# Patient Record
Sex: Female | Born: 1945 | Race: White | Hispanic: No | Marital: Married | Smoking: Never smoker
Health system: Southern US, Community
[De-identification: ages and names within clinical notes are randomized; demographics above are authoritative.]

## PROBLEM LIST (undated history)

## (undated) DIAGNOSIS — M779 Enthesopathy, unspecified: Secondary | ICD-10-CM

## (undated) DIAGNOSIS — K219 Gastro-esophageal reflux disease without esophagitis: Secondary | ICD-10-CM

## (undated) DIAGNOSIS — R011 Cardiac murmur, unspecified: Secondary | ICD-10-CM

## (undated) DIAGNOSIS — E785 Hyperlipidemia, unspecified: Secondary | ICD-10-CM

## (undated) DIAGNOSIS — M1990 Unspecified osteoarthritis, unspecified site: Secondary | ICD-10-CM

## (undated) DIAGNOSIS — I10 Essential (primary) hypertension: Secondary | ICD-10-CM

## (undated) DIAGNOSIS — Z5189 Encounter for other specified aftercare: Secondary | ICD-10-CM

## (undated) DIAGNOSIS — Z9189 Other specified personal risk factors, not elsewhere classified: Secondary | ICD-10-CM

## (undated) DIAGNOSIS — Z91B Personal risk factor of exposure to diethylstilbestrol: Secondary | ICD-10-CM

## (undated) DIAGNOSIS — E6609 Other obesity due to excess calories: Secondary | ICD-10-CM

## (undated) DIAGNOSIS — G709 Myoneural disorder, unspecified: Secondary | ICD-10-CM

## (undated) DIAGNOSIS — R519 Headache, unspecified: Secondary | ICD-10-CM

## (undated) DIAGNOSIS — T7840XA Allergy, unspecified, initial encounter: Secondary | ICD-10-CM

## (undated) DIAGNOSIS — Q221 Congenital pulmonary valve stenosis: Secondary | ICD-10-CM

## (undated) DIAGNOSIS — M8580 Other specified disorders of bone density and structure, unspecified site: Secondary | ICD-10-CM

## (undated) DIAGNOSIS — H269 Unspecified cataract: Secondary | ICD-10-CM

## (undated) HISTORY — DX: Gastro-esophageal reflux disease without esophagitis: K21.9

## (undated) HISTORY — DX: Enthesopathy, unspecified: M77.9

## (undated) HISTORY — PX: KNEE ARTHROSCOPY: SHX127

## (undated) HISTORY — DX: Myoneural disorder, unspecified: G70.9

## (undated) HISTORY — DX: Hyperlipidemia, unspecified: E78.5

## (undated) HISTORY — DX: Encounter for other specified aftercare: Z51.89

## (undated) HISTORY — PX: BREAST SURGERY: SHX581

## (undated) HISTORY — DX: Other obesity due to excess calories: E66.09

## (undated) HISTORY — DX: Personal risk factor of exposure to diethylstilbestrol: Z91.B

## (undated) HISTORY — DX: Allergy, unspecified, initial encounter: T78.40XA

## (undated) HISTORY — PX: FRACTURE SURGERY: SHX138

## (undated) HISTORY — DX: Essential (primary) hypertension: I10

## (undated) HISTORY — DX: Unspecified osteoarthritis, unspecified site: M19.90

## (undated) HISTORY — PX: CARDIAC CATHETERIZATION: SHX172

## (undated) HISTORY — PX: OOPHORECTOMY: SHX86

## (undated) HISTORY — PX: COSMETIC SURGERY: SHX468

## (undated) HISTORY — DX: Cardiac murmur, unspecified: R01.1

## (undated) HISTORY — DX: Other specified personal risk factors, not elsewhere classified: Z91.89

## (undated) HISTORY — DX: Unspecified cataract: H26.9

## (undated) HISTORY — PX: OTHER SURGICAL HISTORY: SHX169

## (undated) HISTORY — PX: APPENDECTOMY: SHX54

## (undated) HISTORY — DX: Congenital pulmonary valve stenosis: Q22.1

## (undated) HISTORY — DX: Other specified disorders of bone density and structure, unspecified site: M85.80

---

## 1981-02-07 HISTORY — PX: ABDOMINAL HYSTERECTOMY: SHX81

## 2006-01-15 ENCOUNTER — Ambulatory Visit: Payer: Self-pay | Admitting: Sports Medicine

## 2006-03-05 ENCOUNTER — Ambulatory Visit: Payer: Self-pay | Admitting: Sports Medicine

## 2006-06-08 ENCOUNTER — Ambulatory Visit: Payer: Self-pay | Admitting: Sports Medicine

## 2006-06-09 ENCOUNTER — Encounter: Admission: RE | Admit: 2006-06-09 | Discharge: 2006-06-09 | Payer: Self-pay | Admitting: Sports Medicine

## 2010-10-20 ENCOUNTER — Ambulatory Visit: Payer: Self-pay | Admitting: Cardiology

## 2010-10-28 ENCOUNTER — Encounter: Admission: RE | Admit: 2010-10-28 | Discharge: 2010-10-28 | Payer: Self-pay | Admitting: Cardiology

## 2010-10-28 ENCOUNTER — Encounter: Payer: Self-pay | Admitting: Cardiology

## 2010-10-28 ENCOUNTER — Ambulatory Visit (HOSPITAL_COMMUNITY): Admission: RE | Admit: 2010-10-28 | Discharge: 2010-10-28 | Payer: Self-pay | Admitting: Cardiology

## 2010-10-28 ENCOUNTER — Ambulatory Visit: Payer: Self-pay | Admitting: Cardiology

## 2010-10-28 ENCOUNTER — Ambulatory Visit: Payer: Self-pay

## 2010-10-28 DIAGNOSIS — Q221 Congenital pulmonary valve stenosis: Secondary | ICD-10-CM

## 2010-10-28 HISTORY — DX: Congenital pulmonary valve stenosis: Q22.1

## 2010-11-27 ENCOUNTER — Ambulatory Visit: Payer: Self-pay | Admitting: Cardiology

## 2011-08-14 ENCOUNTER — Encounter: Payer: Self-pay | Admitting: *Deleted

## 2011-08-17 ENCOUNTER — Ambulatory Visit (INDEPENDENT_AMBULATORY_CARE_PROVIDER_SITE_OTHER): Payer: BC Managed Care – PPO | Admitting: Cardiology

## 2011-08-17 ENCOUNTER — Encounter: Payer: Self-pay | Admitting: Cardiology

## 2011-08-17 VITALS — BP 130/78 | HR 78 | Wt 202.0 lb

## 2011-08-17 DIAGNOSIS — Q221 Congenital pulmonary valve stenosis: Secondary | ICD-10-CM

## 2011-08-17 DIAGNOSIS — N318 Other neuromuscular dysfunction of bladder: Secondary | ICD-10-CM

## 2011-08-17 DIAGNOSIS — I119 Hypertensive heart disease without heart failure: Secondary | ICD-10-CM

## 2011-08-17 DIAGNOSIS — E7800 Pure hypercholesterolemia, unspecified: Secondary | ICD-10-CM

## 2011-08-17 DIAGNOSIS — N3281 Overactive bladder: Secondary | ICD-10-CM

## 2011-08-17 MED ORDER — METOPROLOL TARTRATE 25 MG PO TABS: ORAL_TABLET | ORAL | Status: DC

## 2011-08-17 MED ORDER — TRIAMTERENE-HCTZ 37.5-25 MG PO CAPS: 1.0000 | ORAL_CAPSULE | ORAL | Status: DC

## 2011-08-17 NOTE — Assessment & Plan Note (Signed)
The patient is on Crestor 20 mg daily for her hypercholesterolemia.  She is not experiencing any myalgias or other side effects from the Crestor.

## 2011-08-17 NOTE — Assessment & Plan Note (Signed)
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  She denies chest pain or shortness of breath.  No dizziness or syncope.  No TIA symptoms.

## 2011-08-17 NOTE — Progress Notes (Signed)
Heather Hoover Date of Birth:  01-16-1946 [facility] [address]   [address] [place], [place]  [address] [phone]           Fax   [phone]  HPI: This pleasant 65 year old Caucasian female is seen for a scheduled followup office visit.  We were unable to locate Heather Hoover old office chart today.  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  She has been on triamterene and bystolicWhich have been working well but the bystolic is too expensive for Heather Hoover and she is requesting something less expensive.  She has not been having any chest pain or shortness of breath.  Current Outpatient Prescriptions  Medication Sig Dispense Refill  . aspirin 81 MG tablet Take 81 mg by mouth daily.        . Cholecalciferol (VITAMIN D-3 PO) Take by mouth. Taking 2000 daily       . Estradiol 10 MCG TABS Place vaginally. As directed       . Psyllium (METAMUCIL PO) Take by mouth.        . rosuvastatin (CRESTOR) 20 MG tablet Take 20 mg by mouth daily.        Marland Kitchen tolterodine (DETROL LA) 4 MG 24 hr capsule Take 4 mg by mouth daily.        Marland Kitchen triamterene-hydrochlorothiazide (DYAZIDE) 37.5-25 MG per capsule Take 1 capsule by mouth every morning.  90 capsule  3  . metoprolol tartrate (LOPRESSOR) 25 MG tablet Take one tablet daily  90 tablet  3    No Known Allergies  There is no problem list on file for this patient.   History  Smoking status  . Never Smoker   Smokeless tobacco  . Not on file    History  Alcohol Use: Not on file    No family history on file.  Review of Systems: The patient denies any heat or cold intolerance.  No weight gain or weight loss.  The patient denies headaches or blurry vision.  There is no cough or sputum production.  The patient denies dizziness.  There is no hematuria or hematochezia.  The patient denies any muscle aches or arthritis.  The patient denies any rash.  The patient denies frequent  falling or instability.  There is no history of depression or anxiety.  All other systems were reviewed and are negative.   Physical Exam: Filed Vitals:   08/17/11 1406  BP: 130/78  Pulse: 78   The general appearance reveals a well-developed well-nourished woman in no distress.Pupils equal and reactive.   Extraocular Movements are full.  There is no scleral icterus.  The mouth and pharynx are normal.  The neck is supple.  The carotids reveal no bruits.  The jugular venous pressure is normal.  The thyroid is not enlarged.  There is no lymphadenopathy.  The chest is clear to percussion and auscultation. There are no rales or rhonchi. Expansion of the chest is symmetrical.    Heart reveals a grade 2/6 systolic ejection murmur across the pulmonic valve.  No diastolic murmur.The abdomen is soft and nontender. Bowel sounds are normal. The liver and spleen are not enlarged. There Are no abdominal masses. There are no bruits.  The pedal pulses are good.  There is no phlebitis or edema.  There is no cyanosis or clubbing.  Strength is normal and symmetrical in all extremities.  There is no lateralizing weakness.  There are no  sensory deficits.     Assessment / Plan: Began metoprolol tartrate 25 mg one daily.  Continue generic Maxide.  Recheck in 6 months for a followup office visit and fasting lipid panel hepatic function panel and basal metabolic.  Continue prudent heart healthy diet and weight loss.

## 2012-02-11 ENCOUNTER — Ambulatory Visit (INDEPENDENT_AMBULATORY_CARE_PROVIDER_SITE_OTHER): Payer: BC Managed Care – PPO | Admitting: *Deleted

## 2012-02-11 DIAGNOSIS — I119 Hypertensive heart disease without heart failure: Secondary | ICD-10-CM

## 2012-02-11 DIAGNOSIS — E7800 Pure hypercholesterolemia, unspecified: Secondary | ICD-10-CM

## 2012-02-11 LAB — HEPATIC FUNCTION PANEL
ALT: 24 U/L (ref 0–35)
Albumin: 4.3 g/dL (ref 3.5–5.2)
Alkaline Phosphatase: 64 U/L (ref 39–117)
Bilirubin, Direct: 0 mg/dL (ref 0.0–0.3)
Total Protein: 7.6 g/dL (ref 6.0–8.3)

## 2012-02-11 LAB — BASIC METABOLIC PANEL
BUN: 17 mg/dL (ref 6–23)
Calcium: 9.5 mg/dL (ref 8.4–10.5)
Creatinine, Ser: 0.8 mg/dL (ref 0.4–1.2)
GFR: 75.5 mL/min (ref >60.00)

## 2012-02-11 LAB — LIPID PANEL
Cholesterol: 182 mg/dL (ref 0–200)
Triglycerides: 150 mg/dL — ABNORMAL HIGH (ref 0.0–149.0)

## 2012-02-11 NOTE — Progress Notes (Signed)
Quick Note:  Please make copy of labs for patient visit. ______ 

## 2012-02-12 ENCOUNTER — Encounter: Payer: Self-pay | Admitting: *Deleted

## 2012-02-12 ENCOUNTER — Other Ambulatory Visit: Payer: BC Managed Care – PPO

## 2012-02-12 DIAGNOSIS — E6609 Other obesity due to excess calories: Secondary | ICD-10-CM | POA: Insufficient documentation

## 2012-02-12 DIAGNOSIS — M1990 Unspecified osteoarthritis, unspecified site: Secondary | ICD-10-CM | POA: Insufficient documentation

## 2012-02-12 DIAGNOSIS — E785 Hyperlipidemia, unspecified: Secondary | ICD-10-CM | POA: Insufficient documentation

## 2012-02-12 DIAGNOSIS — Q221 Congenital pulmonary valve stenosis: Secondary | ICD-10-CM | POA: Insufficient documentation

## 2012-02-16 ENCOUNTER — Encounter: Payer: Self-pay | Admitting: Cardiology

## 2012-02-16 ENCOUNTER — Ambulatory Visit (INDEPENDENT_AMBULATORY_CARE_PROVIDER_SITE_OTHER): Payer: BC Managed Care – PPO | Admitting: Cardiology

## 2012-02-16 VITALS — BP 138/80 | HR 78 | Ht 66.0 in | Wt 196.0 lb

## 2012-02-16 DIAGNOSIS — I119 Hypertensive heart disease without heart failure: Secondary | ICD-10-CM

## 2012-02-16 DIAGNOSIS — Q221 Congenital pulmonary valve stenosis: Secondary | ICD-10-CM

## 2012-02-16 DIAGNOSIS — E7800 Pure hypercholesterolemia, unspecified: Secondary | ICD-10-CM

## 2012-02-16 MED ORDER — ROSUVASTATIN CALCIUM 20 MG PO TABS: 20.0000 mg | ORAL_TABLET | Freq: Every day | ORAL | Status: DC

## 2012-02-16 NOTE — Assessment & Plan Note (Signed)
The patient has not been expressing any symptoms from her pulmonic stenosis.  She has not been having any signs or symptoms of right heart failure.

## 2012-02-16 NOTE — Progress Notes (Signed)
Beatris Ship Date of Birth:  [birth year] [facility] [address] [place], [place]  [address] [phone]  Fax   [phone]  HPI: This pleasant 66 year old woman is seen for a scheduled followup office visit.  She has a history of congenital pulmonic stenosis.  She has a history of blood pressure and high cholesterol.  Since last visit she has been feeling well.  Current Outpatient Prescriptions  Medication Sig Dispense Refill  . aspirin 81 MG tablet Take 81 mg by mouth daily.        . Cholecalciferol (VITAMIN D-3 PO) Take by mouth. Taking 2000 daily       . Estradiol 10 MCG TABS Place vaginally. As directed       . metoprolol tartrate (LOPRESSOR) 25 MG tablet Take one tablet daily  90 tablet  3  . Psyllium (METAMUCIL PO) Take by mouth.        . rosuvastatin (CRESTOR) 20 MG tablet Take 1 tablet (20 mg total) by mouth daily.  90 tablet  3  . tolterodine (DETROL LA) 4 MG 24 hr capsule Take 4 mg by mouth daily.        Marland Kitchen triamterene-hydrochlorothiazide (DYAZIDE) 37.5-25 MG per capsule Take 1 capsule by mouth every morning.  90 capsule  3    Allergies  Allergen Reactions  . Advicor     Flushing,itching,syncope    Patient Active Problem List  Diagnoses  . Pulmonic stenosis, congenital  . Hypercholesterolemia  . Benign hypertensive heart disease without heart failure  . 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  . Exogenous obesity  . Dyslipidemia  . Arthritis    History  Smoking status  . Never Smoker   Smokeless tobacco  . Not on file    History  Alcohol Use: Not on file    No family history on file.  Review of Systems: The patient denies any heat or cold intolerance.  No weight gain or weight loss.  The patient denies headaches or blurry vision.  There is no cough or sputum production.  The patient denies dizziness.  There is no hematuria or hematochezia.  The patient denies any muscle aches or arthritis.  The patient denies any  rash.  The patient denies frequent falling or instability.  There is no history of depression or anxiety.  All other systems were reviewed and are negative.   Physical Exam: Filed Vitals:   02/16/12 0939  BP: 138/80  Pulse: 78   the general appearance reveals a well-developed well-nourished woman in no distress.Pupils equal and reactive.   Extraocular Movements are full.  There is no scleral icterus.  The mouth and pharynx are normal.  The neck is supple.  The carotids reveal no bruits.  The jugular venous pressure is normal.  The thyroid is not enlarged.  There is no lymphadenopathy.  The chest is clear to percussion and auscultation. There are no rales or rhonchi. Expansion of the chest is symmetrical.  Heart reveals a soft systolic ejection murmur with a ejection click along the pulmonic area.  No gallop or rub.The abdomen is soft and nontender. Bowel sounds are normal. The liver and spleen are not enlarged. There Are no abdominal masses. There are no bruits.  The pedal pulses are good.  There is no phlebitis or edema.  There is no cyanosis or clubbing. Neurologic is no abnormality.The skin is warm and dry.  There is no rash.     Assessment / Plan: Continue same medication.  Recheck  in 6 months for followup office visit EKG and fasting lipid panel and chemistries.  Her weight is down 6 pounds since last visit I encouraged her to continue to lose weight through careful dieting

## 2012-02-16 NOTE — Patient Instructions (Signed)
Your physician wants you to follow-up in:  6 months. You will receive a reminder letter in the mail two months in advance. If you don't receive a letter, please call our office to schedule the follow-up appointment.   

## 2012-02-16 NOTE — Assessment & Plan Note (Signed)
Patient is on Crestor for her hypercholesterolemia.  She is not having any symptoms or myalgias.  We are refilling her Crestor today.  We reviewed her recent lab work which is satisfactory including normal liver function studies

## 2012-04-14 ENCOUNTER — Ambulatory Visit: Payer: BC Managed Care – PPO | Admitting: Gynecology

## 2012-04-18 ENCOUNTER — Ambulatory Visit (INDEPENDENT_AMBULATORY_CARE_PROVIDER_SITE_OTHER): Payer: BC Managed Care – PPO | Admitting: Gynecology

## 2012-04-18 ENCOUNTER — Encounter: Payer: Self-pay | Admitting: Gynecology

## 2012-04-18 VITALS — BP 158/80 | Ht 65.75 in | Wt 198.0 lb

## 2012-04-18 DIAGNOSIS — Z780 Asymptomatic menopausal state: Secondary | ICD-10-CM

## 2012-04-18 DIAGNOSIS — N318 Other neuromuscular dysfunction of bladder: Secondary | ICD-10-CM

## 2012-04-18 DIAGNOSIS — N952 Postmenopausal atrophic vaginitis: Secondary | ICD-10-CM

## 2012-04-18 DIAGNOSIS — Z01419 Encounter for gynecological examination (general) (routine) without abnormal findings: Secondary | ICD-10-CM

## 2012-04-18 DIAGNOSIS — N3941 Urge incontinence: Secondary | ICD-10-CM

## 2012-04-18 DIAGNOSIS — N3281 Overactive bladder: Secondary | ICD-10-CM

## 2012-04-18 MED ORDER — TOLTERODINE TARTRATE ER 4 MG PO CP24: 4.0000 mg | ORAL_CAPSULE | Freq: Every day | ORAL | Status: DC

## 2012-04-18 MED ORDER — ESTRADIOL 10 MCG VA TABS: 10.0000 ug | ORAL_TABLET | VAGINAL | Status: DC

## 2012-04-18 NOTE — Progress Notes (Signed)
Heather Hoover [birth year] [id number]        66 y.o.  New patient for annual exam.  Former patient of Dr. Kyra Manges. Several issues noted below.  Past medical history,surgical history, medications, allergies, family history and social history were all reviewed and documented in the EPIC chart. ROS:  Was performed and pertinent positives and negatives are included in the history.  Exam: Sherrilyn Rist chaperone present Filed Vitals:   04/18/12 0930  BP: 158/80   General appearance  Normal Skin grossly normal Head/Neck normal with no cervical or supraclavicular adenopathy thyroid normal Lungs  clear Cardiac RR, without 2/6 systolic ejection murmur at left sternal border Abdominal  soft, nontender, without masses, organomegaly or hernia Breasts  examined lying and sitting without masses, retractions, discharge or axillary adenopathy. Pelvic  Ext/BUS/vagina  normal with atrophic genital changes  Adnexa  Without masses or tenderness    Anus and perineum  normal   Rectovaginal  normal sphincter tone without palpated masses or tenderness.    Assessment/Plan:  66 y.o. female for annual exam.    1. Atrophic vaginitis. Patients on Vagifem and has been on this for a number of years. She's doing well with this and wants a refill. I reviewed the issues of absorption and the risks to include stroke heart attack DVT possible breast cancer issue. She does have a history of pulmonic stenosis followed by Dr. [name] and being on this is not an issue per her history. I refilled her Vagifem 10 mcg x1 year. 2. Urge incontinence. Patients on Detrol 4 mg daily. She's done well with this and wants to continue. She has a history consistent with urge symptoms. Will check UA today and refill her Detrol times a year. 3. History endometriosis/endometrioma status post TAH RSO. Has done well since surgery. 4. Pap smear. She had a Pap smear 2012 to include high risk HPV screen which was negative. I reviewed current  screening guidelines to include stopping at age 85 as well as stopping with hysterectomy. She has no history of significant abnormal Pap smears in the past we will plan on stopping Pap smears. 5. Mammography. Had mammogram August 2012. We'll continue with annual mammography. SBE monthly reviewed. 6. Bone health. Had DEXA over 10 years ago. We'll repeat now and she will schedule this. Increase calcium vitamin D reviewed. 7. Colonoscopy. She had her colonoscopy several years ago and is due to repeat at a 10 year interval. 8. Health maintenance. No blood work was done today this is all done through her primary physician's office and Dr. Yevonne Pax office who follows her for her medical issues. Her blood pressure was elevated today which I alerted her to and she states it always is when she sees her physician. Assuming she continues well from a gynecologic standpoint she will see me in a year, sooner as needed.    Dara Lords MD, 10:09 AM 04/18/2012

## 2012-04-18 NOTE — Patient Instructions (Signed)
Followup for bone density as scheduled. 

## 2012-04-19 LAB — URINALYSIS W MICROSCOPIC + REFLEX CULTURE
Bilirubin Urine: NEGATIVE
Casts: NONE SEEN
Crystals: NONE SEEN
Glucose, UA: NEGATIVE mg/dL
Specific Gravity, Urine: 1.02 (ref 1.005–1.030)
Squamous Epithelial / LPF: NONE SEEN
Urobilinogen, UA: 0.2 mg/dL (ref 0.0–1.0)
pH: 5.5 (ref 5.0–8.0)

## 2012-06-02 ENCOUNTER — Ambulatory Visit: Payer: BC Managed Care – PPO

## 2012-06-28 ENCOUNTER — Other Ambulatory Visit: Payer: Self-pay | Admitting: Cardiology

## 2012-06-30 ENCOUNTER — Ambulatory Visit (INDEPENDENT_AMBULATORY_CARE_PROVIDER_SITE_OTHER): Payer: Medicare Other

## 2012-06-30 ENCOUNTER — Encounter: Payer: Self-pay | Admitting: Gynecology

## 2012-06-30 DIAGNOSIS — M899 Disorder of bone, unspecified: Secondary | ICD-10-CM

## 2012-08-17 ENCOUNTER — Other Ambulatory Visit (INDEPENDENT_AMBULATORY_CARE_PROVIDER_SITE_OTHER): Payer: Medicare Other

## 2012-08-17 DIAGNOSIS — I119 Hypertensive heart disease without heart failure: Secondary | ICD-10-CM

## 2012-08-17 DIAGNOSIS — E7800 Pure hypercholesterolemia, unspecified: Secondary | ICD-10-CM

## 2012-08-17 LAB — LIPID PANEL
HDL: 57.2 mg/dL (ref >39.00)
Total CHOL/HDL Ratio: 3

## 2012-08-17 LAB — HEPATIC FUNCTION PANEL
AST: 23 U/L (ref 0–37)
Bilirubin, Direct: 0.1 mg/dL (ref 0.0–0.3)
Total Bilirubin: 0.6 mg/dL (ref 0.3–1.2)

## 2012-08-17 LAB — BASIC METABOLIC PANEL
CO2: 29 mEq/L (ref 19–32)
Calcium: 9.3 mg/dL (ref 8.4–10.5)
Glucose, Bld: 94 mg/dL (ref 70–99)
Potassium: 3.8 mEq/L (ref 3.5–5.1)
Sodium: 138 mEq/L (ref 135–145)

## 2012-08-17 NOTE — Progress Notes (Signed)
Quick Note:  Please make copy of labs for patient visit. ______ 

## 2012-08-19 ENCOUNTER — Encounter: Payer: Self-pay | Admitting: Gynecology

## 2012-08-23 ENCOUNTER — Ambulatory Visit (INDEPENDENT_AMBULATORY_CARE_PROVIDER_SITE_OTHER): Payer: Medicare Other | Admitting: Cardiology

## 2012-08-23 ENCOUNTER — Encounter: Payer: Self-pay | Admitting: Cardiology

## 2012-08-23 VITALS — BP 162/90 | HR 91 | Ht 66.0 in | Wt 195.8 lb

## 2012-08-23 DIAGNOSIS — I119 Hypertensive heart disease without heart failure: Secondary | ICD-10-CM

## 2012-08-23 DIAGNOSIS — E785 Hyperlipidemia, unspecified: Secondary | ICD-10-CM

## 2012-08-23 DIAGNOSIS — Q221 Congenital pulmonary valve stenosis: Secondary | ICD-10-CM

## 2012-08-23 DIAGNOSIS — E7800 Pure hypercholesterolemia, unspecified: Secondary | ICD-10-CM

## 2012-08-23 MED ORDER — METOPROLOL TARTRATE 25 MG PO TABS: 25.0000 mg | ORAL_TABLET | Freq: Every day | ORAL | Status: DC

## 2012-08-23 MED ORDER — ROSUVASTATIN CALCIUM 20 MG PO TABS: 20.0000 mg | ORAL_TABLET | Freq: Every day | ORAL | Status: DC

## 2012-08-23 MED ORDER — TRIAMTERENE-HCTZ 37.5-25 MG PO CAPS: 1.0000 | ORAL_CAPSULE | ORAL | Status: DC

## 2012-08-23 NOTE — Progress Notes (Signed)
Heather Hoover Date of Birth:  1946/06/05 [facility] [address] [place], [place]  [address] [phone]  Fax   [phone]  HPI: This pleasant 66 year old woman is seen for a scheduled followup office visit. She has a history of congenital pulmonic stenosis. She has a history of blood pressure and high cholesterol. Since last visit she has been feeling well.  She has not been having chest pain or shortness of breath.  She has occasional migraine headaches.  She has had some tingling of her left hand and left arm which occurs most nights.  She does not notice it during the day.  It may be related to her mattress.  She recently took a trip to [place] and slept on a hotel mattress and did not have any problems with her neck or her left arm and hand.   Current Outpatient Prescriptions  Medication Sig Dispense Refill  . aspirin 81 MG tablet Take 81 mg by mouth daily.        . Cholecalciferol (VITAMIN D-3 PO) Take by mouth. Taking 2000 daily       . Estradiol (VAGIFEM) 10 MCG TABS Place 1 tablet (10 mcg total) vaginally 2 (two) times a week.  8 tablet  11  . metoprolol tartrate (LOPRESSOR) 25 MG tablet Take 1 tablet (25 mg total) by mouth daily.  90 tablet  3  . polycarbophil (FIBERCON) 625 MG tablet Take 625 mg by mouth daily.      . rosuvastatin (CRESTOR) 20 MG tablet Take 1 tablet (20 mg total) by mouth daily.  90 tablet  3  . tolterodine (DETROL LA) 4 MG 24 hr capsule Take 1 capsule (4 mg total) by mouth daily.  30 capsule  11  . triamterene-hydrochlorothiazide (DYAZIDE) 37.5-25 MG per capsule Take 1 each (1 capsule total) by mouth every morning.  90 capsule  3  . DISCONTD: metoprolol tartrate (LOPRESSOR) 25 MG tablet TAKE 1 TABLET DAILY  90 tablet  2  . DISCONTD: rosuvastatin (CRESTOR) 20 MG tablet Take 1 tablet (20 mg total) by mouth daily.  90 tablet  3  . DISCONTD: triamterene-hydrochlorothiazide (DYAZIDE) 37.5-25 MG per capsule Take 1 capsule by mouth  every morning.  90 capsule  3    Allergies  Allergen Reactions  . Niacin-Lovastatin Er     Flushing,itching,syncope    Patient Active Problem List  Diagnosis  . Pulmonic stenosis, congenital  . Hypercholesterolemia  . Benign hypertensive heart disease without heart failure  . 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  . Exogenous obesity  . Dyslipidemia  . Arthritis    History  Smoking status  . Never Smoker   Smokeless tobacco  . Never Used    History  Alcohol Use  . Yes    rarely    Family History  Problem Relation Age of Onset  . Hypertension Mother   . Breast cancer Mother 51    Breast   . Cancer Mother     uterine thye think  . Heart disease Mother   . Cancer Father 41    prostate metastized to bone  . Hypertension Sister   . Breast cancer Sister   . Cancer Sister 53    uterine    Review of Systems: The patient denies any heat or cold intolerance.  No weight gain or weight loss.  The patient denies headaches or blurry vision.  There is no cough or sputum production.  The patient denies dizziness.  There is  no hematuria or hematochezia.  The patient denies any muscle aches or arthritis.  The patient denies any rash.  The patient denies frequent falling or instability.  There is no history of depression or anxiety.  All other systems were reviewed and are negative.   Physical Exam: Filed Vitals:   08/23/12 1542  BP: 162/90  Pulse: 91   the general appearance reveals a well-developed well-nourished woman in no distress.The head and neck exam reveals pupils equal and reactive.  Extraocular movements are full.  There is no scleral icterus.  The mouth and pharynx are normal.  The neck is supple.  The carotids reveal no bruits.  The jugular venous pressure is normal.  The  thyroid is not enlarged.  There is no lymphadenopathy.  The chest is clear to percussion and auscultation.  There are no rales or rhonchi.  Expansion of the chest is symmetrical.  The  precordium is quiet.  The first heart sound is normal.  The second heart sound is physiologically split.  There is a systolic ejection click followed by a grade 2/6 systolic ejection murmur at the left sternal edge.  There is no abnormal lift or heave.  The abdomen is soft and nontender.  The bowel sounds are normal.  The liver and spleen are not enlarged.  There are no abdominal masses.  There are no abdominal bruits.  Extremities reveal good pedal pulses.  There is no phlebitis or edema.  There is no cyanosis or clubbing.  Strength is normal and symmetrical in all extremities.  There is no lateralizing weakness.  There are no sensory deficits.  The skin is warm and dry.  There is no rash.  EKG today shows normal sinus rhythm and is within normal limits.  No evidence of right heart strain   Assessment / Plan: Continue same medication.  Continue low-cholesterol diet.  Recheck in 6 months for followup office visit fasting lipid panel hepatic function panel and basal metabolic panel.

## 2012-08-23 NOTE — Assessment & Plan Note (Signed)
The patient is not having any symptoms from her pulmonic stenosis.  No symptoms of right heart failure.

## 2012-08-23 NOTE — Assessment & Plan Note (Signed)
Her blood pressure here in the office today is elevated at 140/88 on repeat.  She brought in a list of her blood pressures from home which are excellent.  She will continue same blood pressure medication

## 2012-08-23 NOTE — Assessment & Plan Note (Signed)
The patient has a history of hypercholesterolemia.  Her husband was recently diagnosed with high cholesterol and did not want to go on medication and so he has cut out all cheese from his diet and also from his wife's diet.  As a result her weight is down and her lipids are improved

## 2012-08-23 NOTE — Patient Instructions (Signed)
Your physician recommends that you continue on your current medications as directed. Please refer to the Current Medication list given to you today.  Your physician wants you to follow-up in: 6 month. You will receive a reminder letter in the mail two months in advance. If you don't receive a letter, please call our office to schedule the follow-up appointment.  

## 2012-09-22 ENCOUNTER — Encounter: Payer: Self-pay | Admitting: Gynecology

## 2013-01-03 ENCOUNTER — Other Ambulatory Visit: Payer: Self-pay | Admitting: Family Medicine

## 2013-01-03 DIAGNOSIS — M5412 Radiculopathy, cervical region: Secondary | ICD-10-CM

## 2013-01-07 ENCOUNTER — Ambulatory Visit
Admission: RE | Admit: 2013-01-07 | Discharge: 2013-01-07 | Disposition: A | Discharge status: Home / Self Care | Payer: Medicare PPO | Source: Ambulatory Visit | Attending: Family Medicine | Admitting: Family Medicine

## 2013-01-07 DIAGNOSIS — M5412 Radiculopathy, cervical region: Secondary | ICD-10-CM

## 2013-02-21 ENCOUNTER — Other Ambulatory Visit: Payer: Medicare PPO

## 2013-02-22 ENCOUNTER — Other Ambulatory Visit: Payer: Medicare PPO

## 2013-02-24 ENCOUNTER — Ambulatory Visit: Payer: Medicare PPO | Admitting: Cardiology

## 2013-02-27 ENCOUNTER — Encounter: Payer: Self-pay | Admitting: Gynecology

## 2013-03-06 ENCOUNTER — Ambulatory Visit: Payer: Medicare PPO | Admitting: Cardiology

## 2013-03-15 ENCOUNTER — Other Ambulatory Visit (INDEPENDENT_AMBULATORY_CARE_PROVIDER_SITE_OTHER): Payer: Medicare PPO

## 2013-03-15 DIAGNOSIS — I119 Hypertensive heart disease without heart failure: Secondary | ICD-10-CM

## 2013-03-15 DIAGNOSIS — E7800 Pure hypercholesterolemia, unspecified: Secondary | ICD-10-CM

## 2013-03-15 LAB — HEPATIC FUNCTION PANEL
AST: 27 U/L (ref 0–37)
Albumin: 4.2 g/dL (ref 3.5–5.2)
Alkaline Phosphatase: 58 U/L (ref 39–117)
Bilirubin, Direct: 0 mg/dL (ref 0.0–0.3)
Total Protein: 7.4 g/dL (ref 6.0–8.3)

## 2013-03-15 LAB — BASIC METABOLIC PANEL
BUN: 14 mg/dL (ref 6–23)
CO2: 31 mEq/L (ref 19–32)
Calcium: 9.5 mg/dL (ref 8.4–10.5)
Creatinine, Ser: 0.7 mg/dL (ref 0.4–1.2)

## 2013-03-15 LAB — LIPID PANEL
Cholesterol: 167 mg/dL (ref 0–200)
Triglycerides: 141 mg/dL (ref 0.0–149.0)

## 2013-03-16 NOTE — Progress Notes (Signed)
Quick Note:  Please make copy of labs for patient visit. ______ 

## 2013-03-17 ENCOUNTER — Ambulatory Visit (INDEPENDENT_AMBULATORY_CARE_PROVIDER_SITE_OTHER): Payer: Medicare PPO | Admitting: Cardiology

## 2013-03-17 ENCOUNTER — Encounter: Payer: Self-pay | Admitting: Cardiology

## 2013-03-17 VITALS — BP 148/76 | HR 77 | Ht 66.0 in | Wt 196.4 lb

## 2013-03-17 DIAGNOSIS — I119 Hypertensive heart disease without heart failure: Secondary | ICD-10-CM

## 2013-03-17 DIAGNOSIS — Q221 Congenital pulmonary valve stenosis: Secondary | ICD-10-CM

## 2013-03-17 DIAGNOSIS — E7800 Pure hypercholesterolemia, unspecified: Secondary | ICD-10-CM

## 2013-03-17 DIAGNOSIS — E876 Hypokalemia: Secondary | ICD-10-CM

## 2013-03-17 MED ORDER — METOPROLOL TARTRATE 25 MG PO TABS: 25.0000 mg | ORAL_TABLET | Freq: Every day | ORAL | Status: DC

## 2013-03-17 MED ORDER — HYDROCHLOROTHIAZIDE 25 MG PO TABS: 25.0000 mg | ORAL_TABLET | Freq: Every day | ORAL | Status: DC

## 2013-03-17 MED ORDER — POTASSIUM CHLORIDE ER 10 MEQ PO TBCR: 10.0000 meq | EXTENDED_RELEASE_TABLET | Freq: Every day | ORAL | Status: DC

## 2013-03-17 MED ORDER — ROSUVASTATIN CALCIUM 20 MG PO TABS: 20.0000 mg | ORAL_TABLET | Freq: Every day | ORAL | Status: DC

## 2013-03-17 NOTE — Patient Instructions (Addendum)
START POTASSIUM 10 MEQ DAILY, RX SENT TO [facility]  RETURN IN 2 WEEKS FOR LABS   Your physician wants you to follow-up in: 6 months with fasting labs (lp/bmet/hfp) AND EKG  You will receive a reminder letter in the mail two months in advance. If you don't receive a letter, please call our office to schedule the follow-up appointment.

## 2013-03-17 NOTE — Assessment & Plan Note (Signed)
Blood pressure has been satisfactory but recent lab work shows that she is hypokalemic again.  She presently is on hydrochlorothiazide for blood pressure.  We will add KCL 10 mEq one daily to her regimen.

## 2013-03-17 NOTE — Progress Notes (Signed)
Heather Hoover Date of Birth:  05-01-1946 [facility] [address] [place], [place]  [address] [phone]         Fax   [phone]  History of Present Illness: This pleasant 67 year old woman is seen for a scheduled followup office visit. She has a history of congenital pulmonic stenosis. She has a history of high blood pressure and high cholesterol. Since last visit she has been feeling well. She has not been having chest pain or shortness of breath. She has occasional migraine headaches. She has had some tingling of her left hand and left arm which occurs most nights. She does not notice it during the day. It may be related to her mattress.  She eventually had an MRI which showed bone spurs on her spine.  She had a trial of Celebrex which did not help much.  The discomfort tends to wax and wane on its own and right now she is not having much problem with it.   Current Outpatient Prescriptions  Medication Sig Dispense Refill  . aspirin 81 MG tablet Take 81 mg by mouth daily.        . Cholecalciferol (VITAMIN D-3 PO) Take by mouth. Taking 2000 daily       . Estradiol (VAGIFEM) 10 MCG TABS Place 1 tablet (10 mcg total) vaginally 2 (two) times a week.  8 tablet  11  . hydrochlorothiazide (HYDRODIURIL) 25 MG tablet Take 1 tablet (25 mg total) by mouth daily.  90 tablet  3  . metoprolol tartrate (LOPRESSOR) 25 MG tablet Take 1 tablet (25 mg total) by mouth daily.  90 tablet  3  . polycarbophil (FIBERCON) 625 MG tablet Take 625 mg by mouth daily.      . rosuvastatin (CRESTOR) 20 MG tablet Take 1 tablet (20 mg total) by mouth daily.  90 tablet  3  . tolterodine (DETROL LA) 4 MG 24 hr capsule Take 1 capsule (4 mg total) by mouth daily.  30 capsule  11  . potassium chloride (K-DUR) 10 MEQ tablet Take 1 tablet (10 mEq total) by mouth daily.  90 tablet  3   No current facility-administered medications for this visit.    Allergies  Allergen Reactions  .  Niacin-Lovastatin Er     Flushing,itching,syncope    Patient Active Problem List  Diagnosis  . Pulmonic stenosis, congenital  . Hypercholesterolemia  . Benign hypertensive heart disease without heart failure  . 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  . Exogenous obesity  . Dyslipidemia  . Arthritis    History  Smoking status  . Never Smoker   Smokeless tobacco  . Never Used    History  Alcohol Use  . Yes    Comment: rarely    Family History  Problem Relation Age of Onset  . Hypertension Mother   . Breast cancer Mother 75    Breast   . Cancer Mother     uterine thye think  . Heart disease Mother   . Cancer Father 28    prostate metastized to bone  . Hypertension Sister   . Breast cancer Sister   . Cancer Sister 34    uterine    Review of Systems: Constitutional: no fever chills diaphoresis or fatigue or change in weight.  Head and neck: no hearing loss, no epistaxis, no photophobia or visual disturbance. Respiratory: No cough, shortness of breath or wheezing. Cardiovascular: No chest pain peripheral edema, palpitations. Gastrointestinal: No abdominal distention, no abdominal pain, no  change in bowel habits hematochezia or melena. Genitourinary: No dysuria, no frequency, no urgency, no nocturia. Musculoskeletal:No arthralgias, no back pain, no gait disturbance or myalgias. Neurological: No dizziness, no headaches, no numbness, no seizures, no syncope, no weakness, no tremors. Hematologic: No lymphadenopathy, no easy bruising. Psychiatric: No confusion, no hallucinations, no sleep disturbance.    Physical Exam: Filed Vitals:   03/17/13 1438  BP: 148/76  Pulse: 77   the general appearance reveals a well-developed well-nourished woman in no distress.The head and neck exam reveals pupils equal and reactive.  Extraocular movements are full.  There is no scleral icterus.  The mouth and pharynx are normal.  The neck is supple.  The carotids reveal no  bruits.  The jugular venous pressure is normal.  The  thyroid is not enlarged.  There is no lymphadenopathy.  The chest is clear to percussion and auscultation.  There are no rales or rhonchi.  Expansion of the chest is symmetrical.  The precordium is quiet.  The first heart sound is normal.  The second heart sound is physiologically split.  There is no  gallop rub or click.  There is a grade 2/6 systolic ejection murmur across the pulmonic valve  There is no abnormal lift or heave.  The abdomen is soft and nontender.  The bowel sounds are normal.  The liver and spleen are not enlarged.  There are no abdominal masses.  There are no abdominal bruits.  Extremities reveal good pedal pulses.  There is no phlebitis or edema.  There is no cyanosis or clubbing.  Strength is normal and symmetrical in all extremities.  There is no lateralizing weakness.  There are no sensory deficits.  The skin is warm and dry.  There is no rash.     Assessment / Plan: Her blood pressure is slightly high he was in the office today but at home her blood pressures have been normal and she brought in a short of her home blood pressures showing blood pressures in the range of 130/75. Continue same medication.  At potassium chloride 10 mEq daily.  Recheck in 2 weeks for a followup BMET and recheck in 6 months for office visit EKG and fasting lab work.

## 2013-03-17 NOTE — Assessment & Plan Note (Signed)
The patient is not having any symptoms referable to her mild pulmonic stenosis.  She exercises regularly at the [facility] where she walks on treadmill and does the exercise machines.

## 2013-03-17 NOTE — Assessment & Plan Note (Signed)
Patient has a history of hypercholesterolemia.  Recent lab work is satisfactory.  She is on Crestor which read refill today.  She is not having any side effects from the Crestor.

## 2013-03-31 ENCOUNTER — Other Ambulatory Visit (INDEPENDENT_AMBULATORY_CARE_PROVIDER_SITE_OTHER): Payer: Medicare PPO

## 2013-03-31 DIAGNOSIS — E7800 Pure hypercholesterolemia, unspecified: Secondary | ICD-10-CM

## 2013-03-31 DIAGNOSIS — E876 Hypokalemia: Secondary | ICD-10-CM

## 2013-03-31 LAB — BASIC METABOLIC PANEL
CO2: 29 mEq/L (ref 19–32)
Calcium: 9.1 mg/dL (ref 8.4–10.5)
Creatinine, Ser: 0.6 mg/dL (ref 0.4–1.2)
GFR: 102.43 mL/min (ref >60.00)
Sodium: 134 mEq/L — ABNORMAL LOW (ref 135–145)

## 2013-03-31 NOTE — Progress Notes (Signed)
Quick Note:  Please report to patient. The recent labs are stable. Continue same medication and careful diet. Potassium better. CSD ______

## 2013-04-03 ENCOUNTER — Telehealth: Payer: Self-pay | Admitting: *Deleted

## 2013-04-03 NOTE — Telephone Encounter (Signed)
Advised patient of lab results  

## 2013-04-03 NOTE — Telephone Encounter (Signed)
Message copied by Burnell Blanks on Mon Apr 03, 2013  8:50 AM ------      Message from: Cassell Clement      Created: Fri Mar 31, 2013  1:46 PM       Please report to patient.  The recent labs are stable. Continue same medication and careful diet. Potassium better. CSD ------

## 2013-04-10 ENCOUNTER — Other Ambulatory Visit: Payer: Self-pay

## 2013-04-10 MED ORDER — ESTRADIOL 10 MCG VA TABS: 10.0000 ug | ORAL_TABLET | VAGINAL | Status: DC

## 2013-04-27 ENCOUNTER — Encounter: Payer: Self-pay | Admitting: Gynecology

## 2013-04-27 ENCOUNTER — Ambulatory Visit (INDEPENDENT_AMBULATORY_CARE_PROVIDER_SITE_OTHER): Payer: Medicare PPO | Admitting: Gynecology

## 2013-04-27 VITALS — BP 134/80 | Ht 65.5 in | Wt 190.0 lb

## 2013-04-27 DIAGNOSIS — N3281 Overactive bladder: Secondary | ICD-10-CM

## 2013-04-27 DIAGNOSIS — N318 Other neuromuscular dysfunction of bladder: Secondary | ICD-10-CM

## 2013-04-27 DIAGNOSIS — M8580 Other specified disorders of bone density and structure, unspecified site: Secondary | ICD-10-CM

## 2013-04-27 DIAGNOSIS — M949 Disorder of cartilage, unspecified: Secondary | ICD-10-CM

## 2013-04-27 DIAGNOSIS — N952 Postmenopausal atrophic vaginitis: Secondary | ICD-10-CM

## 2013-04-27 MED ORDER — TOLTERODINE TARTRATE ER 4 MG PO CP24: 4.0000 mg | ORAL_CAPSULE | Freq: Every day | ORAL | Status: DC

## 2013-04-27 MED ORDER — ESTRADIOL 10 MCG VA TABS: 10.0000 ug | ORAL_TABLET | VAGINAL | Status: DC

## 2013-04-27 NOTE — Progress Notes (Signed)
Heather Hoover [birth year] [id number]        67 y.o.  G2P2002 for followup exam.  Several issues noted below.  Past medical history,surgical history, medications, allergies, family history and social history were all reviewed and documented in the EPIC chart. ROS:  Was performed and pertinent positives and negatives are included in the history.  Exam: Kim assistant Filed Vitals:   04/27/13 1418  BP: 134/80  Height: 5' 5.5" (1.664 m)  Weight: 190 lb (86.183 kg)   General appearance  Normal Skin grossly normal Head/Neck normal with no cervical or supraclavicular adenopathy thyroid normal Lungs  clear Cardiac RR, without RMG Abdominal  soft, nontender, without masses, organomegaly or hernia Breasts  examined lying and sitting without masses, retractions, discharge or axillary adenopathy. Pelvic  Ext/BUS/vagina  normal with atrophic changes   Adnexa  Without masses or tenderness    Anus and perineum  normal   Rectovaginal  normal sphincter tone without palpated masses or tenderness.    Assessment/Plan:  67 y.o. [id number] female for annual exam.   1. Atrophic vaginitis. Using Vagifem. Doing well with this and I refilled her time to year. We've previously discussed issues and risks and she is comfortable continuing. 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 I refilled her times a year.  3. Osteopenia.DEXA 06/2012 T score -1.2 FRAX 7.9%/0.6%. Increase calcium vitamin D reviewed. Plan repeat DEXA in 1-2 years for trending.  4. Mammography 07/2012. Had followup 6 month stability checked March 2014. Due to repeat in August and knows to a followup for this. SBE monthly reviewed. 5. Pap smear 2012. No Pap smear done today. No history of significant abnormal Pap smears. She is status post hysterectomy. Age 26. Options to stop screening altogether repeat next year 3 year interval reviewed. We'll readdress next year. 6. Colonoscopy 2010. Repeat at their recommended interval. 7. Health  maintenance. No blood work done as it is all done through her primary physician's office. Followup one year, sooner as needed.    Dara Lords MD, 2:55 PM 04/27/2013

## 2013-04-27 NOTE — Patient Instructions (Signed)
Follow up in one year for annual exam 

## 2013-04-28 LAB — URINALYSIS W MICROSCOPIC + REFLEX CULTURE
Bacteria, UA: NONE SEEN
Bilirubin Urine: NEGATIVE
Ketones, ur: NEGATIVE mg/dL
Nitrite: NEGATIVE
Protein, ur: NEGATIVE mg/dL
Specific Gravity, Urine: 1.012 (ref 1.005–1.030)
Squamous Epithelial / LPF: NONE SEEN
Urobilinogen, UA: 0.2 mg/dL (ref 0.0–1.0)

## 2013-05-03 ENCOUNTER — Encounter: Payer: Self-pay | Admitting: Cardiology

## 2013-09-06 ENCOUNTER — Other Ambulatory Visit (INDEPENDENT_AMBULATORY_CARE_PROVIDER_SITE_OTHER): Payer: Medicare PPO

## 2013-09-06 DIAGNOSIS — E7800 Pure hypercholesterolemia, unspecified: Secondary | ICD-10-CM

## 2013-09-06 DIAGNOSIS — E876 Hypokalemia: Secondary | ICD-10-CM

## 2013-09-06 LAB — BASIC METABOLIC PANEL
BUN: 17 mg/dL (ref 6–23)
Calcium: 9.4 mg/dL (ref 8.4–10.5)
GFR: 98.61 mL/min (ref >60.00)
Glucose, Bld: 93 mg/dL (ref 70–99)

## 2013-09-06 LAB — HEPATIC FUNCTION PANEL
ALT: 24 U/L (ref 0–35)
AST: 28 U/L (ref 0–37)
Alkaline Phosphatase: 56 U/L (ref 39–117)
Bilirubin, Direct: 0.1 mg/dL (ref 0.0–0.3)
Total Bilirubin: 0.5 mg/dL (ref 0.3–1.2)

## 2013-09-06 NOTE — Progress Notes (Signed)
Quick Note:  Please make copy of labs for patient visit. ______ 

## 2013-09-13 ENCOUNTER — Encounter: Payer: Self-pay | Admitting: Cardiology

## 2013-09-13 ENCOUNTER — Ambulatory Visit (INDEPENDENT_AMBULATORY_CARE_PROVIDER_SITE_OTHER): Payer: Medicare PPO | Admitting: Cardiology

## 2013-09-13 VITALS — BP 153/81 | HR 72 | Ht 66.0 in | Wt 197.0 lb

## 2013-09-13 DIAGNOSIS — E7800 Pure hypercholesterolemia, unspecified: Secondary | ICD-10-CM

## 2013-09-13 DIAGNOSIS — I119 Hypertensive heart disease without heart failure: Secondary | ICD-10-CM

## 2013-09-13 DIAGNOSIS — Q221 Congenital pulmonary valve stenosis: Secondary | ICD-10-CM

## 2013-09-13 NOTE — Assessment & Plan Note (Signed)
The patient has a history of hypercholesterolemia and dyslipidemia.  Her husband is on a vegan diet and so Heather Hoover has been on a similar diet and has been eating a lot of carbohydrates and starches.  This is causing her weight and her triglycerides to increase.  She will cut back further on carbs and observe response

## 2013-09-13 NOTE — Assessment & Plan Note (Signed)
The patient has not had any chest pain or shortness of breath.  She goes to the [facility] 3 days a week and works out.  Her energy level and stamina are normal

## 2013-09-13 NOTE — Progress Notes (Signed)
Heather Hoover Date of Birth:  01-Feb-1946 [facility] [address] [place], [place]  [address] [phone]         Fax   [phone]  History of Present Illness: This pleasant 67 year old woman is seen for a scheduled followup office visit. She has a history of congenital pulmonic stenosis. She has a history of high blood pressure and high cholesterol. Since last visit she has been feeling well. She has not been having chest pain or shortness of breath. She has occasional migraine headaches. She has had some tingling of her left hand and left arm which occurs most nights. She does not notice it during the day. It may be related to her mattress.  She eventually had an MRI which showed bone spurs on her spine.  She had a trial of Celebrex which did not help much.  The discomfort tends to wax and wane on its own and right now she is not having much problem with it. She is disappointed that she has not been able to lose any weight.   Current Outpatient Prescriptions  Medication Sig Dispense Refill  . aspirin 81 MG tablet Take 81 mg by mouth daily.        . Cholecalciferol (VITAMIN D-3 PO) Take by mouth. Taking 2000 daily       . Estradiol (VAGIFEM) 10 MCG TABS Place 1 tablet (10 mcg total) vaginally 2 (two) times a week.  24 tablet  4  . hydrochlorothiazide (HYDRODIURIL) 25 MG tablet Take 1 tablet (25 mg total) by mouth daily.  90 tablet  3  . metoprolol tartrate (LOPRESSOR) 25 MG tablet Take 1 tablet (25 mg total) by mouth daily.  90 tablet  3  . polycarbophil (FIBERCON) 625 MG tablet Take 625 mg by mouth daily.      . potassium chloride (K-DUR) 10 MEQ tablet Take 1 tablet (10 mEq total) by mouth daily.  90 tablet  3  . rosuvastatin (CRESTOR) 20 MG tablet Take 1 tablet (20 mg total) by mouth daily.  90 tablet  3  . tolterodine (DETROL LA) 4 MG 24 hr capsule Take 1 capsule (4 mg total) by mouth daily.  90 capsule  4  . [DISCONTINUED] tolterodine (DETROL LA) 4 MG 24 hr  capsule Take 1 capsule (4 mg total) by mouth daily.  30 capsule  11   No current facility-administered medications for this visit.    Allergies  Allergen Reactions  . Niacin-Lovastatin Er     Flushing,itching,syncope    Patient Active Problem List   Diagnosis Date Noted  . 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 Exogenous obesity   . Dyslipidemia   . Arthritis   . 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 Hypercholesterolemia 08/17/2011  . Benign hypertensive heart disease without heart failure 08/17/2011    History  Smoking status  . Never Smoker   Smokeless tobacco  . Never Used    History  Alcohol Use  . Yes    Comment: rarely    Family History  Problem Relation Age of Onset  . Hypertension Mother   . Breast cancer Mother 66    Breast   . Cancer Mother     uterine thye think  . Heart disease Mother   . Cancer Father 35    prostate metastized to bone  . Hypertension Sister   . Breast cancer Sister 53  . Cancer Sister 46    uterine    Review of Systems:  Constitutional: no fever chills diaphoresis or fatigue or change in weight.  Head and neck: no hearing loss, no epistaxis, no photophobia or visual disturbance. Respiratory: No cough, shortness of breath or wheezing. Cardiovascular: No chest pain peripheral edema, palpitations. Gastrointestinal: No abdominal distention, no abdominal pain, no change in bowel habits hematochezia or melena. Genitourinary: No dysuria, no frequency, no urgency, no nocturia. Musculoskeletal:No arthralgias, no back pain, no gait disturbance or myalgias. Neurological: No dizziness, no headaches, no numbness, no seizures, no syncope, no weakness, no tremors. Hematologic: No lymphadenopathy, no easy bruising. Psychiatric: No confusion, no hallucinations, no sleep disturbance.    Physical Exam: Filed Vitals:   09/13/13 1002  BP: 153/81  Pulse: 72   the general appearance reveals a well-developed well-nourished  woman in no distress.The head and neck exam reveals pupils equal and reactive.  Extraocular movements are full.  There is no scleral icterus.  The mouth and pharynx are normal.  The neck is supple.  The carotids reveal no bruits.  The jugular venous pressure is normal.  The  thyroid is not enlarged.  There is no lymphadenopathy.  The chest is clear to percussion and auscultation.  There are no rales or rhonchi.  Expansion of the chest is symmetrical.  The precordium is quiet.  The first heart sound is normal.  The second heart sound is physiologically split.  There is no  gallop rub or click.  There is a grade 2/6 systolic ejection murmur across the pulmonic valve  There is no abnormal lift or heave.  The abdomen is soft and nontender.  The bowel sounds are normal.  The liver and spleen are not enlarged.  There are no abdominal masses.  There are no abdominal bruits.  Extremities reveal good pedal pulses.  There is no phlebitis or edema.  There is no cyanosis or clubbing.  Strength is normal and symmetrical in all extremities.  There is no lateralizing weakness.  There are no sensory deficits.  The skin is warm and dry.  There is no rash.   EKG today shows normal sinus rhythm and is within normal limits.  No evidence of right atrial enlargement or strain.  Assessment / Plan:  Continue same medication.  Recheck in 6 months for office visit lipid panel hepatic function panel and basal metabolic panel.  Continue regular exercise and decrease carbs in diet to lose weight.

## 2013-09-13 NOTE — Assessment & Plan Note (Signed)
Her blood pressure is remaining stable on current therapy.  No headaches dizziness or syncope.  No symptoms of CHF.

## 2013-09-13 NOTE — Patient Instructions (Addendum)
Your physician recommends that you continue on your current medications as directed. Please refer to the Current Medication list given to you today.  Your physician wants you to follow-up in: 6 months with fasting labs (lp/bmet/hfp)  You will receive a reminder letter in the mail two months in advance. If you don't receive a letter, please call our office to schedule the follow-up appointment.   DECREASE YOUR CARBOHYDRATE INTAKE

## 2013-09-20 ENCOUNTER — Encounter: Payer: Self-pay | Admitting: Family Medicine

## 2013-10-26 ENCOUNTER — Other Ambulatory Visit: Payer: Self-pay

## 2013-10-31 ENCOUNTER — Other Ambulatory Visit: Payer: Self-pay | Admitting: Cardiology

## 2014-01-29 ENCOUNTER — Other Ambulatory Visit: Payer: Self-pay | Admitting: Cardiology

## 2014-02-21 ENCOUNTER — Other Ambulatory Visit: Payer: Self-pay | Admitting: Cardiology

## 2014-03-06 ENCOUNTER — Other Ambulatory Visit: Payer: Self-pay | Admitting: Cardiology

## 2014-04-12 ENCOUNTER — Other Ambulatory Visit (INDEPENDENT_AMBULATORY_CARE_PROVIDER_SITE_OTHER): Payer: Medicare PPO

## 2014-04-12 DIAGNOSIS — I119 Hypertensive heart disease without heart failure: Secondary | ICD-10-CM

## 2014-04-12 DIAGNOSIS — E7800 Pure hypercholesterolemia, unspecified: Secondary | ICD-10-CM

## 2014-04-12 LAB — BASIC METABOLIC PANEL
BUN: 13 mg/dL (ref 6–23)
CO2: 29 mEq/L (ref 19–32)
Calcium: 9.5 mg/dL (ref 8.4–10.5)
Chloride: 99 mEq/L (ref 96–112)
Creatinine, Ser: 0.6 mg/dL (ref 0.4–1.2)
GFR: 98.43 mL/min (ref >60.00)
Glucose, Bld: 89 mg/dL (ref 70–99)
POTASSIUM: 3.5 meq/L (ref 3.5–5.1)
SODIUM: 137 meq/L (ref 135–145)

## 2014-04-12 LAB — HEPATIC FUNCTION PANEL
ALBUMIN: 4.1 g/dL (ref 3.5–5.2)
ALT: 20 U/L (ref 0–35)
AST: 21 U/L (ref 0–37)
Alkaline Phosphatase: 55 U/L (ref 39–117)
Bilirubin, Direct: 0 mg/dL (ref 0.0–0.3)
Total Bilirubin: 0.5 mg/dL (ref 0.3–1.2)
Total Protein: 7.2 g/dL (ref 6.0–8.3)

## 2014-04-12 LAB — LIPID PANEL
CHOLESTEROL: 166 mg/dL (ref 0–200)
HDL: 50.5 mg/dL (ref >39.00)
LDL Cholesterol: 83 mg/dL (ref 0–99)
TRIGLYCERIDES: 163 mg/dL — AB (ref 0.0–149.0)
Total CHOL/HDL Ratio: 3
VLDL: 32.6 mg/dL (ref 0.0–40.0)

## 2014-04-12 NOTE — Progress Notes (Signed)
Quick Note:  Please make copy of labs for patient visit. ______ 

## 2014-04-16 ENCOUNTER — Encounter: Payer: Self-pay | Admitting: Cardiology

## 2014-04-16 ENCOUNTER — Ambulatory Visit (INDEPENDENT_AMBULATORY_CARE_PROVIDER_SITE_OTHER): Payer: Medicare PPO | Admitting: Cardiology

## 2014-04-16 VITALS — BP 126/84 | HR 79 | Ht 66.0 in | Wt 196.0 lb

## 2014-04-16 DIAGNOSIS — E785 Hyperlipidemia, unspecified: Secondary | ICD-10-CM

## 2014-04-16 DIAGNOSIS — Q221 Congenital pulmonary valve stenosis: Secondary | ICD-10-CM

## 2014-04-16 DIAGNOSIS — E7800 Pure hypercholesterolemia, unspecified: Secondary | ICD-10-CM

## 2014-04-16 DIAGNOSIS — E876 Hypokalemia: Secondary | ICD-10-CM

## 2014-04-16 DIAGNOSIS — I119 Hypertensive heart disease without heart failure: Secondary | ICD-10-CM

## 2014-04-16 NOTE — Progress Notes (Signed)
Heather ShipPriscilla Hoover Date of Birth:  [birth year]  [address] [place], [place]  [id number] [phone]         Fax   [phone]  History of Present Illness: This pleasant 68 year old woman is seen for a scheduled followup office visit. She has a history of congenital pulmonic stenosis. She has a history of high blood pressure and high cholesterol. Since last visit she has been feeling well. She has not been having chest pain or shortness of breath. She has occasional migraine headaches. She has had some tingling of her left hand and left arm which occurs most nights. She does not notice it during the day. It may be related to her mattress.  She eventually had an MRI which showed bone spurs on her spine.  She had a trial of Celebrex which did not help much.  The discomfort tends to wax and wane on its own and right now she is not having much problem with it. Since last visit the patient has had some intermittent chest pain secondary to dysphagia.  Her symptoms are relieved by Mylanta. She is disappointed that she has not been able to lose any weight.   Current Outpatient Prescriptions  Medication Sig Dispense Refill  . aspirin 81 MG tablet Take 81 mg by mouth daily.        . Cholecalciferol (VITAMIN D-3 PO) Take by mouth. Taking 2000 daily       . Estradiol (VAGIFEM) 10 MCG TABS Place 1 tablet (10 mcg total) vaginally 2 (two) times a week.  24 tablet  4  . hydrochlorothiazide (HYDRODIURIL) 25 MG tablet TAKE 1 TABLET BY MOUTH DAILY  90 tablet  0  . KLOR-CON 10 10 MEQ tablet TAKE 1 TABLET BY MOUTH DAILY  90 tablet  0  . metoprolol tartrate (LOPRESSOR) 25 MG tablet TAKE 1 TABLET BY MOUTH EVERY DAY  90 tablet  0  . polycarbophil (FIBERCON) 625 MG tablet Take 625 mg by mouth daily.      . rosuvastatin (CRESTOR) 20 MG tablet Take 1 tablet (20 mg total) by mouth daily.  90 tablet  3  . tolterodine (DETROL LA) 4 MG 24 hr capsule Take 1 capsule (4 mg total) by mouth daily.  90 capsule   4  . [DISCONTINUED] tolterodine (DETROL LA) 4 MG 24 hr capsule Take 1 capsule (4 mg total) by mouth daily.  30 capsule  11   No current facility-administered medications for this visit.    Allergies  Allergen Reactions  . Niacin-Lovastatin Er     Flushing,itching,syncope    Patient Active Problem List   Diagnosis Date Noted  . 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 Exogenous obesity   . Dyslipidemia   . Arthritis   . 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 Hypercholesterolemia 08/17/2011  . Benign hypertensive heart disease without heart failure 08/17/2011    History  Smoking status  . Never Smoker   Smokeless tobacco  . Never Used    History  Alcohol Use  . Yes    Comment: rarely    Family History  Problem Relation Age of Onset  . Hypertension Mother   . Breast cancer Mother 6080    Breast   . Cancer Mother     uterine thye think  . Heart disease Mother   . Cancer Father 6978    prostate metastized to bone  . Hypertension Sister   . Breast cancer Sister 2465  . Cancer  Sister 6155    uterine    Review of Systems: Constitutional: no fever chills diaphoresis or fatigue or change in weight.  Head and neck: no hearing loss, no epistaxis, no photophobia or visual disturbance. Respiratory: No cough, shortness of breath or wheezing. Cardiovascular: No chest pain peripheral edema, palpitations. Gastrointestinal: No abdominal distention, no abdominal pain, no change in bowel habits hematochezia or melena. Genitourinary: No dysuria, no frequency, no urgency, no nocturia. Musculoskeletal:No arthralgias, no back pain, no gait disturbance or myalgias. Neurological: No dizziness, no headaches, no numbness, no seizures, no syncope, no weakness, no tremors. Hematologic: No lymphadenopathy, no easy bruising. Psychiatric: No confusion, no hallucinations, no sleep disturbance.    Physical Exam: Filed Vitals:   04/16/14 1415  BP: 126/84  Pulse: 79   the  general appearance reveals a well-developed well-nourished woman in no distress.The head and neck exam reveals pupils equal and reactive.  Extraocular movements are full.  There is no scleral icterus.  The mouth and pharynx are normal.  The neck is supple.  The carotids reveal no bruits.  The jugular venous pressure is normal.  The  thyroid is not enlarged.  There is no lymphadenopathy.  The chest is clear to percussion and auscultation.  There are no rales or rhonchi.  Expansion of the chest is symmetrical.  The precordium is quiet.  The first heart sound is normal.  The second heart sound is physiologically split.  There is no  gallop rub or click.  There is a grade 2/6 systolic ejection murmur across the pulmonic valve  There is no abnormal lift or heave.  The abdomen is soft and nontender.  The bowel sounds are normal.  The liver and spleen are not enlarged.  There are no abdominal masses.  There are no abdominal bruits.  Extremities reveal good pedal pulses.  There is no phlebitis or edema.  There is no cyanosis or clubbing.  Strength is normal and symmetrical in all extremities.  There is no lateralizing weakness.  There are no sensory deficits.  The skin is warm and dry.  There is no rash.   EKG today shows normal sinus rhythm and is within normal limits.  No evidence of right atrial enlargement or strain.  Assessment / Plan:  Continue same medication.  Recheck in 6 months for office visit lipid panel hepatic function panel and basal metabolic panel.  Continue regular exercise and decrease carbs in diet to lose weight.

## 2014-04-16 NOTE — Patient Instructions (Signed)
Your physician recommends that you continue on your current medications as directed. Please refer to the Current Medication list given to you today.  Your physician wants you to follow-up in: 6 months with fasting labs (lp/bmet/hfp)  You will receive a reminder letter in the mail two months in advance. If you don't receive a letter, please call our office to schedule the follow-up appointment.  

## 2014-04-16 NOTE — Assessment & Plan Note (Signed)
The patient has a history of dyslipidemia.  He is on Crestor 20 mg daily.  She's not having any myalgias.  We reviewed her lab work which is satisfactory.

## 2014-04-16 NOTE — Assessment & Plan Note (Signed)
Blood pressure has been stable.  No headaches dizziness or syncope.  The [facility] to [facility].  Her weight is down 1 pound.

## 2014-04-16 NOTE — Assessment & Plan Note (Signed)
The patient is not having any signs or symptoms from her mild pulmonic stenosis.

## 2014-04-26 ENCOUNTER — Other Ambulatory Visit: Payer: Self-pay | Admitting: Cardiology

## 2014-04-26 MED ORDER — METOPROLOL TARTRATE 25 MG PO TABS: ORAL_TABLET | ORAL | Status: DC

## 2014-05-03 ENCOUNTER — Encounter: Payer: Self-pay | Admitting: Cardiology

## 2014-05-03 ENCOUNTER — Other Ambulatory Visit: Payer: Self-pay | Admitting: Gynecology

## 2014-05-24 ENCOUNTER — Other Ambulatory Visit: Payer: Self-pay | Admitting: Cardiology

## 2014-06-03 ENCOUNTER — Other Ambulatory Visit: Payer: Self-pay | Admitting: Cardiology

## 2014-06-12 ENCOUNTER — Encounter: Payer: Self-pay | Admitting: Gynecology

## 2014-06-12 ENCOUNTER — Ambulatory Visit (INDEPENDENT_AMBULATORY_CARE_PROVIDER_SITE_OTHER): Payer: Medicare PPO | Admitting: Gynecology

## 2014-06-12 VITALS — BP 124/80 | Ht 66.0 in | Wt 202.0 lb

## 2014-06-12 DIAGNOSIS — M899 Disorder of bone, unspecified: Secondary | ICD-10-CM

## 2014-06-12 DIAGNOSIS — N816 Rectocele: Secondary | ICD-10-CM

## 2014-06-12 DIAGNOSIS — M8580 Other specified disorders of bone density and structure, unspecified site: Secondary | ICD-10-CM

## 2014-06-12 DIAGNOSIS — N318 Other neuromuscular dysfunction of bladder: Secondary | ICD-10-CM

## 2014-06-12 DIAGNOSIS — N3281 Overactive bladder: Secondary | ICD-10-CM

## 2014-06-12 DIAGNOSIS — M949 Disorder of cartilage, unspecified: Secondary | ICD-10-CM

## 2014-06-12 DIAGNOSIS — N8111 Cystocele, midline: Secondary | ICD-10-CM

## 2014-06-12 DIAGNOSIS — N952 Postmenopausal atrophic vaginitis: Secondary | ICD-10-CM

## 2014-06-12 MED ORDER — TOLTERODINE TARTRATE ER 4 MG PO CP24: ORAL_CAPSULE | ORAL | Status: DC

## 2014-06-12 NOTE — Patient Instructions (Signed)
Try stopping the Vagifem when you run out. If you develop any symptoms and want to restart the Vagifem call the office. Continue on the Detrol bladder medication as we discussed. Followup for bone density as scheduled. Followup in one year for annual exam.

## 2014-06-12 NOTE — Progress Notes (Signed)
Heather Hoover 03-03-1946 [id number]        68 y.o.  G2P2002 for followup exam. Several issues that are below  Past medical history,surgical history, problem list, medications, allergies, family history and social history were all reviewed and documented as reviewed in the EPIC chart.  ROS:  12 system ROS performed with pertinent positives and negatives included in the history, assessment and plan.  Included Systems: General, HEENT, Neck, Cardiovascular, Pulmonary, Gastrointestinal, Genitourinary, Musculoskeletal, Dermatologic, Endocrine, Hematological, Neurologic, Psychiatric Additional significant findings :  none   Exam: Kim assistant Filed Vitals:   06/12/14 1407  BP: 124/80  Height: 5\' 6"  (1.676 m)  Weight: 202 lb (91.627 kg)   General appearance:  Normal affect, orientation and appearance. Skin: Grossly normal HEENT: Without gross lesions.  No cervical or supraclavicular adenopathy. Thyroid normal.  Lungs:  Clear without wheezing, rales or rhonchi Cardiac: RR, with 2/6 systolic ejection murmur left sternal border Abdominal:  Soft, nontender, without masses, guarding, rebound, organomegaly or hernia Breasts:  Examined lying and sitting without masses, retractions, discharge or axillary adenopathy. Pelvic:  Ext/BUS/vagina with first degree cystocele and first degree rectocele. Cuff well supported.generalized atrophic changes.  Adnexa  Without masses or tenderness    Anus and perineum  Normal   Rectovaginal  Normal sphincter tone without palpated masses or tenderness.    Assessment/Plan:  68 y.o. [id number] female for followup exam.   1. Postmenopausal/atrophic genital changes. Is using Vagifem 10 mcg twice weekly. Has been on this for years and can't remember exactly why she started although she remembers Dr. Lelon PerlaMacPhail saying that she looked dry. Options to stop now monitor symptoms reviewed. Patient is interested in trying this. She'll go ahead and stop the Vagifem when she runs  out of her at-home medication. As long as she does well without significant vaginal dryness or dyspareunia then will monitor. If she does develop symptoms and wants to restart that she'll call and will prescribe this for her. We have talked about the issues and risks to include absorption and systemic estrogen effect such as increased risk of blood clots stroke MI and possible breast cancer issues. 2. Detrusor instability. Continues on Detrol XL doing well. Wants refill which was provided x1 year. Check urinalysis. 3. Mild cystocele/rectocele. Never really noted before.patient without symptoms such as bulging, pressure or stool trapping. We'll plan expectant management. Report any symptoms otherwise annual followup. 4. Osteopenia. DEXA 06/2012 T score -1.2. FRAX 7.9%/0.6%. Increase calcium vitamin D reviewed. Repeat DEXA now at two-year interval and patient will schedule. 5. Pap smear 2012. No Pap smear done today. No history of abnormal Pap smears previously. Status post hysterectomy for benign indications.  Over the age of 65Review current screening guidelines options to stop screening reviewed.patient's comfortable with this. 6. Mammography 08/2013. Continue with annual mammography. SBE monthly reviewed. 7. Colonoscopy 2010. Patient reports recommended repeat interval 10 years. 8. Health maintenance. No routine blood work done as this is done through her primary physician's office. Follow up one year, sooner as needed.   Note: This document was prepared with digital dictation and possible smart phrase technology. Any transcriptional errors that result from this process are unintentional.   Dara LordsFONTAINE,TIMOTHY P MD, 2:38 PM 06/12/2014

## 2014-06-13 LAB — URINALYSIS W MICROSCOPIC + REFLEX CULTURE
Bacteria, UA: NONE SEEN
Bilirubin Urine: NEGATIVE
CASTS: NONE SEEN
Crystals: NONE SEEN
GLUCOSE, UA: NEGATIVE mg/dL
Hgb urine dipstick: NEGATIVE
Ketones, ur: NEGATIVE mg/dL
LEUKOCYTES UA: NEGATIVE
Nitrite: NEGATIVE
PH: 6 (ref 5.0–8.0)
Protein, ur: NEGATIVE mg/dL
SQUAMOUS EPITHELIAL / LPF: NONE SEEN
Specific Gravity, Urine: 1.006 (ref 1.005–1.030)
Urobilinogen, UA: 0.2 mg/dL (ref 0.0–1.0)

## 2014-06-20 DIAGNOSIS — M8580 Other specified disorders of bone density and structure, unspecified site: Secondary | ICD-10-CM

## 2014-06-20 HISTORY — DX: Other specified disorders of bone density and structure, unspecified site: M85.80

## 2014-06-28 ENCOUNTER — Encounter: Payer: Self-pay | Admitting: Gynecology

## 2014-06-28 ENCOUNTER — Ambulatory Visit (INDEPENDENT_AMBULATORY_CARE_PROVIDER_SITE_OTHER): Payer: Medicare PPO

## 2014-06-28 DIAGNOSIS — M949 Disorder of cartilage, unspecified: Secondary | ICD-10-CM

## 2014-06-28 DIAGNOSIS — M899 Disorder of bone, unspecified: Secondary | ICD-10-CM

## 2014-06-28 DIAGNOSIS — M8580 Other specified disorders of bone density and structure, unspecified site: Secondary | ICD-10-CM

## 2014-07-12 ENCOUNTER — Ambulatory Visit (INDEPENDENT_AMBULATORY_CARE_PROVIDER_SITE_OTHER): Payer: Medicare PPO | Admitting: Emergency Medicine

## 2014-07-12 VITALS — BP 140/80 | HR 80 | Temp 98.5°F | Resp 16 | Ht 65.5 in | Wt 199.2 lb

## 2014-07-12 DIAGNOSIS — J018 Other acute sinusitis: Secondary | ICD-10-CM

## 2014-07-12 MED ORDER — AMOXICILLIN-POT CLAVULANATE 875-125 MG PO TABS: 1.0000 | ORAL_TABLET | Freq: Two times a day (BID) | ORAL | Status: DC

## 2014-07-12 NOTE — Progress Notes (Signed)
[facility] [address] [place] [address] [phone]  Date:  07/12/2014   Name:  Heather Hoover   DOB:  1946-03-11   MRN:  [id number]  PCP:  Leo Grosser, MD    Chief Complaint: Sore Throat, Headache, Cough and Sinusitis   History of Present Illness:  Heather Hoover is a 68 y.o. very pleasant female patient who presents with the following:  Ill for a week with sore throat.has affected her and her husband sinc July 4th visit from children.  Had chills last night. Has pressure in sinuses.  Post nasal drainage.  Fatigued.  Has a non productive cough.  No wheezing or shortness of breath. No nausea or vomiting.  No stool change or rash.  No improvement with over the counter medications or other home remedies. Denies other complaint or health concern today.   Patient Active Problem List   Diagnosis Date Noted  . 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 Exogenous obesity   . Dyslipidemia   . Arthritis   . 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 Hypercholesterolemia 08/17/2011  . Benign hypertensive heart disease without heart failure 08/17/2011    Past Medical History  Diagnosis Date  . 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 Hypertension   . Exogenous obesity   . Dyslipidemia   . Heart murmur   . Arthritis   . Osteopenia 06/2014    T score -1.1 FRAX 8.1%/0.7%  . Bone spur     Past Surgical History  Procedure Laterality Date  . Cardiac catheterization      when in 3rd grade and age 63  . Knee arthroscopy      right  . Childbirth      x 2 vaginal deliveries  . Appendectomy    . Forehead plastic surgery      MO's surgery  . Oophorectomy      RSO  . Abdominal hysterectomy  1982    RSO  . Breast surgery    . Fracture surgery    . Cosmetic surgery      History  Substance Use Topics  . Smoking status: Never Smoker   . Smokeless tobacco: Never Used  . Alcohol Use: Yes     Comment:  rarely    Family History  Problem Relation Age of Onset  . Hypertension Mother   . Breast cancer Mother 60    Breast   . Cancer Mother     uterine thye think  . Heart disease Mother   . Cancer Father 31    prostate metastized to bone  . Hypertension Sister   . Breast cancer Sister 4  . Cancer Sister 75    uterine  . Diabetes Maternal Grandfather   . Diabetes Paternal Grandmother     Allergies  Allergen Reactions  . Niacin-Lovastatin Er     Flushing,itching,syncope    Medication list has been reviewed and updated.  Current Outpatient Prescriptions on File Prior to Visit  Medication Sig Dispense Refill  . aspirin 81 MG tablet Take 81 mg by mouth daily.        . Cholecalciferol (VITAMIN D-3 PO) Take by mouth. Taking 2000 daily       . CRESTOR 20 MG tablet TAKE 1 TABLET BY MOUTH DAILY  90 tablet  0  . Estradiol (VAGIFEM) 10 MCG TABS Place 1 tablet (10 mcg total) vaginally 2 (two) times a week.  24 tablet  4  . hydrochlorothiazide (HYDRODIURIL) 25 MG tablet TAKE 1 TABLET BY MOUTH EVERY DAY.  90 tablet  0  . metoprolol tartrate (LOPRESSOR) 25 MG tablet TAKE 1 TABLET BY MOUTH EVERY DAY  90 tablet  3  . polycarbophil (FIBERCON) 625 MG tablet Take 625 mg by mouth daily.      . potassium chloride (K-DUR) 10 MEQ tablet TAKE 1 TABLET BY MOUTH EVERY DAY.  90 tablet  0  . tolterodine (DETROL LA) 4 MG 24 hr capsule TAKE 1 CAPSULE BY MOUTH DAILY  30 capsule  11  . [DISCONTINUED] tolterodine (DETROL LA) 4 MG 24 hr capsule Take 1 capsule (4 mg total) by mouth daily.  30 capsule  11   No current facility-administered medications on file prior to visit.    Review of Systems:  As per HPI, otherwise negative.    Physical Examination: Filed Vitals:   07/12/14 1220  BP: 140/80  Pulse: 80  Temp: 98.5 F (36.9 C)  Resp: 16   Filed Vitals:   07/12/14 1220  Height: 5' 5.5" (1.664 m)  Weight: 199 lb 3.2 oz (90.357 kg)   Body mass index is 32.63 kg/(m^2). Ideal Body Weight: Weight  in (lb) to have BMI = 25: 152.2  GEN: WDWN, NAD, Non-toxic, A & O x 3 HEENT: Atraumatic, Normocephalic. Neck supple. No masses, No LAD. Ears and Nose: No external deformity. CV: RRR, No M/G/R. No JVD. No thrill. No extra heart sounds. PULM: CTA B, no wheezes, crackles, rhonchi. No retractions. No resp. distress. No accessory muscle use. ABD: S, NT, ND, +BS. No rebound. No HSM. EXTR: No c/c/e NEURO Normal gait.  PSYCH: Normally interactive. Conversant. Not depressed or anxious appearing.  Calm demeanor.      Assessment and Plan: Sinusitis Pharyngitis augmentin mucinex   Signed,  Phillips OdorJeffery Anderson, MD

## 2014-07-12 NOTE — Patient Instructions (Signed)

## 2014-07-25 ENCOUNTER — Encounter: Payer: Self-pay | Admitting: Family Medicine

## 2014-07-30 ENCOUNTER — Encounter: Payer: Self-pay | Admitting: Gynecology

## 2014-07-30 ENCOUNTER — Ambulatory Visit (INDEPENDENT_AMBULATORY_CARE_PROVIDER_SITE_OTHER): Payer: Medicare PPO | Admitting: Gynecology

## 2014-07-30 VITALS — BP 136/84 | Temp 100.0°F

## 2014-07-30 DIAGNOSIS — R350 Frequency of micturition: Secondary | ICD-10-CM

## 2014-07-30 DIAGNOSIS — N3001 Acute cystitis with hematuria: Secondary | ICD-10-CM

## 2014-07-30 DIAGNOSIS — N3000 Acute cystitis without hematuria: Secondary | ICD-10-CM

## 2014-07-30 DIAGNOSIS — IMO0001 Reserved for inherently not codable concepts without codable children: Secondary | ICD-10-CM

## 2014-07-30 LAB — URINALYSIS W MICROSCOPIC + REFLEX CULTURE
Bilirubin Urine: NEGATIVE
CRYSTALS: NONE SEEN
Casts: NONE SEEN
GLUCOSE, UA: 100 mg/dL — AB
Ketones, ur: NEGATIVE mg/dL
NITRITE: POSITIVE — AB
Protein, ur: 30 mg/dL — AB
Specific Gravity, Urine: <1.005 — ABNORMAL LOW (ref 1.005–1.030)
UROBILINOGEN UA: 2 mg/dL — AB (ref 0.0–1.0)
pH: 5.5 (ref 5.0–8.0)

## 2014-07-30 MED ORDER — NITROFURANTOIN MONOHYD MACRO 100 MG PO CAPS: 100.0000 mg | ORAL_CAPSULE | Freq: Two times a day (BID) | ORAL | Status: DC

## 2014-07-30 NOTE — Patient Instructions (Signed)
Nitrofurantoin tablets or capsules What is this medicine? NITROFURANTOIN (nye troe fyoor AN toyn) is an antibiotic. It is used to treat urinary tract infections. This medicine may be used for other purposes; ask your health care provider or pharmacist if you have questions. COMMON BRAND NAME(S): Macrobid, Macrodantin, Urotoin What should I tell my health care provider before I take this medicine? They need to know if you have any of these conditions: -anemia -diabetes -glucose-6-phosphate dehydrogenase deficiency -kidney disease -liver disease -lung disease -other chronic illness -an unusual or allergic reaction to nitrofurantoin, other antibiotics, other medicines, foods, dyes or preservatives -pregnant or trying to get pregnant -breast-feeding How should I use this medicine? Take this medicine by mouth with a glass of water. Follow the directions on the prescription label. Take this medicine with food or milk. Take your doses at regular intervals. Do not take your medicine more often than directed. Do not stop taking except on your doctor's advice. Talk to your pediatrician regarding the use of this medicine in children. While this drug may be prescribed for selected conditions, precautions do apply. Overdosage: If you think you have taken too much of this medicine contact a poison control center or emergency room at once. NOTE: This medicine is only for you. Do not share this medicine with others. What if I miss a dose? If you miss a dose, take it as soon as you can. If it is almost time for your next dose, take only that dose. Do not take double or extra doses. What may interact with this medicine? -antacids containing magnesium trisilicate -probenecid -quinolone antibiotics like ciprofloxacin, lomefloxacin, norfloxacin and ofloxacin -sulfinpyrazone This list may not describe all possible interactions. Give your health care provider a list of all the medicines, herbs,  non-prescription drugs, or dietary supplements you use. Also tell them if you smoke, drink alcohol, or use illegal drugs. Some items may interact with your medicine. What should I watch for while using this medicine? Tell your doctor or health care professional if your symptoms do not improve or if you get new symptoms. Drink several glasses of water a day. If you are taking this medicine for a long time, visit your doctor for regular checks on your progress. If you are diabetic, you may get a false positive result for sugar in your urine with certain brands of urine tests. Check with your doctor. What side effects may I notice from receiving this medicine? Side effects that you should report to your doctor or health care professional as soon as possible: -allergic reactions like skin rash or hives, swelling of the face, lips, or tongue -chest pain -cough -difficulty breathing -dizziness, drowsiness -fever or infection -joint aches or pains -pale or blue-tinted skin -redness, blistering, peeling or loosening of the skin, including inside the mouth -tingling, burning, pain, or numbness in hands or feet -unusual bleeding or bruising -unusually weak or tired -yellowing of eyes or skin Side effects that usually do not require medical attention (report to your doctor or health care professional if they continue or are bothersome): -dark urine -diarrhea -headache -loss of appetite -nausea or vomiting -temporary hair loss This list may not describe all possible side effects. Call your doctor for medical advice about side effects. You may report side effects to FDA at 1-800-FDA-1088. Where should I keep my medicine? Keep out of the reach of children. Store at room temperature between 15 and 30 degrees C (59 and 86 degrees F). Protect from light. Throw away any unused   medicine after the expiration date. NOTE: This sheet is a summary. It may not cover all possible information. If you have  questions about this medicine, talk to your doctor, pharmacist, or health care provider.  2015, Elsevier/Gold Standard. (2008-06-27 15:56:47) Urinary Tract Infection Urinary tract infections (UTIs) can develop anywhere along your urinary tract. Your urinary tract is your body's drainage system for removing wastes and extra water. Your urinary tract includes two kidneys, two ureters, a bladder, and a urethra. Your kidneys are a pair of bean-shaped organs. Each kidney is about the size of your fist. They are located below your ribs, one on each side of your spine. CAUSES Infections are caused by microbes, which are microscopic organisms, including fungi, viruses, and bacteria. These organisms are so small that they can only be seen through a microscope. Bacteria are the microbes that most commonly cause UTIs. SYMPTOMS  Symptoms of UTIs may vary by age and gender of the patient and by the location of the infection. Symptoms in young women typically include a frequent and intense urge to urinate and a painful, burning feeling in the bladder or urethra during urination. Older women and men are more likely to be tired, shaky, and weak and have muscle aches and abdominal pain. A fever may mean the infection is in your kidneys. Other symptoms of a kidney infection include pain in your back or sides below the ribs, nausea, and vomiting. DIAGNOSIS To diagnose a UTI, your caregiver will ask you about your symptoms. Your caregiver also will ask to provide a urine sample. The urine sample will be tested for bacteria and white blood cells. White blood cells are made by your body to help fight infection. TREATMENT  Typically, UTIs can be treated with medication. Because most UTIs are caused by a bacterial infection, they usually can be treated with the use of antibiotics. The choice of antibiotic and length of treatment depend on your symptoms and the type of bacteria causing your infection. HOME CARE  INSTRUCTIONS  If you were prescribed antibiotics, take them exactly as your caregiver instructs you. Finish the medication even if you feel better after you have only taken some of the medication.  Drink enough water and fluids to keep your urine clear or pale yellow.  Avoid caffeine, tea, and carbonated beverages. They tend to irritate your bladder.  Empty your bladder often. Avoid holding urine for long periods of time.  Empty your bladder before and after sexual intercourse.  After a bowel movement, women should cleanse from front to back. Use each tissue only once. SEEK MEDICAL CARE IF:   You have back pain.  You develop a fever.  Your symptoms do not begin to resolve within 3 days. SEEK IMMEDIATE MEDICAL CARE IF:   You have severe back pain or lower abdominal pain.  You develop chills.  You have nausea or vomiting.  You have continued burning or discomfort with urination. MAKE SURE YOU:   Understand these instructions.  Will watch your condition.  Will get help right away if you are not doing well or get worse. Document Released: 09/16/2005 Document Revised: 06/07/2012 Document Reviewed: 01/15/2012 ExitCare Patient Information 2015 [employer]. This information is not intended to replace advice given to you by your health care provider. Make sure you discuss any questions you have with your health care provider.  

## 2014-07-30 NOTE — Addendum Note (Signed)
Addended by: Berna SpareASTILLO, Judine Arciniega A on: 07/30/2014 12:51 PM   Modules accepted: Orders

## 2014-07-30 NOTE — Progress Notes (Signed)
   68 year old patient presented to the office today complaining of the past few days having urinary frequency but no true dysuria some mild back discomfort. Patient denied any vaginal discharge or any fever, nausea, or vomiting. She got over-the-counter Azo which has helped her bladder spasm.  Exam: Back: No CVA tenderness Abdomen: Soft nontender no rebound guarding Pelvic exam: Not done  Urinalysis White blood cell: 21-50 Rbc: 7-10 Bacteria: Many  Assessment/plan: Clinical evidence of urinary tract infection. Patient will be prescribed Macrobid 1 by mouth twice a day for 7 days. She may continue the over-the-counter anti-spasmodic agents Azo. She was encouraged to increase her fluid intake. If she notices no improvement within the next 48-72 hours or spell which is back pain, fever, chills, nausea or vomiting she is to report to the office immediately or after hours the emergency room.

## 2014-08-01 LAB — URINE CULTURE: Colony Count: >=100000

## 2014-08-06 ENCOUNTER — Encounter: Payer: Self-pay | Admitting: Family Medicine

## 2014-08-06 NOTE — Telephone Encounter (Signed)
[phone]  Pt is coming in for an apt her on 24th and she is needing a order for the mammogram that she has scheduled in Sept at [facility] on [address] [facility] [address]

## 2014-08-08 ENCOUNTER — Ambulatory Visit (INDEPENDENT_AMBULATORY_CARE_PROVIDER_SITE_OTHER): Payer: Medicare PPO | Admitting: Gynecology

## 2014-08-08 ENCOUNTER — Encounter: Payer: Self-pay | Admitting: Gynecology

## 2014-08-08 VITALS — BP 110/70 | Temp 98.3°F

## 2014-08-08 DIAGNOSIS — R300 Dysuria: Secondary | ICD-10-CM

## 2014-08-08 DIAGNOSIS — A499 Bacterial infection, unspecified: Secondary | ICD-10-CM

## 2014-08-08 DIAGNOSIS — N898 Other specified noninflammatory disorders of vagina: Secondary | ICD-10-CM

## 2014-08-08 DIAGNOSIS — L293 Anogenital pruritus, unspecified: Secondary | ICD-10-CM

## 2014-08-08 DIAGNOSIS — N760 Acute vaginitis: Secondary | ICD-10-CM

## 2014-08-08 DIAGNOSIS — B9689 Other specified bacterial agents as the cause of diseases classified elsewhere: Secondary | ICD-10-CM

## 2014-08-08 MED ORDER — CLINDAMYCIN PHOSPHATE 2 % VA CREA: 1.0000 | TOPICAL_CREAM | Freq: Every day | VAGINAL | Status: DC

## 2014-08-08 NOTE — Patient Instructions (Addendum)
Clindamycin vaginal cream What is this medicine? CLINDAMYCIN (KLIN da MYE sin) is a lincosamide antibiotic. It is used to treat vaginal infections caused by certain bacteria. This medicine may be used for other purposes; ask your health care provider or pharmacist if you have questions. COMMON BRAND NAME(S): Cleocin, ClindaMax, Clindesse What should I tell my health care provider before I take this medicine? They need to know if you have any of these conditions: -diarrhea -inflammatory bowel disease -kidney or liver disease -stomach problems like colitis -an unusual or allergic reaction to clindamycin, lincomycin, other medicines, foods, dyes or preservatives -pregnant or trying to get pregnant -breast-feeding How should I use this medicine? This medicine is only for use in the vagina. Place in the vagina using the special applicator supplied with the cream. Wash hands before and after use. Fill the applicator with cream. Lie on your back, part and bend your knees. Insert the applicator into the vagina and push the plunger to expel the cream into the vagina. Wash the applicator with warm soapy water and rinse well. Keep this medicine out of the eyes. If you do get any in your eyes rinse out with plenty of cool tap water. Take your medicine at regular intervals. Do not take your medicine more often than directed. Use this medicine for the full course prescribed by your doctor or health care professional, even if you think your condition is better. Do not stop using except on your the advice of your doctor or health care professional. Talk to your pediatrician regarding the use of this medicine in children. Special care may be needed. Overdosage: If you think you have taken too much of this medicine contact a poison control center or emergency room at once. NOTE: This medicine is only for you. Do not share this medicine with others. What if I miss a dose? If you miss a dose, use it as soon as you  can. If it is almost time for your next dose, use only that dose. Do not use double or extra doses. What may interact with this medicine? Interactions are not expected. Do not use any other vaginal products without telling your doctor or health care professional. This list may not describe all possible interactions. Give your health care provider a list of all the medicines, herbs, non-prescription drugs, or dietary supplements you use. Also tell them if you smoke, drink alcohol, or use illegal drugs. Some items may interact with your medicine. What should I watch for while using this medicine? Tell your doctor or health care professional if your symptoms do not start to get better in a few days. Do not use tampons or douches while using this medicine. Do not have sex until you have finished your treatment. Having sex can make the treatment less effective. After you finish treatment, do not use latex condoms for three days. There may still be some medicine in the vagina. This can damage the latex and make the condom less effective at preventing pregnancy. Your clothing may get soiled. To help prevent reinfection, wear freshly washed cotton, not synthetic, underwear. What side effects may I notice from receiving this medicine? Side effects that you should report to your doctor or health care professional as soon as possible: -allergic reactions like skin rash, itching or hives, swelling of the face, lips, or tongue -diarrhea that is watery or severe -fever or chills, sore throat -increased thirst -itching of the vaginal or genital area -pain during sexual intercourse -stomach pain or   cramps -thick white vaginal discharge -unusual bleeding or bruising Side effects that usually do not require medical attention (report to your doctor or health care professional if they continue or are bothersome): -nausea, vomiting This list may not describe all possible side effects. Call your doctor for medical  advice about side effects. You may report side effects to FDA at 1-800-FDA-1088. Where should I keep my medicine? Keep out of the reach of children. Store at room temperature between 20 and 25 degrees C (68 and 77 degrees F). Do not freeze. Throw away any unused medicine after the expiration date. NOTE: This sheet is a summary. It may not cover all possible information. If you have questions about this medicine, talk to your doctor, pharmacist, or health care provider.  2015, Elsevier/Gold Standard. (2013-07-13 16:18:30)  

## 2014-08-08 NOTE — Progress Notes (Signed)
   68 year old patient who presented to the office complaining of some or pruritus the floor of the abdomen discomfort was some increased fatigue and decreased energy. She was recently treated for urinary tract infection August 10 of this year the microorganisms described was Escherichia coli. The sensitivity panel demonstrated that the microorganisms were sensitive to the Macrobid. Her urinalysis today was negative.   Exam: Bartholin urethra Skene was within normal limits Vagina cuff intact Bimanual exam: No palpable mass or tenderness Rectovaginal exam not done  Wet prep and many bacteria few WBC  Assessment/plan: Clinical features suggestive of early bacterial vaginosis. Patient be placed on Cleocin vaginal cream to apply each bedtime for 7-to 10 days. Will run her urine for a culture. Patient scheduled to see her PCP in the next few days we'll discuss the other symptoms with them.

## 2014-08-09 LAB — URINALYSIS W MICROSCOPIC + REFLEX CULTURE
Bilirubin Urine: NEGATIVE
CRYSTALS: NONE SEEN
Casts: NONE SEEN
Glucose, UA: NEGATIVE mg/dL
Hgb urine dipstick: NEGATIVE
Ketones, ur: NEGATIVE mg/dL
LEUKOCYTES UA: NEGATIVE
NITRITE: NEGATIVE
PROTEIN: NEGATIVE mg/dL
SPECIFIC GRAVITY, URINE: 1.009 (ref 1.005–1.030)
UROBILINOGEN UA: 0.2 mg/dL (ref 0.0–1.0)
pH: 5.5 (ref 5.0–8.0)

## 2014-08-10 ENCOUNTER — Other Ambulatory Visit: Payer: Self-pay | Admitting: Gynecology

## 2014-08-10 LAB — URINE CULTURE
Colony Count: NO GROWTH
ORGANISM ID, BACTERIA: NO GROWTH

## 2014-08-10 MED ORDER — CIPROFLOXACIN HCL 250 MG PO TABS: 250.0000 mg | ORAL_TABLET | Freq: Two times a day (BID) | ORAL | Status: DC

## 2014-08-11 LAB — URINE CULTURE

## 2014-08-13 ENCOUNTER — Encounter: Payer: Self-pay | Admitting: Family Medicine

## 2014-08-13 ENCOUNTER — Ambulatory Visit (INDEPENDENT_AMBULATORY_CARE_PROVIDER_SITE_OTHER): Payer: Medicare PPO | Admitting: Family Medicine

## 2014-08-13 VITALS — BP 118/80 | HR 86 | Temp 98.6°F | Resp 14 | Ht 66.0 in | Wt 201.0 lb

## 2014-08-13 DIAGNOSIS — R5381 Other malaise: Secondary | ICD-10-CM

## 2014-08-13 DIAGNOSIS — Z0000 Encounter for general adult medical examination without abnormal findings: Secondary | ICD-10-CM

## 2014-08-13 DIAGNOSIS — R5383 Other fatigue: Principal | ICD-10-CM

## 2014-08-13 LAB — WET PREP FOR TRICH, YEAST, CLUE
Clue Cells Wet Prep HPF POC: NONE SEEN
TRICH WET PREP: NONE SEEN
Yeast Wet Prep HPF POC: NONE SEEN

## 2014-08-13 NOTE — Telephone Encounter (Signed)
This encounter was created in error - please disregard.

## 2014-08-14 ENCOUNTER — Encounter: Payer: Self-pay | Admitting: Family Medicine

## 2014-08-14 LAB — CBC WITH DIFFERENTIAL/PLATELET
BASOS PCT: 1 % (ref 0–1)
Basophils Absolute: 0.1 10*3/uL (ref 0.0–0.1)
EOS ABS: 0.1 10*3/uL (ref 0.0–0.7)
EOS PCT: 1 % (ref 0–5)
HCT: 35.1 % — ABNORMAL LOW (ref 36.0–46.0)
Hemoglobin: 12.6 g/dL (ref 12.0–15.0)
Lymphocytes Relative: 48 % — ABNORMAL HIGH (ref 12–46)
Lymphs Abs: 2.9 10*3/uL (ref 0.7–4.0)
MCH: 31.1 pg (ref 26.0–34.0)
MCHC: 35.9 g/dL (ref 30.0–36.0)
MCV: 86.7 fL (ref 78.0–100.0)
Monocytes Absolute: 0.5 10*3/uL (ref 0.1–1.0)
Monocytes Relative: 8 % (ref 3–12)
Neutro Abs: 2.6 10*3/uL (ref 1.7–7.7)
Neutrophils Relative %: 42 % — ABNORMAL LOW (ref 43–77)
PLATELETS: 188 10*3/uL (ref 150–400)
RBC: 4.05 MIL/uL (ref 3.87–5.11)
RDW: 13.9 % (ref 11.5–15.5)
WBC: 6.1 10*3/uL (ref 4.0–10.5)

## 2014-08-14 LAB — TSH: TSH: 2.512 u[IU]/mL (ref 0.350–4.500)

## 2014-08-14 NOTE — Progress Notes (Signed)
Subjective:    Patient ID: Heather Hoover, female    DOB: 07-26-1946, 69 y.o.   MRN: [id number]  HPI Patient is here today to reestablish care and for complete physical exam. She has no medical concerns.  Past medical history is reviewed. Patient does complain of some mild fatigue but is otherwise doing well. Her last colonoscopy was performed in 2010 by Dr. Loreta Ave.  She is not due again until 2020. Her Pap smears performed her gynecologist and was performed earlier this year in May. Her mammogram is up-to-date. Her bone density was performed this year and revealed osteopenia. She is due for a Pneumovax 23. Zostavax and Prevnar 13 up-to-date. Past Medical History  Diagnosis Date  . 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 Hypertension   . Exogenous obesity   . Dyslipidemia   . Heart murmur   . Arthritis   . Osteopenia 06/2014    T score -1.1 FRAX 8.1%/0.7%  . Bone spur    Past Surgical History  Procedure Laterality Date  . Cardiac catheterization      when in 3rd grade and age 32  . Knee arthroscopy      right  . Childbirth      x 2 vaginal deliveries  . Appendectomy    . Forehead plastic surgery      MOH's surgery  . Oophorectomy      RSO  . Breast surgery    . Fracture surgery    . Cosmetic surgery    . Abdominal hysterectomy  02/07/1981    RSO   Current Outpatient Prescriptions on File Prior to Visit  Medication Sig Dispense Refill  . aspirin 81 MG tablet Take 81 mg by mouth daily.        . Cholecalciferol (VITAMIN D-3 PO) Take by mouth. Taking 2000 daily       . clindamycin (CLEOCIN) 2 % vaginal cream Place 1 Applicatorful vaginally at bedtime.  40 g  0  . CRESTOR 20 MG tablet TAKE 1 TABLET BY MOUTH DAILY  90 tablet  0  . hydrochlorothiazide (HYDRODIURIL) 25 MG tablet TAKE 1 TABLET BY MOUTH EVERY DAY.  90 tablet  0  . metoprolol tartrate (LOPRESSOR) 25 MG tablet TAKE 1 TABLET BY MOUTH EVERY DAY  90 tablet  3  . polycarbophil (FIBERCON)  625 MG tablet Take 625 mg by mouth daily.      . potassium chloride (K-DUR) 10 MEQ tablet TAKE 1 TABLET BY MOUTH EVERY DAY.  90 tablet  0  . Estradiol (VAGIFEM) 10 MCG TABS Place 1 tablet (10 mcg total) vaginally 2 (two) times a week.  24 tablet  4  . tolterodine (DETROL LA) 4 MG 24 hr capsule TAKE 1 CAPSULE BY MOUTH DAILY  30 capsule  11  . [DISCONTINUED] tolterodine (DETROL LA) 4 MG 24 hr capsule Take 1 capsule (4 mg total) by mouth daily.  30 capsule  11   No current facility-administered medications on file prior to visit.   Allergies  Allergen Reactions  . Augmentin [Amoxicillin-Pot Clavulanate]   . Niacin-Lovastatin Er     Flushing,itching,syncope   History   Social History  . Marital Status: Married    Spouse Name: N/A    Number of Children: N/A  . Years of Education: N/A   Occupational History  . Not on file.   Social History Main Topics  . Smoking status: Never Smoker   . Smokeless tobacco: Never Used  .  Alcohol Use: 0.6 oz/week    1 Cans of beer per week     Comment: rarely  . Drug Use: No  . Sexual Activity: Yes    Birth Control/ Protection: Post-menopausal   Other Topics Concern  . Not on file   Social History Narrative  . No narrative on file   Family History  Problem Relation Age of Onset  . Hypertension Mother   . Breast cancer Mother 62    Breast   . Cancer Mother     uterine thye think  . Heart disease Mother   . Cancer Father 90    prostate metastized to bone  . Hypertension Sister   . Breast cancer Sister 5  . Cancer Sister 26    uterine  . Diabetes Maternal Grandfather   . Diabetes Paternal Grandmother       Review of Systems  All other systems reviewed and are negative.      Objective:   Physical Exam  Vitals reviewed. Constitutional: She is oriented to person, place, and time. She appears well-developed and well-nourished. No distress.  HENT:  Head: Normocephalic and atraumatic.  Right Ear: External ear normal.  Left Ear:  External ear normal.  Nose: Nose normal.  Mouth/Throat: Oropharynx is clear and moist. No oropharyngeal exudate.  Eyes: Conjunctivae and EOM are normal. Pupils are equal, round, and reactive to light. Right eye exhibits no discharge. Left eye exhibits no discharge. No scleral icterus.  Neck: Normal range of motion. Neck supple. No JVD present. No tracheal deviation present. No thyromegaly present.  Cardiovascular: Normal rate, regular rhythm, normal heart sounds and intact distal pulses.  Exam reveals no gallop and no friction rub.   No murmur heard. Pulmonary/Chest: Effort normal and breath sounds normal. No stridor. No respiratory distress. She has no wheezes. She has no rales. She exhibits no tenderness.  Abdominal: Soft. Bowel sounds are normal. She exhibits no distension and no mass. There is no tenderness. There is no rebound and no guarding.  Musculoskeletal: Normal range of motion. She exhibits no edema and no tenderness.  Lymphadenopathy:    She has no cervical adenopathy.  Neurological: She is alert and oriented to person, place, and time. She has normal reflexes. She displays normal reflexes. No cranial nerve deficit. She exhibits normal muscle tone. Coordination normal.  Skin: Skin is warm. No rash noted. She is not diaphoretic. No erythema. No pallor.  Psychiatric: She has a normal mood and affect. Her behavior is normal. Judgment and thought content normal.          Assessment & Plan:  Other malaise and fatigue - Plan: CBC with Differential, TSH  Routine general medical examination at a health care facility  physical exam is completely normal. I didn't recommend increasing aerobic exercise and weight loss.  I reviewed most recent lab work obtained earlier by Dr. [name].  Cholesterol is excellent. I will add a CBC as well as a TSH today due to her fatigue. Immunizations are up to date. Patient to be due for her Pneumovax 23 at her next physical. Cancer screening is up to  date. Regular anticipatory guidance is provided.  Recheck in 1 year.

## 2014-08-22 ENCOUNTER — Other Ambulatory Visit: Payer: Self-pay | Admitting: Cardiology

## 2014-08-29 ENCOUNTER — Other Ambulatory Visit: Payer: Self-pay | Admitting: Cardiology

## 2014-09-10 ENCOUNTER — Encounter: Payer: Self-pay | Admitting: Gynecology

## 2014-10-09 ENCOUNTER — Other Ambulatory Visit (INDEPENDENT_AMBULATORY_CARE_PROVIDER_SITE_OTHER): Payer: Medicare PPO | Admitting: *Deleted

## 2014-10-09 DIAGNOSIS — E876 Hypokalemia: Secondary | ICD-10-CM

## 2014-10-09 DIAGNOSIS — I119 Hypertensive heart disease without heart failure: Secondary | ICD-10-CM

## 2014-10-09 DIAGNOSIS — E7800 Pure hypercholesterolemia, unspecified: Secondary | ICD-10-CM

## 2014-10-09 LAB — BASIC METABOLIC PANEL
BUN: 11 mg/dL (ref 6–23)
CO2: 27 meq/L (ref 19–32)
CREATININE: 0.6 mg/dL (ref 0.4–1.2)
Calcium: 9.5 mg/dL (ref 8.4–10.5)
Chloride: 102 mEq/L (ref 96–112)
GFR: 100.09 mL/min (ref >60.00)
Glucose, Bld: 98 mg/dL (ref 70–99)
Potassium: 3.8 mEq/L (ref 3.5–5.1)
Sodium: 139 mEq/L (ref 135–145)

## 2014-10-09 LAB — LIPID PANEL
Cholesterol: 161 mg/dL (ref 0–200)
HDL: 48.3 mg/dL (ref >39.00)
LDL Cholesterol: 78 mg/dL (ref 0–99)
NONHDL: 112.7
Total CHOL/HDL Ratio: 3
Triglycerides: 172 mg/dL — ABNORMAL HIGH (ref 0.0–149.0)
VLDL: 34.4 mg/dL (ref 0.0–40.0)

## 2014-10-09 LAB — HEPATIC FUNCTION PANEL
ALK PHOS: 52 U/L (ref 39–117)
ALT: 16 U/L (ref 0–35)
AST: 25 U/L (ref 0–37)
Albumin: 3.6 g/dL (ref 3.5–5.2)
Bilirubin, Direct: 0.1 mg/dL (ref 0.0–0.3)
Total Bilirubin: 0.4 mg/dL (ref 0.2–1.2)
Total Protein: 7.5 g/dL (ref 6.0–8.3)

## 2014-10-09 NOTE — Progress Notes (Signed)
Quick Note:  Please make copy of labs for patient visit. ______ 

## 2014-10-16 ENCOUNTER — Ambulatory Visit (INDEPENDENT_AMBULATORY_CARE_PROVIDER_SITE_OTHER): Payer: Medicare PPO | Admitting: Cardiology

## 2014-10-16 ENCOUNTER — Encounter: Payer: Self-pay | Admitting: Cardiology

## 2014-10-16 VITALS — BP 120/82 | HR 85 | Ht 66.0 in | Wt 198.0 lb

## 2014-10-16 DIAGNOSIS — E7800 Pure hypercholesterolemia, unspecified: Secondary | ICD-10-CM

## 2014-10-16 DIAGNOSIS — E876 Hypokalemia: Secondary | ICD-10-CM

## 2014-10-16 DIAGNOSIS — Q221 Congenital pulmonary valve stenosis: Secondary | ICD-10-CM

## 2014-10-16 DIAGNOSIS — E785 Hyperlipidemia, unspecified: Secondary | ICD-10-CM

## 2014-10-16 DIAGNOSIS — I119 Hypertensive heart disease without heart failure: Secondary | ICD-10-CM

## 2014-10-16 DIAGNOSIS — I370 Nonrheumatic pulmonary valve stenosis: Secondary | ICD-10-CM

## 2014-10-16 NOTE — Assessment & Plan Note (Signed)
We reviewed her lab work.  Triglycerides and blood sugar are higher.  She will work harder on low carbohydrate diet and avoid white things.

## 2014-10-16 NOTE — Assessment & Plan Note (Signed)
Her blood pressure has been remaining stable on current therapy. 

## 2014-10-16 NOTE — Assessment & Plan Note (Signed)
She has not had any symptoms from her pulmonic stenosis.  No chest pain or shortness of breath.  No evidence of right heart failure.

## 2014-10-16 NOTE — Patient Instructions (Signed)
Your physician recommends that you continue on your current medications as directed. Please refer to the Current Medication list given to you today.  Your physician wants you to follow-up in: 6 months with fasting labs (lp/bmet/hfp)  You will receive a reminder letter in the mail two months in advance. If you don't receive a letter, please call our office to schedule the follow-up appointment.  

## 2014-10-16 NOTE — Progress Notes (Signed)
Heather Hoover Date of Birth:  1946/05/18 [facility] [address] [place], [place]  [id number] [phone]        Fax   [phone]   History of Present Illness: This pleasant 68 year old woman is seen for a scheduled followup office visit. She has a history of congenital pulmonic stenosis.  She had a cardiac catheterization when she was in the third grade.  [facility].  He had a second cardiac catheter[place] at age 68 [place].  Both catheterizations showed that the pulmonic stenosis was mild. She has a history of high blood pressure and high cholesterol. Since last visit she has been feeling well. She has not been having chest pain or shortness of breath.  She had a difficult summer.  She was sick most of the summer.  She started out with a sinus infection and then had side effects from Augmentin which she received and which caused her to have diarrhea.  She then had 2 additional rounds of antibiotics for a urinary tract infection.  As a result she did not get much exercise.  She has gained 2 more pounds.  Current Outpatient Prescriptions  Medication Sig Dispense Refill  . aspirin 81 MG tablet Take 81 mg by mouth daily.        . Cholecalciferol (VITAMIN D-3 PO) Take by mouth. Taking 2000 daily       . CRESTOR 20 MG tablet TAKE 1 TABLET BY MOUTH EVERY DAY  90 tablet  0  . FLUZONE HIGH-DOSE 0.5 ML SUSY       . hydrochlorothiazide (HYDRODIURIL) 25 MG tablet TAKE 1 TABLET BY MOUTH EVERY DAY  90 tablet  0  . metoprolol tartrate (LOPRESSOR) 25 MG tablet TAKE 1 TABLET BY MOUTH EVERY DAY  90 tablet  3  . polycarbophil (FIBERCON) 625 MG tablet Take 625 mg by mouth daily.      . potassium chloride (K-DUR) 10 MEQ tablet TAKE 1 TABLET BY MOUTH EVERY DAY  90 tablet  0  . Probiotic Product (PROBIOTIC DAILY PO) Take by mouth daily.      . [DISCONTINUED] tolterodine (DETROL LA) 4 MG 24 hr capsule Take 1 capsule (4 mg total) by mouth  daily.  30 capsule  11   No current facility-administered medications for this visit.    Allergies  Allergen Reactions  . Augmentin [Amoxicillin-Pot Clavulanate]   . Niacin-Lovastatin Er     Flushing,itching,syncope    Patient Active Problem List   Diagnosis Date Noted  . 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 Exogenous obesity   . Dyslipidemia   . Arthritis   . 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 Hypercholesterolemia 08/17/2011  . Benign hypertensive heart disease without heart failure 08/17/2011    History  Smoking status  . Never Smoker   Smokeless tobacco  . Never Used    History  Alcohol Use  . 0.6 oz/week  . 1 Cans of beer per week    Comment: rarely    Family History  Problem Relation Age of Onset  . Hypertension Mother   . Breast cancer Mother [id number]    Breast   . Cancer Mother     uterine thye think  . Heart disease Mother   . Cancer Father [id number]    prostate metastized to bone  . Hypertension Sister   . Breast cancer Sister [id number]  . Cancer Sister 5155    uterine  .  Diabetes Maternal Grandfather   . Diabetes Paternal Grandmother     Review of Systems: Constitutional: no fever chills diaphoresis or fatigue or change in weight.  Head and neck: no hearing loss, no epistaxis, no photophobia or visual disturbance. Respiratory: No cough, shortness of breath or wheezing. Cardiovascular: No chest pain peripheral edema, palpitations. Gastrointestinal: No abdominal distention, no abdominal pain, no change in bowel habits hematochezia or melena. Genitourinary: No dysuria, no frequency, no urgency, no nocturia. Musculoskeletal:No arthralgias, no back pain, no gait disturbance or myalgias. Neurological: No dizziness, no headaches, no numbness, no seizures, no syncope, no weakness, no tremors. Hematologic: No lymphadenopathy, no easy bruising. Psychiatric: No confusion, no hallucinations, no sleep disturbance.    Physical Exam: Filed Vitals:    10/16/14 1558  BP: 120/82  Pulse: 85  The patient appears to be in no distress.  Head and neck exam reveals that the pupils are equal and reactive.  The extraocular movements are full.  There is no scleral icterus.  Mouth and pharynx are benign.  No lymphadenopathy.  No carotid bruits.  The jugular venous pressure is normal.  Thyroid is not enlarged or tender.  Chest is clear to percussion and auscultation.  No rales or rhonchi.  Expansion of the chest is symmetrical.  Heart reveals no abnormal lift or heave.  First and second heart sounds are normal.  There is grade 2/6 systolic ejection murmur at the pulmonic area.  The abdomen is soft and nontender.  Bowel sounds are normoactive.  There is no hepatosplenomegaly or mass.  There are no abdominal bruits.  Extremities reveal no phlebitis or edema.  Pedal pulses are good.  There is no cyanosis or clubbing.  Neurologic exam is normal strength and no lateralizing weakness.  No sensory deficits.  Integument reveals no rash    Assessment / Plan: 1.  Congenital pulmonic stenosis, mild 2. benign hypertensive heart disease without heart failure 3. Dyslipidemia 4. Obesity  Disposition: Continue on same medication.  Recheck in 6 months for office visit EKG lipid panel hepatic function panel and basal metabolic panel. Encouraged her to get back into her routine of working [facility] [employer]

## 2014-10-22 ENCOUNTER — Encounter: Payer: Self-pay | Admitting: Cardiology

## 2014-10-31 ENCOUNTER — Ambulatory Visit (INDEPENDENT_AMBULATORY_CARE_PROVIDER_SITE_OTHER): Payer: Medicare PPO | Admitting: Gynecology

## 2014-10-31 ENCOUNTER — Encounter: Payer: Self-pay | Admitting: Gynecology

## 2014-10-31 DIAGNOSIS — N898 Other specified noninflammatory disorders of vagina: Secondary | ICD-10-CM

## 2014-10-31 DIAGNOSIS — N952 Postmenopausal atrophic vaginitis: Secondary | ICD-10-CM

## 2014-10-31 DIAGNOSIS — R300 Dysuria: Secondary | ICD-10-CM

## 2014-10-31 DIAGNOSIS — L298 Other pruritus: Secondary | ICD-10-CM

## 2014-10-31 LAB — WET PREP FOR TRICH, YEAST, CLUE
Clue Cells Wet Prep HPF POC: NONE SEEN
Trich, Wet Prep: NONE SEEN
Yeast Wet Prep HPF POC: NONE SEEN

## 2014-10-31 LAB — URINALYSIS W MICROSCOPIC + REFLEX CULTURE
BILIRUBIN URINE: NEGATIVE
Glucose, UA: NEGATIVE mg/dL
Hgb urine dipstick: NEGATIVE
Ketones, ur: NEGATIVE mg/dL
Leukocytes, UA: NEGATIVE
Nitrite: NEGATIVE
Protein, ur: NEGATIVE mg/dL
SPECIFIC GRAVITY, URINE: 1.01 (ref 1.005–1.030)
UROBILINOGEN UA: 0.2 mg/dL (ref 0.0–1.0)
pH: 7.5 (ref 5.0–8.0)

## 2014-10-31 MED ORDER — ESTRADIOL 10 MCG VA TABS: 1.0000 | ORAL_TABLET | VAGINAL | Status: DC

## 2014-10-31 NOTE — Patient Instructions (Signed)
Start back on the Vagifem twice weekly. Call me if your symptoms persist

## 2014-10-31 NOTE — Progress Notes (Signed)
Heather Hoover 06-29-1946 [id number]        68 y.o.  [id number] Presents complaining of several weeks of vaginal itching and some dysuria. Described as an stream stinging. No urgency/incontinence. No frequency, fever chills or low back pain. Had been on Vagifem previously but discontinued this past June. No significant discharge.  Past medical history,surgical history, problem list, medications, allergies, family history and social history were all reviewed and documented in the EPIC chart.  Directed ROS with pertinent positives and negatives documented in the history of present illness/assessment and plan.  Exam: Kim assistant General appearance:  Normal Abdomen soft nontender without masses guarding rebound organomegaly Pelvic external BUS vagina with atrophic changes. First-degree cystocele and rectocele noted. Vaginal cuff well supported. No significant discharge. Bimanual without masses or tenderness  Assessment/Plan:  68 y.o. [id number] with above symptoms with urinalysis and wet prep negative. I suspect her symptoms may be secondary to atrophic changes. Options reviewed to include reinitiation of Vagifem. She did well before using this. I again reviewed the risks of absorption and systemic side effects such as thrombosis with stroke heart attack DVT.  Patient understands and accepts and will go ahead and initiate twice weekly Vagifem and see how she does. She'll follow up if her symptoms persist.     Dara LordsFONTAINE,Kasean Denherder P MD, 11:26 AM 10/31/2014

## 2014-11-18 ENCOUNTER — Other Ambulatory Visit: Payer: Self-pay | Admitting: Cardiology

## 2014-11-24 ENCOUNTER — Other Ambulatory Visit: Payer: Self-pay | Admitting: Cardiology

## 2014-11-24 NOTE — Telephone Encounter (Signed)
Rx was sent to pharmacy electronically. 

## 2015-01-15 ENCOUNTER — Ambulatory Visit (INDEPENDENT_AMBULATORY_CARE_PROVIDER_SITE_OTHER): Payer: Medicare PPO | Admitting: Family Medicine

## 2015-01-15 ENCOUNTER — Encounter: Payer: Self-pay | Admitting: Family Medicine

## 2015-01-15 VITALS — BP 130/78 | HR 82 | Temp 98.7°F | Resp 16 | Ht 66.0 in | Wt 202.0 lb

## 2015-01-15 DIAGNOSIS — H9202 Otalgia, left ear: Secondary | ICD-10-CM

## 2015-01-15 MED ORDER — MELOXICAM 15 MG PO TABS: 15.0000 mg | ORAL_TABLET | Freq: Every day | ORAL | Status: DC

## 2015-01-15 MED ORDER — FLUTICASONE PROPIONATE 50 MCG/ACT NA SUSP: 2.0000 | Freq: Every day | NASAL | Status: DC

## 2015-01-15 MED ORDER — DIAZEPAM 10 MG PO TABS: 10.0000 mg | ORAL_TABLET | Freq: Two times a day (BID) | ORAL | Status: DC | PRN

## 2015-01-15 NOTE — Progress Notes (Signed)
Subjective:    Patient ID: Heather Hoover, female    DOB: 09/27/1946, 69 y.o.   MRN: [id number]  HPI  Patient has had intermittent left ear pain for over a month. It comes and goes. It is an aching sore pressure deep within the left ear. She denies any decreased hearing or tinnitus or vertigo. She denies any headache or vision changes. She denies any trismus. She does occasionally have pain in her ear with chewing. She denies any fevers or chills or sinus pain. She does have some rhinorrhea.  She has been under a lot of stress recently as her daughter is pregnant and has a placenta previa. Past Medical History  Diagnosis Date  . 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 Hypertension   . Exogenous obesity   . Dyslipidemia   . Heart murmur   . Arthritis   . Osteopenia 06/2014    T score -1.1 FRAX 8.1%/0.7%  . Bone spur    Past Surgical History  Procedure Laterality Date  . Cardiac catheterization      when in 3rd grade and age 69  . Knee arthroscopy      right  . Childbirth      x 2 vaginal deliveries  . Appendectomy    . Forehead plastic surgery      MOH's surgery  . Oophorectomy      RSO  . Breast surgery    . Fracture surgery    . Cosmetic surgery    . Abdominal hysterectomy  02/07/1981    RSO   Current Outpatient Prescriptions on File Prior to Visit  Medication Sig Dispense Refill  . aspirin 81 MG tablet Take 81 mg by mouth daily.      . Cholecalciferol (VITAMIN D-3 PO) Take by mouth. Taking 2000 daily     . CRESTOR 20 MG tablet TAKE 1 TABLET BY MOUTH EVERY DAY 90 tablet 1  . Estradiol (VAGIFEM) 10 MCG TABS vaginal tablet Place 1 tablet (10 mcg total) vaginally 2 (two) times a week. 8 tablet 6  . hydrochlorothiazide (HYDRODIURIL) 25 MG tablet TAKE 1 TABLET BY MOUTH EVERY DAY 90 tablet 1  . metoprolol tartrate (LOPRESSOR) 25 MG tablet TAKE 1 TABLET BY MOUTH EVERY DAY 90 tablet 3  . polycarbophil (FIBERCON) 625 MG tablet Take 625 mg by  mouth daily.    . potassium chloride (K-DUR) 10 MEQ tablet TAKE 1 TABLET BY MOUTH EVERY DAY 90 tablet 1  . Probiotic Product (PROBIOTIC DAILY PO) Take by mouth daily.    . [DISCONTINUED] tolterodine (DETROL LA) 4 MG 24 hr capsule Take 1 capsule (4 mg total) by mouth daily. 30 capsule 11   No current facility-administered medications on file prior to visit.   Allergies  Allergen Reactions  . Augmentin [Amoxicillin-Pot Clavulanate] Diarrhea  . Niacin-Lovastatin Er     Flushing,itching,syncope   History   Social History  . Marital Status: Married    Spouse Name: N/A    Number of Children: N/A  . Years of Education: N/A   Occupational History  . Not on file.   Social History Main Topics  . Smoking status: Never Smoker   . Smokeless tobacco: Never Used  . Alcohol Use: 0.6 oz/week    1 Cans of beer per week     Comment: rarely  . Drug Use: No  . Sexual Activity: Yes    Birth Control/ Protection: Post-menopausal   Other Topics Concern  .  Not on file   Social History Narrative     Review of Systems  All other systems reviewed and are negative.      Objective:   Physical Exam  Constitutional: She appears well-developed and well-nourished. No distress.  HENT:  Head: Normocephalic and atraumatic.  Right Ear: Hearing, tympanic membrane, external ear and ear canal normal.  Left Ear: Hearing, tympanic membrane, external ear and ear canal normal.  Nose: Mucosal edema and rhinorrhea present.  Mouth/Throat: Oropharynx is clear and moist. No oropharyngeal exudate.  Eyes: Conjunctivae are normal.  Neck: Neck supple.  Cardiovascular: Normal rate, regular rhythm and normal heart sounds.   Pulmonary/Chest: Effort normal and breath sounds normal. No respiratory distress. She has no wheezes. She has no rales.  Lymphadenopathy:    She has no cervical adenopathy.  Skin: She is not diaphoretic.  Vitals reviewed.         Assessment & Plan:  Otalgia of left ear - Plan:  meloxicam (MOBIC) 15 MG tablet, diazepam (VALIUM) 10 MG tablet, fluticasone (FLONASE) 50 MCG/ACT nasal spray  There is no evidence of an ear infection. Differential diagnosis includes eustachian tube dysfunction versus TMJ. There are no symptoms to suggest intracranial space-occupying lesion. 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 Also recommended the patient wear a mouthguard at night. Recheck in one week if no better or sooner if worse. I did give the patient some Flonase today for her nasal congestion.

## 2015-01-17 ENCOUNTER — Encounter: Payer: Self-pay | Admitting: Family Medicine

## 2015-01-24 ENCOUNTER — Telehealth: Payer: Self-pay | Admitting: *Deleted

## 2015-01-24 NOTE — Telephone Encounter (Signed)
Left message for pt to call.

## 2015-01-24 NOTE — Telephone Encounter (Signed)
It is a long-term commitment. Whenever she stops the treatment the symptoms probably will recur. Only 2 other choices besides the vaginal cream/Vagifem are Osphena which is a pill she takes every day which is expensive and is new so long-term safety data is not available and over-the-counter vaginal moisturizer products such as Replens that she uses twice weekly.

## 2015-01-24 NOTE — Telephone Encounter (Signed)
Vagifem 10 mcg has increase, pt okay paying for medication now but long term this will be a problem. 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 Please advise

## 2015-01-24 NOTE — Telephone Encounter (Signed)
Pt will continue with Vagifem will have 90 day supply it will be cheaper.

## 2015-04-04 ENCOUNTER — Other Ambulatory Visit (INDEPENDENT_AMBULATORY_CARE_PROVIDER_SITE_OTHER): Payer: Medicare PPO | Admitting: *Deleted

## 2015-04-04 DIAGNOSIS — E7800 Pure hypercholesterolemia, unspecified: Secondary | ICD-10-CM

## 2015-04-04 DIAGNOSIS — I119 Hypertensive heart disease without heart failure: Secondary | ICD-10-CM | POA: Diagnosis not present

## 2015-04-04 LAB — BASIC METABOLIC PANEL
BUN: 13 mg/dL (ref 6–23)
CALCIUM: 9.4 mg/dL (ref 8.4–10.5)
CO2: 33 meq/L — AB (ref 19–32)
Chloride: 100 mEq/L (ref 96–112)
Creatinine, Ser: 0.73 mg/dL (ref 0.40–1.20)
GFR: 84.31 mL/min (ref >60.00)
Glucose, Bld: 96 mg/dL (ref 70–99)
Potassium: 3.4 mEq/L — ABNORMAL LOW (ref 3.5–5.1)
Sodium: 137 mEq/L (ref 135–145)

## 2015-04-04 LAB — HEPATIC FUNCTION PANEL
ALBUMIN: 4 g/dL (ref 3.5–5.2)
ALT: 16 U/L (ref 0–35)
AST: 22 U/L (ref 0–37)
Alkaline Phosphatase: 51 U/L (ref 39–117)
Bilirubin, Direct: 0.1 mg/dL (ref 0.0–0.3)
TOTAL PROTEIN: 6.9 g/dL (ref 6.0–8.3)
Total Bilirubin: 0.5 mg/dL (ref 0.2–1.2)

## 2015-04-04 LAB — LIPID PANEL
CHOL/HDL RATIO: 3
Cholesterol: 146 mg/dL (ref 0–200)
HDL: 56.4 mg/dL (ref >39.00)
LDL CALC: 70 mg/dL (ref 0–99)
NonHDL: 89.6
Triglycerides: 100 mg/dL (ref 0.0–149.0)
VLDL: 20 mg/dL (ref 0.0–40.0)

## 2015-04-04 NOTE — Progress Notes (Signed)
Quick Note:  Please make copy of labs for patient visit. ______ 

## 2015-04-11 ENCOUNTER — Encounter: Payer: Self-pay | Admitting: Cardiology

## 2015-04-11 ENCOUNTER — Ambulatory Visit (INDEPENDENT_AMBULATORY_CARE_PROVIDER_SITE_OTHER): Payer: Medicare PPO | Admitting: Cardiology

## 2015-04-11 VITALS — BP 152/80 | HR 75 | Ht 66.0 in | Wt 200.8 lb

## 2015-04-11 DIAGNOSIS — E7800 Pure hypercholesterolemia, unspecified: Secondary | ICD-10-CM

## 2015-04-11 DIAGNOSIS — Q221 Congenital pulmonary valve stenosis: Secondary | ICD-10-CM

## 2015-04-11 DIAGNOSIS — I119 Hypertensive heart disease without heart failure: Secondary | ICD-10-CM

## 2015-04-11 DIAGNOSIS — E876 Hypokalemia: Secondary | ICD-10-CM

## 2015-04-11 NOTE — Patient Instructions (Signed)
Medication Instructions:  Your physician recommends that you continue on your current medications as directed. Please refer to the Current Medication list given to you today.  Labwork: none  Testing/Procedures: none  Follow-Up: Your physician wants you to follow-up in: 6 months with fasting labs (lp/bmet/hfp) and ekg  You will receive a reminder letter in the mail two months in advance. If you don't receive a letter, please call our office to schedule the follow-up appointment.   

## 2015-04-11 NOTE — Progress Notes (Signed)
Cardiology Office Note   Date:  04/11/2015   ID:  CAROLIN Hoover, Heather Hoover [birth year], MRN [id number]  PCP:  Leo Grosser, MD  Cardiologist:   Cassell Clement, MD   No chief complaint on file.     History of Present Illness: Heather Hoover is a 69 y.o. female who presents for a six-month follow-up office visit  This pleasant 69 year old woman is seen for a scheduled followup office visit. She has a history of congenital pulmonic stenosis. She had a cardiac catheterization when she was in the third grade. It was done at the [facility] clinic. He had a second cardiac catheterization at age 46 done in [place]. Both catheterizations showed that the pulmonic stenosis was mild. She has a history of high blood pressure and high cholesterol. Since last visit she has been feeling well. She has not been having chest pain or shortness of breath.  She has been under a lot of recent stress.  Her sister was having any procedure today.  Her daughter had placenta previa and finally delivered a premature infant who spent a month in the NICU for coming home. The patient has not been able to get to the [facility] on a regular basis because of other commitments to her family. She has not been having any chest pain or shortness of breath. She has had a recent problem with TMJ and has had a trial of Valium and a trial of meloxicam.  Past Medical History  Diagnosis Date  . 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 Hypertension   . Exogenous obesity   . Dyslipidemia   . Heart murmur   . Arthritis   . Osteopenia 06/2014    T score -1.1 FRAX 8.1%/0.7%  . Bone spur     Past Surgical History  Procedure Laterality Date  . Cardiac catheterization      when in 3rd grade and age 22  . Knee arthroscopy      right  . Childbirth      x 2 vaginal deliveries  . Appendectomy    . Forehead plastic surgery      MOH's surgery  . Oophorectomy      RSO  .  Breast surgery    . Fracture surgery    . Cosmetic surgery    . Abdominal hysterectomy  02/07/1981    RSO     Current Outpatient Prescriptions  Medication Sig Dispense Refill  . aspirin 81 MG tablet Take 81 mg by mouth daily.      . Cholecalciferol (VITAMIN D-3 PO) Take 2,000 Units by mouth daily. Taking 2000 daily    . CRESTOR 20 MG tablet TAKE 1 TABLET BY MOUTH EVERY DAY 90 tablet 1  . diazepam (VALIUM) 10 MG tablet Take 1 tablet (10 mg total) by mouth every 12 (twelve) hours as needed for anxiety. 30 tablet 1  . Estradiol (VAGIFEM) 10 MCG TABS vaginal tablet Place 1 tablet (10 mcg total) vaginally 2 (two) times a week. 8 tablet 6  . fluticasone (FLONASE) 50 MCG/ACT nasal spray Place 2 sprays into both nostrils daily. 16 g 6  . hydrochlorothiazide (HYDRODIURIL) 25 MG tablet TAKE 1 TABLET BY MOUTH EVERY DAY 90 tablet 1  . meloxicam (MOBIC) 15 MG tablet Take 1 tablet (15 mg total) by mouth daily. 30 tablet 0  . metoprolol tartrate (LOPRESSOR) 25 MG tablet TAKE 1 TABLET BY MOUTH EVERY DAY 90 tablet 3  . potassium chloride (  K-DUR) 10 MEQ tablet TAKE 1 TABLET BY MOUTH EVERY DAY 90 tablet 1  . Probiotic Product (PROBIOTIC DAILY PO) Take by mouth daily.     No current facility-administered medications for this visit.    Allergies:   Augmentin and Niacin-lovastatin er    Social History:  The patient  reports that she has never smoked. She has never used smokeless tobacco. She reports that she drinks about 0.6 oz of alcohol per week. She reports that she does not use illicit drugs.   Family History:  The patient's family history includes Breast cancer (age of onset: 65) in her sister; Breast cancer (age of onset: 880) in her mother; Cancer in her mother; Cancer (age of onset: 5455) in her sister; Cancer (age o22f onset: 6878) in her father; Diabetes in her maternal grandfather and paternal grandmother; Heart disease in her mother; Hypertension in her mother and sister.    ROS:  Please see the  history of present illness.   Otherwise, review of systems are positive for none.   All other systems are reviewed and negative.    PHYSICAL EXAM: VS:  BP 152/80 mmHg  Pulse 75  Ht 5\' 6"  (1.676 m)  Wt 200 lb 12.8 oz (91.082 kg)  BMI 32.43 kg/m2 , BMI Body mass index is 32.43 kg/(m^2). GEN: Well nourished, well developed, in no acute distress HEENT: normal Neck: no JVD, carotid bruits, or masses Cardiac: RRR; there is a grade 2/6 systolic ejection murmur across the pulmonic area.  No rubs, or gallops,no edema  Respiratory:  clear to auscultation bilaterally, normal work of breathing GI: soft, nontender, nondistended, + BS MS: no deformity or atrophy Skin: warm and dry, no rash Neuro:  Strength and sensation are intact Psych: euthymic mood, full affect   EKG:  EKG is not ordered today.    Recent Labs: 08/13/2014: Hemoglobin 12.6; Platelets 188; TSH 2.512 04/04/2015: ALT 16; BUN 13; Creatinine 0.73; Potassium 3.4*; Sodium 137    Lipid Panel    Component Value Date/Time   CHOL 146 04/04/2015 0857   TRIG 100.0 04/04/2015 0857   HDL 56.40 04/04/2015 0857   CHOLHDL 3 04/04/2015 0857   VLDL 20.0 04/04/2015 0857   LDLCALC 70 04/04/2015 0857      Wt Readings from Last 3 Encounters:  04/11/15 200 lb 12.8 oz (91.082 kg)  01/15/15 202 lb (91.627 kg)  10/16/14 198 lb (89.812 kg)        ASSESSMENT AND PLAN:  1. Congenital pulmonic stenosis, mild 2. benign hypertensive heart disease without heart failure.  Typically blood pressure at home is normal when she checks it. 3. Dyslipidemia 4. Obesity  Disposition: Continue on same medication. Recheck in 6 months for office visit EKG lipid panel hepatic function panel and basal metabolic panel. Encouraged her to get back into her routine of [facility] [employer]   Current medicines are reviewed at length with the patient today.  The patient does not have concerns regarding medicines.  The following changes have been made:  no  change  Labs/ tests ordered today include:  No orders of the defined types were placed in this encounter.     Disposition: Continue current medication.  Recheck in 6 months for office visit EKG lipid panel hepatic function panel and basal metabolic panel.  Signed, Cassell Clementhomas Jariel Drost, MD  04/11/2015 1:22 PM    [facility] [address], [place], [place]  [address] Phone: [phone]; Fax: [phone]

## 2015-04-15 ENCOUNTER — Other Ambulatory Visit: Payer: Self-pay | Admitting: Family Medicine

## 2015-04-15 NOTE — Telephone Encounter (Signed)
Medication refilled per protocol. 

## 2015-04-26 DIAGNOSIS — L821 Other seborrheic keratosis: Secondary | ICD-10-CM | POA: Diagnosis not present

## 2015-04-26 DIAGNOSIS — Z85828 Personal history of other malignant neoplasm of skin: Secondary | ICD-10-CM | POA: Diagnosis not present

## 2015-04-26 DIAGNOSIS — L570 Actinic keratosis: Secondary | ICD-10-CM | POA: Diagnosis not present

## 2015-04-26 DIAGNOSIS — L738 Other specified follicular disorders: Secondary | ICD-10-CM | POA: Diagnosis not present

## 2015-04-26 DIAGNOSIS — L578 Other skin changes due to chronic exposure to nonionizing radiation: Secondary | ICD-10-CM | POA: Diagnosis not present

## 2015-04-26 DIAGNOSIS — D1801 Hemangioma of skin and subcutaneous tissue: Secondary | ICD-10-CM | POA: Diagnosis not present

## 2015-04-29 ENCOUNTER — Telehealth: Payer: Self-pay | Admitting: *Deleted

## 2015-04-29 ENCOUNTER — Other Ambulatory Visit: Payer: Self-pay | Admitting: Cardiology

## 2015-04-29 MED ORDER — ESTRADIOL 10 MCG VA TABS: 1.0000 | ORAL_TABLET | VAGINAL | Status: DC

## 2015-04-29 NOTE — Telephone Encounter (Signed)
Pt called requesting a 90 day supply on vagifem 10 mcg it would be cheaper. Rx sent per pt requesting, annual scheduled on 06/14/15

## 2015-05-14 ENCOUNTER — Other Ambulatory Visit: Payer: Self-pay | Admitting: Cardiology

## 2015-05-20 ENCOUNTER — Other Ambulatory Visit: Payer: Self-pay | Admitting: Cardiology

## 2015-06-14 ENCOUNTER — Encounter: Payer: Medicare PPO | Admitting: Gynecology

## 2015-07-09 ENCOUNTER — Encounter: Payer: Self-pay | Admitting: Family Medicine

## 2015-07-09 ENCOUNTER — Ambulatory Visit (INDEPENDENT_AMBULATORY_CARE_PROVIDER_SITE_OTHER): Payer: Medicare PPO | Admitting: Family Medicine

## 2015-07-09 VITALS — BP 132/78 | HR 76 | Temp 98.3°F | Resp 18 | Ht 66.0 in | Wt 202.0 lb

## 2015-07-09 DIAGNOSIS — M5030 Other cervical disc degeneration, unspecified cervical region: Secondary | ICD-10-CM

## 2015-07-09 DIAGNOSIS — R1032 Left lower quadrant pain: Secondary | ICD-10-CM

## 2015-07-09 DIAGNOSIS — M47812 Spondylosis without myelopathy or radiculopathy, cervical region: Secondary | ICD-10-CM | POA: Diagnosis not present

## 2015-07-09 MED ORDER — DICLOFENAC SODIUM 75 MG PO TBEC: 75.0000 mg | DELAYED_RELEASE_TABLET | Freq: Two times a day (BID) | ORAL | Status: DC

## 2015-07-09 NOTE — Progress Notes (Signed)
Subjective:    Patient ID: Heather Hoover, female    DOB: July 29, 1946, 69 y.o.   MRN: [id number]  HPI A she has 2 main problems. First she's been having intermittent left lower quadrant abdominal pain for the last year. The pain is located just above the inguinal canal. There is no exacerbating or alleviating factors. It comes and goes. It does not seem to be associated with bowel movements. There is no melanoma or hematochezia. Colonoscopy was performed in 2010 and was normal per the patient report. She denies any weight loss fevers or chills. She denies any nausea or vomiting. 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  Please see a copy of the MRI dictated below: Findings: The visualized intracranial contents are normal. Cervical spinal cord is normal with no mass lesion, myelopathy, or spinal cord compression. Normal paraspinal soft tissues.  C2-3: 1.5 mm spondylolisthesis. No bulging of the disc. No facet arthritis.  C3-4: Minimal endplate osteophytes to the right and left of midline with no neural impingement.  C4-5: Osteophytes extending into the left lateral recess which could affect the C5 nerve. No disc protrusion. Moderate left facet arthritis.  C5-6: Small broad-based disc bulge with accompanying osteophytes slightly narrowing both lateral recesses. Moderate left facet arthritis.  C6-7: Small broad-based osteophytes with no neural impingement. Mild left facet arthritis.  Patient has tried meloxicam on a daily basis however she continues to have the pain in her neck. She is also noticing decreased range of motion in her neck particularly lateral rotation and extension. Numbness or tingling in her hands.   Past Medical History  Diagnosis Date  . 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 Hypertension   . Exogenous obesity   . Dyslipidemia   . Heart murmur   . Arthritis     . Osteopenia 06/2014    T score -1.1 FRAX 8.1%/0.7%  . Bone spur    Past Surgical History  Procedure Laterality Date  . Cardiac catheterization      when in 3rd grade and age 68  . Knee arthroscopy      right  . Childbirth      x 2 vaginal deliveries  . Appendectomy    . Forehead plastic surgery      MOH's surgery  . Oophorectomy      RSO  . Breast surgery    . Fracture surgery    . Cosmetic surgery    . Abdominal hysterectomy  02/07/1981    RSO   Current Outpatient Prescriptions on File Prior to Visit  Medication Sig Dispense Refill  . aspirin 81 MG tablet Take 81 mg by mouth daily.      . Cholecalciferol (VITAMIN D-3 PO) Take 2,000 Units by mouth daily. Taking 2000 daily    . Estradiol (VAGIFEM) 10 MCG TABS vaginal tablet Place 1 tablet (10 mcg total) vaginally 2 (two) times a week. 24 tablet 0  . fluticasone (FLONASE) 50 MCG/ACT nasal spray Place 2 sprays into both nostrils daily. 16 g 6  . hydrochlorothiazide (HYDRODIURIL) 25 MG tablet TAKE 1 TABLET BY MOUTH EVERY DAY 90 tablet 1  . meloxicam (MOBIC) 15 MG tablet TAKE 1 TABLET BY MOUTH EVERY DAY (Patient taking differently: TAKE 1 TABLET BY MOUTH EVERY DAY PRN) 30 tablet 3  . metoprolol tartrate (LOPRESSOR) 25 MG tablet TAKE 1 TABLET BY MOUTH EVERY DAY 90 tablet 1  . potassium chloride (  K-DUR) 10 MEQ tablet TAKE 1 TABLET BY MOUTH EVERY DAY 90 tablet 0  . Probiotic Product (PROBIOTIC DAILY PO) Take by mouth daily.    . rosuvastatin (CRESTOR) 20 MG tablet TAKE 1 TABLET BY MOUTH EVERY DAY 90 tablet 1   No current facility-administered medications on file prior to visit.   Allergies  Allergen Reactions  . Augmentin [Amoxicillin-Pot Clavulanate] Diarrhea  . Niacin-Lovastatin Er     Flushing,itching,syncope   History   Social History  . Marital Status: Married    Spouse Name: N/A  . Number of Children: N/A  . Years of Education: N/A   Occupational History  . Not on file.   Social History Main Topics  . Smoking  status: Never Smoker   . Smokeless tobacco: Never Used  . Alcohol Use: 0.6 oz/week    1 Cans of beer per week     Comment: rarely  . Drug Use: No  . Sexual Activity: Yes    Birth Control/ Protection: Post-menopausal   Other Topics Concern  . Not on file   Social History Narrative    C7-T1 and T1-2 and T2-3: The discs are normal. Moderate left facet arthritis at C7-T1.   Review of Systems  All other systems reviewed and are negative.      Objective:   Physical Exam  Cardiovascular: Normal rate and regular rhythm.   Pulmonary/Chest: Effort normal and breath sounds normal.  Abdominal: Soft. Bowel sounds are normal. She exhibits no distension and no mass. There is no tenderness. There is no rebound and no guarding.  Musculoskeletal:       Cervical back: She exhibits decreased range of motion, tenderness and pain. She exhibits no bony tenderness and no spasm.  Vitals reviewed.         Assessment & Plan:  DDD (degenerative disc disease), cervical - Plan: diclofenac (VOLTAREN) 75 MG EC tablet, Ambulatory referral to Orthopedic Surgery  LLQ abdominal pain  Cervical spondylosis without myelopathy   I believe the left lower quadrant pain is likely diverticular pain from diverticulosis. I recommended continuing the fiber supplement she is taking and adding Colace on a daily basis. Recheck in one month and if persistent proceed with a CT scan of the abdomen and pelvis. Recommended discontinuing meloxicam and replacing it with Voltaren 75 mg by mouth twice a day. I'll also refer the patient to [facility] to consider possible facet injections for her spondylosis in the cervical spine

## 2015-07-19 ENCOUNTER — Other Ambulatory Visit: Payer: Self-pay | Admitting: Gynecology

## 2015-08-05 ENCOUNTER — Other Ambulatory Visit: Payer: Self-pay | Admitting: Family Medicine

## 2015-08-05 ENCOUNTER — Encounter: Payer: Medicare PPO | Admitting: Gynecology

## 2015-08-05 ENCOUNTER — Encounter: Payer: Self-pay | Admitting: Family Medicine

## 2015-08-05 ENCOUNTER — Ambulatory Visit (INDEPENDENT_AMBULATORY_CARE_PROVIDER_SITE_OTHER): Payer: Medicare PPO | Admitting: Family Medicine

## 2015-08-05 VITALS — BP 136/80 | HR 78 | Temp 98.6°F | Resp 18 | Ht 66.0 in | Wt 194.0 lb

## 2015-08-05 DIAGNOSIS — M5431 Sciatica, right side: Secondary | ICD-10-CM | POA: Diagnosis not present

## 2015-08-05 MED ORDER — PREDNISONE 20 MG PO TABS: ORAL_TABLET | ORAL | Status: DC

## 2015-08-05 MED ORDER — OXYCODONE-ACETAMINOPHEN 5-325 MG PO TABS: 1.0000 | ORAL_TABLET | Freq: Four times a day (QID) | ORAL | Status: DC | PRN

## 2015-08-05 NOTE — Progress Notes (Signed)
Subjective:    Patient ID: Heather Hoover, female    DOB: 22-Mar-1946, 69 y.o.   MRN: [id number]  HPI  Patient presents with conflicting symptoms. She has severe pain located over the right greater trochanteric bursa area however the pain radiates down her right thigh below her right knee. She also complains of numbness and burning searing heat on the anterior portion of her thigh radiating into her groin. She awoke with the symptoms 2 days ago.  She denies any sudden injury. She does report that she was walking extensively on beach sand last week and going up and down steps carrying lots of weight. Past Medical History  Diagnosis Date  . 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 Hypertension   . Exogenous obesity   . Dyslipidemia   . Heart murmur   . Arthritis   . Osteopenia 06/2014    T score -1.1 FRAX 8.1%/0.7%  . Bone spur    Past Surgical History  Procedure Laterality Date  . Cardiac catheterization      when in 3rd grade and age 32  . Knee arthroscopy      right  . Childbirth      x 2 vaginal deliveries  . Appendectomy    . Forehead plastic surgery      MOH's surgery  . Oophorectomy      RSO  . Breast surgery    . Fracture surgery    . Cosmetic surgery    . Abdominal hysterectomy  02/07/1981    RSO   Current Outpatient Prescriptions on File Prior to Visit  Medication Sig Dispense Refill  . aspirin 81 MG tablet Take 81 mg by mouth daily.      . Cholecalciferol (VITAMIN D-3 PO) Take 2,000 Units by mouth daily. Taking 2000 daily    . diclofenac (VOLTAREN) 75 MG EC tablet TAKE 1 TABLET(75 MG) BY MOUTH TWICE DAILY 60 tablet 5  . fluticasone (FLONASE) 50 MCG/ACT nasal spray Place 2 sprays into both nostrils daily. 16 g 6  . hydrochlorothiazide (HYDRODIURIL) 25 MG tablet TAKE 1 TABLET BY MOUTH EVERY DAY 90 tablet 1  . metoprolol tartrate (LOPRESSOR) 25 MG tablet TAKE 1 TABLET BY MOUTH EVERY DAY 90 tablet 1  . potassium chloride (K-DUR) 10  MEQ tablet TAKE 1 TABLET BY MOUTH EVERY DAY 90 tablet 0  . Probiotic Product (PROBIOTIC DAILY PO) Take by mouth daily.    . rosuvastatin (CRESTOR) 20 MG tablet TAKE 1 TABLET BY MOUTH EVERY DAY 90 tablet 1  . VAGIFEM 10 MCG TABS vaginal tablet INSERT ONE TABLET IN THE VAGINA VAGINALLY TWICE WEEKLY 24 tablet 1   No current facility-administered medications on file prior to visit.   Allergies  Allergen Reactions  . Augmentin [Amoxicillin-Pot Clavulanate] Diarrhea  . Niacin-Lovastatin Er     Flushing,itching,syncope   Social History   Social History  . Marital Status: Married    Spouse Name: N/A  . Number of Children: N/A  . Years of Education: N/A   Occupational History  . Not on file.   Social History Main Topics  . Smoking status: Never Smoker   . Smokeless tobacco: Never Used  . Alcohol Use: 0.6 oz/week    1 Cans of beer per week     Comment: rarely  . Drug Use: No  . Sexual Activity: Yes    Birth Control/ Protection: Post-menopausal   Other Topics Concern  . Not on file  Social History Narrative     Review of Systems  All other systems reviewed and are negative.      Objective:   Physical Exam  Musculoskeletal: Normal range of motion. She exhibits tenderness. She exhibits no edema.       Right hip: She exhibits tenderness and bony tenderness. She exhibits normal range of motion and normal strength.  Neurological: She has normal reflexes. She displays normal reflexes. No cranial nerve deficit. She exhibits normal muscle tone. Coordination normal.          Assessment & Plan:  Sciatica associated with disorder of lumbosacral spine, right - Plan: predniSONE (DELTASONE) 20 MG tablet, oxyCODONE-acetaminophen (ROXICET) 5-325 MG per tablet  Honestly I'm not sure. Symptoms seem out of proportion to what one would expect from bursitis of the greater trochanter. Symptoms sound like possibly sciatica from possibly the L1-2, L2-3 levels.  I will treat the patient with  a prednisone taper pack and pain medication. If symptoms persist consider an MRI of the lumbar spine to evaluate for disc herniation impinging the nerve on the right-hand side. If symptoms continue to localize over the greater trochanteric bursa, I would proceed with a cortisone injection into the right hip after obtaining an x-ray recheck in one week or sooner if worse.

## 2015-08-06 ENCOUNTER — Encounter: Payer: Self-pay | Admitting: Family Medicine

## 2015-08-12 ENCOUNTER — Telehealth: Payer: Self-pay | Admitting: Family Medicine

## 2015-08-12 DIAGNOSIS — M5431 Sciatica, right side: Secondary | ICD-10-CM

## 2015-08-12 NOTE — Telephone Encounter (Signed)
Get MRI of lumbar spine

## 2015-08-12 NOTE — Telephone Encounter (Addendum)
Patient calling to say she is still having severe back pain would like to know what she should do [phone]

## 2015-08-12 NOTE — Telephone Encounter (Signed)
Call placed to patient and patient made aware.   Patient requested order for Open MRI. Orders placed.

## 2015-08-28 ENCOUNTER — Ambulatory Visit (INDEPENDENT_AMBULATORY_CARE_PROVIDER_SITE_OTHER): Payer: Medicare PPO | Admitting: Physician Assistant

## 2015-08-28 ENCOUNTER — Encounter: Payer: Self-pay | Admitting: Physician Assistant

## 2015-08-28 VITALS — BP 110/84 | HR 80 | Temp 98.5°F | Resp 16 | Wt 201.0 lb

## 2015-08-28 DIAGNOSIS — J988 Other specified respiratory disorders: Secondary | ICD-10-CM | POA: Diagnosis not present

## 2015-08-28 DIAGNOSIS — B9689 Other specified bacterial agents as the cause of diseases classified elsewhere: Principal | ICD-10-CM

## 2015-08-28 MED ORDER — HYDROCOD POLST-CPM POLST ER 10-8 MG/5ML PO SUER: 5.0000 mL | Freq: Two times a day (BID) | ORAL | Status: DC | PRN

## 2015-08-28 MED ORDER — AZITHROMYCIN 250 MG PO TABS: ORAL_TABLET | ORAL | Status: DC

## 2015-08-28 NOTE — Progress Notes (Signed)
Patient ID: Heather Hoover MRN: [id number], DOB: [birth year], 69 y.o. Date of Encounter: 08/28/2015, 12:20 PM    Chief Complaint:  Chief Complaint  Patient presents with  . Cough - clear mucous     HPI: 69 y.o. year old white female presents with above.  Says that she was around her grandchild at the end of August. Grandchild had cough. Now patient has had cough for the past 6 days. In addition to the cough she feels really tired. Says that she has had very little congestion in her head and nose and gets very little drainage from the nose. Says that in addition she is concerned because she is scheduled for an MRI of her spine Friday and right now says that she would not be able to lay still for the MRI because of cough.     Home Meds:   Outpatient Prescriptions Prior to Visit  Medication Sig Dispense Refill  . aspirin 81 MG tablet Take 81 mg by mouth daily.      . Cholecalciferol (VITAMIN D-3 PO) Take 2,000 Units by mouth daily. Taking 2000 daily    . diclofenac (VOLTAREN) 75 MG EC tablet TAKE 1 TABLET(75 MG) BY MOUTH TWICE DAILY 60 tablet 5  . fluticasone (FLONASE) 50 MCG/ACT nasal spray Place 2 sprays into both nostrils daily. 16 g 6  . hydrochlorothiazide (HYDRODIURIL) 25 MG tablet TAKE 1 TABLET BY MOUTH EVERY DAY 90 tablet 1  . metoprolol tartrate (LOPRESSOR) 25 MG tablet TAKE 1 TABLET BY MOUTH EVERY DAY 90 tablet 1  . oxyCODONE-acetaminophen (ROXICET) 5-325 MG per tablet Take 1-2 tablets by mouth every 6 (six) hours as needed for severe pain. 30 tablet 0  . potassium chloride (K-DUR) 10 MEQ tablet TAKE 1 TABLET BY MOUTH EVERY DAY 90 tablet 0  . Probiotic Product (PROBIOTIC DAILY PO) Take by mouth daily.    . rosuvastatin (CRESTOR) 20 MG tablet TAKE 1 TABLET BY MOUTH EVERY DAY 90 tablet 1  . VAGIFEM 10 MCG TABS vaginal tablet INSERT ONE TABLET IN THE VAGINA VAGINALLY TWICE WEEKLY 24 tablet 1  . predniSONE (DELTASONE) 20 MG tablet 3 tabs poqday 1-2, 2 tabs poqday 3-4, 1 tab  poqday 5-6 12 tablet 0   No facility-administered medications prior to visit.    Allergies:  Allergies  Allergen Reactions  . Augmentin [Amoxicillin-Pot Clavulanate] Diarrhea  . Niacin-Lovastatin Er     Flushing,itching,syncope      Review of Systems: See HPI for pertinent ROS. All other ROS negative.    Physical Exam: Blood pressure 110/84, pulse 80, temperature 98.5 F (36.9 C), temperature source Oral, resp. rate 16, weight 201 lb (91.173 kg)., Body mass index is 32.46 kg/(m^2). General:  WNWD WF. Appears in no acute distress. HEENT: Normocephalic, atraumatic, eyes without discharge, sclera non-icteric, nares are without discharge. Bilateral auditory canals clear, TM's are without perforation, pearly grey and translucent with reflective cone of light bilaterally. Oral cavity moist, posterior pharynx without exudate, erythema, peritonsillar abscess. No tenderness with percussion of frontal or maxillary sinuses bilaterally.  Neck: Supple. No thyromegaly. No lymphadenopathy. Lungs: Clear bilaterally to auscultation without wheezes, rales, or rhonchi. Breathing is unlabored. Heart: Regular rhythm. No murmurs, rubs, or gallops. Msk:  Strength and tone normal for age. Extremities/Skin: Warm and dry. Neuro: Alert and oriented X 3. Moves all extremities spontaneously. Gait is normal. CNII-XII grossly in tact. Psych:  Responds to questions appropriately with a normal affect.     ASSESSMENT AND PLAN:  69 y.o.  year old female with  1. Bacterial respiratory infection She is to take anti-biotic and tussionex as directed. Level of the symptoms worsen or do not resolve within 1 week after completion of anti-biotic. - azithromycin (ZITHROMAX) 250 MG tablet; Day 1: Take 2 daily.  Days 2-5: Take 1 daily.  Dispense: 6 tablet; Refill: 0 - chlorpheniramine-HYDROcodone (TUSSIONEX PENNKINETIC ER) 10-8 MG/5ML SUER; Take 5 mLs by mouth every 12 (twelve) hours as needed for cough.  Dispense: 140 mL;  Refill: 0   Signed, [address] [place], [place], [facility] 08/28/2015 12:20 PM

## 2015-08-30 ENCOUNTER — Ambulatory Visit
Admission: RE | Admit: 2015-08-30 | Discharge: 2015-08-30 | Disposition: A | Discharge status: Home / Self Care | Payer: Medicare PPO | Source: Ambulatory Visit | Attending: Family Medicine | Admitting: Family Medicine

## 2015-08-30 DIAGNOSIS — M5431 Sciatica, right side: Secondary | ICD-10-CM

## 2015-08-30 DIAGNOSIS — M5126 Other intervertebral disc displacement, lumbar region: Secondary | ICD-10-CM | POA: Diagnosis not present

## 2015-09-03 ENCOUNTER — Other Ambulatory Visit: Payer: Self-pay | Admitting: Cardiology

## 2015-09-03 ENCOUNTER — Encounter: Payer: Self-pay | Admitting: Cardiology

## 2015-09-04 ENCOUNTER — Other Ambulatory Visit: Payer: Self-pay | Admitting: Family Medicine

## 2015-09-04 ENCOUNTER — Encounter: Payer: Self-pay | Admitting: Family Medicine

## 2015-09-04 ENCOUNTER — Other Ambulatory Visit: Payer: Self-pay | Admitting: *Deleted

## 2015-09-04 DIAGNOSIS — M5136 Other intervertebral disc degeneration, lumbar region: Secondary | ICD-10-CM

## 2015-09-04 DIAGNOSIS — M5416 Radiculopathy, lumbar region: Secondary | ICD-10-CM

## 2015-09-04 DIAGNOSIS — M5126 Other intervertebral disc displacement, lumbar region: Secondary | ICD-10-CM

## 2015-09-04 MED ORDER — POTASSIUM CHLORIDE ER 10 MEQ PO TBCR: 10.0000 meq | EXTENDED_RELEASE_TABLET | Freq: Every day | ORAL | Status: DC

## 2015-09-06 ENCOUNTER — Other Ambulatory Visit: Payer: Self-pay

## 2015-09-16 DIAGNOSIS — Z803 Family history of malignant neoplasm of breast: Secondary | ICD-10-CM | POA: Diagnosis not present

## 2015-09-16 DIAGNOSIS — Z1231 Encounter for screening mammogram for malignant neoplasm of breast: Secondary | ICD-10-CM | POA: Diagnosis not present

## 2015-09-16 LAB — HM MAMMOGRAPHY: HM Mammogram: NORMAL

## 2015-09-17 ENCOUNTER — Encounter: Payer: Self-pay | Admitting: Family Medicine

## 2015-09-17 ENCOUNTER — Encounter: Payer: Self-pay | Admitting: Gynecology

## 2015-10-15 DIAGNOSIS — M5136 Other intervertebral disc degeneration, lumbar region: Secondary | ICD-10-CM | POA: Diagnosis not present

## 2015-10-17 ENCOUNTER — Other Ambulatory Visit (INDEPENDENT_AMBULATORY_CARE_PROVIDER_SITE_OTHER): Payer: Medicare PPO | Admitting: *Deleted

## 2015-10-17 DIAGNOSIS — I119 Hypertensive heart disease without heart failure: Secondary | ICD-10-CM

## 2015-10-17 DIAGNOSIS — E7800 Pure hypercholesterolemia, unspecified: Secondary | ICD-10-CM | POA: Diagnosis not present

## 2015-10-17 LAB — HEPATIC FUNCTION PANEL
ALK PHOS: 57 U/L (ref 33–130)
ALT: 33 U/L — AB (ref 6–29)
AST: 24 U/L (ref 10–35)
Albumin: 4.5 g/dL (ref 3.6–5.1)
BILIRUBIN DIRECT: 0.1 mg/dL (ref <=0.2)
BILIRUBIN INDIRECT: 0.4 mg/dL (ref 0.2–1.2)
Total Bilirubin: 0.5 mg/dL (ref 0.2–1.2)
Total Protein: 7.4 g/dL (ref 6.1–8.1)

## 2015-10-17 LAB — BASIC METABOLIC PANEL
BUN: 22 mg/dL (ref 7–25)
CALCIUM: 9.4 mg/dL (ref 8.6–10.4)
CO2: 28 mmol/L (ref 20–31)
Chloride: 100 mmol/L (ref 98–110)
Creat: 0.76 mg/dL (ref 0.50–0.99)
Glucose, Bld: 93 mg/dL (ref 65–99)
POTASSIUM: 3.6 mmol/L (ref 3.5–5.3)
SODIUM: 139 mmol/L (ref 135–146)

## 2015-10-17 LAB — LIPID PANEL
CHOLESTEROL: 176 mg/dL (ref 125–200)
HDL: 71 mg/dL (ref >=46)
LDL Cholesterol: 85 mg/dL (ref <130)
Total CHOL/HDL Ratio: 2.5 Ratio (ref <=5.0)
Triglycerides: 101 mg/dL (ref <150)
VLDL: 20 mg/dL (ref <30)

## 2015-10-17 NOTE — Progress Notes (Signed)
Quick Note:  Please make copy of labs for patient visit. ______ 

## 2015-10-17 NOTE — Addendum Note (Signed)
Addended by: Kahmari Koller K on: 10/17/2015 09:14 AM   Modules accepted: Orders  

## 2015-10-17 NOTE — Addendum Note (Signed)
Addended by: Tonita PhoenixBOWDEN, Ishmel Acevedo K on: 10/17/2015 09:14 AM   Modules accepted: Orders

## 2015-10-18 ENCOUNTER — Other Ambulatory Visit: Payer: Medicare PPO

## 2015-10-23 ENCOUNTER — Ambulatory Visit (INDEPENDENT_AMBULATORY_CARE_PROVIDER_SITE_OTHER): Payer: Medicare PPO | Admitting: Cardiology

## 2015-10-23 ENCOUNTER — Encounter: Payer: Self-pay | Admitting: Cardiology

## 2015-10-23 VITALS — BP 138/80 | HR 62 | Ht 66.0 in | Wt 200.4 lb

## 2015-10-23 DIAGNOSIS — I119 Hypertensive heart disease without heart failure: Secondary | ICD-10-CM | POA: Diagnosis not present

## 2015-10-23 DIAGNOSIS — E7800 Pure hypercholesterolemia, unspecified: Secondary | ICD-10-CM | POA: Diagnosis not present

## 2015-10-23 DIAGNOSIS — E876 Hypokalemia: Secondary | ICD-10-CM

## 2015-10-23 DIAGNOSIS — Q221 Congenital pulmonary valve stenosis: Secondary | ICD-10-CM | POA: Diagnosis not present

## 2015-10-23 NOTE — Patient Instructions (Signed)
Medication Instructions:  Your physician recommends that you continue on your current medications as directed. Please refer to the Current Medication list given to you today.  Labwork: none  Testing/Procedures: none  Follow-Up: Your physician recommends that you schedule a follow-up appointment in: 6 months with fasting labs (lp/bmet/hfp) with Lori G NP or Scott W PA   If you need a refill on your cardiac medications before your next appointment, please call your pharmacy.  

## 2015-10-23 NOTE — Progress Notes (Signed)
Cardiology Office Note   Date:  10/23/2015   ID:  Heather Hoover, Park MeoDOB 03-Nov-1946, MRN [id number]  PCP:  Leo GrosserPICKARD,WARREN TOM, MD  Cardiologist: Cassell Clementhomas Cortlan Dolin MD  Chief Complaint  Patient presents with  . Hypertension      History of Present Illness: Heather Passeyriscilla T Duskin is a 69 y.o. female who presents for a six-month follow-up visit  This pleasant 69 year old woman is seen for a scheduled followup office visit. She has a history of congenital pulmonic stenosis. She had a cardiac catheterization when she was in the third grade. [facility] was [facility]. He had a second cardiac catheterization at age 69 done in [place]. Both catheterizations showed that the pulmonic stenosis was mild. She has a history of high blood pressure and high cholesterol. Since last visit she has been feeling well. She has not been having chest pain or shortness of breath. She has been very busy helping to take care of her sister and also helping to take care of her daughter and granddaughter who lives in [place].  The patient has not been able to get to [facility] on a regular basis because of other commitments to her family. She has not been having any chest pain or shortness of breath. Recent episode of bronchitis which responded to Tussionex She has had sciatica and had an MRI showing a bulging disc and recently saw Dr. Rosalia Hammersay most who performed epidural steroid injection with improvement. Her son-in-law age 68 developed Gulliane- Teola BradleyBarr following a flu shot and is being treated with plasmapheresis.  Past Medical History  Diagnosis Date  . 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 Hypertension   . Exogenous obesity   . Dyslipidemia   . Heart murmur   . Arthritis   . Osteopenia 06/2014    T score -1.1 FRAX 8.1%/0.7%  . Bone spur     Past Surgical History  Procedure Laterality Date  . Cardiac catheterization      when in 3rd grade and age 69    . Knee arthroscopy      right  . Childbirth      x 2 vaginal deliveries  . Appendectomy    . Forehead plastic surgery      MOH's surgery  . Oophorectomy      RSO  . Breast surgery    . Fracture surgery    . Cosmetic surgery    . Abdominal hysterectomy  02/07/1981    RSO     Current Outpatient Prescriptions  Medication Sig Dispense Refill  . aspirin 81 MG tablet Take 81 mg by mouth daily.      . Cholecalciferol (VITAMIN D-3 PO) Take 2,000 Units by mouth daily. Taking 2000 daily    . diclofenac (VOLTAREN) 75 MG EC tablet TAKE 1 TABLET(75 MG) BY MOUTH TWICE DAILY 60 tablet 5  . fluticasone (FLONASE) 50 MCG/ACT nasal spray Place 2 sprays into both nostrils daily. 16 g 6  . hydrochlorothiazide (HYDRODIURIL) 25 MG tablet TAKE 1 TABLET BY MOUTH EVERY DAY 90 tablet 1  . metoprolol tartrate (LOPRESSOR) 25 MG tablet TAKE 1 TABLET BY MOUTH EVERY DAY 90 tablet 1  . potassium chloride (K-DUR) 10 MEQ tablet Take 1 tablet (10 mEq total) by mouth daily. 90 tablet 3  . Probiotic Product (PROBIOTIC DAILY PO) Take 1 tablet by mouth daily.     . rosuvastatin (CRESTOR) 20 MG tablet TAKE 1 TABLET BY MOUTH EVERY DAY  90 tablet 1  . VAGIFEM 10 MCG TABS vaginal tablet INSERT ONE TABLET IN THE VAGINA VAGINALLY TWICE WEEKLY 24 tablet 1   No current facility-administered medications for this visit.    Allergies:   Augmentin and Niacin-lovastatin er    Social History:  The patient  reports that she has never smoked. She has never used smokeless tobacco. She reports that she drinks about 0.6 oz of alcohol per week. She reports that she does not use illicit drugs.   Family History:  The patient's family history includes Breast cancer (age of onset: 93) in her sister; Breast cancer (age of onset: 46) in her mother; Cancer in her mother; Cancer (age of onset: 65) in her sister; Cancer (age of onset: 27) in her father; Diabetes in her maternal grandfather and paternal grandmother; Heart disease in her mother;  Hypertension in her mother and sister.    ROS:  Please see the history of present illness.   Otherwise, review of systems are positive for none.   All other systems are reviewed and negative.    PHYSICAL EXAM: VS:  BP 138/80 mmHg  Pulse 62  Ht  (1.676 m)  Wt 200 lb 6.4 oz (90.901 kg)  BMI 32.36 kg/m2 , BMI Body mass index is 32.36 kg/(m^2). GEN: Well nourished, well developed, in no acute distress HEENT: normal Neck: no JVD, carotid bruits, or masses Cardiac: RRR; there is a grade 2/6 systolic murmur across the pulmonic outflow tract no, rubs, or gallops,no edema  Respiratory:  clear to auscultation bilaterally, normal work of breathing GI: soft, nontender, nondistended, + BS MS: no deformity or atrophy Skin: warm and dry, no rash Neuro:  Strength and sensation are intact Psych: euthymic mood, full affect   EKG:  EKG is ordered today. The ekg ordered today demonstrates sinus rhythm.  Within normal limits.   Recent Labs: 10/17/2015: ALT 33*; BUN 22; Creat 0.76; Potassium 3.6; Sodium 139    Lipid Panel    Component Value Date/Time   CHOL 176 10/17/2015 0914   TRIG 101 10/17/2015 0914   HDL 71 10/17/2015 0914   CHOLHDL 2.5 10/17/2015 0914   VLDL 20 10/17/2015 0914   LDLCALC 85 10/17/2015 0914      Wt Readings from Last 3 Encounters:  10/23/15 200 lb 6.4 oz (90.901 kg)  08/28/15 201 lb (91.173 kg)  08/05/15 194 lb (87.998 kg)         ASSESSMENT AND PLAN:  1. Congenital pulmonic stenosis, mild 2. benign hypertensive heart disease without heart failure. Typically blood pressure at home is normal when she checks it. 3. Dyslipidemia 4. Obesity  Disposition: Continue on same medication. Recheck in 6 months for office visit  lipid panel hepatic function panel and basal metabolic panel. Encouraged her to get back into her routine of working out at [employer]. needs to lose weight.   Current medicines are reviewed at length with the patient today.  The patient  does not have concerns regarding medicines.  The following changes have been made:  no change  Labs/ tests ordered today include:   Orders Placed This Encounter  Procedures  . EKG 12-Lead      Signed, Cassell Clement MD 10/23/2015 5:13 PM    [facility] [address] [place], [place], [place]  [address] Phone: [phone]; Fax: [phone]

## 2015-10-26 ENCOUNTER — Other Ambulatory Visit: Payer: Self-pay | Admitting: Cardiology

## 2015-11-17 ENCOUNTER — Other Ambulatory Visit: Payer: Self-pay | Admitting: Cardiology

## 2015-11-18 ENCOUNTER — Encounter: Payer: Self-pay | Admitting: Gynecology

## 2015-11-18 ENCOUNTER — Other Ambulatory Visit (HOSPITAL_COMMUNITY)
Admission: RE | Admit: 2015-11-18 | Discharge: 2015-11-18 | Disposition: A | Discharge status: Home / Self Care | Payer: Medicare PPO | Source: Ambulatory Visit | Attending: Gynecology | Admitting: Gynecology

## 2015-11-18 ENCOUNTER — Ambulatory Visit (INDEPENDENT_AMBULATORY_CARE_PROVIDER_SITE_OTHER): Payer: Medicare PPO | Admitting: Gynecology

## 2015-11-18 VITALS — BP 118/78 | Ht 65.75 in | Wt 200.0 lb

## 2015-11-18 DIAGNOSIS — N952 Postmenopausal atrophic vaginitis: Secondary | ICD-10-CM

## 2015-11-18 DIAGNOSIS — N9489 Other specified conditions associated with female genital organs and menstrual cycle: Secondary | ICD-10-CM

## 2015-11-18 DIAGNOSIS — N816 Rectocele: Secondary | ICD-10-CM

## 2015-11-18 DIAGNOSIS — N8111 Cystocele, midline: Secondary | ICD-10-CM

## 2015-11-18 DIAGNOSIS — M8580 Other specified disorders of bone density and structure, unspecified site: Secondary | ICD-10-CM

## 2015-11-18 DIAGNOSIS — Z124 Encounter for screening for malignant neoplasm of cervix: Secondary | ICD-10-CM | POA: Diagnosis not present

## 2015-11-18 DIAGNOSIS — R8279 Other abnormal findings on microbiological examination of urine: Secondary | ICD-10-CM | POA: Diagnosis not present

## 2015-11-18 DIAGNOSIS — N898 Other specified noninflammatory disorders of vagina: Secondary | ICD-10-CM

## 2015-11-18 DIAGNOSIS — Z01419 Encounter for gynecological examination (general) (routine) without abnormal findings: Secondary | ICD-10-CM | POA: Diagnosis not present

## 2015-11-18 LAB — WET PREP FOR TRICH, YEAST, CLUE
Clue Cells Wet Prep HPF POC: NONE SEEN
TRICH WET PREP: NONE SEEN
WBC WET PREP: NONE SEEN
YEAST WET PREP: NONE SEEN

## 2015-11-18 MED ORDER — METRONIDAZOLE 500 MG PO TABS: 500.0000 mg | ORAL_TABLET | Freq: Two times a day (BID) | ORAL | Status: DC

## 2015-11-18 MED ORDER — ESTRADIOL 10 MCG VA TABS: ORAL_TABLET | VAGINAL | Status: DC

## 2015-11-18 NOTE — Patient Instructions (Signed)

## 2015-11-18 NOTE — Progress Notes (Signed)
Heather Hoover Apr 20, 1946 [id number]        69 y.o.  G2P2002  No LMP recorded. Patient has had a hysterectomy. for breast and pelvic exam. Several issues noted below.  Past medical history,surgical history, problem list, medications, allergies, family history and social history were all reviewed and documented as reviewed in the EPIC chart.  ROS:  Performed with pertinent positives and negatives included in the history, assessment and plan.   Additional significant findings :  none   Exam: Heather fundraiserBlanca Hoover Filed Vitals:   11/18/15 0922  BP: 118/78  Height: 5' 5.75" (1.67 m)  Weight: 200 lb (90.719 kg)   General appearance:  Normal affect, orientation and appearance. Skin: Grossly normal HEENT: Without gross lesions.  No cervical or supraclavicular adenopathy. Thyroid normal.  Lungs:  Clear without wheezing, rales or rhonchi Cardiac: RR, without RMG Abdominal:  Soft, nontender, without masses, guarding, rebound, organomegaly or hernia Breasts:  Examined lying and sitting without masses, retractions, discharge or axillary adenopathy. Pelvic:  Ext/BUS/vagina with atrophic changes. Scant discharge.  Mild cystocele/rectocele, cuff well supported  Adnexa  Without masses or tenderness    Anus and perineum  Normal   Rectovaginal  Normal sphincter tone without palpated masses or tenderness.    Assessment/Plan:  69 y.o. 702P2002 female for breast and pelvic exam..   1. Postmenopausal/atrophic genital changes. Currently using Vagifem 10 g twice weekly. Has done this for years with good relief of her vaginal dryness.  Status post TAH RSO [id number]. Wants to continue on the Vagifem. We've discussed the issues of absorption with systemic side effects such as stroke heart attack DVT. Patient accepts and refill 1 year provided. 2. Mild cystocele/rectocele. Asymptomatic. Will continue to follow with annual exams. 3. Complaints of vaginal discharge with odor. Exam is negative. Wet prep is negative.  Will cover for low-grade bacterial vaginosis with Flagyl 500 mg twice a day 7 days. Alcohol avoidance reviewed. Follow up if persists or worsens. 4. Osteopenia. DEXA 2015 T score -1.1. Stable from prior DEXA. Plan repeat over the next several years. Increased calcium vitamin D reviewed. 5. Pap smear 2012. Pap smear done today. Options to stop screening as she is over the age of 69 and status post hysterectomy for benign indications. We will readdress on an annual basis. 6. Mammography 08/2015. Continue with annual mammography when due. SBE monthly reviewed. 7. Colonoscopy 2010. Repeat at their recommended interval of 10 years. 8. Health maintenance. No routine blood work done as patient does this through her primary physician's office. Follow up in one year, sooner as needed.   Heather Hoover,Heather Maddix P MD, 10:05 AM 11/18/2015

## 2015-11-19 LAB — URINALYSIS W MICROSCOPIC + REFLEX CULTURE
Bilirubin Urine: NEGATIVE
CASTS: NONE SEEN [LPF]
Crystals: NONE SEEN [HPF]
Glucose, UA: NEGATIVE
Hgb urine dipstick: NEGATIVE
Ketones, ur: NEGATIVE
LEUKOCYTES UA: NEGATIVE
NITRITE: NEGATIVE
PH: 5.5 (ref 5.0–8.0)
Protein, ur: NEGATIVE
RBC / HPF: NONE SEEN RBC/HPF (ref <=2)
SPECIFIC GRAVITY, URINE: 1.019 (ref 1.001–1.035)
YEAST: NONE SEEN [HPF]

## 2015-11-20 LAB — URINE CULTURE
COLONY COUNT: NO GROWTH
ORGANISM ID, BACTERIA: NO GROWTH

## 2015-11-20 LAB — CYTOLOGY - PAP

## 2015-11-29 ENCOUNTER — Other Ambulatory Visit: Payer: Self-pay | Admitting: Cardiology

## 2015-12-03 ENCOUNTER — Telehealth: Payer: Self-pay | Admitting: Cardiology

## 2015-12-03 NOTE — Telephone Encounter (Signed)
New message     *STAT* If patient is at the pharmacy, call can be transferred to refill team.   1. Which medications need to be refilled? (please list name of each medication and dose if known) k+ 10 meq  2. Which pharmacy/location (including street and city if local pharmacy) is medication to be sent to? [facility]  [phone]   3. Do they need a 30 day or 90 day supply?  90 days supply

## 2015-12-04 ENCOUNTER — Other Ambulatory Visit: Payer: Self-pay | Admitting: *Deleted

## 2015-12-04 MED ORDER — POTASSIUM CHLORIDE ER 10 MEQ PO TBCR: 10.0000 meq | EXTENDED_RELEASE_TABLET | Freq: Every day | ORAL | Status: DC

## 2015-12-04 NOTE — Telephone Encounter (Signed)
New message       *STAT* If patient is at the pharmacy, call can be transferred to refill team.   1. Which medications need to be refilled? (please list name of each medication and dose if known) potassium chloride 2. Which pharmacy/location (including street and city if local pharmacy) is medication to be sent to? [facility] 3. Do they need a 30 day or 90 day supply? 90 day

## 2016-02-26 ENCOUNTER — Other Ambulatory Visit: Payer: Self-pay | Admitting: Family Medicine

## 2016-02-26 NOTE — Telephone Encounter (Signed)
Medication refilled per protocol. 

## 2016-03-12 ENCOUNTER — Ambulatory Visit (INDEPENDENT_AMBULATORY_CARE_PROVIDER_SITE_OTHER): Payer: Medicare Other | Admitting: Family Medicine

## 2016-03-12 ENCOUNTER — Encounter: Payer: Self-pay | Admitting: Family Medicine

## 2016-03-12 VITALS — BP 142/100 | HR 72 | Temp 98.3°F | Resp 18 | Ht 66.0 in | Wt 203.0 lb

## 2016-03-12 DIAGNOSIS — Z0000 Encounter for general adult medical examination without abnormal findings: Secondary | ICD-10-CM

## 2016-03-12 DIAGNOSIS — R5383 Other fatigue: Secondary | ICD-10-CM

## 2016-03-12 LAB — CBC WITH DIFFERENTIAL/PLATELET
Basophils Absolute: 0 10*3/uL (ref 0.0–0.1)
Basophils Relative: 0 % (ref 0–1)
EOS ABS: 0.1 10*3/uL (ref 0.0–0.7)
Eosinophils Relative: 2 % (ref 0–5)
HCT: 41.4 % (ref 36.0–46.0)
HEMOGLOBIN: 14 g/dL (ref 12.0–15.0)
LYMPHS ABS: 1.5 10*3/uL (ref 0.7–4.0)
Lymphocytes Relative: 28 % (ref 12–46)
MCH: 30.6 pg (ref 26.0–34.0)
MCHC: 33.8 g/dL (ref 30.0–36.0)
MCV: 90.4 fL (ref 78.0–100.0)
MONOS PCT: 9 % (ref 3–12)
MPV: 9.6 fL (ref 8.6–12.4)
Monocytes Absolute: 0.5 10*3/uL (ref 0.1–1.0)
NEUTROS ABS: 3.2 10*3/uL (ref 1.7–7.7)
NEUTROS PCT: 61 % (ref 43–77)
PLATELETS: 218 10*3/uL (ref 150–400)
RBC: 4.58 MIL/uL (ref 3.87–5.11)
RDW: 13.3 % (ref 11.5–15.5)
WBC: 5.2 10*3/uL (ref 4.0–10.5)

## 2016-03-12 LAB — TSH: TSH: 2.51 mIU/L

## 2016-03-12 NOTE — Progress Notes (Signed)
Subjective:    Patient ID: Heather Hoover, female    DOB: [birth year], 70 y.o.   MRN: [id number]  HPI Patient is here today for complete physical exam. She is using diclofenac twice a day for the degenerative disc disease.  This helps her pain significantly. However we have discussed at length the gastrointestinal risk and she would like to do something the mitigate her risk somewhat. She is currently not taking any kind of proton pump inhibitor or heartburn medication. Last mammogram was in September 2016 and was normal. Colonoscopy was in 2010 and is not due again until 2020. Patient's Pap smears performed at her gynecologist and is up-to-date. Her bone density is also performed at her gynecologist. It was last checked 2 years ago and she was found to have low bone mass. She is due to recheck that again this year. The patient had a CMP and a fasting lipid panel in October that was excellent. She is due for a CBC. She also reports fatigue like to check a TSH. Past Medical History  Diagnosis Date  . 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 Hypertension   . Exogenous obesity   . Dyslipidemia   . Heart murmur   . Arthritis   . Osteopenia 06/2014    T score -1.1 FRAX 8.1%/0.7%  . Bone spur    Past Surgical History  Procedure Laterality Date  . Cardiac catheterization      when in 3rd grade and age 52  . Knee arthroscopy      right  . Childbirth      x 2 vaginal deliveries  . Appendectomy    . Forehead plastic surgery      MOH's surgery  . Oophorectomy      RSO  . Breast surgery    . Fracture surgery    . Cosmetic surgery    . Abdominal hysterectomy  02/07/1981    RSO   Current Outpatient Prescriptions on File Prior to Visit  Medication Sig Dispense Refill  . aspirin 81 MG tablet Take 81 mg by mouth daily.      . Cholecalciferol (VITAMIN D-3 PO) Take 2,000 Units by mouth daily. Taking 2000 daily    . diclofenac (VOLTAREN) 75 MG EC tablet TAKE 1  TABLET(75 MG) BY MOUTH TWICE DAILY 60 tablet 0  . Estradiol (VAGIFEM) 10 MCG TABS vaginal tablet INSERT ONE TABLET IN THE VAGINA VAGINALLY TWICE WEEKLY 24 tablet 4  . fluticasone (FLONASE) 50 MCG/ACT nasal spray Place 2 sprays into both nostrils daily. 16 g 6  . hydrochlorothiazide (HYDRODIURIL) 25 MG tablet TAKE 1 TABLET BY MOUTH EVERY DAY 90 tablet 3  . metoprolol tartrate (LOPRESSOR) 25 MG tablet TAKE 1 TABLET BY MOUTH EVERY DAY 90 tablet 3  . metroNIDAZOLE (FLAGYL) 500 MG tablet Take 1 tablet (500 mg total) by mouth 2 (two) times daily. 14 tablet 0  . potassium chloride (K-DUR) 10 MEQ tablet Take 1 tablet (10 mEq total) by mouth daily. 90 tablet 3  . Probiotic Product (PROBIOTIC DAILY PO) Take 1 tablet by mouth daily.     . rosuvastatin (CRESTOR) 20 MG tablet TAKE 1 TABLET BY MOUTH EVERY DAY 90 tablet 3   No current facility-administered medications on file prior to visit.   Allergies  Allergen Reactions  . Augmentin [Amoxicillin-Pot Clavulanate] Diarrhea  . Niacin-Lovastatin Er     Flushing,itching,syncope   Social History   Social History  . Marital Status: Married  Spouse Name: N/A  . Number of Children: N/A  . Years of Education: N/A   Occupational History  . Not on file.   Social History Main Topics  . Smoking status: Never Smoker   . Smokeless tobacco: Never Used  . Alcohol Use: 0.6 oz/week    1 Cans of beer per week     Comment: rarely  . Drug Use: No  . Sexual Activity: Yes    Birth Control/ Protection: Post-menopausal   Other Topics Concern  . Not on file   Social History Narrative   Family History  Problem Relation Age of Onset  . Hypertension Mother   . Breast cancer Mother 1580    Breast   . Cancer Mother     uterine thye think  . Heart disease Mother   . Cancer Father [id number]    prostate metastized to bone  . Hypertension Sister   . Breast cancer Sister 5965  . Cancer Sister 4455    uterine  . Diabetes Maternal Grandfather   . Diabetes Paternal  Grandmother       Review of Systems  All other systems reviewed and are negative.      Objective:   Physical Exam  Constitutional: She is oriented to person, place, and time. She appears well-developed and well-nourished. No distress.  HENT:  Head: Normocephalic and atraumatic.  Right Ear: External ear normal.  Left Ear: External ear normal.  Nose: Nose normal.  Mouth/Throat: Oropharynx is clear and moist. No oropharyngeal exudate.  Eyes: Conjunctivae and EOM are normal. Pupils are equal, round, and reactive to light. Right eye exhibits no discharge. Left eye exhibits no discharge. No scleral icterus.  Neck: Normal range of motion. Neck supple. No JVD present. No tracheal deviation present. No thyromegaly present.  Cardiovascular: Normal rate, regular rhythm, normal heart sounds and intact distal pulses.  Exam reveals no gallop and no friction rub.   No murmur heard. Pulmonary/Chest: Effort normal and breath sounds normal. No stridor. No respiratory distress. She has no wheezes. She has no rales. She exhibits no tenderness.  Abdominal: Soft. Bowel sounds are normal. She exhibits no distension and no mass. There is no tenderness. There is no rebound and no guarding.  Musculoskeletal: Normal range of motion. She exhibits no edema or tenderness.  Lymphadenopathy:    She has no cervical adenopathy.  Neurological: She is alert and oriented to person, place, and time. She has normal reflexes. She displays normal reflexes. No cranial nerve deficit. She exhibits normal muscle tone. Coordination normal.  Skin: Skin is warm. No rash noted. She is not diaphoretic. No erythema. No pallor.  Psychiatric: She has a normal mood and affect. Her behavior is normal. Judgment and thought content normal.  Vitals reviewed.         Assessment & Plan:  Other fatigue - Plan: CBC with Differential/Platelet, TSH  Routine general medical examination at a health care facility   Patient's physical exam  today is normal except for her elevated blood pressure. Given her fatigue I will check a CBC as well as a TSH. If lab work is normal, I will recommend switching hydrochlorothiazide to Hyzaar 50/25 one by mouth daily. I also recommended that she start taking Zantac if she's not be taking diclofenac on a daily basis. Patient is due for Pneumovax 23 but she defers that at the present time. Otherwise her cancer screening and immunizations are up-to-date. Regular anticipatory guidance is provided.

## 2016-03-21 ENCOUNTER — Other Ambulatory Visit: Payer: Self-pay | Admitting: Family Medicine

## 2016-04-21 ENCOUNTER — Other Ambulatory Visit: Payer: Self-pay

## 2016-04-21 ENCOUNTER — Ambulatory Visit (INDEPENDENT_AMBULATORY_CARE_PROVIDER_SITE_OTHER): Payer: Medicare Other | Admitting: Nurse Practitioner

## 2016-04-21 ENCOUNTER — Other Ambulatory Visit: Payer: Medicare Other

## 2016-04-21 ENCOUNTER — Encounter: Payer: Self-pay | Admitting: Nurse Practitioner

## 2016-04-21 VITALS — BP 130/72 | HR 80 | Ht 66.0 in | Wt 202.8 lb

## 2016-04-21 DIAGNOSIS — I119 Hypertensive heart disease without heart failure: Secondary | ICD-10-CM

## 2016-04-21 DIAGNOSIS — E7800 Pure hypercholesterolemia, unspecified: Secondary | ICD-10-CM

## 2016-04-21 DIAGNOSIS — Q221 Congenital pulmonary valve stenosis: Secondary | ICD-10-CM

## 2016-04-21 LAB — BASIC METABOLIC PANEL
BUN: 13 mg/dL (ref 7–25)
CO2: 28 mmol/L (ref 20–31)
Calcium: 9.2 mg/dL (ref 8.6–10.4)
Chloride: 101 mmol/L (ref 98–110)
Creat: 0.74 mg/dL (ref 0.50–0.99)
Glucose, Bld: 101 mg/dL — ABNORMAL HIGH (ref 65–99)
Potassium: 4 mmol/L (ref 3.5–5.3)
Sodium: 138 mmol/L (ref 135–146)

## 2016-04-21 LAB — LIPID PANEL
Cholesterol: 167 mg/dL (ref 125–200)
HDL: 62 mg/dL (ref >=46)
LDL Cholesterol: 77 mg/dL (ref <130)
Total CHOL/HDL Ratio: 2.7 Ratio (ref <=5.0)
Triglycerides: 138 mg/dL (ref <150)
VLDL: 28 mg/dL (ref <30)

## 2016-04-21 LAB — HEPATIC FUNCTION PANEL
ALT: 31 U/L — ABNORMAL HIGH (ref 6–29)
AST: 29 U/L (ref 10–35)
Albumin: 4 g/dL (ref 3.6–5.1)
Alkaline Phosphatase: 55 U/L (ref 33–130)
Bilirubin, Direct: 0.1 mg/dL (ref <=0.2)
Indirect Bilirubin: 0.4 mg/dL (ref 0.2–1.2)
Total Bilirubin: 0.5 mg/dL (ref 0.2–1.2)
Total Protein: 6.6 g/dL (ref 6.1–8.1)

## 2016-04-21 MED ORDER — POTASSIUM CHLORIDE ER 10 MEQ PO TBCR: 10.0000 meq | EXTENDED_RELEASE_TABLET | Freq: Every day | ORAL | Status: DC

## 2016-04-21 MED ORDER — ROSUVASTATIN CALCIUM 20 MG PO TABS: 20.0000 mg | ORAL_TABLET | Freq: Every day | ORAL | Status: DC

## 2016-04-21 MED ORDER — HYDROCHLOROTHIAZIDE 25 MG PO TABS: 25.0000 mg | ORAL_TABLET | Freq: Every day | ORAL | Status: DC

## 2016-04-21 MED ORDER — METOPROLOL TARTRATE 25 MG PO TABS: 25.0000 mg | ORAL_TABLET | Freq: Every day | ORAL | Status: DC

## 2016-04-21 NOTE — Progress Notes (Signed)
CARDIOLOGY OFFICE NOTE  Date:  04/21/2016    Heather Hoover Date of Birth: 1946-04-18 Medical Record #[id number]  PCP:  Heather Grosser, MD  Cardiologist:  Former patient of Dr. Yevonne Pax  - to establish with Dr. Duke Salvia  Chief Complaint  Patient presents with  . Cardiac Valve Problem    Follow up visit - former patient of Dr. Yevonne Pax    History of Present Illness: Heather Hoover is a 70 y.o. female who presents today for a follow up visit. Former patient of Dr. Yevonne Pax.   She has a history of congenital pulmonic stenosis. She had a cardiac catheter[facility] when [facility] was in the third [facility]. She had a second cardiac catheterization at age 3 done in [place]. Both catheterizations showed that the pulmonic stenosis was mild. Last echo dates back to 2011. Other issues include HTN, HLD and obesity.   Last seen 6 months ago and was felt to be doing ok. Encouraged to work on her weight.   Comes back today. Here alone. Her sister died this week - she was also a patient of Dr. Yevonne Pax - had CHF. She feels fatigued and "heavy". Gets short of breath with exertion. Feels like this is worse.  Weight is up a few pounds. Has not gotten back to her exercise habit yet. Not dizzy or lightheaded. Tolerating her medicines. Occasional swelling - typically goes down overnight. No chest pain.   Past Medical History  Diagnosis Date  . 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 Hypertension   . Exogenous obesity   . Dyslipidemia   . Heart murmur   . Arthritis   . Osteopenia 06/2014    T score -1.1 FRAX 8.1%/0.7%  . Bone spur     Past Surgical History  Procedure Laterality Date  . Cardiac catheterization      when in 3rd grade and age 58  . Knee arthroscopy      right  . Childbirth      x 2 vaginal deliveries  . Appendectomy    . Forehead plastic surgery      MOH's surgery  . Oophorectomy      RSO   . Breast surgery    . Fracture surgery    . Cosmetic surgery    . Abdominal hysterectomy  02/07/1981    RSO     Medications: Current Outpatient Prescriptions  Medication Sig Dispense Refill  . aspirin 81 MG tablet Take 81 mg by mouth daily.      . calcium carbonate (TUMS - DOSED IN MG ELEMENTAL CALCIUM) 500 MG chewable tablet Chew 1 tablet by mouth daily.    . Cholecalciferol (VITAMIN D-3 PO) Take 2,000 Units by mouth daily. Taking 2000 daily    . diclofenac (VOLTAREN) 75 MG EC tablet TAKE 1 TABLET(75 MG) BY MOUTH TWICE DAILY 60 tablet 11  . Estradiol (VAGIFEM) 10 MCG TABS vaginal tablet INSERT ONE TABLET IN THE VAGINA VAGINALLY TWICE WEEKLY 24 tablet 4  . fluticasone (FLONASE) 50 MCG/ACT nasal spray USE 2 SPRAYS IN BOTH NOSTRILS EVERY DAY 16 g 11  . hydrochlorothiazide (HYDRODIURIL) 25 MG tablet Take 1 tablet (25 mg total) by mouth daily. 90 tablet 3  . metoprolol tartrate (LOPRESSOR) 25 MG tablet Take 1 tablet (25 mg total) by mouth daily. 90 tablet 3  . potassium chloride (K-DUR) 10 MEQ tablet Take 1 tablet (10 mEq total) by mouth daily. 90 tablet 3  .  Probiotic Product (PROBIOTIC DAILY PO) Take 1 tablet by mouth daily.     . rosuvastatin (CRESTOR) 20 MG tablet Take 1 tablet (20 mg total) by mouth daily. 90 tablet 3   No current facility-administered medications for this visit.    Allergies: Allergies  Allergen Reactions  . Augmentin [Amoxicillin-Pot Clavulanate] Diarrhea  . Niacin-Lovastatin Er     Flushing,itching,syncope    Social History: The patient  reports that she has never smoked. She has never used smokeless tobacco. She reports that she drinks about 0.6 oz of alcohol per week. She reports that she does not use illicit drugs.   Family History: The patient's family history includes Breast cancer (age of onset: 57) in her sister; Breast cancer (age of onset: 53) in her mother; Cancer in her mother; Cancer (age of onset: 75) in her sister; Cancer (age of onset: 40)  in her father; Diabetes in her maternal grandfather and paternal grandmother; Heart disease in her mother; Hypertension in her mother and sister.   Review of Systems: Please see the history of present illness.   Otherwise, the review of systems is positive for none.   All other systems are reviewed and negative.   Physical Exam: VS:  BP 130/72 mmHg  Pulse 80  Ht  (1.676 m)  Wt 202 lb 12.8 oz (91.989 kg)  BMI 32.75 kg/m2 .  BMI Body mass index is 32.75 kg/(m^2).  Wt Readings from Last 3 Encounters:  04/21/16 202 lb 12.8 oz (91.989 kg)  03/12/16 203 lb (92.08 kg)  11/18/15 200 lb (90.719 kg)    General: Pleasant. Obese female who is alert and in no acute distress.  HEENT: Normal. Neck: Supple, no JVD, carotid bruits, or masses noted.  Cardiac: Regular rate and rhythm. Grade 2/6 murmur noted. No edema.  Respiratory:  Lungs are clear to auscultation bilaterally with normal work of breathing.  GI: Soft and nontender.  MS: No deformity or atrophy. Gait and ROM intact. Skin: Warm and dry. Color is normal.  Neuro:  Strength and sensation are intact and no gross focal deficits noted.  Psych: Alert, appropriate and with normal affect.   LABORATORY DATA:  EKG:  EKG is not ordered today.  Lab Results  Component Value Date   WBC 5.2 03/12/2016   HGB 14.0 03/12/2016   HCT 41.4 03/12/2016   PLT 218 03/12/2016   GLUCOSE 93 10/17/2015   CHOL 176 10/17/2015   TRIG 101 10/17/2015   HDL 71 10/17/2015   LDLCALC 85 10/17/2015   ALT 33* 10/17/2015   AST 24 10/17/2015   NA 139 10/17/2015   K 3.6 10/17/2015   CL 100 10/17/2015   CREATININE 0.76 10/17/2015   BUN 22 10/17/2015   CO2 28 10/17/2015   TSH 2.51 03/12/2016    BNP (last 3 results) No results for input(s): BNP in the last 8760 hours.  ProBNP (last 3 results) No results for input(s): PROBNP in the last 8760 hours.   Other Studies Reviewed Today:  Echo Study Conclusions from 2011  - Left ventricle: The estimated  ejection fraction was 60%. Wall  motion was normal; there were no regional wall motion  abnormalities. Left ventricular diastolic function parameters were  normal. - Aortic valve: Trivial regurgitation.  Assessment/Plan: 1. Congenital pulmonic stenosis - needs echo updated - now with more DOE/fatigue. Tentatively see back in 6 months unless echo warrants otherwise.   2.  Benign hypertensive heart disease without heart failure. BP ok on her current regimen.  Meds refilled today.   3. Dyslipidemia - lab today  4. Obesity - I suspect she is deconditioned. Would like for her to get back to her exercise routine.   Current medicines are reviewed with the patient today.  The patient does not have concerns regarding medicines other than what has been noted above.  The following changes have been made:  See above.  Labs/ tests ordered today include:    Orders Placed This Encounter  Procedures  . Basic metabolic panel  . Hepatic function panel  . Lipid panel     Disposition:   FU with Dr. Duke Salviaandolph in 6 months.   Patient is agreeable to this plan and will call if any problems develop in the interim.   Signed: Rosalio MacadamiaLori C. Nochum Fenter, RN, ANP-C 04/21/2016 9:01 AM  [name] [address] [place], [place]  [address] Phone: [phone] Fax: [phone]

## 2016-04-21 NOTE — Patient Instructions (Addendum)
We will be checking the following labs today - BMET, HPF, Lipids   Medication Instructions:    Continue with your current medicines.   I sent in your refilled today    Testing/Procedures To Be Arranged:  Echocardiogram  Follow-Up:   See Dr. Duke Salviaandolph in 6 months.     Other Special Inst[facility]    If you need a refill on your cardiac medications before your next appointment, please call your pharmacy.   Call the [facility] office at [phone] if you have any questions, problems or concerns.

## 2016-05-13 ENCOUNTER — Telehealth: Payer: Self-pay | Admitting: Family Medicine

## 2016-05-13 NOTE — Telephone Encounter (Signed)
Spoke to pt and she states that she is having severe pain in her back. She just had a ESI on 5/5/ with Dr. Ethelene Halamos. She also states that she has not seen an ortho there other then them giving her the injections. She has never had a consultation with any of the MD's there. She just goes in and gets the shot and leaves. She is wondering if she needs to come see you or does she need to see the orhto for consultation as the injections are no longer working???

## 2016-05-13 NOTE — Telephone Encounter (Signed)
Patient calling to discuss ongoing issues with sciatica  [phone]

## 2016-05-14 NOTE — Telephone Encounter (Signed)
Pt aware and will call to set up appt

## 2016-05-14 NOTE — Telephone Encounter (Signed)
I recommend consultation wit Dr. Shon BatonBrooks there.

## 2016-05-19 ENCOUNTER — Other Ambulatory Visit: Payer: Self-pay

## 2016-05-19 ENCOUNTER — Ambulatory Visit (HOSPITAL_COMMUNITY): Payer: Medicare Other | Attending: Cardiovascular Disease

## 2016-05-19 DIAGNOSIS — I119 Hypertensive heart disease without heart failure: Secondary | ICD-10-CM | POA: Insufficient documentation

## 2016-05-19 DIAGNOSIS — I351 Nonrheumatic aortic (valve) insufficiency: Secondary | ICD-10-CM | POA: Diagnosis not present

## 2016-05-19 DIAGNOSIS — I340 Nonrheumatic mitral (valve) insufficiency: Secondary | ICD-10-CM | POA: Insufficient documentation

## 2016-05-19 DIAGNOSIS — E785 Hyperlipidemia, unspecified: Secondary | ICD-10-CM | POA: Diagnosis not present

## 2016-05-19 DIAGNOSIS — Z8249 Family history of ischemic heart disease and other diseases of the circulatory system: Secondary | ICD-10-CM | POA: Insufficient documentation

## 2016-05-19 DIAGNOSIS — I370 Nonrheumatic pulmonary valve stenosis: Secondary | ICD-10-CM | POA: Diagnosis not present

## 2016-05-19 DIAGNOSIS — Q221 Congenital pulmonary valve stenosis: Secondary | ICD-10-CM | POA: Insufficient documentation

## 2016-05-23 ENCOUNTER — Encounter: Payer: Self-pay | Admitting: Nurse Practitioner

## 2016-09-30 LAB — HM MAMMOGRAPHY

## 2016-10-05 ENCOUNTER — Encounter: Payer: Self-pay | Admitting: Gynecology

## 2016-10-07 ENCOUNTER — Encounter: Payer: Self-pay | Admitting: Family Medicine

## 2016-10-26 ENCOUNTER — Ambulatory Visit (INDEPENDENT_AMBULATORY_CARE_PROVIDER_SITE_OTHER): Payer: Medicare Other | Admitting: Cardiovascular Disease

## 2016-10-26 ENCOUNTER — Encounter: Payer: Self-pay | Admitting: Cardiovascular Disease

## 2016-10-26 VITALS — BP 173/84 | HR 77 | Ht 66.0 in | Wt 208.4 lb

## 2016-10-26 DIAGNOSIS — I370 Nonrheumatic pulmonary valve stenosis: Secondary | ICD-10-CM

## 2016-10-26 DIAGNOSIS — R0602 Shortness of breath: Secondary | ICD-10-CM | POA: Diagnosis not present

## 2016-10-26 DIAGNOSIS — I119 Hypertensive heart disease without heart failure: Secondary | ICD-10-CM | POA: Diagnosis not present

## 2016-10-26 DIAGNOSIS — R5383 Other fatigue: Secondary | ICD-10-CM

## 2016-10-26 MED ORDER — CARVEDILOL 12.5 MG PO TABS: 12.5000 mg | ORAL_TABLET | Freq: Two times a day (BID) | ORAL | 5 refills | Status: DC

## 2016-10-26 NOTE — Progress Notes (Signed)
Cardiology Office Note   Date:  10/26/2016   ID:  Heather Hoover, DOB Mar 20, 1946, MRN [id number]  PCP:  Leo Grosser, MD  Cardiologist:   Chilton Si, MD   No chief complaint on file.    History of Present Illness: Heather Hoover is a 70 y.o. female with a history of hypertension, hyperlipidemia, and congenital pulmonic stenosis who presents for follow up.  Heather Hoover was previously a patient of Dr. [name].  She had heart catheterizations at ages 69 and 82.  Each time she reportedly had mild pulmonic stenosis.  She saw Heather Hoover 66/2017 and reported chest heaviness after the death of her sister.  She also reported increased shortness of breath.  She had an echo 05/19/16 revealed LVEF 55-60% with mild mitral regurgitation, mild aortic regurgitation and mild pulmonary stenosis. The peak gradient is 20 mmHg.  Since her last appointment Heather Hoover has been doing well. Her only complaint is exertional dyspnea. This is been stable and not changing lately. She suspects that most of her symptoms are due to physical and activity and weight gain. She struggles a lot with hip and back pain. This limits her ability to exercise. She continues to go to the [facility] 3 times per week but notes that she does not exercise very vigorously.  She does sometimes have mild lower extremity edema that improves with elevation. She denies orthopnea or PND.   Past Medical History:  Diagnosis Date  . Arthritis   . Bone spur   . Dyslipidemia   . Exogenous obesity   . Heart murmur   . Hypertension   . 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 Osteopenia 06/2014   T score -1.1 FRAX 8.1%/0.7%    Past Surgical History:  Procedure Laterality Date  . ABDOMINAL HYSTERECTOMY  02/07/1981   RSO  . APPENDECTOMY    . BREAST SURGERY    . CARDIAC CATHETERIZATION     when in 3rd grade and age 62  . childbirth     x 2 vaginal deliveries  . COSMETIC SURGERY    .  forehead plastic surgery     MOH's surgery  . FRACTURE SURGERY    . KNEE ARTHROSCOPY     right  . OOPHORECTOMY     RSO     Current Outpatient Prescriptions  Medication Sig Dispense Refill  . Cholecalciferol (VITAMIN D-3 PO) Take 2,000 Units by mouth daily. Taking 2000 daily    . ciclopirox (LOPROX) 0.77 % cream Apply topically daily.    . diclofenac (VOLTAREN) 75 MG EC tablet TAKE 1 TABLET(75 MG) BY MOUTH TWICE DAILY 60 tablet 11  . Estradiol (VAGIFEM) 10 MCG TABS vaginal tablet INSERT ONE TABLET IN THE VAGINA VAGINALLY TWICE WEEKLY 24 tablet 4  . fluticasone (FLONASE) 50 MCG/ACT nasal spray USE 2 SPRAYS IN BOTH NOSTRILS EVERY DAY 16 g 11  . hydrochlorothiazide (HYDRODIURIL) 25 MG tablet Take 1 tablet (25 mg total) by mouth daily. 90 tablet 3  . potassium chloride (K-DUR) 10 MEQ tablet Take 1 tablet (10 mEq total) by mouth daily. 90 tablet 3  . ranitidine (ZANTAC) 75 MG tablet Take 75 mg by mouth daily.    . rosuvastatin (CRESTOR) 20 MG tablet Take 1 tablet (20 mg total) by mouth daily. 90 tablet 3  . carvedilol (COREG) 12.5 MG tablet Take 1 tablet (12.5 mg total) by mouth 2 (two) times daily. 60 tablet 5   No current facility-administered medications  for this visit.     Allergies:   Augmentin [amoxicillin-pot clavulanate] and Niacin-lovastatin er    Social History:  The patient  reports that she has never smoked. She has never used smokeless tobacco. She reports that she drinks about 0.6 oz of alcohol per week . She reports that she does not use drugs.   Family History:  The patient's family history includes Arthritis in her father; Breast cancer (age of onset: 3565) in her sister; Breast cancer (age of onset: 3780) in her mother; Cancer in her mother; Cancer (age of onset: 9655) in her sister; Cancer (age of onset: 2478) in her father; Diabetes in her maternal grandfather and paternal grandmother; Heart failure in her sister; Hypertension in her father, mother, and sister; Muscular dystrophy  in her brother.    ROS:  Please see the history of present illness.   Otherwise, review of systems are positive for none.   All other systems are reviewed and negative.    PHYSICAL EXAM: VS:  BP (!) 173/84   Pulse 77   Ht 5\' 6"  (1.676 m)   Wt 94.5 kg (208 lb 6.4 oz)   BMI 33.64 kg/m  , BMI Body mass index is 33.64 kg/m. GENERAL:  Well appearing HEENT:  Pupils equal round and reactive, fundi not visualized, oral mucosa unremarkable NECK:  No jugular venous distention, waveform within normal limits, carotid upstroke brisk and symmetric, no bruits, no thyromegaly LYMPHATICS:  No cervical adenopathy LUNGS:  Clear to auscultation bilaterally HEART:  RRR.  PMI not displaced or sustained S1.  Split S2 within normal limits, no S3, no S4, no clicks, no rubs, II/VI early-peaking systolic murmur at the LUSB.  ABD:  Flat, positive bowel sounds normal in frequency in pitch, no bruits, no rebound, no guarding, no midline pulsatile mass, no hepatomegaly, no splenomegaly EXT:  2 plus pulses throughout, no edema, no cyanosis no clubbing SKIN:  No rashes no nodules NEURO:  Cranial nerves II through XII grossly intact, motor grossly intact throughout PSYCH:  Cognitively intact, oriented to person place and time    EKG:  EKG is ordered today. The ekg ordered today demonstrates sinus rhythm rate 77 bpm.   Recent Labs: 03/12/2016: Hemoglobin 14.0; Platelets 218; TSH 2.51 04/21/2016: ALT 31; BUN 13; Creat 0.74; Potassium 4.0; Sodium 138    Lipid Panel    Component Value Date/Time   CHOL 167 04/21/2016 0912   TRIG 138 04/21/2016 0912   HDL 62 04/21/2016 0912   CHOLHDL 2.7 04/21/2016 0912   VLDL 28 04/21/2016 0912   LDLCALC 77 04/21/2016 0912      Wt Readings from Last 3 Encounters:  10/26/16 94.5 kg (208 lb 6.4 oz)  04/21/16 92 kg (202 lb 12.8 oz)  03/12/16 92.1 kg (203 lb)      ASSESSMENT AND PLAN:  # Hypertension:  Blood pressure is poorly-controlled. We will stop metoprolol and  start carvedilol 12.5 mg twice daily. Continue hydrochlorothiazide.   # Shortness of breath: # Fatigue: # Obesity: Heather Hoover' symptoms seem to be mostly attributable to this clinic today and weight gain. Her echo did not show any heart failure or worsening of her pulmonic stenosis. We will check a basic metabolic panel and a CBC. She has no chest pain or pressure and the symptoms have been unchanged for a long period of time. Therefore, it is unlikely that this represents ischemia. I recommended that she increase her exercise regimen to at least 30-40 minutes most days of the  week. I also recommended that she work on losing some weight. She has had success with weight watcher's and past and would like to try this again.  # Mild pulmonic stenosis:  Stable on echo 05/19/16 revealed a peak gradient of 20 mmHg.    Current medicines are reviewed at length with the patient today.  The patient does not have concerns regarding medicines.  The following changes have been made:  Switch metoprolol to carvedilol  Labs/ tests ordered today include:   Orders Placed This Encounter  Procedures  . CBC with Differential/Platelet  . Basic metabolic panel  . EKG 12-Lead   Time spent: 45 minutes-Greater than 50% of this time was spent in counseling, explanation of diagnosis, planning of further management, and coordination of care.   Disposition:   FU with Heather Deignan C. Duke Salviaandolph, MD, [facility] in 4 months.    This note was written with the assistance of speech recognition software.  Please excuse any transcriptional errors.  Signed, Mayleen Borrero C. Duke Salviaandolph, MD, [facility]  10/26/2016 5:50 PM    [facility]

## 2016-10-26 NOTE — Patient Instructions (Addendum)
Medication Instructions:  STOP METOPROLOL  START CARVEDILOL 12.5 MG TWICE A DAY   Labwork: BMET/CBC AT [facility] ON THE FIRST FLOOR   Testing/Procedures: none  Follow-Up: Your physician recommends that you schedule a follow-up appointment in: 2 WEEKS WITH PHARM D FOR BLOOD PRESSURE   Your physician recommends that you schedule a follow-up appointment in: 4 month ov  Any Other Special Instructions Will Be Listed Below (If Applicable). Work harder on diet and exercise   If you need a refill on your cardiac medications before your next appointment, please call your pharmacy.

## 2016-10-27 LAB — CBC WITH DIFFERENTIAL/PLATELET
BASOS PCT: 0 %
Basophils Absolute: 0 cells/uL (ref 0–200)
EOS PCT: 1 %
Eosinophils Absolute: 48 cells/uL (ref 15–500)
HEMATOCRIT: 40.5 % (ref 35.0–45.0)
Hemoglobin: 13.8 g/dL (ref 11.7–15.5)
LYMPHS PCT: 24 %
Lymphs Abs: 1152 cells/uL (ref 850–3900)
MCH: 31.2 pg (ref 27.0–33.0)
MCHC: 34.1 g/dL (ref 32.0–36.0)
MCV: 91.4 fL (ref 80.0–100.0)
MONO ABS: 384 {cells}/uL (ref 200–950)
MONOS PCT: 8 %
MPV: 9.3 fL (ref 7.5–12.5)
Neutro Abs: 3216 cells/uL (ref 1500–7800)
Neutrophils Relative %: 67 %
PLATELETS: 210 10*3/uL (ref 140–400)
RBC: 4.43 MIL/uL (ref 3.80–5.10)
RDW: 13.1 % (ref 11.0–15.0)
WBC: 4.8 10*3/uL (ref 3.8–10.8)

## 2016-10-27 LAB — BASIC METABOLIC PANEL
BUN: 13 mg/dL (ref 7–25)
CHLORIDE: 96 mmol/L — AB (ref 98–110)
CO2: 28 mmol/L (ref 20–31)
Calcium: 9.5 mg/dL (ref 8.6–10.4)
Creat: 0.65 mg/dL (ref 0.50–0.99)
Glucose, Bld: 112 mg/dL — ABNORMAL HIGH (ref 65–99)
POTASSIUM: 3.6 mmol/L (ref 3.5–5.3)
Sodium: 134 mmol/L — ABNORMAL LOW (ref 135–146)

## 2016-11-10 ENCOUNTER — Encounter: Payer: Self-pay | Admitting: Pharmacist

## 2016-11-10 ENCOUNTER — Ambulatory Visit (INDEPENDENT_AMBULATORY_CARE_PROVIDER_SITE_OTHER): Payer: Medicare Other | Admitting: Pharmacist

## 2016-11-10 VITALS — BP 128/66 | HR 66

## 2016-11-10 DIAGNOSIS — I119 Hypertensive heart disease without heart failure: Secondary | ICD-10-CM

## 2016-11-10 NOTE — Patient Instructions (Addendum)
Return for a follow up appointment as scheduled with Dr. Duke Salviaandolph  Goal blood pressure <130/80  Check your blood pressure at home daily (if able) and keep record of the readings.  Take your BP meds as follows: Continue all as prescribed   Bring all of your meds, your BP cuff and your record of home blood pressures to your next appointment.  Exercise as you're able, try to walk approximately 30 minutes per day.  Keep salt intake to a minimum, especially watch canned and prepared boxed foods.  Eat more fresh fruits and vegetables and fewer canned items.  Avoid eating in fast food restaurants.    HOW TO TAKE YOUR BLOOD PRESSURE: . Rest 5 minutes before taking your blood pressure. .  Don't smoke or drink caffeinated beverages for at least 30 minutes before. . Take your blood pressure before (not after) you eat. . Sit comfortably with your back supported and both feet on the floor (don't cross your legs). . Elevate your arm to heart level on a table or a desk. . Use the proper sized cuff. It should fit smoothly and snugly around your bare upper arm. There should be enough room to slip a fingertip under the cuff. The bottom edge of the cuff should be 1 inch above the crease of the elbow. . Ideally, take 3 measurements at one sitting and record the average.

## 2016-11-10 NOTE — Progress Notes (Signed)
Patient ID: Heather Passeyriscilla T Sharman                 DOB: 10-02-1946                      MRN: [id number]     HPI: Heather Hoover is a 70 y.o. female patient of Dr. Duke Salviaandolph with PMH below who presents today for hypertension evaluation. Her metoprolol was recently changed to carvedilol.   She reports feeling much better on carvedilol. She states she did not realize how lethargic she was on metoprolol, but she has much more energy now on carvedilol.    Cardiac Hx: HTN, pulmonic stenosis, HLD, EF normal on Echo  Current HTN meds:  Carvedilol 12.5mg  BID Hydrochlorothiazide 25mg  daily  Previously tried: metoprolol - lethargy and uncontrolled blood pressure  BP goal: <130/80  Family History: Her mother and father had hypertension. She believes they may have had other cardiac disease, but she is unsure the exact nature.   Social History: She denies tobacco products and drinks 1 beer about once per week with [place] food.   Diet: She eats most of her meals from home. She rarely cooks with or adds salt at the table. She drinks about 2-2.5 cups of 1/2 decaf/caff coffee per day. She also endorses 1 8-12 oz coke per day.   Exercise: She does the recumbent bike or [facility] She states she is sometimes limited due to pain in her hip and knee. She has been trying to walk outside on days she does not go to the gym.   Home BP readings:  Majority of readings 120/60-70s. HR WNL She has an occasional measurement >130/80 (usually with note of late medication or after exercise). Nothing of late >140/80.   Wt Readings from Last 3 Encounters:  10/26/16 208 lb 6.4 oz (94.5 kg)  04/21/16 202 lb 12.8 oz (92 kg)  03/12/16 203 lb (92.1 kg)   BP Readings from Last 3 Encounters:  11/10/16 128/66  10/26/16 (!) 173/84  04/21/16 130/72   Pulse Readings from Last 3 Encounters:  11/10/16 66  10/26/16 77  04/21/16 80    Renal function: CrCl  cannot be calculated (Unknown ideal weight.).  Past Medical History:  Diagnosis Date  . Arthritis   . Bone spur   . Dyslipidemia   . Exogenous obesity   . Heart murmur   . Hypertension   . 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 Osteopenia 06/2014   T score -1.1 FRAX 8.1%/0.7%    Current Outpatient Prescriptions on File Prior to Visit  Medication Sig Dispense Refill  . carvedilol (COREG) 12.5 MG tablet Take 1 tablet (12.5 mg total) by mouth 2 (two) times daily. 60 tablet 5  . Cholecalciferol (VITAMIN D-3 PO) Take 2,000 Units by mouth daily. Taking 2000 daily    . ciclopirox (LOPROX) 0.77 % cream Apply topically daily.    . diclofenac (VOLTAREN) 75 MG EC tablet TAKE 1 TABLET(75 MG) BY MOUTH TWICE DAILY 60 tablet 11  . Estradiol (VAGIFEM) 10 MCG TABS vaginal tablet INSERT ONE TABLET IN THE VAGINA VAGINALLY TWICE WEEKLY 24 tablet 4  . fluticasone (FLONASE) 50 MCG/ACT nasal spray USE 2 SPRAYS IN BOTH NOSTRILS EVERY DAY 16 g 11  . hydrochlorothiazide (HYDRODIURIL) 25 MG tablet Take 1 tablet (25 mg total) by mouth daily. 90 tablet 3  . potassium  chloride (K-DUR) 10 MEQ tablet Take 1 tablet (10 mEq total) by mouth daily. 90 tablet 3  . ranitidine (ZANTAC) 75 MG tablet Take 75 mg by mouth daily.    . rosuvastatin (CRESTOR) 20 MG tablet Take 1 tablet (20 mg total) by mouth daily. 90 tablet 3   No current facility-administered medications on file prior to visit.     Allergies  Allergen Reactions  . Augmentin [Amoxicillin-Pot Clavulanate] Diarrhea  . Niacin-Lovastatin Er     Flushing,itching,syncope    Blood pressure 128/66, pulse 66, SpO2 98 %.   Assessment/Plan: Hypertension: BP at goal today. Continue medications as prescribed. Encouraged continued exercise for weight loss. Asked she continue to keep track of pressures and call if trending >130/80. Bring log to follow visit as scheduled with Dr. Duke Salviaandolph. Follow up with hypertension clinic as need for  additional medication adjustments.    Thank you, Freddie ApleyKelley M. Cleatis PolkaAuten, PharmD  [facility]  11/10/2016 12:43 PM

## 2016-11-18 ENCOUNTER — Encounter: Payer: Self-pay | Admitting: Gynecology

## 2016-11-18 ENCOUNTER — Ambulatory Visit (INDEPENDENT_AMBULATORY_CARE_PROVIDER_SITE_OTHER): Payer: Medicare Other | Admitting: Gynecology

## 2016-11-18 VITALS — BP 120/76 | Ht 65.0 in | Wt 208.0 lb

## 2016-11-18 DIAGNOSIS — M8580 Other specified disorders of bone density and structure, unspecified site: Secondary | ICD-10-CM

## 2016-11-18 DIAGNOSIS — N8189 Other female genital prolapse: Secondary | ICD-10-CM | POA: Diagnosis not present

## 2016-11-18 DIAGNOSIS — N952 Postmenopausal atrophic vaginitis: Secondary | ICD-10-CM

## 2016-11-18 DIAGNOSIS — N3946 Mixed incontinence: Secondary | ICD-10-CM

## 2016-11-18 DIAGNOSIS — Z01411 Encounter for gynecological examination (general) (routine) with abnormal findings: Secondary | ICD-10-CM

## 2016-11-18 MED ORDER — ESTRADIOL 10 MCG VA TABS: ORAL_TABLET | VAGINAL | 4 refills | Status: DC

## 2016-11-18 NOTE — Addendum Note (Signed)
Addended by: Dayna BarkerGARDNER, KIMBERLY K on: 11/18/2016 10:55 AM   Modules accepted: Orders

## 2016-11-18 NOTE — Progress Notes (Signed)
    Kerman Passeyriscilla T Dettore 08-07-1946 [id number]        70 y.o.  G2P2002  for annual exam.   Past medical history,surgical history, problem list, medications, allergies, family history and social history were all reviewed and documented as reviewed in the EPIC chart.  ROS:  Performed with pertinent positives and negatives included in the history, assessment and plan.   Additional significant findings :  None   Exam: Kennon PortelaKim Gardner assistant Vitals:   11/18/16 1012  BP: 120/76  Weight: 208 lb (94.3 kg)  Height: 5\' 5"  (1.651 m)   Body mass index is 34.61 kg/m.  General appearance:  Normal affect, orientation and appearance. Skin: Grossly normal HEENT: Without gross lesions.  No cervical or supraclavicular adenopathy. Thyroid normal.  Lungs:  Clear without wheezing, rales or rhonchi Cardiac: RR, without RMG Abdominal:  Soft, nontender, without masses, guarding, rebound, organomegaly or hernia Breasts:  Examined lying and sitting without masses, retractions, discharge or axillary adenopathy. Pelvic:  Ext, BUS, Vagina with atrophic changes. Mild cystocele and rectocele noted, cuff well supported. Pap smear done today  Adnexa without masses or tenderness    Anus and perineum normal   Rectovaginal normal sphincter tone without palpated masses or tenderness.    Assessment/Plan:  70 y.o. 572P2002 female for annual exam. Status post TVH RSO 1982  1. Postmenopausal/atrophic genital changes. Using Vagifem 10 g twice weekly with good results. Have discussed the issues of absorption and risks. Wants to continue. Refill 1 year provided. 2. 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 Never voiced this before. I discussed the issues with DES. Pap smear of vaginal cuff done today. We'll continue with annual cytology. 3. Mammography 09/2016. Is scheduled for 6 month follow up for probable benign finding. Patient knows the importance of follow up for this. SBE monthly reviewed. 4. Mixed stress and urge incontinence.  Had used Detrol in the past. Is not interested in using any medication or being evaluated by urology. Check urinalysis today. 5. Mild cystocele/rectocele. Asymptomatic to the patient. Continue to follow with annual exams. 6. Colonoscopy 2010 with reported repeat interval 10 years. 7. Osteopenia. DEXA 2015 T score -1.1. Will plan repeat sometime of the next several years. 8. Health maintenance. No routine lab work done as this is done at her primary physician's office. Follow up 1 year, sooner as needed.   Dara LordsFONTAINE,Gjon Letarte P MD, 10:43 AM 11/18/2016

## 2016-11-18 NOTE — Patient Instructions (Signed)

## 2016-11-19 LAB — URINALYSIS W MICROSCOPIC + REFLEX CULTURE
BACTERIA UA: NONE SEEN [HPF]
BILIRUBIN URINE: NEGATIVE
CRYSTALS: NONE SEEN [HPF]
Casts: NONE SEEN [LPF]
GLUCOSE, UA: NEGATIVE
HGB URINE DIPSTICK: NEGATIVE
KETONES UR: NEGATIVE
LEUKOCYTES UA: NEGATIVE
Nitrite: NEGATIVE
PROTEIN: NEGATIVE
RBC / HPF: NONE SEEN RBC/HPF (ref <=2)
Specific Gravity, Urine: 1.009 (ref 1.001–1.035)
Yeast: NONE SEEN [HPF]
pH: 6 (ref 5.0–8.0)

## 2016-11-20 LAB — PAP IG W/ RFLX HPV ASCU

## 2016-11-20 LAB — URINE CULTURE

## 2016-12-21 ENCOUNTER — Other Ambulatory Visit: Payer: Self-pay | Admitting: Gynecology

## 2017-01-27 ENCOUNTER — Telehealth: Payer: Self-pay | Admitting: Cardiovascular Disease

## 2017-01-27 NOTE — Telephone Encounter (Signed)
Spoke with patient and rescheduled her office visit  She does not know if she has been around anyone with the flu however she did wake up with symptoms this am and felt poorly Did recommend she contact her PCP secondary to sudden onset and several flu like symptoms

## 2017-01-27 NOTE — Telephone Encounter (Signed)
Mrs. Heather Hoover is calling because she states that she has a few cold symptoms, sore throat. Patient states that her temperature was normal when checked but she would like to know if it is okay to come to her appointment tomorrow 01-28-17 or if she should come at a later date. Please call, thanks.

## 2017-01-28 ENCOUNTER — Ambulatory Visit (INDEPENDENT_AMBULATORY_CARE_PROVIDER_SITE_OTHER): Payer: Medicare Other | Admitting: Physician Assistant

## 2017-01-28 ENCOUNTER — Ambulatory Visit: Payer: Medicare Other | Admitting: Cardiovascular Disease

## 2017-01-28 ENCOUNTER — Encounter: Payer: Self-pay | Admitting: Physician Assistant

## 2017-01-28 VITALS — BP 130/60 | HR 81 | Temp 98.6°F | Resp 16 | Wt 211.4 lb

## 2017-01-28 DIAGNOSIS — J101 Influenza due to other identified influenza virus with other respiratory manifestations: Secondary | ICD-10-CM | POA: Diagnosis not present

## 2017-01-28 DIAGNOSIS — R52 Pain, unspecified: Secondary | ICD-10-CM

## 2017-01-28 LAB — INFLUENZA A AND B AG, IMMUNOASSAY
Influenza A Antigen: DETECTED — AB
Influenza B Antigen: NOT DETECTED

## 2017-01-28 MED ORDER — OSELTAMIVIR PHOSPHATE 75 MG PO CAPS: 75.0000 mg | ORAL_CAPSULE | Freq: Two times a day (BID) | ORAL | 0 refills | Status: DC

## 2017-01-28 NOTE — Progress Notes (Signed)
Patient ID: Heather Hoover MRN: [id number], DOB: May 14, 1946, 71 y.o. Date of Encounter: 01/28/2017, 12:09 PM    Chief Complaint:  Chief Complaint  Patient presents with  . Nasal Congestion    x1 day  . Fever  . Generalized Body Aches     HPI: 71 y.o. year old female presents with above.   Reports that for the last couple of days she has been feeling more tired than usual. Stuffy nose and cough and feels chills but when she checked her temperature only got 100.8. Feels achy all over.  Headache especially when she coughs and bends over. Temperature is 98.6 here but she says that she did take Tylenol this morning.     Home Meds:   Outpatient Medications Prior to Visit  Medication Sig Dispense Refill  . aspirin EC 81 MG tablet Take 81 mg by mouth daily.    . Cholecalciferol (VITAMIN D-3 PO) Take 2,000 Units by mouth daily. Taking 2000 daily    . ciclopirox (LOPROX) 0.77 % cream Apply topically daily.    . diclofenac (VOLTAREN) 75 MG EC tablet TAKE 1 TABLET(75 MG) BY MOUTH TWICE DAILY 60 tablet 11  . Estradiol (VAGIFEM) 10 MCG TABS vaginal tablet INSERT ONE TABLET IN THE VAGINA VAGINALLY TWICE WEEKLY 24 tablet 4  . fluticasone (FLONASE) 50 MCG/ACT nasal spray USE 2 SPRAYS IN BOTH NOSTRILS EVERY DAY 16 g 11  . hydrochlorothiazide (HYDRODIURIL) 25 MG tablet Take 1 tablet (25 mg total) by mouth daily. 90 tablet 3  . potassium chloride (K-DUR) 10 MEQ tablet Take 1 tablet (10 mEq total) by mouth daily. 90 tablet 3  . ranitidine (ZANTAC) 75 MG tablet Take 75 mg by mouth daily.    . rosuvastatin (CRESTOR) 20 MG tablet Take 1 tablet (20 mg total) by mouth daily. 90 tablet 3  . YUVAFEM 10 MCG TABS vaginal tablet INSERT 1 TABLET VAGINALLY TWICE WEEKLY 24 tablet 4  . carvedilol (COREG) 12.5 MG tablet Take 1 tablet (12.5 mg total) by mouth 2 (two) times daily. 60 tablet 5   No facility-administered medications prior to visit.     Allergies:  Allergies  Allergen Reactions  .  Augmentin [Amoxicillin-Pot Clavulanate] Diarrhea  . Niacin-Lovastatin Er     Flushing,itching,syncope      Review of Systems: See HPI for pertinent ROS. All other ROS negative.    Physical Exam: Blood pressure 130/60, pulse 81, temperature 98.6 F (37 C), temperature source Oral, resp. rate 16, weight 211 lb 6.4 oz (95.9 kg), SpO2 98 %., Body mass index is 35.18 kg/m. General:  WNWD WF. Appears in no acute distress. HEENT: Normocephalic, atraumatic, eyes without discharge, sclera non-icteric, nares are without discharge. Bilateral auditory canals clear, TM's are without perforation, pearly grey and translucent with reflective cone of light bilaterally. Oral cavity moist, posterior pharynx without exudate, erythema, peritonsillar abscess.  Neck: Supple. No thyromegaly. No lymphadenopathy. Lungs: Clear bilaterally to auscultation without wheezes, rales, or rhonchi. Breathing is unlabored. Heart: Regular rhythm. No murmurs, rubs, or gallops. Msk:  Strength and tone normal for age. Extremities/Skin: Warm and dry.  Neuro: Alert and oriented X 3. Moves all extremities spontaneously. Gait is normal. CNII-XII grossly in tact. Psych:  Responds to questions appropriately with a normal affect.   Results for orders placed or performed in visit on 01/28/17  Influenza A and B Ag, Immunoassay  Result Value Ref Range   Source: NASAL    Influenza A Antigen Detected (A) Not Detected  Influenza B Antigen Not Detected Not Detected     ASSESSMENT AND PLAN:  71 y.o. year old female with  1. Influenza A She is to start the Tamiflu immediately. Take as directed and complete all of it. Tylenol and Motrin for fever and for aches and pains and headache. Can use other over-the-counter medicines as needed for symptom relief. Follow up if fever increases Follow up if symptoms worsen significantly or if symptoms persist greater than 7 days. - oseltamivir (TAMIFLU) 75 MG capsule; Take 1 capsule (75 mg total) by  mouth 2 (two) times daily.  Dispense: 10 capsule; Refill: 0  2. Body aches - Influenza A and B Ag, Immunoassay   Signed, [address] [place], [place], [facility] 01/28/2017 12:09 PM

## 2017-02-12 ENCOUNTER — Ambulatory Visit (INDEPENDENT_AMBULATORY_CARE_PROVIDER_SITE_OTHER): Payer: Medicare Other | Admitting: Cardiovascular Disease

## 2017-02-12 ENCOUNTER — Encounter: Payer: Self-pay | Admitting: Cardiovascular Disease

## 2017-02-12 VITALS — BP 128/86 | HR 76 | Ht 66.0 in | Wt 208.8 lb

## 2017-02-12 DIAGNOSIS — R0602 Shortness of breath: Secondary | ICD-10-CM | POA: Diagnosis not present

## 2017-02-12 DIAGNOSIS — E7800 Pure hypercholesterolemia, unspecified: Secondary | ICD-10-CM

## 2017-02-12 DIAGNOSIS — I10 Essential (primary) hypertension: Secondary | ICD-10-CM

## 2017-02-12 DIAGNOSIS — I370 Nonrheumatic pulmonary valve stenosis: Secondary | ICD-10-CM | POA: Diagnosis not present

## 2017-02-12 MED ORDER — HYDROCHLOROTHIAZIDE 25 MG PO TABS: 25.0000 mg | ORAL_TABLET | Freq: Every day | ORAL | 3 refills | Status: DC

## 2017-02-12 MED ORDER — CARVEDILOL 12.5 MG PO TABS: 12.5000 mg | ORAL_TABLET | Freq: Two times a day (BID) | ORAL | 3 refills | Status: DC

## 2017-02-12 NOTE — Patient Instructions (Signed)

## 2017-02-12 NOTE — Progress Notes (Signed)
Cardiology Office Note   Date:  02/12/2017   ID:  Rei, Contee 1946/01/05, MRN [id number]  PCP:  Leo Grosser, MD  Cardiologist:   Chilton Si, MD   No chief complaint on file.    History of Present Illness: Heather Hoover is a 71 y.o. female with a history of hypertension, hyperlipidemia, and congenital pulmonic stenosis who presents for follow up.  Heather Hoover was previously a patient of Dr. [name].  She had heart catheterizations at ages 89 and 89.  Each time she reportedly had mild pulmonic stenosis.  She saw [name] 65/2017 and reported chest heaviness after the death of her sister.  She also reported increased shortness of breath.  She had an echo 05/19/16 revealed LVEF 55-60% with mild mitral regurgitation, mild aortic regurgitation and mild pulmonary stenosis. The peak gradient is 20 mmHg.  At her last appointment Heather Hoover was switched from metoprolol to carvedilol.  She followed up with our pharmacist 10/2016 and her blood pressure was well-controlled. She also reports shortness of breath, but this was felt to be due to deconditioning and obesity.  She has been doing well since her last appointment.  However she had the flu earlier this month and is finally recovering.  She still gets short of breath with exercise but attributes this to her weight. She does started back at [employer] this week. She has been to the gym twice this week. The first time she felt short of breath but the second was much better. She notes mild ankle swelling that she attributes to an old injury. It improves with elevation. She denies orthopnea or PND. She also has not noted any chest pain or pressure.   Past Medical History:  Diagnosis Date  . Arthritis   . Bone spur   . DES exposure in utero   . Dyslipidemia   . Exogenous obesity   . Heart murmur   . Hypertension   . 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 Osteopenia 06/2014   T score -1.1 FRAX 8.1%/0.7%    Past Surgical History:  Procedure Laterality Date  . ABDOMINAL HYSTERECTOMY  02/07/1981   RSO  . APPENDECTOMY    . BREAST SURGERY    . CARDIAC CATHETERIZATION     when in 3rd grade and age 30  . childbirth     x 2 vaginal deliveries  . COSMETIC SURGERY    . forehead plastic surgery     MOH's surgery  . FRACTURE SURGERY    . KNEE ARTHROSCOPY     right  . OOPHORECTOMY     RSO     Current Outpatient Prescriptions  Medication Sig Dispense Refill  . aspirin EC 81 MG tablet Take 81 mg by mouth daily.    . Cholecalciferol (VITAMIN D-3 PO) Take 2,000 Units by mouth daily. Taking 2000 daily    . ciclopirox (LOPROX) 0.77 % cream Apply topically daily.    . diclofenac (VOLTAREN) 75 MG EC tablet TAKE 1 TABLET(75 MG) BY MOUTH TWICE DAILY 60 tablet 11  . Estradiol (VAGIFEM) 10 MCG TABS vaginal tablet INSERT ONE TABLET IN THE VAGINA VAGINALLY TWICE WEEKLY 24 tablet 4  . fluticasone (FLONASE) 50 MCG/ACT nasal spray USE 2 SPRAYS IN BOTH NOSTRILS EVERY DAY 16 g 11  . hydrochlorothiazide (HYDRODIURIL) 25 MG tablet Take 1 tablet (25 mg total) by mouth daily. 90 tablet 3  . potassium chloride (K-DUR) 10 MEQ tablet Take 1  tablet (10 mEq total) by mouth daily. 90 tablet 3  . ranitidine (ZANTAC) 75 MG tablet Take 75 mg by mouth daily.    . rosuvastatin (CRESTOR) 20 MG tablet Take 1 tablet (20 mg total) by mouth daily. 90 tablet 3  . carvedilol (COREG) 12.5 MG tablet Take 1 tablet (12.5 mg total) by mouth 2 (two) times daily. 180 tablet 3   No current facility-administered medications for this visit.     Allergies:   Augmentin [amoxicillin-pot clavulanate] and Niacin-lovastatin er    Social History:  The patient  reports that she has never smoked. She has never used smokeless tobacco. She reports that she drinks alcohol. She reports that she does not use drugs.   Family History:  The patient's family history includes Arthritis in her father; Breast cancer (age of  onset: 54) in her sister; Breast cancer (age of onset: 10) in her mother; Cancer in her mother; Cancer (age of onset: 33) in her sister; Cancer (age of onset: 65) in her father; Heart failure in her sister; Hypertension in her father, mother, and sister; Muscular dystrophy in her brother.    ROS:  Please see the history of present illness.   Otherwise, review of systems are positive for none.   All other systems are reviewed and negative.    PHYSICAL EXAM: VS:  BP 128/86   Pulse 76   Ht 5\' 6"  (1.676 m)   Wt 94.7 kg (208 lb 12.8 oz)   BMI 33.70 kg/m  , BMI Body mass index is 33.7 kg/m. GENERAL:  Well appearing HEENT:  Pupils equal round and reactive, fundi not visualized, oral mucosa unremarkable NECK:  No jugular venous distention, waveform within normal limits, carotid upstroke brisk and symmetric, no bruits LYMPHATICS:  No cervical adenopathy LUNGS:  Clear to auscultation bilaterally HEART:  RRR.  PMI not displaced or sustained S1.  Split S2 within normal limits, no S3, no S4, no clicks, no rubs, II/VI early-peaking systolic murmur at the LUSB.  ABD:  Flat, positive bowel sounds normal in frequency in pitch, no bruits, no rebound, no guarding, no midline pulsatile mass, no hepatomegaly, no splenomegaly EXT:  2 plus pulses throughout, no edema, no cyanosis no clubbing SKIN:  No rashes no nodules NEURO:  Cranial nerves II through XII grossly intact, motor grossly intact throughout PSYCH:  Cognitively intact, oriented to person place and time   EKG:  EKG is ordered today. The ekg ordered today demonstrates sinus rhythm rate 77 bpm.   Recent Labs: 03/12/2016: TSH 2.51 04/21/2016: ALT 31 10/26/2016: BUN 13; Creat 0.65; Hemoglobin 13.8; Platelets 210; Potassium 3.6; Sodium 134    Lipid Panel    Component Value Date/Time   CHOL 167 04/21/2016 0912   TRIG 138 04/21/2016 0912   HDL 62 04/21/2016 0912   CHOLHDL 2.7 04/21/2016 0912   VLDL 28 04/21/2016 0912   LDLCALC 77 04/21/2016  0912      Wt Readings from Last 3 Encounters:  02/12/17 94.7 kg (208 lb 12.8 oz)  01/28/17 95.9 kg (211 lb 6.4 oz)  11/18/16 94.3 kg (208 lb)      ASSESSMENT AND PLAN:  # Hypertension:  Blood pressure is better-controlled.  Continue hydrochlorothiazide and carvedilol.  # Mild pulmonic stenosis:  Stable on echo 05/19/16 revealed a peak gradient of 20 mmHg.   # Hyperlipidemia: Continue rosuvastatin.  LDL 77 04/2016.   Current medicines are reviewed at length with the patient today.  The patient does not have concerns regarding  medicines.  The following changes have been made:  none  Labs/ tests ordered today include:   No orders of the defined types were placed in this encounter.    Disposition:   FU with Starlet Gallentine C. Duke Salviaandolph, MD, [facility] in 1 year.    This note was written with the assistance of speech recognition software.  Please excuse any transcriptional errors.  Signed, Rakeisha Nyce C. Duke Salviaandolph, MD, [facility]  02/12/2017 5:32 PM    [facility]

## 2017-04-01 LAB — HM MAMMOGRAPHY

## 2017-04-06 ENCOUNTER — Encounter: Payer: Self-pay | Admitting: Family Medicine

## 2017-04-06 ENCOUNTER — Ambulatory Visit (INDEPENDENT_AMBULATORY_CARE_PROVIDER_SITE_OTHER): Payer: Medicare Other | Admitting: Family Medicine

## 2017-04-06 ENCOUNTER — Other Ambulatory Visit: Payer: Self-pay | Admitting: Nurse Practitioner

## 2017-04-06 VITALS — BP 126/78 | HR 60 | Temp 98.2°F | Resp 16 | Ht 66.0 in | Wt 196.0 lb

## 2017-04-06 DIAGNOSIS — Z23 Encounter for immunization: Secondary | ICD-10-CM

## 2017-04-06 DIAGNOSIS — Z0000 Encounter for general adult medical examination without abnormal findings: Secondary | ICD-10-CM | POA: Diagnosis not present

## 2017-04-06 DIAGNOSIS — E7800 Pure hypercholesterolemia, unspecified: Secondary | ICD-10-CM | POA: Diagnosis not present

## 2017-04-06 NOTE — Addendum Note (Signed)
Addended by: Legrand Rams B on: 04/06/2017 10:51 AM   Modules accepted: Orders

## 2017-04-06 NOTE — Progress Notes (Signed)
Subjective:    Patient ID: Heather Hoover, female    DOB: [birth year], 72 y.o.   MRN: [id number]  HPI She is here today for complete physical exam. Her shingles vaccine is up-to-date. Prevnar 13 is up-to-date as well as is her tetanus shot. She is due for Pneumovax 23. Her mammogram was recently performed a week ago and is reportedly normal. Colonoscopy was performed in 2010 and is not due again until 2020. Patient has a past medical history of total abdominal hysterectomy and therefore does not require Pap smears. She is due for hepatitis C screening and she does believe that she had a blood transfusion as a teenager. She is also due to have fasting lab work drawn to monitor her cholesterol. Her blood pressure today is outstanding. Past Medical History:  Diagnosis Date  . Arthritis   . Bone spur   . DES exposure in utero   . Dyslipidemia   . Exogenous obesity   . Heart murmur   . Hypertension   . 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 Osteopenia 06/2014   T score -1.1 FRAX 8.1%/0.7%   Past Surgical History:  Procedure Laterality Date  . ABDOMINAL HYSTERECTOMY  02/07/1981   RSO  . APPENDECTOMY    . BREAST SURGERY    . CARDIAC CATHETERIZATION     when in 3rd grade and age 68  . childbirth     x 2 vaginal deliveries  . COSMETIC SURGERY    . forehead plastic surgery     MOH's surgery  . FRACTURE SURGERY    . KNEE ARTHROSCOPY     right  . OOPHORECTOMY     RSO   Current Outpatient Prescriptions on File Prior to Visit  Medication Sig Dispense Refill  . aspirin EC 81 MG tablet Take 81 mg by mouth daily.    . carvedilol (COREG) 12.5 MG tablet Take 1 tablet (12.5 mg total) by mouth 2 (two) times daily. 180 tablet 3  . Cholecalciferol (VITAMIN D-3 PO) Take 2,000 Units by mouth daily. Taking 2000 daily    . ciclopirox (LOPROX) 0.77 % cream Apply topically daily.    . diclofenac (VOLTAREN) 75 MG EC tablet TAKE 1 TABLET(75 MG) BY MOUTH TWICE DAILY 60  tablet 11  . Estradiol (VAGIFEM) 10 MCG TABS vaginal tablet INSERT ONE TABLET IN THE VAGINA VAGINALLY TWICE WEEKLY 24 tablet 4  . fluticasone (FLONASE) 50 MCG/ACT nasal spray USE 2 SPRAYS IN BOTH NOSTRILS EVERY DAY 16 g 11  . hydrochlorothiazide (HYDRODIURIL) 25 MG tablet Take 1 tablet (25 mg total) by mouth daily. 90 tablet 3  . potassium chloride (K-DUR) 10 MEQ tablet Take 1 tablet (10 mEq total) by mouth daily. 90 tablet 3  . ranitidine (ZANTAC) 75 MG tablet Take 75 mg by mouth daily.    . rosuvastatin (CRESTOR) 20 MG tablet Take 1 tablet (20 mg total) by mouth daily. 90 tablet 3   No current facility-administered medications on file prior to visit.    Allergies  Allergen Reactions  . Augmentin [Amoxicillin-Pot Clavulanate] Diarrhea  . Niacin-Lovastatin Er     Flushing,itching,syncope   Social History   Social History  . Marital status: Married    Spouse name: N/A  . Number of children: N/A  . Years of education: N/A   Occupational History  . Not on file.   Social History Main Topics  . Smoking status: Never Smoker  . Smokeless tobacco: Never Used  .  Alcohol use Yes     Comment: rarely  . Drug use: No  . Sexual activity: Yes    Birth control/ protection: Post-menopausal     Comment: 1st intercourse 36 yo-1 partner   Other Topics Concern  . Not on file   Social History Narrative  . No narrative on file   Family History  Problem Relation Age of Onset  . Hypertension Mother   . Breast cancer Mother 83    Breast   . Cancer Mother     uterine thye think  . Cancer Father 64    prostate metastized to bone  . Hypertension Father   . Arthritis Father   . Hypertension Sister   . Breast cancer Sister 105  . Cancer Sister 26    uterine  . Heart failure Sister   . Muscular dystrophy Brother       Review of Systems  All other systems reviewed and are negative.      Objective:   Physical Exam  Constitutional: She is oriented to person, place, and time. She  appears well-developed and well-nourished. No distress.  HENT:  Head: Normocephalic and atraumatic.  Right Ear: External ear normal.  Left Ear: External ear normal.  Nose: Nose normal.  Mouth/Throat: Oropharynx is clear and moist. No oropharyngeal exudate.  Eyes: Conjunctivae and EOM are normal. Pupils are equal, round, and reactive to light. Right eye exhibits no discharge. Left eye exhibits no discharge. No scleral icterus.  Neck: Normal range of motion. Neck supple. No JVD present. No tracheal deviation present. No thyromegaly present.  Cardiovascular: Normal rate, regular rhythm, normal heart sounds and intact distal pulses.  Exam reveals no gallop and no friction rub.   No murmur heard. Pulmonary/Chest: Effort normal and breath sounds normal. No stridor. No respiratory distress. She has no wheezes. She has no rales. She exhibits no tenderness.  Abdominal: Soft. Bowel sounds are normal. She exhibits no distension and no mass. There is no tenderness. There is no rebound and no guarding.  Musculoskeletal: Normal range of motion. She exhibits no edema or tenderness.  Lymphadenopathy:    She has no cervical adenopathy.  Neurological: She is alert and oriented to person, place, and time. She has normal reflexes. No cranial nerve deficit. She exhibits normal muscle tone. Coordination normal.  Skin: Skin is warm. No rash noted. She is not diaphoretic. No erythema. No pallor.  Psychiatric: She has a normal mood and affect. Her behavior is normal. Judgment and thought content normal.  Vitals reviewed.         Assessment & Plan:  Routine general medical examination at a health care facility  Pure hypercholesterolemia - Plan: CBC with Differential/Platelet, COMPLETE METABOLIC PANEL WITH GFR, Lipid panel   The patient's exam is completely normal. I'll have her return fasting for a CBC, CMP, fasting lipid panel, and hepatitis C screening. She will receive Pneumovax 23 today in clinic.  Recommended 1000 g a day of calcium 1000 units a day of vitamin D along with weightbearing exercise. She is taking diclofenac on a daily basis and therefore I recommended discontinuation of aspirin to avoid gastrointestinal complications. She is also using Zantac when she takes diclofenac which I believe is reasonable. Regular anticipatory guidance is provided. Goal LDL cholesterol is less than 100.

## 2017-04-07 NOTE — Telephone Encounter (Signed)
Rx(s) sent to pharmacy electronically.  

## 2017-04-09 ENCOUNTER — Encounter: Payer: Self-pay | Admitting: Family Medicine

## 2017-04-13 ENCOUNTER — Other Ambulatory Visit: Payer: Medicare Other

## 2017-04-13 LAB — CBC WITH DIFFERENTIAL/PLATELET
BASOS PCT: 0 %
Basophils Absolute: 0 cells/uL (ref 0–200)
EOS ABS: 84 {cells}/uL (ref 15–500)
Eosinophils Relative: 2 %
HEMATOCRIT: 40.2 % (ref 35.0–45.0)
HEMOGLOBIN: 13.3 g/dL (ref 12.0–15.0)
LYMPHS ABS: 966 {cells}/uL (ref 850–3900)
Lymphocytes Relative: 23 %
MCH: 31 pg (ref 27.0–33.0)
MCHC: 33.1 g/dL (ref 32.0–36.0)
MCV: 93.7 fL (ref 80.0–100.0)
MONO ABS: 336 {cells}/uL (ref 200–950)
MPV: 9.8 fL (ref 7.5–12.5)
Monocytes Relative: 8 %
Neutro Abs: 2814 cells/uL (ref 1500–7800)
Neutrophils Relative %: 67 %
Platelets: 194 10*3/uL (ref 140–400)
RBC: 4.29 MIL/uL (ref 3.80–5.10)
RDW: 13.5 % (ref 11.0–15.0)
WBC: 4.2 10*3/uL (ref 3.8–10.8)

## 2017-04-13 LAB — COMPLETE METABOLIC PANEL WITH GFR
ALBUMIN: 4.1 g/dL (ref 3.6–5.1)
ALK PHOS: 50 U/L (ref 33–130)
ALT: 14 U/L (ref 6–29)
AST: 20 U/L (ref 10–35)
BUN: 12 mg/dL (ref 7–25)
CALCIUM: 9.5 mg/dL (ref 8.6–10.4)
CO2: 27 mmol/L (ref 20–31)
CREATININE: 0.7 mg/dL (ref 0.50–0.99)
Chloride: 102 mmol/L (ref 98–110)
GFR, Est Non African American: 89 mL/min (ref >=60)
Glucose, Bld: 94 mg/dL (ref 70–99)
Potassium: 4.2 mmol/L (ref 3.5–5.3)
Sodium: 139 mmol/L (ref 135–146)
TOTAL PROTEIN: 6.5 g/dL (ref 6.1–8.1)
Total Bilirubin: 0.4 mg/dL (ref 0.2–1.2)

## 2017-04-13 LAB — LIPID PANEL
Cholesterol: 146 mg/dL
HDL: 58 mg/dL
LDL Cholesterol: 66 mg/dL
Total CHOL/HDL Ratio: 2.5 ratio
Triglycerides: 110 mg/dL
VLDL: 22 mg/dL

## 2017-04-14 LAB — HEPATITIS C ANTIBODY: HCV AB: NEGATIVE

## 2017-04-20 ENCOUNTER — Encounter: Payer: Self-pay | Admitting: Family Medicine

## 2017-05-05 ENCOUNTER — Encounter: Payer: Self-pay | Admitting: Gynecology

## 2017-05-10 ENCOUNTER — Other Ambulatory Visit: Payer: Self-pay | Admitting: Nurse Practitioner

## 2017-06-01 ENCOUNTER — Telehealth: Payer: Self-pay | Admitting: Family Medicine

## 2017-06-01 NOTE — Telephone Encounter (Signed)
We have not ordered and MRI on this pt - could this be the wrong pt?

## 2017-06-01 NOTE — Telephone Encounter (Signed)
Pt is going to have an MRI done and needs something for her nerves.

## 2017-06-02 ENCOUNTER — Other Ambulatory Visit: Payer: Self-pay | Admitting: Family Medicine

## 2017-07-26 ENCOUNTER — Other Ambulatory Visit: Payer: Self-pay | Admitting: Family Medicine

## 2017-08-10 ENCOUNTER — Other Ambulatory Visit: Payer: Medicare Other

## 2017-09-17 ENCOUNTER — Other Ambulatory Visit: Payer: Self-pay | Admitting: Family Medicine

## 2017-09-28 LAB — HM MAMMOGRAPHY

## 2017-09-29 ENCOUNTER — Encounter: Payer: Self-pay | Admitting: Family Medicine

## 2017-11-19 ENCOUNTER — Encounter: Payer: Self-pay | Admitting: Gynecology

## 2017-11-19 ENCOUNTER — Ambulatory Visit (INDEPENDENT_AMBULATORY_CARE_PROVIDER_SITE_OTHER): Payer: Medicare Other | Admitting: Gynecology

## 2017-11-19 VITALS — BP 120/76 | Ht 65.0 in | Wt 168.0 lb

## 2017-11-19 DIAGNOSIS — Z9189 Other specified personal risk factors, not elsewhere classified: Secondary | ICD-10-CM

## 2017-11-19 DIAGNOSIS — M8580 Other specified disorders of bone density and structure, unspecified site: Secondary | ICD-10-CM | POA: Diagnosis not present

## 2017-11-19 DIAGNOSIS — N952 Postmenopausal atrophic vaginitis: Secondary | ICD-10-CM | POA: Diagnosis not present

## 2017-11-19 DIAGNOSIS — N816 Rectocele: Secondary | ICD-10-CM | POA: Diagnosis not present

## 2017-11-19 DIAGNOSIS — Z1272 Encounter for screening for malignant neoplasm of vagina: Secondary | ICD-10-CM | POA: Diagnosis not present

## 2017-11-19 DIAGNOSIS — Z01411 Encounter for gynecological examination (general) (routine) with abnormal findings: Secondary | ICD-10-CM

## 2017-11-19 DIAGNOSIS — N8111 Cystocele, midline: Secondary | ICD-10-CM

## 2017-11-19 NOTE — Addendum Note (Signed)
Addended by: Dayna BarkerGARDNER, KIMBERLY K on: 11/19/2017 11:21 AM   Modules accepted: Orders

## 2017-11-19 NOTE — Progress Notes (Signed)
    Heather Hoover 1945/12/25 [id number]        71 y.o.  [id number] for annual gynecologic exam.  Doing well.  Lost 40 pounds over the past year using weight watchers.  Past medical history,surgical history, problem list, medications, allergies, family history and social history were all reviewed and documented as reviewed in the EPIC chart.  ROS:  Performed with pertinent positives and negatives included in the history, assessment and plan.   Additional significant findings : None   Exam: Kennon PortelaKim Gardner assistant Vitals:   11/19/17 1024  BP: 120/76  Weight: 168 lb (76.2 kg)  Height: 5\' 5"  (1.651 m)   Body mass index is 27.96 kg/m.  General appearance:  Normal affect, orientation and appearance. Skin: Grossly normal HEENT: Without gross lesions.  No cervical or supraclavicular adenopathy. Thyroid normal.  Lungs:  Clear without wheezing, rales or rhonchi Cardiac: RR, without RMG Abdominal:  Soft, nontender, without masses, guarding, rebound, organomegaly or hernia Breasts:  Examined lying and sitting without masses, retractions, discharge or axillary adenopathy. Pelvic:  Ext, BUS, Vagina: With atrophic changes.  Mild cystocele and rectocele.  Cuff well supported.  Adnexa: Without masses or tenderness    Anus and perineum: Normal   Rectovaginal: Normal sphincter tone without palpated masses or tenderness.    Assessment/Plan:  71 y.o. [name] female for annual gynecologic exam status post TVH RSO 1982.   1. Postmenopausal/atrophic genital changes.  Vagifem 10 mcg twice weekly with good results.  Wants to continue.  We have reviewed the absorption risks and she is comfortable with this.  Refill times 1 year provided. 2. 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  Pap smear of vaginal cuff done today.  Continue with annual cytology. 3. Mammography 09/2017.  Continue with annual mammography when due.  SBE monthly reviewed. 4. Cystocele/rectocele.  Mild and asymptomatic to the patient.  Continue with  annual exams to monitor.  Report any symptoms. 5. Colonoscopy 2010.  Repeat at their recommended interval. 6. History of urinary incontinence.  Struggling with this for years and being seen by urology. 7. Osteopenia.  DEXA 2015 T score -1.1.  Options to repeat now versus waiting discussed.  Patient would prefer to wait until next year. 8. Health maintenance.  Congratulations on weight loss given.  No routine lab work done as patient does this elsewhere.  Follow-up 1 year, sooner as needed.   [name] Gurnoor Ursua MD, 11:03 AM 11/19/2017

## 2017-11-19 NOTE — Patient Instructions (Signed)
Follow-up for annual exam in 1 year, sooner if any issues 

## 2017-11-22 LAB — PAP IG W/ RFLX HPV ASCU

## 2017-12-23 ENCOUNTER — Telehealth: Payer: Self-pay | Admitting: Cardiovascular Disease

## 2017-12-23 DIAGNOSIS — E7800 Pure hypercholesterolemia, unspecified: Secondary | ICD-10-CM

## 2017-12-23 DIAGNOSIS — I119 Hypertensive heart disease without heart failure: Secondary | ICD-10-CM

## 2017-12-23 NOTE — Telephone Encounter (Signed)
Discussed with Dr Duke Salviaandolph and ok to get LP/CMET  Left message to call back

## 2017-12-23 NOTE — Telephone Encounter (Signed)
Patient calling, states that she would like to have some blood work completed before her next appt with Dr. Duke Salviaandolph on 02-18-18. Patient mentions that she lost over 40 lbs and would like to know her cholesterol levels, etc.

## 2017-12-23 NOTE — Telephone Encounter (Signed)
Pt of Dr. Duke Salviaandolph  Returned patient call.  As stated in initial msg, pt recounts that she lost 40 lbs since her last visit - I congratulated her on her weight loss achievement. She wanted to see if cholesterol levels could be checked prior to her upcoming March OV, as well as any other labwork Dr. Duke Salviaandolph would recommend. Informed her I would seek review on previsit lab recommendations, and return her call. Pt voiced understanding and thanks.

## 2017-12-24 NOTE — Telephone Encounter (Signed)
Advised patient Lab orders placed in lab folder

## 2018-01-28 ENCOUNTER — Other Ambulatory Visit: Payer: Self-pay | Admitting: Nurse Practitioner

## 2018-01-28 ENCOUNTER — Other Ambulatory Visit: Payer: Self-pay | Admitting: Cardiovascular Disease

## 2018-01-28 NOTE — Telephone Encounter (Signed)
Please review for refill, thanks ! 

## 2018-02-02 ENCOUNTER — Encounter: Payer: Self-pay | Admitting: Family Medicine

## 2018-02-02 ENCOUNTER — Ambulatory Visit: Payer: Medicare Other | Admitting: Family Medicine

## 2018-02-02 ENCOUNTER — Other Ambulatory Visit: Payer: Self-pay

## 2018-02-02 VITALS — BP 128/66 | HR 66 | Temp 98.7°F | Resp 16 | Ht 65.0 in | Wt 160.0 lb

## 2018-02-02 DIAGNOSIS — J069 Acute upper respiratory infection, unspecified: Secondary | ICD-10-CM

## 2018-02-02 DIAGNOSIS — J0100 Acute maxillary sinusitis, unspecified: Secondary | ICD-10-CM

## 2018-02-02 MED ORDER — AZITHROMYCIN 250 MG PO TABS: ORAL_TABLET | ORAL | 0 refills | Status: DC

## 2018-02-02 MED ORDER — HYDROCOD POLST-CPM POLST ER 10-8 MG/5ML PO SUER: 5.0000 mL | Freq: Two times a day (BID) | ORAL | 0 refills | Status: DC | PRN

## 2018-02-02 NOTE — Progress Notes (Signed)
   Subjective:    Patient ID: Heather PasseyPriscilla T Hoover, female    DOB: Apr 22, 1946, 72 y.o.   MRN: [id number]  Patient presents for Illness (x1 week- productive cough, chest congestion, post-tussive vomiting)   Cough with congestion,sinus pressure, drainage  + post nasal drip, most of cough is dry and causes post tussive emesis for the past week. Positive sick contacts with family. No fever  OTC DM medications, benadryl   No tobacco, no asthma/lung issues       Review Of Systems:  GEN- denies fatigue, fever, weight loss,weakness, recent illness HEENT- denies eye drainage, change in vision, nasal discharge, CVS- denies chest pain, palpitations RESP- denies SOB, cough, wheeze ABD- denies N/V, change in stools, abd pain GU- denies dysuria, hematuria, dribbling, incontinence MSK- denies joint pain, muscle aches, injury Neuro- denies headache, dizziness, syncope, seizure activity       Objective:    BP 128/66   Pulse 66   Temp 98.7 F (37.1 C) (Oral)   Resp 16   Ht 5\' 5"  (1.651 m)   Wt 160 lb (72.6 kg)   SpO2 100%   BMI 26.63 kg/m  GEN- NAD, alert and oriented x3 HEENT- PERRL, EOMI, non injected sclera, pink conjunctiva, MMM, oropharynx - phlegm post oropharynx, mild erythema, nares turbinates enlarted, + rhinorrhea, Maxillary sinus tenderness  Neck- Supple, no thyromegaly CVS- RRR, no murmur RESP-CTAB EXT- No edema Pulses- Radial, 2+        Assessment & Plan:      Problem List Items Addressed This Visit    None    Visit Diagnoses    Acute maxillary sinusitis, recurrence not specified    -  Primary   URi now with sinusitis signs, treat wtih zpak, floanse, nasal saline, tussionex given   Relevant Medications   chlorpheniramine-HYDROcodone (TUSSIONEX PENNKINETIC ER) 10-8 MG/5ML SUER   azithromycin (ZITHROMAX) 250 MG tablet   Acute URI       Relevant Medications   azithromycin (ZITHROMAX) 250 MG tablet      Note: This dictation was prepared with Dragon dictation  along with smaller phrase technology. Any transcriptional errors that result from this process are unintentional.

## 2018-02-02 NOTE — Patient Instructions (Signed)
Take antibiotics Tussionex  F/U as needed

## 2018-02-11 LAB — COMPREHENSIVE METABOLIC PANEL
A/G RATIO: 2 (ref 1.2–2.2)
ALT: 16 IU/L (ref 0–32)
AST: 20 IU/L (ref 0–40)
Albumin: 4.4 g/dL (ref 3.5–4.8)
Alkaline Phosphatase: 61 IU/L (ref 39–117)
BILIRUBIN TOTAL: 0.4 mg/dL (ref 0.0–1.2)
BUN/Creatinine Ratio: 23 (ref 12–28)
BUN: 15 mg/dL (ref 8–27)
CALCIUM: 9.3 mg/dL (ref 8.7–10.3)
CHLORIDE: 99 mmol/L (ref 96–106)
CO2: 27 mmol/L (ref 20–29)
Creatinine, Ser: 0.64 mg/dL (ref 0.57–1.00)
GFR, EST AFRICAN AMERICAN: 105 mL/min/{1.73_m2} (ref >59)
GFR, EST NON AFRICAN AMERICAN: 91 mL/min/{1.73_m2} (ref >59)
GLOBULIN, TOTAL: 2.2 g/dL (ref 1.5–4.5)
Glucose: 94 mg/dL (ref 65–99)
POTASSIUM: 4.4 mmol/L (ref 3.5–5.2)
SODIUM: 138 mmol/L (ref 134–144)
TOTAL PROTEIN: 6.6 g/dL (ref 6.0–8.5)

## 2018-02-11 LAB — LIPID PANEL
CHOL/HDL RATIO: 2.3 ratio (ref 0.0–4.4)
CHOLESTEROL TOTAL: 145 mg/dL (ref 100–199)
HDL: 64 mg/dL (ref >39)
LDL CALC: 66 mg/dL (ref 0–99)
TRIGLYCERIDES: 76 mg/dL (ref 0–149)
VLDL Cholesterol Cal: 15 mg/dL (ref 5–40)

## 2018-02-18 ENCOUNTER — Encounter: Payer: Self-pay | Admitting: Cardiovascular Disease

## 2018-02-18 ENCOUNTER — Ambulatory Visit: Payer: Medicare Other | Admitting: Cardiovascular Disease

## 2018-02-18 VITALS — BP 102/70 | HR 57 | Ht 66.0 in | Wt 160.0 lb

## 2018-02-18 DIAGNOSIS — R0602 Shortness of breath: Secondary | ICD-10-CM | POA: Diagnosis not present

## 2018-02-18 DIAGNOSIS — I10 Essential (primary) hypertension: Secondary | ICD-10-CM | POA: Diagnosis not present

## 2018-02-18 DIAGNOSIS — I370 Nonrheumatic pulmonary valve stenosis: Secondary | ICD-10-CM

## 2018-02-18 DIAGNOSIS — E7800 Pure hypercholesterolemia, unspecified: Secondary | ICD-10-CM | POA: Diagnosis not present

## 2018-02-18 MED ORDER — POTASSIUM CHLORIDE ER 10 MEQ PO TBCR: EXTENDED_RELEASE_TABLET | ORAL | 3 refills | Status: DC

## 2018-02-18 NOTE — Patient Instructions (Signed)
Medication Instructions:  DECREASE YOUR HYDROCHLOROTHIAZIDE TO 25 MG 1/2 TABLET DAILY   Labwork: NONE  Testing/Procedures: NONE  Follow-Up: Your physician wants you to follow-up in: 1 YEAR OV You will receive a reminder letter in the mail two months in advance. If you don't receive a letter, please call our office to schedule the follow-up appointment.  Any Other Special Instructions Will Be Listed Below (If Applicable).  CONTINUE TO MONITOR YOUR BLOOD PRESSURE AT HOME AND IF IT REMAINS BELOW 130/80 CALL THE OFFICE FOR NEW PRESCRIPTION OF THE LOWER DOSE OF HYDROCHLOROTHIAZIDE. IF IT DOES NOT REMAIN BELOW 130/80 INCREASE BACK TO FULL TABLET   If you need a refill on your cardiac medications before your next appointment, please call your pharmacy.

## 2018-02-18 NOTE — Progress Notes (Signed)
Cardiology Office Note   Date:  02/18/2018   ID:  Heather, Hoover Jul 11, 1946, MRN [id number]  PCP:  Donita Brooks, MD  Cardiologist:   Chilton Si, MD   Chief Complaint  Patient presents with  . Follow-up    wants to discuss blood pressure meds-weight loss     History of Present Illness: Heather Hoover is a 72 y.o. female with a history of hypertension, hyperlipidemia, and congenital pulmonic stenosis who presents for follow up.  Heather Hoover was previously a patient of Dr. [name].  She had heart catheterizations at ages 74 and 89.  Each time she reportedly had mild pulmonic stenosis.  She saw [name] 65/2017 and reported chest heaviness after the death of her sister.  She also reported increased shortness of breath.  She had an echo 05/19/16 revealed LVEF 55-60% with mild mitral regurgitation, mild aortic regurgitation and mild pulmonary stenosis. The peak gradient is 20 mmHg.    Since her last appointment [name] has lost 50 pounds.  She is participating in [employer].  She exercises intermittently but has been very active.  She had her husband moved to a new home that has 3 levels.  She notes that she is no longer short of breath when going up and down the stairs.  She has not experienced any chest pain or pressure.  She denies lower extremity edema, orthopnea, or PND.  Overall she is feeling very well and her only complaints today are orthopedic.  She brings a log of her blood pressure showing that they have been ranging from the 100s to low 120s.  She denies any lightheadedness or dizziness.  Past Medical History:  Diagnosis Date  . Arthritis   . Bone spur   . DES exposure in utero   . Dyslipidemia   . Exogenous obesity   . Heart murmur   . Hypertension   . 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 Osteopenia 06/2014   T score -1.1 FRAX 8.1%/0.7%    Past Surgical History:  Procedure Laterality Date  .  ABDOMINAL HYSTERECTOMY  02/07/1981   RSO  . APPENDECTOMY    . BREAST SURGERY    . CARDIAC CATHETERIZATION     when in 3rd grade and age 4  . childbirth     x 2 vaginal deliveries  . COSMETIC SURGERY    . forehead plastic surgery     MOH's surgery  . FRACTURE SURGERY    . KNEE ARTHROSCOPY     right  . OOPHORECTOMY     RSO     Current Outpatient Medications  Medication Sig Dispense Refill  . Cholecalciferol (VITAMIN D-3 PO) Take 2,000 Units by mouth daily. Taking 2000 daily    . ciclopirox (LOPROX) 0.77 % cream Apply topically daily.    . diclofenac (VOLTAREN) 75 MG EC tablet Take 75 mg by mouth 2 (two) times daily as needed.    . Estradiol (VAGIFEM) 10 MCG TABS vaginal tablet INSERT ONE TABLET IN THE VAGINA VAGINALLY TWICE WEEKLY 24 tablet 4  . Fexofenadine HCl (ALLEGRA PO) Take by mouth daily as needed.    . fluticasone (FLONASE) 50 MCG/ACT nasal spray USE 2 SPRAYS IN BOTH NOSTRILS EVERY DAY 16 g 11  . hydrochlorothiazide (HYDRODIURIL) 25 MG tablet Take 25 mg by mouth as directed. 1/2 TABLET BY MOUTH DAILY    . potassium chloride (K-DUR) 10 MEQ tablet TAKE 1 TABLET(10 MEQ) BY MOUTH  DAILY 90 tablet 3  . ranitidine (ZANTAC) 75 MG tablet Take 75 mg by mouth daily.    . rosuvastatin (CRESTOR) 20 MG tablet TAKE 1 TABLET(20 MG) BY MOUTH DAILY 90 tablet 3  . carvedilol (COREG) 12.5 MG tablet Take 1 tablet (12.5 mg total) by mouth 2 (two) times daily. 180 tablet 3   No current facility-administered medications for this visit.     Allergies:   Augmentin [amoxicillin-pot clavulanate] and Niacin-lovastatin er    Social History:  The patient  reports that  has never smoked. she has never used smokeless tobacco. She reports that she drinks alcohol. She reports that she does not use drugs.   Family History:  The patient's family history includes Arthritis in her father; Breast cancer (age of onset: 2565) in her sister; Breast cancer (age of onset: 3380) in her mother; Cancer in her mother;  Cancer (age of onset: 7455) in her sister; Cancer (age of onset: 7478) in her father; Heart failure in her sister; Hypertension in her father, mother, and sister; Muscular dystrophy in her brother.    ROS:  Please see the history of present illness.   Otherwise, review of systems are positive for none.   All other systems are reviewed and negative.    PHYSICAL EXAM: VS:  BP 102/70   Pulse (!) 57   Ht 5\' 6"  (1.676 m)   Wt 160 lb (72.6 kg)   BMI 25.82 kg/m  , BMI Body mass index is 25.82 kg/m. GENERAL:  Well appearing HEENT: Pupils equal round and reactive, fundi not visualized, oral mucosa unremarkable NECK:  No jugular venous distention, waveform within normal limits, carotid upstroke brisk and symmetric, no bruits LUNGS:  Clear to auscultation bilaterally HEART:  RRR.  PMI not displaced or sustained,S1 and split S2 within normal limits, no S3, no S4, no clicks, no rubs, II/VI systolic murmur at the LUSB ABD:  Flat, positive bowel sounds normal in frequency in pitch, no bruits, no rebound, no guarding, no midline pulsatile mass, no hepatomegaly, no splenomegaly EXT:  2 plus pulses throughout, no edema, no cyanosis no clubbing SKIN:  No rashes no nodules NEURO:  Cranial nerves II through XII grossly intact, motor grossly intact throughout PSYCH:  Cognitively intact, oriented to person place and time   EKG:  EKG is ordered today. The ekg ordered 10/26/16 demonstrates sinus rhythm rate 77 bpm. 02/18/18: Sinus bradycardia.  Rate 57 bpm.   Recent Labs: 04/13/2017: Hemoglobin 13.3; Platelets 194 02/11/2018: ALT 16; BUN 15; Creatinine, Ser 0.64; Potassium 4.4; Sodium 138    Lipid Panel    Component Value Date/Time   CHOL 145 02/11/2018 0853   TRIG 76 02/11/2018 0853   HDL 64 02/11/2018 0853   CHOLHDL 2.3 02/11/2018 0853   CHOLHDL 2.5 04/13/2017 0820   VLDL 22 04/13/2017 0820   LDLCALC 66 02/11/2018 0853      Wt Readings from Last 3 Encounters:  02/18/18 160 lb (72.6 kg)  02/02/18  160 lb (72.6 kg)  11/19/17 168 lb (76.2 kg)      ASSESSMENT AND PLAN:  # Hypertension:  Blood pressure is well-controlled and has been low at times.  This may be 2/2 weight loss.  She will reduce Hydrocort thiazide to 12.5 mg daily.  If her blood pressure remains less than 130/80 we will continue this dose.  If it is averaging above that she will increase it back to 25 mg.  Continue carvedilol.  # Mild pulmonic stenosis:  Stable on  echo 05/19/16 revealed a peak gradient of 20 mmHg. She is euvolemic.  # Hyperlipidemia: Continue rosuvastatin.  LDL 66 on 02/11/18.   Current medicines are reviewed at length with the patient today.  The patient does not have concerns regarding medicines.  The following changes have been made:  Reduce HCTZ  Labs/ tests ordered today include:   Orders Placed This Encounter  Procedures  . EKG 12-Lead     Disposition:   FU with Saveah Bahar C. Duke Salvia, MD, [facility] in 1 year.    This note was written with the assistance of speech recognition software.  Please excuse any transcriptional errors.  Signed, Kinlie Janice C. Duke Salvia, MD, [facility]  02/18/2018 11:21 AM    [facility]

## 2018-03-14 ENCOUNTER — Other Ambulatory Visit: Payer: Self-pay | Admitting: Cardiovascular Disease

## 2018-03-15 ENCOUNTER — Other Ambulatory Visit: Payer: Self-pay | Admitting: Gynecology

## 2018-03-15 NOTE — Telephone Encounter (Signed)
Refill Request.  

## 2018-03-15 NOTE — Telephone Encounter (Signed)
Rx has been sent to the pharmacy electronically. ° °

## 2018-04-25 ENCOUNTER — Other Ambulatory Visit: Payer: Self-pay | Admitting: *Deleted

## 2018-04-25 MED ORDER — ROSUVASTATIN CALCIUM 20 MG PO TABS: 20.0000 mg | ORAL_TABLET | Freq: Every day | ORAL | 3 refills | Status: DC

## 2018-04-25 NOTE — Telephone Encounter (Signed)
REFILL 

## 2018-06-04 ENCOUNTER — Other Ambulatory Visit: Payer: Self-pay | Admitting: Family Medicine

## 2018-06-28 ENCOUNTER — Encounter: Payer: Self-pay | Admitting: Family Medicine

## 2018-06-28 ENCOUNTER — Ambulatory Visit (INDEPENDENT_AMBULATORY_CARE_PROVIDER_SITE_OTHER): Payer: Medicare Other | Admitting: Family Medicine

## 2018-06-28 VITALS — BP 118/70 | HR 58 | Temp 98.1°F | Resp 16 | Ht 65.0 in | Wt 151.0 lb

## 2018-06-28 DIAGNOSIS — E785 Hyperlipidemia, unspecified: Secondary | ICD-10-CM

## 2018-06-28 DIAGNOSIS — E7800 Pure hypercholesterolemia, unspecified: Secondary | ICD-10-CM

## 2018-06-28 DIAGNOSIS — I119 Hypertensive heart disease without heart failure: Secondary | ICD-10-CM | POA: Diagnosis not present

## 2018-06-28 DIAGNOSIS — Q221 Congenital pulmonary valve stenosis: Secondary | ICD-10-CM

## 2018-06-28 DIAGNOSIS — Z0000 Encounter for general adult medical examination without abnormal findings: Secondary | ICD-10-CM | POA: Diagnosis not present

## 2018-06-28 LAB — CBC WITH DIFFERENTIAL/PLATELET
BASOS PCT: 0.6 %
Basophils Absolute: 28 cells/uL (ref 0–200)
EOS PCT: 1.7 %
Eosinophils Absolute: 80 cells/uL (ref 15–500)
HCT: 38.3 % (ref 35.0–45.0)
Hemoglobin: 12.9 g/dL (ref 11.7–15.5)
Lymphs Abs: 855 cells/uL (ref 850–3900)
MCH: 31.9 pg (ref 27.0–33.0)
MCHC: 33.7 g/dL (ref 32.0–36.0)
MCV: 94.6 fL (ref 80.0–100.0)
MONOS PCT: 7.8 %
MPV: 10.3 fL (ref 7.5–12.5)
NEUTROS PCT: 71.7 %
Neutro Abs: 3370 cells/uL (ref 1500–7800)
PLATELETS: 178 10*3/uL (ref 140–400)
RBC: 4.05 10*6/uL (ref 3.80–5.10)
RDW: 11.4 % (ref 11.0–15.0)
TOTAL LYMPHOCYTE: 18.2 %
WBC mixed population: 367 cells/uL (ref 200–950)
WBC: 4.7 10*3/uL (ref 3.8–10.8)

## 2018-06-28 NOTE — Progress Notes (Signed)
Subjective:    Patient ID: Heather Hoover, female    DOB: 04-Jul-1946, 72 y.o.   MRN: [id number]  HPI Patient is here today for complete physical exam.  She is due for colonoscopy next year.  Patient has a history of a hysterectomy and therefore does not require Pap smears although she still sees gynecology.  Her mammogram is due in October.  She is also due for a bone density in October as she has a history of osteopenia and her last bone density was in 2015.  She is not taking calcium but she is taking vitamin D.  Her most recent lab work was checked in February and was normal.  She is due for a CBC.  Otherwise she is doing well with no concerns.  Immunizations are up-to-date except for the new shingles vaccine, Shingrix Immunization History  Administered Date(s) Administered  . Influenza, High Dose Seasonal PF 09/17/2017  . Influenza-Unspecified 09/30/2011, 10/22/2015, 09/17/2017  . Pneumococcal Conjugate-13 05/21/2014  . Pneumococcal Polysaccharide-23 04/06/2017  . Td 06/06/2012  . Tdap 06/06/2012  . Zoster 10/09/2013    Past Medical History:  Diagnosis Date  . Arthritis   . Bone spur   . DES exposure in utero   . Dyslipidemia   . Exogenous obesity   . Heart murmur   . Hypertension   . 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 Osteopenia 06/2014   T score -1.1 FRAX 8.1%/0.7%   Past Surgical History:  Procedure Laterality Date  . ABDOMINAL HYSTERECTOMY  02/07/1981   RSO  . APPENDECTOMY    . BREAST SURGERY    . CARDIAC CATHETERIZATION     when in 3rd grade and age 89  . childbirth     x 2 vaginal deliveries  . COSMETIC SURGERY    . forehead plastic surgery     MOH's surgery  . FRACTURE SURGERY    . KNEE ARTHROSCOPY     right  . OOPHORECTOMY     RSO   Current Outpatient Medications on File Prior to Visit  Medication Sig Dispense Refill  . carvedilol (COREG) 12.5 MG tablet TAKE 1 TABLET(12.5 MG) BY MOUTH TWICE DAILY 180 tablet 3  .  Cholecalciferol (VITAMIN D-3 PO) Take 2,000 Units by mouth daily. Taking 2000 daily    . ciclopirox (LOPROX) 0.77 % cream Apply topically daily.    . diclofenac (VOLTAREN) 75 MG EC tablet TAKE 1 TABLET(75 MG) BY MOUTH TWICE DAILY 60 tablet 0  . Fexofenadine HCl (ALLEGRA PO) Take by mouth daily as needed.    . fluticasone (FLONASE) 50 MCG/ACT nasal spray USE 2 SPRAYS IN BOTH NOSTRILS EVERY DAY 16 g 11  . hydrochlorothiazide (HYDRODIURIL) 25 MG tablet Take 25 mg by mouth as directed. 1/2 TABLET BY MOUTH DAILY    . potassium chloride (K-DUR) 10 MEQ tablet TAKE 1 TABLET(10 MEQ) BY MOUTH DAILY 90 tablet 3  . ranitidine (ZANTAC) 75 MG tablet Take 75 mg by mouth daily.    . rosuvastatin (CRESTOR) 20 MG tablet Take 1 tablet (20 mg total) by mouth daily. 90 tablet 3  . YUVAFEM 10 MCG TABS vaginal tablet INSERT 1 TABLET VAGINALLY TWICE WEEKLY 24 tablet 1   No current facility-administered medications on file prior to visit.    Allergies  Allergen Reactions  . Augmentin [Amoxicillin-Pot Clavulanate] Diarrhea  . Niacin-Lovastatin Er     Flushing,itching,syncope   Social History   Socioeconomic History  . Marital status: Married  Spouse name: Not on file  . Number of children: Not on file  . Years of education: Not on file  . Highest education level: Not on file  Occupational History  . Not on file  Social Needs  . Financial resource strain: Not on file  . Food insecurity:    Worry: Not on file    Inability: Not on file  . Transportation needs:    Medical: Not on file    Non-medical: Not on file  Tobacco Use  . Smoking status: Never Smoker  . Smokeless tobacco: Never Used  Substance and Sexual Activity  . Alcohol use: Yes    Comment: rarely  . Drug use: No  . Sexual activity: Not Currently    Birth control/protection: Post-menopausal    Comment: 1st intercourse 61 yo-1 partner  Lifestyle  . Physical activity:    Days per week: Not on file    Minutes per session: Not on file  .  Stress: Not on file  Relationships  . Social connections:    Talks on phone: Not on file    Gets together: Not on file    Attends religious service: Not on file    Active member of club or organization: Not on file    Attends meetings of clubs or organizations: Not on file    Relationship status: Not on file  . Intimate partner violence:    Fear of current or ex partner: Not on file    Emotionally abused: Not on file    Physically abused: Not on file    Forced sexual activity: Not on file  Other Topics Concern  . Not on file  Social History Narrative  . Not on file   Family History  Problem Relation Age of Onset  . Hypertension Mother   . Breast cancer Mother 34       Breast   . Cancer Mother        uterine thye think  . Cancer Father 75       prostate metastized to bone  . Hypertension Father   . Arthritis Father   . Hypertension Sister   . Breast cancer Sister 26  . Cancer Sister 64       uterine  . Heart failure Sister   . Muscular dystrophy Brother       Review of Systems  All other systems reviewed and are negative.      Objective:   Physical Exam  Constitutional: She is oriented to person, place, and time. She appears well-developed and well-nourished. No distress.  HENT:  Head: Normocephalic and atraumatic.  Right Ear: External ear normal.  Left Ear: External ear normal.  Nose: Nose normal.  Mouth/Throat: Oropharynx is clear and moist. No oropharyngeal exudate.  Eyes: Pupils are equal, round, and reactive to light. Conjunctivae and EOM are normal. Right eye exhibits no discharge. Left eye exhibits no discharge. No scleral icterus.  Neck: Normal range of motion. Neck supple. No JVD present. No tracheal deviation present. No thyromegaly present.  Cardiovascular: Normal rate, regular rhythm and intact distal pulses. Exam reveals no gallop and no friction rub.  Murmur heard. Pulmonary/Chest: Effort normal and breath sounds normal. No stridor. No  respiratory distress. She has no wheezes. She has no rales. She exhibits no tenderness.  Abdominal: Soft. Bowel sounds are normal. She exhibits no distension and no mass. There is no tenderness. There is no rebound and no guarding.  Musculoskeletal: Normal range of motion. She exhibits no  edema or tenderness.  Lymphadenopathy:    She has no cervical adenopathy.  Neurological: She is alert and oriented to person, place, and time. She has normal reflexes. No cranial nerve deficit. She exhibits normal muscle tone. Coordination normal.  Skin: Skin is warm. No rash noted. She is not diaphoretic. No erythema. No pallor.  Psychiatric: She has a normal mood and affect. Her behavior is normal. Judgment and thought content normal.  Vitals reviewed.         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  She is asymptomatic and denies any shortness of breath or dyspnea on exertion.  There is no evidence for right-sided heart failure.  Cholesterol was checked in February and was outstanding as well as her liver function test and renal function.  Blood sugar was normal.  I will check a CBC.  Blood pressure is well controlled today.  Immunizations are up-to-date.  I did recommend Shingrix.  I recommended a mammogram this fall as well as a bone density test.  She receives these through her gynecologist.

## 2018-06-30 ENCOUNTER — Encounter (INDEPENDENT_AMBULATORY_CARE_PROVIDER_SITE_OTHER): Payer: Self-pay

## 2018-08-01 ENCOUNTER — Other Ambulatory Visit: Payer: Self-pay | Admitting: Family Medicine

## 2018-09-10 ENCOUNTER — Other Ambulatory Visit: Payer: Self-pay | Admitting: Gynecology

## 2018-09-12 NOTE — Telephone Encounter (Signed)
Annual scheduled on 11/28/18

## 2018-09-27 MED ORDER — HYDROCHLOROTHIAZIDE 12.5 MG PO TABS: 12.5000 mg | ORAL_TABLET | Freq: Every day | ORAL | 3 refills | Status: DC

## 2018-09-29 LAB — HM MAMMOGRAPHY

## 2018-10-03 ENCOUNTER — Encounter: Payer: Self-pay | Admitting: *Deleted

## 2018-11-28 ENCOUNTER — Ambulatory Visit: Payer: Medicare Other | Admitting: Gynecology

## 2018-11-28 ENCOUNTER — Encounter: Payer: Self-pay | Admitting: Gynecology

## 2018-11-28 VITALS — BP 118/78 | Ht 65.0 in | Wt 153.0 lb

## 2018-11-28 DIAGNOSIS — Z01419 Encounter for gynecological examination (general) (routine) without abnormal findings: Secondary | ICD-10-CM | POA: Diagnosis not present

## 2018-11-28 DIAGNOSIS — N952 Postmenopausal atrophic vaginitis: Secondary | ICD-10-CM | POA: Diagnosis not present

## 2018-11-28 DIAGNOSIS — Z9189 Other specified personal risk factors, not elsewhere classified: Secondary | ICD-10-CM

## 2018-11-28 DIAGNOSIS — M8580 Other specified disorders of bone density and structure, unspecified site: Secondary | ICD-10-CM | POA: Diagnosis not present

## 2018-11-28 MED ORDER — ESTRADIOL 10 MCG VA TABS: ORAL_TABLET | VAGINAL | 4 refills | Status: DC

## 2018-11-28 NOTE — Patient Instructions (Signed)
Follow-up for the bone density as scheduled. 

## 2018-11-28 NOTE — Progress Notes (Signed)
    Heather Hoover [birth year] [id number]        72 y.o.  [id number] for annual gynecologic exam.  Without gynecologic complaints  Past medical history,surgical history, problem list, medications, allergies, family history and social history were all reviewed and documented as reviewed in the EPIC chart.  ROS:  Performed with pertinent positives and negatives included in the history, assessment and plan.   Additional significant findings : None   Exam: Bari MantisKim Alexis assistant Vitals:   11/28/18 1452  BP: 118/78  Weight: 153 lb (69.4 kg)  Height: 5\' 5"  (1.651 m)   Body mass index is 25.46 kg/m.  General appearance:  Normal affect, orientation and appearance. Skin: Grossly normal HEENT: Without gross lesions.  No cervical or supraclavicular adenopathy. Thyroid normal.  Lungs:  Clear without wheezing, rales or rhonchi Cardiac: RR, without RMG Abdominal:  Soft, nontender, without masses, guarding, rebound, organomegaly or hernia Breasts:  Examined lying and sitting without masses, retractions, discharge or axillary adenopathy. Pelvic:  Ext, BUS, Vagina: With atrophic changes.  Mild cystocele and rectocele.  Cuff well supported.  Adnexa: Without masses or tenderness    Anus and perineum: Normal   Rectovaginal: Normal sphincter tone without palpated masses or tenderness.    Assessment/Plan:  [name] female for annual gynecologic exam.   1. Postmenopausal/atrophic genital changes.  Status post TVH RSO 1982.  Uses Vagifem twice weekly with good results.  We have reviewed the issues of absorption and risks.  Patient is comfortable continuing.  Refill x1 year. 2. 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  Pap smear of vaginal cuff done today. 3. Mammography 09/2018.  Continue with annual mammography next year.  Breast exam normal today. 4. Mild cystocele and rectocele.  Cuff well supported.  Stable on serial exams asymptomatic to the patient. 5. Colonoscopy coming due and I reminded her  to schedule this. 6. Osteopenia.  DEXA 2015 T score -1.1.  Reports that her primary physician would like to repeat the bone density and will schedule here as this is where her prior DEXA was done. 7. Health maintenance.  No routine lab work done as patient does this elsewhere.  Follow-up 1 year, sooner as needed.   [name] [name] MD, 3:13 PM 11/28/2018

## 2018-11-28 NOTE — Addendum Note (Signed)
Addended by: Tito DineBONHAM, KIM A on: 11/28/2018 03:27 PM   Modules accepted: Orders

## 2018-11-29 LAB — PAP IG W/ RFLX HPV ASCU

## 2018-12-26 ENCOUNTER — Ambulatory Visit (INDEPENDENT_AMBULATORY_CARE_PROVIDER_SITE_OTHER): Payer: Medicare Other

## 2018-12-26 ENCOUNTER — Other Ambulatory Visit: Payer: Self-pay | Admitting: Gynecology

## 2018-12-26 DIAGNOSIS — M8589 Other specified disorders of bone density and structure, multiple sites: Secondary | ICD-10-CM | POA: Diagnosis not present

## 2018-12-26 DIAGNOSIS — Z780 Asymptomatic menopausal state: Secondary | ICD-10-CM

## 2018-12-26 DIAGNOSIS — M8580 Other specified disorders of bone density and structure, unspecified site: Secondary | ICD-10-CM

## 2018-12-28 ENCOUNTER — Encounter: Payer: Self-pay | Admitting: Gynecology

## 2019-02-11 ENCOUNTER — Emergency Department (HOSPITAL_COMMUNITY)
Admission: EM | Admit: 2019-02-11 | Discharge: 2019-02-11 | Disposition: A | Discharge status: Home / Self Care | Payer: Medicare Other | Attending: Emergency Medicine | Admitting: Emergency Medicine

## 2019-02-11 ENCOUNTER — Emergency Department (HOSPITAL_COMMUNITY): Payer: Medicare Other

## 2019-02-11 ENCOUNTER — Other Ambulatory Visit: Payer: Self-pay

## 2019-02-11 DIAGNOSIS — Y9301 Activity, walking, marching and hiking: Secondary | ICD-10-CM | POA: Insufficient documentation

## 2019-02-11 DIAGNOSIS — S0181XA Laceration without foreign body of other part of head, initial encounter: Secondary | ICD-10-CM | POA: Diagnosis not present

## 2019-02-11 DIAGNOSIS — Z79899 Other long term (current) drug therapy: Secondary | ICD-10-CM | POA: Diagnosis not present

## 2019-02-11 DIAGNOSIS — Y92009 Unspecified place in unspecified non-institutional (private) residence as the place of occurrence of the external cause: Secondary | ICD-10-CM | POA: Insufficient documentation

## 2019-02-11 DIAGNOSIS — Y999 Unspecified external cause status: Secondary | ICD-10-CM | POA: Insufficient documentation

## 2019-02-11 DIAGNOSIS — W0110XA Fall on same level from slipping, tripping and stumbling with subsequent striking against unspecified object, initial encounter: Secondary | ICD-10-CM | POA: Diagnosis not present

## 2019-02-11 DIAGNOSIS — I10 Essential (primary) hypertension: Secondary | ICD-10-CM | POA: Diagnosis not present

## 2019-02-11 DIAGNOSIS — S0993XA Unspecified injury of face, initial encounter: Secondary | ICD-10-CM | POA: Diagnosis present

## 2019-02-11 DIAGNOSIS — Z23 Encounter for immunization: Secondary | ICD-10-CM | POA: Insufficient documentation

## 2019-02-11 MED ORDER — IBUPROFEN 200 MG PO TABS: 600.0000 mg | ORAL_TABLET | Freq: Once | ORAL | Status: DC

## 2019-02-11 MED ORDER — LIDOCAINE-EPINEPHRINE (PF) 2 %-1:200000 IJ SOLN
20.0000 mL | Freq: Once | INTRAMUSCULAR | Status: AC
  Administered 2019-02-11: 20 mL
  Filled 2019-02-11: qty 20

## 2019-02-11 MED ORDER — TETANUS-DIPHTH-ACELL PERTUSSIS 5-2.5-18.5 LF-MCG/0.5 IM SUSP
0.5000 mL | Freq: Once | INTRAMUSCULAR | Status: AC
  Administered 2019-02-11: 0.5 mL via INTRAMUSCULAR
  Filled 2019-02-11: qty 0.5

## 2019-02-11 MED ORDER — ACETAMINOPHEN 325 MG PO TABS
650.0000 mg | ORAL_TABLET | Freq: Once | ORAL | Status: AC
  Administered 2019-02-11: 650 mg via ORAL
  Filled 2019-02-11: qty 2

## 2019-02-11 NOTE — Discharge Instructions (Addendum)
Suture removal in 6 days  

## 2019-02-11 NOTE — ED Notes (Signed)
Patient transported to CT 

## 2019-02-11 NOTE — ED Notes (Signed)
Patient returned from CT

## 2019-02-11 NOTE — ED Provider Notes (Signed)
[facility]-EMERGENCY DEPT Provider Note   CSN: [id number] Arrival date & time: 02/11/19  0720    History   Chief Complaint Chief Complaint  Patient presents with  . Fall    HPI Heather Hoover is a 73 y.o. female.     HPI Patient is a 73 year old female who fell and slipped last night while walking in the bathroom.  She fell forward.  She struck her chin.  She has a laceration overlying her chin.  She also complains of left jaw pain and mild neck pain.  She reports malocclusion without significant trismus.  Pain is mild to moderate in severity.  Tetanus is not up-to-date.   Past Medical History:  Diagnosis Date  . Arthritis   . Bone spur   . DES exposure in utero   . Dyslipidemia   . Exogenous obesity   . Heart murmur   . Hypertension   . 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 Osteopenia 12/2018   T score -1.6 FRAX 10% / 1.5%    Patient Active Problem List   Diagnosis Date Noted  . 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 Exogenous obesity   . Dyslipidemia   . Arthritis   . 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 Hypercholesterolemia 08/17/2011  . Benign hypertensive heart disease without heart failure 08/17/2011    Past Surgical History:  Procedure Laterality Date  . ABDOMINAL HYSTERECTOMY  02/07/1981   RSO  . APPENDECTOMY    . BREAST SURGERY    . CARDIAC CATHETERIZATION     when in 3rd grade and age 82  . childbirth     x 2 vaginal deliveries  . COSMETIC SURGERY    . forehead plastic surgery     MOH's surgery  . FRACTURE SURGERY    . KNEE ARTHROSCOPY     right  . OOPHORECTOMY     RSO     OB History    Gravida  2   Para  2   Term  2   Preterm      AB  0   Living  2     SAB      TAB      Ectopic  0   Multiple      Live Births               Home Medications    Prior to Admission medications   Medication Sig Start Date End Date Taking? Authorizing  Provider  carvedilol (COREG) 12.5 MG tablet TAKE 1 TABLET(12.5 MG) BY MOUTH TWICE DAILY 03/15/18   Chilton Si, MD  Cholecalciferol (VITAMIN D-3 PO) Take 2,000 Units by mouth daily. Taking 2000 daily    [provider]  ciclopirox (LOPROX) 0.77 % cream Apply topically daily.    [provider]  diclofenac (VOLTAREN) 75 MG EC tablet TAKE 1 TABLET(75 MG) BY MOUTH TWICE DAILY 08/01/18   Donita Brooks, MD  Estradiol 10 MCG TABS vaginal tablet INSERT 1 TABLET VAGINALLY TWICE WEEKLY 11/28/18   Fontaine, Nadyne Coombes, MD  Fexofenadine HCl (ALLEGRA PO) Take by mouth daily as needed.    [provider]  fluticasone (FLONASE) 50 MCG/ACT nasal spray USE 2 SPRAYS IN BOTH NOSTRILS EVERY DAY 03/23/16   Donita Brooks, MD  hydrochlorothiazide (HYDRODIURIL) 12.5 MG tablet Take 1 tablet (12.5 mg total) by mouth daily. 09/27/18   Chilton Si, MD  potassium chloride (K-DUR) 10 MEQ tablet  TAKE 1 TABLET(10 MEQ) BY MOUTH DAILY 02/18/18   Chilton Si, MD  ranitidine (ZANTAC) 75 MG tablet Take 75 mg by mouth daily.    [provider]  rosuvastatin (CRESTOR) 20 MG tablet Take 1 tablet (20 mg total) by mouth daily. 04/25/18   Chilton Si, MD    Family History Family History  Problem Relation Age of Onset  . Hypertension Mother   . Breast cancer Mother 56       Breast   . Cancer Mother        uterine thye think  . Cancer Father 49       prostate metastized to bone  . Hypertension Father   . Arthritis Father   . Hypertension Sister   . Breast cancer Sister 64  . Cancer Sister 15       uterine  . Heart failure Sister   . Muscular dystrophy Brother     Social History Social History   Tobacco Use  . Smoking status: Never Smoker  . Smokeless tobacco: Never Used  Substance Use Topics  . Alcohol use: Yes    Comment: rarely  . Drug use: No     Allergies   Augmentin [amoxicillin-pot clavulanate] and Niacin-lovastatin er   Review of Systems Review  of Systems  All other systems reviewed and are negative.    Physical Exam Updated Vital Signs BP (!) 141/79 (BP Location: Left Arm)   Pulse 69   Temp 98.9 F (37.2 C) (Oral)   Resp 17   Ht  (1.676 m)   Wt 67.1 kg   SpO2 100%   BMI 23.89 kg/m   Physical Exam Vitals signs and nursing note reviewed.  Constitutional:      Appearance: She is well-developed.  HENT:     Head: Normocephalic.     Comments: Laceration overlying her chin.  No obvious dental injury.  Mild reported malocclusion.  No trismus.  No open mandible fracture visible intraorally.  Mild tenderness to the angle of the left mandible without deformity or significant swelling.  No tenderness over the TMJs bilaterally. Neck:     Musculoskeletal: Neck supple. Muscular tenderness present.  Cardiovascular:     Rate and Rhythm: Normal rate.  Pulmonary:     Effort: Pulmonary effort is normal.  Abdominal:     General: There is no distension.     Tenderness: There is no abdominal tenderness.  Musculoskeletal: Normal range of motion.  Neurological:     Mental Status: She is alert and oriented to person, place, and time.      ED Treatments / Results  Labs (all labs ordered are listed, but only abnormal results are displayed) Labs Reviewed - No data to display  EKG None  Radiology Ct Maxillofacial Wo Contrast  Result Date: 02/11/2019 CLINICAL DATA:  Left mandible pain after fall last night. EXAM: CT MAXILLOFACIAL WITHOUT CONTRAST TECHNIQUE: Multidetector CT imaging of the maxillofacial structures was performed. Multiplanar CT image reconstructions were also generated. COMPARISON:  None. FINDINGS: Osseous: No fracture or mandibular dislocation. No destructive process. Orbits: Negative. No traumatic or inflammatory finding. Sinuses: Clear. Soft tissues: Small chin laceration. Limited intracranial: No significant or unexpected finding. IMPRESSION: 1. Small chin laceration.  No acute maxillofacial fracture.  Electronically Signed   By: Obie Dredge M.D.   On: 02/11/2019 09:07    Procedures .Marland KitchenLaceration Repair Performed by: Azalia Bilis, MD Authorized by: Azalia Bilis, MD   Consent:    Consent obtained:  Verbal  Consent given by:  Patient Anesthesia (see MAR for exact dosages):    Anesthesia method:  Local infiltration   Local anesthetic:  Lidocaine 2% WITH epi Laceration details:    Location:  Face   Face location:  Chin   Length (cm):  3.6 Repair type:    Repair type:  Intermediate Pre-procedure details:    Preparation:  Patient was prepped and draped in usual sterile fashion and imaging obtained to evaluate for foreign bodies Exploration:    Wound extent: no nerve damage noted     Contaminated: no   Treatment:    Area cleansed with:  Betadine   Amount of cleaning:  Standard   Irrigation method:  Syringe   Visualized foreign bodies/material removed: no   Subcutaneous repair:    Suture size:  4-0   Suture material:  Vicryl   Suture technique:  Simple interrupted   Number of sutures:  3 Skin repair:    Repair method:  Sutures   Suture size:  5-0   Suture material:  Prolene   Number of sutures:  7 Post-procedure details:    Dressing:  Antibiotic ointment   Patient tolerance of procedure:  Tolerated well, no immediate complications   (including critical care time)  Medications Ordered in ED Medications  Tdap (BOOSTRIX) injection 0.5 mL (0.5 mLs Intramuscular Given 02/11/19 0754)  acetaminophen (TYLENOL) tablet 650 mg (650 mg Oral Given 02/11/19 0753)  lidocaine-EPINEPHrine (XYLOCAINE W/EPI) 2 %-1:200000 (PF) injection 20 mL (20 mLs Infiltration Given 02/11/19 0754)     Initial Impression / Assessment and Plan / ED Course  I have reviewed the triage vital signs and the nursing notes.  Pertinent labs & imaging results that were available during my care of the patient were reviewed by me and considered in my medical decision making (see chart for details).         The patient is overall well-appearing.  Bleeding controlled.  Infection warnings given.  Laceration repaired.  Imaging negative.  Suture removal in 7 days.   Final Clinical Impressions(s) / ED Diagnoses   Final diagnoses:  None    ED Discharge Orders    None       Azalia Bilis, MD 02/12/19 [phone]

## 2019-02-11 NOTE — ED Triage Notes (Signed)
Patient reports fall walking out of her bathroom last night. Landed on knees, then hit chin on floor. Has open wound to chin, reports pain to left side of face.

## 2019-02-17 ENCOUNTER — Encounter: Payer: Self-pay | Admitting: Family Medicine

## 2019-02-17 ENCOUNTER — Ambulatory Visit (INDEPENDENT_AMBULATORY_CARE_PROVIDER_SITE_OTHER): Payer: Medicare Other | Admitting: Family Medicine

## 2019-02-17 VITALS — BP 128/74 | HR 58 | Temp 97.8°F | Resp 16 | Ht 65.0 in | Wt 152.0 lb

## 2019-02-17 DIAGNOSIS — Z4802 Encounter for removal of sutures: Secondary | ICD-10-CM | POA: Diagnosis not present

## 2019-02-17 NOTE — Progress Notes (Signed)
Subjective:    Patient ID: Heather Hoover, female    DOB: [birth year], 73 y.o.   MRN: [id number]  HPI Patient fell on February 22 and was seen in the emergency room.  She sustained a laceration to her chin that was closed with a running suture with 9 passes here today for suture removal.  No evidence of cellulitis.  There is some slight bruising on her chin.  She also has some mild pain in her left TMJ area with chewing however it is 80% better simply using ibuprofen.  She denies any headache or dizziness or signs of postconcussive syndrome Past Medical History:  Diagnosis Date  . Arthritis   . Bone spur   . DES exposure in utero   . Dyslipidemia   . Exogenous obesity   . Heart murmur   . Hypertension   . 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 Osteopenia 12/2018   T score -1.6 FRAX 10% / 1.5%   Past Surgical History:  Procedure Laterality Date  . ABDOMINAL HYSTERECTOMY  02/07/1981   RSO  . APPENDECTOMY    . BREAST SURGERY    . CARDIAC CATHETERIZATION     when in 3rd grade and age 39  . childbirth     x 2 vaginal deliveries  . COSMETIC SURGERY    . forehead plastic surgery     MOH's surgery  . FRACTURE SURGERY    . KNEE ARTHROSCOPY     right  . OOPHORECTOMY     RSO   Current Outpatient Medications on File Prior to Visit  Medication Sig Dispense Refill  . acetaminophen (TYLENOL) 325 MG tablet Take 650 mg by mouth every 6 (six) hours as needed for mild pain or headache.    . carvedilol (COREG) 12.5 MG tablet TAKE 1 TABLET(12.5 MG) BY MOUTH TWICE DAILY 180 tablet 3  . Cholecalciferol (VITAMIN D-3 PO) Take 2,000 Units by mouth daily. Taking 2000 daily    . ciclopirox (LOPROX) 0.77 % cream Apply 1 application topically daily as needed (athletes foot).     Marland Kitchen diclofenac (VOLTAREN) 75 MG EC tablet TAKE 1 TABLET(75 MG) BY MOUTH TWICE DAILY 60 tablet 5  . Estradiol 10 MCG TABS vaginal tablet INSERT 1 TABLET VAGINALLY TWICE WEEKLY 24 tablet 4  .  Fexofenadine HCl (ALLEGRA PO) Take by mouth daily as needed.    . fluticasone (FLONASE) 50 MCG/ACT nasal spray USE 2 SPRAYS IN BOTH NOSTRILS EVERY DAY 16 g 11  . hydrochlorothiazide (HYDRODIURIL) 12.5 MG tablet Take 1 tablet (12.5 mg total) by mouth daily. 90 tablet 3  . polyvinyl alcohol (LIQUIFILM TEARS) 1.4 % ophthalmic solution Place 1 drop into both eyes as needed for dry eyes.    . potassium chloride (K-DUR) 10 MEQ tablet TAKE 1 TABLET(10 MEQ) BY MOUTH DAILY 90 tablet 3  . ranitidine (ZANTAC) 75 MG tablet Take 75 mg by mouth daily.    . rosuvastatin (CRESTOR) 20 MG tablet Take 1 tablet (20 mg total) by mouth daily. 90 tablet 3  . TURMERIC PO Take 1 tablet by mouth daily.     No current facility-administered medications on file prior to visit.    Allergies  Allergen Reactions  . Augmentin [Amoxicillin-Pot Clavulanate] Diarrhea    Did it involve swelling of the face/tongue/throat, SOB, or low BP? yes Did it involve sudden or severe rash/hives, skin peeling, or any reaction on the inside of your mouth or nose? no Did you need  to seek medical attention at a hospital or doctor's office? no When did it last happen?2000 If all above answers are "NO", may proceed with cephalosporin use.   . Niacin-Lovastatin Er     Flushing,itching,syncope   Social History   Socioeconomic History  . Marital status: Married    Spouse name: Not on file  . Number of children: Not on file  . Years of education: Not on file  . Highest education level: Not on file  Occupational History  . Not on file  Social Needs  . Financial resource strain: Not on file  . Food insecurity:    Worry: Not on file    Inability: Not on file  . Transportation needs:    Medical: Not on file    Non-medical: Not on file  Tobacco Use  . Smoking status: Never Smoker  . Smokeless tobacco: Never Used  Substance and Sexual Activity  . Alcohol use: Yes    Comment: rarely  . Drug use: No  . Sexual activity: Not  Currently    Birth control/protection: Post-menopausal    Comment: 1st intercourse 78 yo-1 partner  Lifestyle  . Physical activity:    Days per week: Not on file    Minutes per session: Not on file  . Stress: Not on file  Relationships  . Social connections:    Talks on phone: Not on file    Gets together: Not on file    Attends religious service: Not on file    Active member of club or organization: Not on file    Attends meetings of clubs or organizations: Not on file    Relationship status: Not on file  . Intimate partner violence:    Fear of current or ex partner: Not on file    Emotionally abused: Not on file    Physically abused: Not on file    Forced sexual activity: Not on file  Other Topics Concern  . Not on file  Social History Narrative  . Not on file      Review of Systems  All other systems reviewed and are negative.      Objective:   Physical Exam Vitals signs reviewed.  Constitutional:      General: She is not in acute distress.    Appearance: Normal appearance. She is not ill-appearing.  HENT:     Head: Normocephalic. Laceration present.   Cardiovascular:     Rate and Rhythm: Normal rate and regular rhythm.     Heart sounds: No murmur.  Pulmonary:     Effort: Pulmonary effort is normal. No respiratory distress.     Breath sounds: Normal breath sounds. No wheezing, rhonchi or rales.  Neurological:     Mental Status: She is alert.           Assessment & Plan:  Suture removal  Sutures removed without difficulty.  Wound reinforced with Steri-Strips.  Wound care was discussed.  No evidence of secondary cellulitis.

## 2019-02-20 ENCOUNTER — Ambulatory Visit: Payer: Medicare Other | Admitting: Cardiovascular Disease

## 2019-02-20 ENCOUNTER — Encounter: Payer: Self-pay | Admitting: Cardiovascular Disease

## 2019-02-20 VITALS — BP 128/62 | HR 59 | Ht 65.0 in | Wt 150.4 lb

## 2019-02-20 DIAGNOSIS — E7800 Pure hypercholesterolemia, unspecified: Secondary | ICD-10-CM

## 2019-02-20 DIAGNOSIS — I370 Nonrheumatic pulmonary valve stenosis: Secondary | ICD-10-CM | POA: Diagnosis not present

## 2019-02-20 DIAGNOSIS — I10 Essential (primary) hypertension: Secondary | ICD-10-CM

## 2019-02-20 MED ORDER — CARVEDILOL 12.5 MG PO TABS: ORAL_TABLET | ORAL | 3 refills | Status: DC

## 2019-02-20 MED ORDER — ROSUVASTATIN CALCIUM 20 MG PO TABS: 20.0000 mg | ORAL_TABLET | Freq: Every day | ORAL | 3 refills | Status: DC

## 2019-02-20 MED ORDER — HYDROCHLOROTHIAZIDE 12.5 MG PO TABS: 12.5000 mg | ORAL_TABLET | Freq: Every day | ORAL | 3 refills | Status: DC

## 2019-02-20 MED ORDER — POTASSIUM CHLORIDE ER 10 MEQ PO TBCR: EXTENDED_RELEASE_TABLET | ORAL | 3 refills | Status: DC

## 2019-02-20 NOTE — Progress Notes (Signed)
Cardiology Office Note   Date:  02/20/2019   ID:  Heather, Hoover [birth year], MRN [id number]  PCP:  Donita Brooks, MD  Cardiologist:   Chilton Si, MD   No chief complaint on file.    History of Present Illness: Heather Hoover is a 73 y.o. female with a history of hypertension, hyperlipidemia, and congenital pulmonic stenosis who presents for follow up.  [name] was previously a patient of Dr. [name].  She had heart catheterizations at ages 23 and 44.  Each time she reportedly had mild pulmonic stenosis.  She saw Heather Hoover 66/2017 and reported chest heaviness after the death of her sister.  She also reported increased shortness of breath.  She had an echo 05/19/16 revealed LVEF 55-60% with mild mitral regurgitation, mild aortic regurgitation and mild pulmonary stenosis. The peak gradient is 20 mmHg.    At her last appointment HCTZ was reduced to 12.5mg  due to low BP.  This was in the setting of losing 50lb.  She has been able to lose 10 more pounds by continuing to follow with weight watchers.  She has been feeling generally well.  She occasionally has waves of fatigue.  She notes that this is especially prominent when she is overheating.  She has no chest pain or shortness of breath.  She was exercising regularly before recent fall.  She was walking for 1 mile daily and had no exertional symptoms.  Some days she felt tired during her walk but this always seemed to improve after eating.  02/11/2019 she got up to go to the bathroom in the middle the night and slipped on the floor.  It was a mechanical fall and there was no preceding symptoms.  She suffered a chin laceration that required sutures.  She has otherwise been well and is without complaint.   Past Medical History:  Diagnosis Date  . Arthritis   . Bone spur   . DES exposure in utero   . Dyslipidemia   . Exogenous obesity   . Heart murmur   . Hypertension   . Mild pulmonic stenosis by prior  echocardiogram 10/28/2010   echo  . Osteopenia 12/2018   T score -1.6 FRAX 10% / 1.5%    Past Surgical History:  Procedure Laterality Date  . ABDOMINAL HYSTERECTOMY  02/07/1981   RSO  . APPENDECTOMY    . BREAST SURGERY    . CARDIAC CATHETERIZATION     when in 3rd grade and age 82  . childbirth     x 2 vaginal deliveries  . COSMETIC SURGERY    . forehead plastic surgery     MOH's surgery  . FRACTURE SURGERY    . KNEE ARTHROSCOPY     right  . OOPHORECTOMY     RSO     Current Outpatient Medications  Medication Sig Dispense Refill  . acetaminophen (TYLENOL) 325 MG tablet Take 650 mg by mouth every 6 (six) hours as needed for mild pain or headache.    . carvedilol (COREG) 12.5 MG tablet TAKE 1 TABLET(12.5 MG) BY MOUTH TWICE DAILY 180 tablet 3  . Cholecalciferol (VITAMIN D-3 PO) Take 2,000 Units by mouth daily. Taking 2000 daily    . ciclopirox (LOPROX) 0.77 % cream Apply 1 application topically daily as needed (athletes foot).     Marland Kitchen diclofenac (VOLTAREN) 75 MG EC tablet TAKE 1 TABLET(75 MG) BY MOUTH TWICE DAILY 60 tablet 5  . Estradiol 10 MCG TABS vaginal tablet  INSERT 1 TABLET VAGINALLY TWICE WEEKLY 24 tablet 4  . Fexofenadine HCl (ALLEGRA PO) Take by mouth daily as needed.    . fluticasone (FLONASE) 50 MCG/ACT nasal spray USE 2 SPRAYS IN BOTH NOSTRILS EVERY DAY 16 g 11  . hydrochlorothiazide (HYDRODIURIL) 12.5 MG tablet Take 1 tablet (12.5 mg total) by mouth daily. 90 tablet 3  . polyvinyl alcohol (LIQUIFILM TEARS) 1.4 % ophthalmic solution Place 1 drop into both eyes as needed for dry eyes.    . potassium chloride (K-DUR) 10 MEQ tablet TAKE 1 TABLET(10 MEQ) BY MOUTH DAILY 90 tablet 3  . rosuvastatin (CRESTOR) 20 MG tablet Take 1 tablet (20 mg total) by mouth daily. 90 tablet 3  . TURMERIC PO Take 1 tablet by mouth daily.     No current facility-administered medications for this visit.     Allergies:   Augmentin [amoxicillin-pot clavulanate] and Niacin-lovastatin er     Social History:  The patient  reports that she has never smoked. She has never used smokeless tobacco. She reports current alcohol use. She reports that she does not use drugs.   Family History:  The patient's family history includes Arthritis in her father; Breast cancer (age of onset: 18) in her sister; Breast cancer (age of onset: 37) in her mother; Cancer in her mother; Cancer (age of onset: 46) in her sister; Cancer (age of onset: 74) in her father; Heart failure in her sister; Hypertension in her father, mother, and sister; Muscular dystrophy in her brother.    ROS:  Please see the history of present illness.   Otherwise, review of systems are positive for none.   All other systems are reviewed and negative.    PHYSICAL EXAM: VS:  BP 128/62   Pulse (!) 59   Ht 5\' 5"  (1.651 m)   Wt 150 lb 6.4 oz (68.2 kg)   BMI 25.03 kg/m  , BMI Body mass index is 25.03 kg/m. GENERAL:  Well appearing HEENT: Pupils equal round and reactive, fundi not visualized, oral mucosa unremarkable NECK:  No jugular venous distention, waveform within normal limits, carotid upstroke brisk and symmetric, no bruits, no thyromegaly LYMPHATICS:  No cervical adenopathy LUNGS:  Clear to auscultation bilaterally HEART:  RRR.  PMI not displaced or sustained,S1 and S2 within normal limits, no S3, no S4, no clicks, no rubs, II/VI systolic murmur at the RUSB ABD:  Flat, positive bowel sounds normal in frequency in pitch, no bruits, no rebound, no guarding, no midline pulsatile mass, no hepatomegaly, no splenomegaly EXT:  2 plus pulses throughout, no edema, no cyanosis no clubbing SKIN:  No rashes no nodules NEURO:  Cranial nerves II through XII grossly intact, motor grossly intact throughout PSYCH:  Cognitively intact, oriented to person place and time    EKG:  EKG is ordered today. The ekg ordered 10/26/16 demonstrates sinus rhythm rate 77 bpm. 02/18/18: Sinus bradycardia.  Rate 57 bpm. 02/20/19: Sinus bradycardia.   Rate 59 bpm.    Recent Labs: 06/28/2018: Hemoglobin 12.9; Platelets 178    Lipid Panel    Component Value Date/Time   CHOL 145 02/11/2018 0853   TRIG 76 02/11/2018 0853   HDL 64 02/11/2018 0853   CHOLHDL 2.3 02/11/2018 0853   CHOLHDL 2.5 04/13/2017 0820   VLDL 22 04/13/2017 0820   LDLCALC 66 02/11/2018 0853      Wt Readings from Last 3 Encounters:  02/20/19 150 lb 6.4 oz (68.2 kg)  02/17/19 152 lb (68.9 kg)  02/11/19 148 lb (  67.1 kg)      ASSESSMENT AND PLAN:  # Hypertension:  Blood pressure is well-controlled.  Continue HCTZ and carvedilol.  # Mild pulmonic stenosis:  Stable on echo 05/19/16 revealed a peak gradient of 20 mmHg. We will repeat her echo given that it has been 3 years.  # Hyperlipidemia: Continue rosuvastatin.  LDL 66 on 02/11/18.  Check lipids/CMP.    Current medicines are reviewed at length with the patient today.  The patient does not have concerns regarding medicines.  The following changes have been made:  1 year  Labs/ tests ordered today include:   Orders Placed This Encounter  Procedures  . Lipid panel  . Comprehensive metabolic panel  . EKG 12-Lead  . ECHOCARDIOGRAM COMPLETE     Disposition:   FU with Raksha Wolfgang C. Duke Salvia, MD, [facility] in 1 year.      Signed, Vashaun Osmon C. Duke Salvia, MD, [facility]  02/20/2019 12:47 PM    [facility]

## 2019-02-20 NOTE — Patient Instructions (Signed)
Medication Instructions:  Your physician recommends that you continue on your current medications as directed. Please refer to the Current Medication list given to you today.  If you need a refill on your cardiac medications before your next appointment, please call your pharmacy.   Lab work: FASTING LP/CMET SOON   If you have labs (blood work) drawn today and your tests are completely normal, you will receive your results only by: Marland Kitchen MyChart Message (if you have MyChart) OR . A paper copy in the mail If you have any lab test that is abnormal or we need to change your treatment, we will call you to review the results.  Testing/Procedures: Your physician has requested that you have an echocardiogram. Echocardiography is a painless test that uses sound waves to create images of your heart. It provides your doctor with information about the size and shape of your heart and how well your heart's chambers and valves are working. This procedure takes approximately one hour. There are no restrictions for this procedure. [facility] AT [address]  Follow-Up: At [facility], you and your health needs are our priority.  As part of our continuing mission to provide you with exceptional heart care, we have created designated Provider Care Teams.  These Care Teams include your primary Cardiologist (physician) and Advanced Practice Providers (APPs -  Physician Assistants and Nurse Practitioners) who all work together to provide you with the care you need, when you need it. You will need a follow up appointment in 12 months.  Please call our office 2 months in advance to schedule this appointment.  You may see Chilton Si, MD or one of the following Advanced Practice Providers on your designated Care Team:   Corine Shelter, PA-C Judy Pimple, [place] . Marjie Skiff, PA-C  Any Other Special Instructions Will Be Listed Below (If Applicable).   Echocardiogram An echocardiogram is a  procedure that uses painless sound waves (ultrasound) to produce an image of the heart. Images from an echocardiogram can provide important information about:  Signs of coronary artery disease (CAD).  Aneurysm detection. An aneurysm is a weak or damaged part of an artery wall that bulges out from the normal force of blood pumping through the body.  Heart size and shape. Changes in the size or shape of the heart can be associated with certain conditions, including heart failure, aneurysm, and CAD.  Heart muscle function.  Heart valve function.  Signs of a past heart attack.  Fluid buildup around the heart.  Thickening of the heart muscle.  A tumor or infectious growth around the heart valves. Tell a health care provider about:  Any allergies you have.  All medicines you are taking, including vitamins, herbs, eye drops, creams, and over-the-counter medicines.  Any blood disorders you have.  Any surgeries you have had.  Any medical conditions you have.  Whether you are pregnant or may be pregnant. What are the risks? Generally, this is a safe procedure. However, problems may occur, including:  Allergic reaction to dye (contrast) that may be used during the procedure. What happens before the procedure? No specific preparation is needed. You may eat and drink normally. What happens during the procedure?   An IV tube may be inserted into one of your veins.  You may receive contrast through this tube. A contrast is an injection that improves the quality of the pictures from your heart.  A gel will be applied to your chest.  A  wand-like tool (transducer) will be moved over your chest. The gel will help to transmit the sound waves from the transducer.  The sound waves will harmlessly bounce off of your heart to allow the heart images to be captured in real-time motion. The images will be recorded on a computer. The procedure may vary among health care providers and  hospitals. What happens after the procedure?  You may return to your normal, everyday life, including diet, activities, and medicines, unless your health care provider tells you not to do that. Summary  An echocardiogram is a procedure that uses painless sound waves (ultrasound) to produce an image of the heart.  Images from an echocardiogram can provide important information about the size and shape of your heart, heart muscle function, heart valve function, and fluid buildup around your heart.  You do not need to do anything to prepare before this procedure. You may eat and drink normally.  After the echocardiogram is completed, you may return to your normal, everyday life, unless your health care provider tells you not to do that. This information is not intended to replace advice given to you by your health care provider. Make sure you discuss any questions you have with your health care provider. Document Released: 12/04/2000 Document Revised: 01/09/2017 Document Reviewed: 01/09/2017 Elsevier Interactive Patient Education  2019 [employer].

## 2019-02-22 LAB — COMPREHENSIVE METABOLIC PANEL
ALT: 11 IU/L (ref 0–32)
AST: 18 IU/L (ref 0–40)
Albumin/Globulin Ratio: 2.4 — ABNORMAL HIGH (ref 1.2–2.2)
Albumin: 4.5 g/dL (ref 3.7–4.7)
Alkaline Phosphatase: 58 IU/L (ref 39–117)
BUN/Creatinine Ratio: 23 (ref 12–28)
BUN: 15 mg/dL (ref 8–27)
Bilirubin Total: 0.3 mg/dL (ref 0.0–1.2)
CO2: 25 mmol/L (ref 20–29)
Calcium: 9.4 mg/dL (ref 8.7–10.3)
Chloride: 97 mmol/L (ref 96–106)
Creatinine, Ser: 0.65 mg/dL (ref 0.57–1.00)
GFR calc Af Amer: 103 mL/min/{1.73_m2} (ref >59)
GFR calc non Af Amer: 90 mL/min/{1.73_m2} (ref >59)
Globulin, Total: 1.9 g/dL (ref 1.5–4.5)
Glucose: 88 mg/dL (ref 65–99)
Potassium: 4 mmol/L (ref 3.5–5.2)
Sodium: 137 mmol/L (ref 134–144)
Total Protein: 6.4 g/dL (ref 6.0–8.5)

## 2019-02-22 LAB — LIPID PANEL
Chol/HDL Ratio: 2.1 ratio (ref 0.0–4.4)
Cholesterol, Total: 142 mg/dL (ref 100–199)
HDL: 67 mg/dL (ref >39)
LDL Calculated: 61 mg/dL (ref 0–99)
Triglycerides: 72 mg/dL (ref 0–149)
VLDL Cholesterol Cal: 14 mg/dL (ref 5–40)

## 2019-03-01 ENCOUNTER — Ambulatory Visit (HOSPITAL_COMMUNITY): Payer: Medicare Other | Attending: Cardiovascular Disease

## 2019-03-01 DIAGNOSIS — I370 Nonrheumatic pulmonary valve stenosis: Secondary | ICD-10-CM | POA: Diagnosis not present

## 2019-05-09 ENCOUNTER — Other Ambulatory Visit: Payer: Self-pay

## 2019-05-09 MED ORDER — ROSUVASTATIN CALCIUM 20 MG PO TABS: 20.0000 mg | ORAL_TABLET | Freq: Every day | ORAL | 3 refills | Status: DC

## 2019-08-02 ENCOUNTER — Other Ambulatory Visit: Payer: Self-pay | Admitting: Family Medicine

## 2019-09-27 ENCOUNTER — Encounter: Payer: Self-pay | Admitting: Gynecology

## 2019-11-09 LAB — HM MAMMOGRAPHY

## 2019-11-23 ENCOUNTER — Encounter: Payer: Self-pay | Admitting: *Deleted

## 2019-11-29 ENCOUNTER — Other Ambulatory Visit: Payer: Self-pay

## 2019-11-30 ENCOUNTER — Other Ambulatory Visit: Payer: Self-pay

## 2019-11-30 ENCOUNTER — Encounter: Payer: Medicare Other | Admitting: Gynecology

## 2019-11-30 DIAGNOSIS — Z20822 Contact with and (suspected) exposure to covid-19: Secondary | ICD-10-CM

## 2019-12-02 LAB — NOVEL CORONAVIRUS, NAA: SARS-CoV-2, NAA: NOT DETECTED

## 2019-12-11 ENCOUNTER — Other Ambulatory Visit: Payer: Self-pay

## 2019-12-12 ENCOUNTER — Encounter: Payer: Self-pay | Admitting: Gynecology

## 2019-12-12 ENCOUNTER — Ambulatory Visit (INDEPENDENT_AMBULATORY_CARE_PROVIDER_SITE_OTHER): Payer: Medicare Other | Admitting: Gynecology

## 2019-12-12 VITALS — BP 122/78 | Ht 65.0 in | Wt 151.0 lb

## 2019-12-12 DIAGNOSIS — Z9189 Other specified personal risk factors, not elsewhere classified: Secondary | ICD-10-CM | POA: Diagnosis not present

## 2019-12-12 DIAGNOSIS — M8580 Other specified disorders of bone density and structure, unspecified site: Secondary | ICD-10-CM

## 2019-12-12 DIAGNOSIS — N952 Postmenopausal atrophic vaginitis: Secondary | ICD-10-CM

## 2019-12-12 DIAGNOSIS — Z01419 Encounter for gynecological examination (general) (routine) without abnormal findings: Secondary | ICD-10-CM

## 2019-12-12 MED ORDER — ESTRADIOL 10 MCG VA TABS: ORAL_TABLET | VAGINAL | 4 refills | Status: DC

## 2019-12-12 NOTE — Patient Instructions (Signed)
Stop the Vagifem as we discussed.  Try over-the-counter vaginal moisturizers as needed.  Restart Vagifem if this is unacceptable.  Follow-up with primary provider to decide on best colon screening strategy.  Follow-up in 1 year for annual exam

## 2019-12-12 NOTE — Progress Notes (Signed)
    Heather Hoover 1946/04/15 [id number]        73 y.o.  [id number] for annual gynecologic exam.  Without gynecologic complaints  Past medical history,surgical history, problem list, medications, allergies, family history and social history were all reviewed and documented as reviewed in the EPIC chart.  ROS:  Performed with pertinent positives and negatives included in the history, assessment and plan.   Additional significant findings : None   Exam: Caryn Bee assistant Vitals:   12/12/19 0959  BP: 122/78  Weight: 151 lb (68.5 kg)  Height: 5\' 5"  (1.651 m)   Body mass index is 25.13 kg/m.  General appearance:  Normal affect, orientation and appearance. Skin: Grossly normal HEENT: Without gross lesions.  No cervical or supraclavicular adenopathy. Thyroid normal.  Lungs:  Clear without wheezing, rales or rhonchi Cardiac: RR, without RMG Abdominal:  Soft, nontender, without masses, guarding, rebound, organomegaly or hernia Breasts:  Examined lying and sitting without masses, retractions, discharge or axillary adenopathy. Pelvic:  Ext, BUS, Vagina: With atrophic changes.  Pap smear done  Adnexa: Without masses or tenderness    Anus and perineum: Normal   Rectovaginal: Normal sphincter tone without palpated masses or tenderness.    Assessment/Plan:  73 y.o. [id number] female for annual gynecologic exam.  Status post [place].  1. Postmenopausal/atrophic genital changes.  Has been using Vagifem twice weekly but wonders about stopping it.  Currently not sexually active.  Was thinking about trying OTC moisturizer for dryness.  Recommended she go ahead and stop it now and see how she does.  I did give her a written prescription to have available in the event she does not tolerate being off of the Vagifem and she will go ahead and restart this.  We have discussed the risks of absorption before. 2. Mammography 10/2019.  Continue with annual mammography next year.  Breast exam normal  today. 3. DES exposure in utero.  Pap smear of vaginal cuff today.  We will continue with annual cytology. 4. Colonoscopy 2010.  Had discussed Cologuard versus colonoscopy with her primary provider.  Colonoscopy as gold standard also reviewed.  Will follow up with primary provider for decision for screening. 5. Osteopenia.  DEXA 2020 T score -1.4 FRAX 10% / 1.5%.  Repeat DEXA at 2-year interval. 6. Health maintenance.  No routine lab work done as patient does this elsewhere.  Follow-up 1 year, sooner as needed.   Anastasio Auerbach MD, 10:36 AM 12/12/2019

## 2019-12-12 NOTE — Addendum Note (Signed)
Addended by: Nelva Nay on: 12/12/2019 11:38 AM   Modules accepted: Orders

## 2019-12-13 LAB — PAP IG W/ RFLX HPV ASCU

## 2020-01-21 ENCOUNTER — Ambulatory Visit: Payer: Medicare Other

## 2020-01-24 ENCOUNTER — Other Ambulatory Visit: Payer: Self-pay | Admitting: Cardiovascular Disease

## 2020-01-27 ENCOUNTER — Ambulatory Visit: Payer: Medicare PPO | Attending: Internal Medicine

## 2020-01-27 DIAGNOSIS — Z23 Encounter for immunization: Secondary | ICD-10-CM

## 2020-01-27 NOTE — Progress Notes (Signed)
   Covid-19 Vaccination Clinic  Name:  Heather Hoover    MRN: [id number] DOB: 09-25-1946  01/27/2020  [name] was observed post Covid-19 immunization for 15 minutes without incidence. She was provided with Vaccine Information Sheet and instruction to access the V-Safe system.   [name] was instructed to call 911 with any severe reactions post vaccine: Marland Kitchen Difficulty breathing  . Swelling of your face and throat  . A fast heartbeat  . A bad rash all over your body  . Dizziness and weakness    Immunizations Administered    Name Date Dose VIS Date Route   Pfizer COVID-19 Vaccine 01/27/2020 10:44 AM 0.3 mL 12/01/2019 Intramuscular   Manufacturer: [employer], [employer]   Lot: [id number]   NDC: [address]-2

## 2020-02-11 ENCOUNTER — Other Ambulatory Visit: Payer: Self-pay | Admitting: Cardiovascular Disease

## 2020-02-11 ENCOUNTER — Ambulatory Visit: Payer: Medicare Other

## 2020-02-20 ENCOUNTER — Ambulatory Visit: Payer: Medicare PPO | Attending: Internal Medicine

## 2020-02-20 ENCOUNTER — Ambulatory Visit: Payer: Medicare Other | Admitting: Cardiovascular Disease

## 2020-02-20 DIAGNOSIS — Z23 Encounter for immunization: Secondary | ICD-10-CM | POA: Insufficient documentation

## 2020-02-20 NOTE — Progress Notes (Signed)
   Covid-19 Vaccination Clinic  Name:  ALEISA HOWK    MRN: [id number] DOB: 26-Jul-1946  02/20/2020  Ms. Tsou was observed post Covid-19 immunization for 15 minutes without incident. She was provided with Vaccine Information Sheet and instruction to access the V-Safe system.   Ms. Myhre was instructed to call 911 with any severe reactions post vaccine: Marland Kitchen Difficulty breathing  . Swelling of face and throat  . A fast heartbeat  . A bad rash all over body  . Dizziness and weakness   Immunizations Administered    Name Date Dose VIS Date Route   Pfizer COVID-19 Vaccine 02/20/2020 10:17 AM 0.3 mL 12/01/2019 Intramuscular   Manufacturer: [employer], [employer]   Lot: [id number]   NDC: [address]-9

## 2020-03-12 ENCOUNTER — Ambulatory Visit: Payer: Medicare PPO | Admitting: Cardiovascular Disease

## 2020-03-12 ENCOUNTER — Encounter: Payer: Self-pay | Admitting: Cardiovascular Disease

## 2020-03-12 ENCOUNTER — Other Ambulatory Visit: Payer: Self-pay

## 2020-03-12 VITALS — BP 104/72 | HR 68 | Ht 66.0 in | Wt 151.0 lb

## 2020-03-12 DIAGNOSIS — I119 Hypertensive heart disease without heart failure: Secondary | ICD-10-CM | POA: Diagnosis not present

## 2020-03-12 DIAGNOSIS — Q221 Congenital pulmonary valve stenosis: Secondary | ICD-10-CM | POA: Diagnosis not present

## 2020-03-12 DIAGNOSIS — E7800 Pure hypercholesterolemia, unspecified: Secondary | ICD-10-CM | POA: Diagnosis not present

## 2020-03-12 NOTE — Patient Instructions (Signed)
Medication Instructions:  DECREASE YOUR CARVEDILOL TO 1/2 TABLET TWICE A DAY  MONITOR YOUR BLOOD PRESSURE AT HOME. IF IT REMAINS LESS THAN 130/80 OK TO STOP COMPLETELY   *If you need a refill on your cardiac medications before your next appointment, please call your pharmacy*  Lab Work: NONE   Testing/Procedures: NONE  Follow-Up: At [facility], you and your health needs are our priority.  As part of our continuing mission to provide you with exceptional heart care, we have created designated Provider Care Teams.  These Care Teams include your primary Cardiologist (physician) and Advanced Practice Providers (APPs -  Physician Assistants and Nurse Practitioners) who all work together to provide you with the care you need, when you need it.  We recommend signing up for the patient portal called "MyChart".  Sign up information is provided on this After Visit Summary.  MyChart is used to connect with patients for Virtual Visits (Telemedicine).  Patients are able to view lab/test results, encounter notes, upcoming appointments, etc.  Non-urgent messages can be sent to your provider as well.   To learn more about what you can do with MyChart, go to [email or web address].    Your next appointment:   6 month(s)  You will receive a reminder letter in the mail two months in advance. If you don't receive a letter, please call our office to schedule the follow-up appointment.   The format for your next appointment:   In Person  Provider:   You may see Chilton Si, MD  or one of the following Advanced Practice Providers on your designated Care Team:    Corine Shelter, PA-C  [place], [place]  Edd Fabian, [place]  Other Instructions   MONITOR YOUR BLOOD PRESSURE AS ABOVE. IF YOU STOP THE CARVEDILOL CONTINUE TO MONITOR

## 2020-03-12 NOTE — Progress Notes (Signed)
Cardiology Office Note   Date:  03/12/2020   ID:  Heather, Hoover 1946/10/09, MRN [id number]  PCP:  Heather Frizzle, MD  Cardiologist:   Heather Latch, MD   Chief Complaint  Patient presents with  . Fatigue     History of Present Illness: Heather Hoover is a 74 y.o. female with a history of hypertension, hyperlipidemia, and congenital pulmonic stenosis who presents for follow up.  Heather Hoover was previously a patient of Heather Hoover.  She had heart catheterizations at ages 73 and 87.  Each time she reportedly had mild pulmonic stenosis.  She saw Heather Hoover 66/2017 and reported chest heaviness after the death of her sister.  She also reported increased shortness of breath.  She had an echo 05/19/16 revealed LVEF 55-60% with mild mitral regurgitation, mild aortic regurgitation and mild pulmonary stenosis. The peak gradient is 20 mmHg.  HCTZ was reduced to 12.5mg  due to low BP.  This was in the setting of losing 50lb. she had a repeat echocardiogram 02/2019 that revealed LVEF 60 to 65% with grade 2 diastolic function.  Pulmonary pressure was mildly elevated at 9.8 mmHg.  Since her last appointment Heather Hoover has been doing well.  She has been walking 1 to 2 miles daily.  She has no exertional chest pain.  Sometimes she gets a little short of breath with walking up an incline but nothing unexpected.  She denies lower extremity edema, orthopnea, or PND.  She has noticed over the last year that she feels more tired.  This seems to be worse in the afternoons.  She finds her self having to take a nap.  She is unsure if this may be related to the pandemic and being home more.  She checks her blood pressure at home and it typically runs around 114/60.  She recently had a lot of dental work done and her dentist remarked that she may have Sjogren's syndrome due to dry eyes, dry mouth, etc.  She plans to follow-up with her PCP about this.  She has both Covid vaccinations.  She continues  to keep her weight off by using weight watchers and exercising regularly.   Past Medical History:  Diagnosis Date  . Arthritis   . Bone spur   . DES exposure in utero   . Dyslipidemia   . Exogenous obesity   . Heart murmur   . Hypertension   . 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 Osteopenia 12/2018   T score -1.6 FRAX 10% / 1.5%    Past Surgical History:  Procedure Laterality Date  . ABDOMINAL HYSTERECTOMY  02/07/1981   RSO  . APPENDECTOMY    . BREAST SURGERY    . CARDIAC CATHETERIZATION     when in 3rd grade and age 43  . childbirth     x 2 vaginal deliveries  . COSMETIC SURGERY    . forehead plastic surgery     MOH's surgery  . FRACTURE SURGERY    . KNEE ARTHROSCOPY     right  . OOPHORECTOMY     RSO     Current Outpatient Medications  Medication Sig Dispense Refill  . acetaminophen (TYLENOL) 325 MG tablet Take 650 mg by mouth every 6 (six) hours as needed for mild pain or headache.    . carvedilol (COREG) 12.5 MG tablet Take 12.5 mg by mouth as directed. TAKE 1/2 TABLET TWICE A DAY    .  Cholecalciferol (VITAMIN D-3 PO) Take 2,000 Units by mouth daily. Taking 2000 daily    . ciclopirox (LOPROX) 0.77 % cream Apply 1 application topically daily as needed (athletes foot).     Marland Kitchen diclofenac (VOLTAREN) 75 MG EC tablet TAKE 1 TABLET(75 MG) BY MOUTH TWICE DAILY 60 tablet 5  . Famotidine (PEPCID PO) Take by mouth.    . Fexofenadine HCl (ALLEGRA PO) Take by mouth daily as needed.    . hydrochlorothiazide (HYDRODIURIL) 12.5 MG tablet TAKE 1 TABLET(12.5 MG) BY MOUTH DAILY 90 tablet 0  . polyvinyl alcohol (LIQUIFILM TEARS) 1.4 % ophthalmic solution Place 1 drop into both eyes as needed for dry eyes.    . potassium chloride (KLOR-CON) 10 MEQ tablet TAKE 1 TABLET(10 MEQ) BY MOUTH DAILY 90 tablet 0  . rosuvastatin (CRESTOR) 20 MG tablet Take 1 tablet (20 mg total) by mouth daily. 90 tablet 3   No current facility-administered medications for this  visit.    Allergies:   Augmentin [amoxicillin-pot clavulanate] and Niacin-lovastatin er    Social History:  The patient  reports that she has never smoked. She has never used smokeless tobacco. She reports current alcohol use. She reports that she does not use drugs.   Family History:  The patient's family history includes Arthritis in her father; Breast cancer (age of onset: 7) in her sister; Breast cancer (age of onset: 40) in her mother; Cancer in her mother; Cancer (age of onset: 68) in her sister; Cancer (age of onset: 34) in her father; Heart failure in her sister; Hypertension in her father, mother, and sister; Muscular dystrophy in her brother.    ROS:  Please see the history of present illness.   Otherwise, review of systems are positive for none.   All other systems are reviewed and negative.    PHYSICAL EXAM: VS:  BP 104/72   Pulse 68   Ht 5\' 6"  (1.676 m)   Wt 151 lb (68.5 kg)   SpO2 95%   BMI 24.37 kg/m  , BMI Body mass index is 24.37 kg/m. GENERAL:  Well appearing HEENT: Pupils equal round and reactive, fundi not visualized, oral mucosa unremarkable NECK:  No jugular venous distention, waveform within normal limits, carotid upstroke brisk and symmetric, no bruits, LUNGS:  Clear to auscultation bilaterally HEART:  RRR.  PMI not displaced or sustained,S1 and S2 within normal limits, no S3, no S4, no clicks, no rubs, II/VI systolic murmur at the RUSB ABD:  Flat, positive bowel sounds normal in frequency in pitch, no bruits, no rebound, no guarding, no midline pulsatile mass, no hepatomegaly, no splenomegaly EXT:  2 plus pulses throughout, no edema, no cyanosis no clubbing SKIN:  No rashes no nodules NEURO:  Cranial nerves II through XII grossly intact, motor grossly intact throughout PSYCH:  Cognitively intact, oriented to person place and time   EKG:  EKG is ordered today. The ekg ordered 10/26/16 demonstrates sinus rhythm rate 77 bpm. 02/18/18: Sinus bradycardia.  Rate  57 bpm. 02/20/19: Sinus bradycardia.  Rate 59 bpm.  03/12/2020: Sinus rhythm.  Rate 68 bpm.  Echo 02/2019: 1. The left ventricle has normal systolic function with an ejection  fraction of 60-65%. The cavity size was normal. Left ventricular diastolic  Doppler parameters are consistent with pseudonormalization. Indeterminate  filling pressures.  2. The right ventricle has normal systolic function. The cavity was  normal. There is no increase in right ventricular wall thickness.  3. Left atrial size was mildly dilated.  4. Mild  pulmonic stenosis.    Recent Labs: No results found for requested labs within last 8760 hours.    Lipid Panel    Component Value Date/Time   CHOL 142 02/22/2019 0829   TRIG 72 02/22/2019 0829   HDL 67 02/22/2019 0829   CHOLHDL 2.1 02/22/2019 0829   CHOLHDL 2.5 04/13/2017 0820   VLDL 22 04/13/2017 0820   LDLCALC 61 02/22/2019 0829      Wt Readings from Last 3 Encounters:  03/12/20 151 lb (68.5 kg)  12/12/19 151 lb (68.5 kg)  02/20/19 150 lb 6.4 oz (68.2 kg)      ASSESSMENT AND PLAN:  # Hypertension:  Blood pressure is well-controlled.  Her blood pressure has been low at times and she notices lightheadedness when doing yoga.  We will reduce carvedilol to 6.25 mg twice daily.  This may also help with fatigue.  If her blood pressure remains less than 130/80 would be okay for her to stop it and continue to track her blood pressure.  Continue hydrochlorothiazide.  # Mild pulmonic stenosis:  Stable on echo 02/2019.  We will repeat echo 02/2022.  # Hyperlipidemia: Continue rosuvastatin.  LDL 61 on 02/2019.   Current medicines are reviewed at length with the patient today.  The patient does not have concerns regarding medicines.  The following changes have been made:  1 year  Labs/ tests ordered today include:   No orders of the defined types were placed in this encounter.    Disposition:   FU with Bentlee Benningfield C. Duke Salvia, MD, [facility] in 1 year.       Signed, Tobey Lippard C. Duke Salvia, MD, [facility]  03/12/2020 3:55 PM    [facility]

## 2020-04-04 ENCOUNTER — Other Ambulatory Visit: Payer: Self-pay | Admitting: Family Medicine

## 2020-04-04 DIAGNOSIS — E6609 Other obesity due to excess calories: Secondary | ICD-10-CM

## 2020-04-04 DIAGNOSIS — E7800 Pure hypercholesterolemia, unspecified: Secondary | ICD-10-CM

## 2020-04-04 DIAGNOSIS — I119 Hypertensive heart disease without heart failure: Secondary | ICD-10-CM

## 2020-04-04 DIAGNOSIS — Q221 Congenital pulmonary valve stenosis: Secondary | ICD-10-CM

## 2020-04-04 DIAGNOSIS — Z0000 Encounter for general adult medical examination without abnormal findings: Secondary | ICD-10-CM

## 2020-04-10 ENCOUNTER — Other Ambulatory Visit: Payer: Self-pay

## 2020-04-10 ENCOUNTER — Other Ambulatory Visit: Payer: Medicare PPO

## 2020-04-10 DIAGNOSIS — E7800 Pure hypercholesterolemia, unspecified: Secondary | ICD-10-CM | POA: Diagnosis not present

## 2020-04-10 DIAGNOSIS — Q221 Congenital pulmonary valve stenosis: Secondary | ICD-10-CM | POA: Diagnosis not present

## 2020-04-10 DIAGNOSIS — I119 Hypertensive heart disease without heart failure: Secondary | ICD-10-CM | POA: Diagnosis not present

## 2020-04-10 DIAGNOSIS — Z0000 Encounter for general adult medical examination without abnormal findings: Secondary | ICD-10-CM

## 2020-04-10 DIAGNOSIS — R682 Dry mouth, unspecified: Secondary | ICD-10-CM | POA: Diagnosis not present

## 2020-04-10 DIAGNOSIS — E6609 Other obesity due to excess calories: Secondary | ICD-10-CM

## 2020-04-10 LAB — COMPREHENSIVE METABOLIC PANEL
BUN: 14 mg/dL (ref 7–25)
Chloride: 102 mmol/L (ref 98–110)

## 2020-04-12 ENCOUNTER — Encounter: Payer: Self-pay | Admitting: Family Medicine

## 2020-04-12 ENCOUNTER — Other Ambulatory Visit: Payer: Self-pay

## 2020-04-12 ENCOUNTER — Ambulatory Visit (INDEPENDENT_AMBULATORY_CARE_PROVIDER_SITE_OTHER): Payer: Medicare PPO | Admitting: Family Medicine

## 2020-04-12 VITALS — BP 102/64 | HR 64 | Temp 97.0°F | Resp 16 | Ht 65.0 in | Wt 150.0 lb

## 2020-04-12 DIAGNOSIS — Z0001 Encounter for general adult medical examination with abnormal findings: Secondary | ICD-10-CM | POA: Diagnosis not present

## 2020-04-12 DIAGNOSIS — I119 Hypertensive heart disease without heart failure: Secondary | ICD-10-CM

## 2020-04-12 DIAGNOSIS — G5603 Carpal tunnel syndrome, bilateral upper limbs: Secondary | ICD-10-CM | POA: Diagnosis not present

## 2020-04-12 DIAGNOSIS — E7800 Pure hypercholesterolemia, unspecified: Secondary | ICD-10-CM

## 2020-04-12 DIAGNOSIS — Q221 Congenital pulmonary valve stenosis: Secondary | ICD-10-CM | POA: Diagnosis not present

## 2020-04-12 DIAGNOSIS — R682 Dry mouth, unspecified: Secondary | ICD-10-CM | POA: Diagnosis not present

## 2020-04-12 DIAGNOSIS — Z0000 Encounter for general adult medical examination without abnormal findings: Secondary | ICD-10-CM

## 2020-04-12 NOTE — Progress Notes (Signed)
Subjective:    Patient ID: Heather Hoover, female    DOB: [birth year], 74 y.o.   MRN: [id number]  HPI Patient is here today for complete physical exam.  She is due for colonoscopy.  Patient has a history of a hysterectomy and therefore does not require Pap smears although she still sees gynecology.  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  Her mammogram was performed in November.  She denies any falls, depression, memory loss.  She does have concerns regarding chronic dry mouth and dry.  Her dentist has had to treat several cavities in her mouth.  Her dentist actually raise the concern for Sjogren's syndrome due to the number of cavities she is having an abnormal locations despite excellent oral hygiene.  The concern was that possible dry mouth could be contributing to this.  The patient also reports dry.  She has tried Biotene with no relief.  She is taking Allegra occasionally.  She is also on hydrochlorothiazide.  Her blood pressure today is extremely low at 102/64 and she does report orthostatic dizziness.  She also reports bilateral hand pain.  She reports numbness in her first second and third digits on both hands especially at night.  Her hands will ache and throb.  At times her hands will go numb.  Her symptoms are concerning for carpal tunnel syndrome.  Her most recent lab work is listed below along with her immunization records. Appointment on 04/10/2020  Component Date Value Ref Range Status  . Cholesterol 04/10/2020 151  <200 mg/dL Final  . HDL 90/38/3338 68  > OR = 50 mg/dL Final  . Triglycerides 04/10/2020 77  <150 mg/dL Final  . LDL Cholesterol (Calc) 04/10/2020 67  mg/dL (calc) Final   Comment: Reference range: <100 . Desirable range <100 mg/dL for primary prevention;   <70 mg/dL for patients with CHD or diabetic patients  with > or = 2 CHD risk factors. Marland Kitchen LDL-C is now calculated using the Martin-Hopkins    calculation, which is a validated novel method providing  better accuracy than the Friedewald equation in the  estimation of LDL-C.  Horald Pollen et al. Lenox Ahr. 3291;916(60): 2061-2068  (http://education.[facility].com/faq/FAQ164)   . Total CHOL/HDL Ratio 04/10/2020 2.2  <6.0 (calc) Final  . Non-HDL Cholesterol (Calc) 04/10/2020 83  <130 mg/dL (calc) Final   Comment: For patients with diabetes plus 1 major ASCVD risk  factor, treating to a non-HDL-C goal of <100 mg/dL  (LDL-C of <04 mg/dL) is considered a therapeutic  option.   . Glucose, Bld 04/10/2020 84  65 - 99 mg/dL Final   Comment: .            Fasting reference interval .   . BUN 04/10/2020 14  7 - 25 mg/dL Final  . Creat 59/97/7414 0.63  0.60 - 0.93 mg/dL Final   Comment: For patients >61 years of age, the reference limit for Creatinine is approximately 13% higher for people identified as African-American. .   Edwena Felty Ratio 04/10/2020 NOT APPLICABLE  6 - 22 (calc) Final  . Sodium 04/10/2020 138  135 - 146 mmol/L Final  . Potassium 04/10/2020 4.4  3.5 - 5.3 mmol/L Final  . Chloride 04/10/2020 102  98 - 110 mmol/L Final  . CO2 04/10/2020 29  20 - 32 mmol/L Final  . Calcium 04/10/2020 9.5  8.6 - 10.4 mg/dL Final  . Total Protein 04/10/2020 6.6  6.1 -  8.1 g/dL Final  . Albumin 16/09/9603 4.2  3.6 - 5.1 g/dL Final  . Globulin 54/08/8118 2.4  1.9 - 3.7 g/dL (calc) Final  . AG Ratio 04/10/2020 1.8  1.0 - 2.5 (calc) Final  . Total Bilirubin 04/10/2020 0.6  0.2 - 1.2 mg/dL Final  . Alkaline phosphatase (APISO) 04/10/2020 55  37 - 153 U/L Final  . AST 04/10/2020 19  10 - 35 U/L Final  . ALT 04/10/2020 15  6 - 29 U/L Final  . WBC 04/10/2020 4.0  3.8 - 10.8 Thousand/uL Final  . RBC 04/10/2020 4.31  3.80 - 5.10 Million/uL Final  . Hemoglobin 04/10/2020 13.6  11.7 - 15.5 g/dL Final  . HCT 14/78/2956 41.3  35.0 - 45.0 % Final  . MCV 04/10/2020 95.8  80.0 - 100.0 fL Final  . MCH 04/10/2020 31.6  27.0 - 33.0 pg Final   . MCHC 04/10/2020 32.9  32.0 - 36.0 g/dL Final  . RDW 21/30/8657 11.7  11.0 - 15.0 % Final  . Platelets 04/10/2020 164  140 - 400 Thousand/uL Final  . MPV 04/10/2020 9.9  7.5 - 12.5 fL Final  . Neutro Abs 04/10/2020 2,608  1,500 - 7,800 cells/uL Final  . Lymphs Abs 04/10/2020 968  850 - 3,900 cells/uL Final  . Absolute Monocytes 04/10/2020 332  200 - 950 cells/uL Final  . Eosinophils Absolute 04/10/2020 80  15 - 500 cells/uL Final  . Basophils Absolute 04/10/2020 12  0 - 200 cells/uL Final  . Neutrophils Relative % 04/10/2020 65.2  % Final  . Total Lymphocyte 04/10/2020 24.2  % Final  . Monocytes Relative 04/10/2020 8.3  % Final  . Eosinophils Relative 04/10/2020 2.0  % Final  . Basophils Relative 04/10/2020 0.3  % Final    Immunization History  Administered Date(s) Administered  . Fluad Quad(high Dose 65+) 08/14/2019  . Influenza, High Dose Seasonal PF 09/17/2017, 10/03/2018  . Influenza-Unspecified 09/30/2011, 10/22/2015, 09/17/2017, 09/20/2018, 08/14/2019  . PFIZER SARS-COV-2 Vaccination 01/27/2020, 02/20/2020  . Pneumococcal Conjugate-13 05/21/2014  . Pneumococcal Polysaccharide-23 04/06/2017  . Td 06/06/2012  . Tdap 06/06/2012, 02/11/2019  . Zoster 10/09/2013    Past Medical History:  Diagnosis Date  . Arthritis   . Bone spur   . DES exposure in utero   . Dyslipidemia   . Exogenous obesity   . Heart murmur   . Hypertension   . 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 Osteopenia 12/2018   T score -1.6 FRAX 10% / 1.5%   Past Surgical History:  Procedure Laterality Date  . ABDOMINAL HYSTERECTOMY  02/07/1981   RSO  . APPENDECTOMY    . BREAST SURGERY    . CARDIAC CATHETERIZATION     when in 3rd grade and age 50  . childbirth     x 2 vaginal deliveries  . COSMETIC SURGERY    . forehead plastic surgery     MOH's surgery  . FRACTURE SURGERY    . KNEE ARTHROSCOPY     right  . OOPHORECTOMY     RSO   Current Outpatient Medications on  File Prior to Visit  Medication Sig Dispense Refill  . acetaminophen (TYLENOL) 325 MG tablet Take 650 mg by mouth every 6 (six) hours as needed for mild pain or headache.    . carvedilol (COREG) 12.5 MG tablet Take 12.5 mg by mouth as directed. TAKE 1/2 TABLET TWICE A DAY    . Cholecalciferol (VITAMIN D-3 PO) Take 2,000 Units  by mouth daily. Taking 2000 daily    . ciclopirox (LOPROX) 0.77 % cream Apply 1 application topically daily as needed (athletes foot).     Marland Kitchen diclofenac (VOLTAREN) 75 MG EC tablet TAKE 1 TABLET(75 MG) BY MOUTH TWICE DAILY 60 tablet 5  . Famotidine (PEPCID PO) Take by mouth.    . Fexofenadine HCl (ALLEGRA PO) Take by mouth daily as needed.    . hydrochlorothiazide (HYDRODIURIL) 12.5 MG tablet TAKE 1 TABLET(12.5 MG) BY MOUTH DAILY 90 tablet 0  . polyvinyl alcohol (LIQUIFILM TEARS) 1.4 % ophthalmic solution Place 1 drop into both eyes as needed for dry eyes.    . potassium chloride (KLOR-CON) 10 MEQ tablet TAKE 1 TABLET(10 MEQ) BY MOUTH DAILY 90 tablet 0  . rosuvastatin (CRESTOR) 20 MG tablet Take 1 tablet (20 mg total) by mouth daily. 90 tablet 3   No current facility-administered medications on file prior to visit.   Allergies  Allergen Reactions  . Augmentin [Amoxicillin-Pot Clavulanate] Diarrhea    Did it involve swelling of the face/tongue/throat, SOB, or low BP? yes Did it involve sudden or severe rash/hives, skin peeling, or any reaction on the inside of your mouth or nose? no Did you need to seek medical attention at a hospital or doctor's office? no When did it last happen?2000 If all above answers are "NO", may proceed with cephalosporin use.   . Niacin-Lovastatin Er     Flushing,itching,syncope   Social History   Socioeconomic History  . Marital status: Married    Spouse name: Not on file  . Number of children: Not on file  . Years of education: Not on file  . Highest education level: Not on file  Occupational History  . Not on file  Tobacco  Use  . Smoking status: Never Smoker  . Smokeless tobacco: Never Used  Substance and Sexual Activity  . Alcohol use: Yes    Comment: Occas  . Drug use: No  . Sexual activity: Not Currently    Birth control/protection: Post-menopausal    Comment: 1st intercourse 78 yo-1 partner  Other Topics Concern  . Not on file  Social History Narrative  . Not on file   Social Determinants of Health   Financial Resource Strain:   . Difficulty of Paying Living Expenses:   Food Insecurity:   . Worried About Programme researcher, broadcasting/film/video in the Last Year:   . Barista in the Last Year:   Transportation Needs:   . Freight forwarder (Medical):   Marland Kitchen Lack of Transportation (Non-Medical):   Physical Activity:   . Days of Exercise per Week:   . Minutes of Exercise per Session:   Stress:   . Feeling of Stress :   Social Connections:   . Frequency of Communication with Friends and Family:   . Frequency of Social Gatherings with Friends and Family:   . Attends Religious Services:   . Active Member of Clubs or Organizations:   . Attends Banker Meetings:   Marland Kitchen Marital Status:   Intimate Partner Violence:   . Fear of Current or Ex-Partner:   . Emotionally Abused:   Marland Kitchen Physically Abused:   . Sexually Abused:    Family History  Problem Relation Age of Onset  . Hypertension Mother   . Breast cancer Mother 85       Breast   . Cancer Mother        uterine thye think  . Cancer Father 2  prostate metastized to bone  . Hypertension Father   . Arthritis Father   . Hypertension Sister   . Breast cancer Sister 84  . Cancer Sister 32       uterine  . Heart failure Sister   . Muscular dystrophy Brother       Review of Systems  All other systems reviewed and are negative.      Objective:   Physical Exam  Constitutional: She is oriented to person, place, and time. She appears well-developed and well-nourished. No distress.  HENT:  Head: Normocephalic and atraumatic.    Right Ear: External ear normal.  Left Ear: External ear normal.  Nose: Nose normal.  Mouth/Throat: Oropharynx is clear and moist. No oropharyngeal exudate.  Eyes: Pupils are equal, round, and reactive to light. Conjunctivae and EOM are normal. Right eye exhibits no discharge. Left eye exhibits no discharge. No scleral icterus.  Neck: No JVD present. No tracheal deviation present. No thyromegaly present.  Cardiovascular: Normal rate, regular rhythm and intact distal pulses. Exam reveals no gallop and no friction rub.  Murmur heard. Pulmonary/Chest: Effort normal and breath sounds normal. No stridor. No respiratory distress. She has no wheezes. She has no rales. She exhibits no tenderness.  Abdominal: Soft. Bowel sounds are normal. She exhibits no distension and no mass. There is no abdominal tenderness. There is no rebound and no guarding.  Musculoskeletal:        General: No tenderness or edema. Normal range of motion.     Cervical back: Normal range of motion and neck supple.  Lymphadenopathy:    She has no cervical adenopathy.  Neurological: She is alert and oriented to person, place, and time. She has normal reflexes. No cranial nerve deficit. She exhibits normal muscle tone. Coordination normal.  Skin: Skin is warm. No rash noted. She is not diaphoretic. No erythema. No pallor.  Psychiatric: She has a normal mood and affect. Her behavior is normal. Judgment and thought content normal.  Vitals reviewed.         Assessment & Plan:  Bilateral carpal tunnel syndrome - Plan: Ambulatory referral to Hand Surgery  Dry mouth, unspecified - Plan: Sjogren's syndrome antibods(ssa + ssb)  General medical exam  Pure hypercholesterolemia  Benign hypertensive heart disease without heart failure  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  Given her symptoms of carpal tunnel syndrome, the patient would like to see a hand specialist to discuss treatment options.  She is already tried  night splints and she prefers to see the hand specialist prior to trying cortisone injections in the wrist.  Regarding her dry mouth, I will check antibody levels for possible Sjogren's disease.  Regarding her physical exam, she will schedule her colonoscopy with her gynecologist.  Her mammogram is up-to-date.  She denies any falls, depression, or memory loss.  Her lab work is outstanding.  Her blood pressure if anything is low.  Therefore I recommended that she temporarily discontinue her hydrochlorothiazide as this does not appear to be necessary at the present time.  I also recommended that she stop Allegra.  Regardless of whether she has Sjogren's disease, an antihistamine is certainly not helping her dry mouth.  She can use Flonase instead.  Regular anticipatory guidance is provided.

## 2020-04-14 LAB — COMPREHENSIVE METABOLIC PANEL
AG Ratio: 1.8 (calc) (ref 1.0–2.5)
ALT: 15 U/L (ref 6–29)
AST: 19 U/L (ref 10–35)
Albumin: 4.2 g/dL (ref 3.6–5.1)
Alkaline phosphatase (APISO): 55 U/L (ref 37–153)
CO2: 29 mmol/L (ref 20–32)
Calcium: 9.5 mg/dL (ref 8.6–10.4)
Creat: 0.63 mg/dL (ref 0.60–0.93)
Globulin: 2.4 g/dL (calc) (ref 1.9–3.7)
Glucose, Bld: 84 mg/dL (ref 65–99)
Potassium: 4.4 mmol/L (ref 3.5–5.3)
Sodium: 138 mmol/L (ref 135–146)
Total Bilirubin: 0.6 mg/dL (ref 0.2–1.2)
Total Protein: 6.6 g/dL (ref 6.1–8.1)

## 2020-04-14 LAB — LIPID PANEL
Cholesterol: 151 mg/dL (ref <200)
HDL: 68 mg/dL (ref >=50)
LDL Cholesterol (Calc): 67 mg/dL (calc)
Non-HDL Cholesterol (Calc): 83 mg/dL (calc) (ref <130)
Total CHOL/HDL Ratio: 2.2 (calc) (ref <5.0)
Triglycerides: 77 mg/dL (ref <150)

## 2020-04-14 LAB — CBC WITH DIFFERENTIAL/PLATELET
Absolute Monocytes: 332 cells/uL (ref 200–950)
Basophils Absolute: 12 cells/uL (ref 0–200)
Basophils Relative: 0.3 %
Eosinophils Absolute: 80 cells/uL (ref 15–500)
Eosinophils Relative: 2 %
HCT: 41.3 % (ref 35.0–45.0)
Hemoglobin: 13.6 g/dL (ref 11.7–15.5)
Lymphs Abs: 968 cells/uL (ref 850–3900)
MCH: 31.6 pg (ref 27.0–33.0)
MCHC: 32.9 g/dL (ref 32.0–36.0)
MCV: 95.8 fL (ref 80.0–100.0)
MPV: 9.9 fL (ref 7.5–12.5)
Monocytes Relative: 8.3 %
Neutro Abs: 2608 cells/uL (ref 1500–7800)
Neutrophils Relative %: 65.2 %
Platelets: 164 10*3/uL (ref 140–400)
RBC: 4.31 10*6/uL (ref 3.80–5.10)
RDW: 11.7 % (ref 11.0–15.0)
Total Lymphocyte: 24.2 %
WBC: 4 10*3/uL (ref 3.8–10.8)

## 2020-04-14 LAB — TEST AUTHORIZATION

## 2020-04-14 LAB — SJOGREN'S SYNDROME ANTIBODS(SSA + SSB)
SSA (Ro) (ENA) Antibody, IgG: <1 AI
SSB (La) (ENA) Antibody, IgG: <1 AI

## 2020-04-26 DIAGNOSIS — M19041 Primary osteoarthritis, right hand: Secondary | ICD-10-CM | POA: Diagnosis not present

## 2020-04-26 DIAGNOSIS — M19042 Primary osteoarthritis, left hand: Secondary | ICD-10-CM | POA: Diagnosis not present

## 2020-04-26 DIAGNOSIS — R200 Anesthesia of skin: Secondary | ICD-10-CM | POA: Insufficient documentation

## 2020-04-26 DIAGNOSIS — M79641 Pain in right hand: Secondary | ICD-10-CM | POA: Diagnosis not present

## 2020-04-26 DIAGNOSIS — M79642 Pain in left hand: Secondary | ICD-10-CM | POA: Insufficient documentation

## 2020-04-26 DIAGNOSIS — M180 Bilateral primary osteoarthritis of first carpometacarpal joints: Secondary | ICD-10-CM | POA: Diagnosis not present

## 2020-04-26 DIAGNOSIS — M542 Cervicalgia: Secondary | ICD-10-CM | POA: Insufficient documentation

## 2020-04-30 DIAGNOSIS — Z1211 Encounter for screening for malignant neoplasm of colon: Secondary | ICD-10-CM | POA: Diagnosis not present

## 2020-04-30 DIAGNOSIS — K219 Gastro-esophageal reflux disease without esophagitis: Secondary | ICD-10-CM | POA: Diagnosis not present

## 2020-04-30 DIAGNOSIS — R634 Abnormal weight loss: Secondary | ICD-10-CM | POA: Diagnosis not present

## 2020-05-13 ENCOUNTER — Other Ambulatory Visit: Payer: Self-pay | Admitting: Cardiovascular Disease

## 2020-05-14 ENCOUNTER — Encounter: Payer: Self-pay | Admitting: Orthopaedic Surgery

## 2020-05-14 ENCOUNTER — Ambulatory Visit: Payer: Medicare PPO | Admitting: Orthopaedic Surgery

## 2020-05-14 ENCOUNTER — Other Ambulatory Visit: Payer: Self-pay

## 2020-05-14 DIAGNOSIS — R200 Anesthesia of skin: Secondary | ICD-10-CM | POA: Diagnosis not present

## 2020-05-14 DIAGNOSIS — M47812 Spondylosis without myelopathy or radiculopathy, cervical region: Secondary | ICD-10-CM | POA: Insufficient documentation

## 2020-05-14 MED ORDER — PREDNISONE 5 MG (21) PO TBPK: ORAL_TABLET | ORAL | 0 refills | Status: DC

## 2020-05-14 NOTE — Progress Notes (Signed)
Office Visit Note   Patient: Heather Hoover           Date of Birth: Nov 16, 1946           MRN: [id number] Visit Date: 05/14/2020              Requested by: Susy Frizzle, MD [address] [place],  [place] [address] PCP: Susy Frizzle, MD   Assessment & Plan: Visit Diagnoses:  1. Bilateral hand numbness   2. Spondylosis without myelopathy or radiculopathy, cervical region     Plan: Patient has nerve conduction velocities coming up for evaluation of carpal tunnel syndrome in couple weeks Dr. Fredna Dow.  I would recommend waiting on consideration of a cervical MRI and only proceeding with cervical MRI if her electrical test do not show significant carpal tunnel compression.  She does have cervical spondylosis and considerable facet arthropathy and may have a double crush syndrome.  If nerve conduction velocities show significant or moderate carpal tunnel none I would recommend surgically treating that and then delay MRI imaging of the cervical spine only if she has persistent symptoms after carpal tunnel release.  She can call back the office of Dr. Fredna Dow tells her that the nerve conduction velocities are normal or near normal and then we can proceed with cervical spine evaluation with MRI imaging.  Pathophysiology discussed and we discussed double crush syndrome and rationale for addressing the simpler of the 2 problems of both are present first which would be the carpal tunnel release.  Follow-Up Instructions: Return in about 4 weeks (around 06/11/2020).   Orders:  No orders of the defined types were placed in this encounter.  Meds ordered this encounter  Medications  . predniSONE (STERAPRED UNI-PAK 21 TAB) 5 MG (21) TBPK tablet    Sig: Take as directed with food. 6,5,4,3,2,1    Dispense:  21 tablet    Refill:  0      Procedures: No procedures performed   Clinical Data: No additional findings.   Subjective: Chief Complaint  Patient presents with  . Neck -  Pain    HPI 74 year old female referred by Dr. Fredna Dow with neck pain, spondylosis and bilateral hand numbness.  She has numbness primarily radial 3 fingers right and left hand somewhat equal.  Hands wake her up at night she shakes her hands.  She is worn wrist splints sometimes seems to help other times she wakes up and takes the splint off.  She is noted dropping objects in the last 6 months.  She has had some neck discomfort has not noticed any weakness with pushing or pulling in her arms no gait disturbance no long track signs.  Radiographs in Dr. Levell July office cervical spine showed significant spondylosis particularly at C5 6-7 and then C5-6.  Patient had foraminal narrowing noted on oblique x-rays.  She has noticed some popping in her neck and some pain with decreased range of motion.  Review of Systems patient denies chest pain shortness of breath.  Positive history of for pulmonic stenosis and hypertension high cholesterol.  Bilateral hand numbness otherwise 14 point systems noncontributory to HPI.   Objective: Vital Signs: Ht 5\' 6"  (1.676 m)   Wt 150 lb (68 kg)   BMI 24.21 kg/m   Physical Exam Constitutional:      Appearance: She is well-developed.  HENT:     Head: Normocephalic.     Right Ear: External ear normal.     Left Ear: External  ear normal.  Eyes:     Pupils: Pupils are equal, round, and reactive to light.  Neck:     Thyroid: No thyromegaly.     Trachea: No tracheal deviation.  Cardiovascular:     Rate and Rhythm: Normal rate.  Pulmonary:     Effort: Pulmonary effort is normal.  Abdominal:     Palpations: Abdomen is soft.  Skin:    General: Skin is warm and dry.  Neurological:     Mental Status: She is alert and oriented to person, place, and time.  Psychiatric:        Behavior: Behavior normal.     Ortho Exam patient has no thenar atrophy right or left hand.  Negative, Phalen's test right and left.  Extensors are normal.  Upper extremity reflexes are 2+ and  symmetrical.  She has some tenderness to palpation first CMC joint but negative grind test.  Lower extremity reflexes knee and ankle jerk are 2+ and symmetrical no clonus.  Specialty Comments:  No specialty comments available.  Imaging: No results found.   PMFS History: Patient Active Problem List   Diagnosis Date Noted  . Bilateral hand numbness 05/14/2020  . Spondylosis without myelopathy or radiculopathy, cervical region 05/14/2020  . 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 Exogenous obesity   . Dyslipidemia   . Arthritis   . 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 Hypercholesterolemia 08/17/2011  . Benign hypertensive heart disease without heart failure 08/17/2011   Past Medical History:  Diagnosis Date  . Arthritis   . Bone spur   . DES exposure in utero   . Dyslipidemia   . Exogenous obesity   . Heart murmur   . Hypertension   . 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 Osteopenia 12/2018   T score -1.6 FRAX 10% / 1.5%    Family History  Problem Relation Age of Onset  . Hypertension Mother   . Breast cancer Mother 28       Breast   . Cancer Mother        uterine thye think  . Cancer Father 59       prostate metastized to bone  . Hypertension Father   . Arthritis Father   . Hypertension Sister   . Breast cancer Sister 66  . Cancer Sister 28       uterine  . Heart failure Sister   . Muscular dystrophy Brother     Past Surgical History:  Procedure Laterality Date  . ABDOMINAL HYSTERECTOMY  02/07/1981   RSO  . APPENDECTOMY    . BREAST SURGERY    . CARDIAC CATHETERIZATION     when in 3rd grade and age 3  . childbirth     x 2 vaginal deliveries  . COSMETIC SURGERY    . forehead plastic surgery     MOH's surgery  . FRACTURE SURGERY    . KNEE ARTHROSCOPY     right  . OOPHORECTOMY     RSO   Social History   Occupational History  . Not on file  Tobacco Use  . Smoking status: Never Smoker  . Smokeless  tobacco: Never Used  Substance and Sexual Activity  . Alcohol use: Yes    Comment: Occas  . Drug use: No  . Sexual activity: Not Currently    Birth control/protection: Post-menopausal    Comment: 1st intercourse 61 yo-1 partner

## 2020-06-05 DIAGNOSIS — G5603 Carpal tunnel syndrome, bilateral upper limbs: Secondary | ICD-10-CM | POA: Diagnosis not present

## 2020-06-20 ENCOUNTER — Telehealth: Payer: Self-pay | Admitting: Orthopaedic Surgery

## 2020-06-20 NOTE — Telephone Encounter (Signed)
Pt called stating she has carpal tunnel in both hands and from her understanding they wouldn't be proceeding if that was the case but she still has an appt so she would like someone to reach out to discuss.  [phone]

## 2020-06-20 NOTE — Telephone Encounter (Signed)
I called left message. Mod pos CTS by NCV . Right injected by Merlyn Lot. She has ROV with him in 2 wks for possible left CTS injection. If not better after he finishes injection vs surgery she will call us about getting cervical MRI.

## 2020-06-20 NOTE — Telephone Encounter (Signed)
Could you please advise? It looks like the patient has seen Dr. Merlyn Lot since her appointment with you.  Should she direct questions to his office?

## 2020-06-25 ENCOUNTER — Ambulatory Visit: Payer: Medicare PPO | Admitting: Orthopaedic Surgery

## 2020-07-03 DIAGNOSIS — G5603 Carpal tunnel syndrome, bilateral upper limbs: Secondary | ICD-10-CM | POA: Diagnosis not present

## 2020-07-11 DIAGNOSIS — L821 Other seborrheic keratosis: Secondary | ICD-10-CM | POA: Diagnosis not present

## 2020-07-11 DIAGNOSIS — Z85828 Personal history of other malignant neoplasm of skin: Secondary | ICD-10-CM | POA: Diagnosis not present

## 2020-07-11 DIAGNOSIS — L570 Actinic keratosis: Secondary | ICD-10-CM | POA: Diagnosis not present

## 2020-07-11 DIAGNOSIS — D1801 Hemangioma of skin and subcutaneous tissue: Secondary | ICD-10-CM | POA: Diagnosis not present

## 2020-07-11 DIAGNOSIS — B353 Tinea pedis: Secondary | ICD-10-CM | POA: Diagnosis not present

## 2020-07-11 DIAGNOSIS — L814 Other melanin hyperpigmentation: Secondary | ICD-10-CM | POA: Diagnosis not present

## 2020-07-15 DIAGNOSIS — D128 Benign neoplasm of rectum: Secondary | ICD-10-CM | POA: Diagnosis not present

## 2020-07-15 DIAGNOSIS — K621 Rectal polyp: Secondary | ICD-10-CM | POA: Diagnosis not present

## 2020-07-15 DIAGNOSIS — Z1211 Encounter for screening for malignant neoplasm of colon: Secondary | ICD-10-CM | POA: Diagnosis not present

## 2020-07-15 LAB — HM COLONOSCOPY

## 2020-07-18 ENCOUNTER — Other Ambulatory Visit: Payer: Self-pay

## 2020-07-18 MED ORDER — CARVEDILOL 6.25 MG PO TABS: 6.2500 mg | ORAL_TABLET | Freq: Two times a day (BID) | ORAL | 0 refills | Status: DC

## 2020-07-19 ENCOUNTER — Encounter: Payer: Self-pay | Admitting: *Deleted

## 2020-07-30 DIAGNOSIS — H04123 Dry eye syndrome of bilateral lacrimal glands: Secondary | ICD-10-CM | POA: Diagnosis not present

## 2020-07-30 DIAGNOSIS — G43B0 Ophthalmoplegic migraine, not intractable: Secondary | ICD-10-CM | POA: Diagnosis not present

## 2020-07-30 DIAGNOSIS — H10413 Chronic giant papillary conjunctivitis, bilateral: Secondary | ICD-10-CM | POA: Diagnosis not present

## 2020-07-30 DIAGNOSIS — D3132 Benign neoplasm of left choroid: Secondary | ICD-10-CM | POA: Diagnosis not present

## 2020-07-30 DIAGNOSIS — H2513 Age-related nuclear cataract, bilateral: Secondary | ICD-10-CM | POA: Diagnosis not present

## 2020-07-31 ENCOUNTER — Other Ambulatory Visit: Payer: Self-pay | Admitting: Cardiovascular Disease

## 2020-08-16 ENCOUNTER — Other Ambulatory Visit: Payer: Self-pay | Admitting: Family Medicine

## 2020-08-27 ENCOUNTER — Other Ambulatory Visit: Payer: Self-pay

## 2020-08-27 ENCOUNTER — Ambulatory Visit: Payer: Medicare PPO | Admitting: Cardiovascular Disease

## 2020-08-27 ENCOUNTER — Encounter: Payer: Self-pay | Admitting: Cardiovascular Disease

## 2020-08-27 VITALS — BP 158/70 | HR 64 | Ht 66.0 in | Wt 153.2 lb

## 2020-08-27 DIAGNOSIS — I119 Hypertensive heart disease without heart failure: Secondary | ICD-10-CM | POA: Diagnosis not present

## 2020-08-27 DIAGNOSIS — Q221 Congenital pulmonary valve stenosis: Secondary | ICD-10-CM

## 2020-08-27 NOTE — Patient Instructions (Signed)
Medication Instructions:  Your physician recommends that you continue on your current medications as directed. Please refer to the Current Medication list given to you today.   *If you need a refill on your cardiac medications before your next appointment, please call your pharmacy*  Lab Work: NONE   Testing/Procedures: NONE  Follow-Up: At [facility], you and your health needs are our priority.  As part of our continuing mission to provide you with exceptional heart care, we have created designated Provider Care Teams.  These Care Teams include your primary Cardiologist (physician) and Advanced Practice Providers (APPs -  Physician Assistants and Nurse Practitioners) who all work together to provide you with the care you need, when you need it.  We recommend signing up for the patient portal called "MyChart".  Sign up information is provided on this After Visit Summary.  MyChart is used to connect with patients for Virtual Visits (Telemedicine).  Patients are able to view lab/test results, encounter notes, upcoming appointments, etc.  Non-urgent messages can be sent to your provider as well.   To learn more about what you can do with MyChart, go to [email or web address]://www.mychart.com.    Your next appointment:   12 month(s) You will receive a reminder letter in the mail two months in advance. If you don't receive a letter, please call our office to schedule the follow-up appointment.   The format for your next appointment:   In Person  Provider:   You may see Tiffany Perryton, MD or one of the following Advanced Practice Providers on your designated Care Team:    Luke Kilroy, PA-C  Callie Goodrich, PA-C  Jesse Cleaver, FNP    

## 2020-08-27 NOTE — Progress Notes (Signed)
Cardiology Office Note   Date:  08/27/2020   ID:  Heather Hoover, Heather Hoover [birth year], MRN [id number]  PCP:  Donita Brooks, MD  Cardiologist:   Heather Si, MD   No chief complaint on file.    History of Present Illness: Heather Hoover is a 74 y.o. female with a history of hypertension, hyperlipidemia, and congenital pulmonic stenosis who presents for follow up.  Heather Hoover was previously a patient of Heather Hoover.  She had heart catheterizations at ages 24 and 33.  Each time she reportedly had mild pulmonic stenosis.  She saw Heather Hoover and reported chest heaviness after the death of her sister.  She also reported increased shortness of breath.  She had an echo 05/19/16 revealed LVEF 55-60% with mild mitral regurgitation, mild aortic regurgitation and mild pulmonary stenosis. The peak gradient is 20 mmHg.  HCTZ was reduced to 12.5mg  due to low BP.  This was in the setting of losing 50lb. she had a repeat echocardiogram 02/2019 that revealed LVEF 60 to 65% with grade 2 diastolic function.  Pulmonary pressure was mildly elevated at 9.8 mmHg.  At her last appointment her BP was running low so carvedilol was reduced.  Since then hydrochlorothiazide was also reduced due to dry mouth and eyes.  She was tested for Sjogren's and it was negative.  When she checks her blood pressure at home it averages around 120/60.  She checks it quite regularly.  Lately she has been feeling well.  She is struggling with carpel tunnel.  She had cortisone injections which helped but it is bothering her again now.  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  She continues to walk a little over a mile daily.  She struggles with cataracts and floaters.  She feels OK with exercise and has no chest pain or shortness of breath.  Her diet has been mostly good.  She continues to do Weight Watchers.  She has no lower extremity edema, orthopnea, or PND.  Past Medical History:  Diagnosis  Date  . Arthritis   . Bone spur   . DES exposure in utero   . Dyslipidemia   . Exogenous obesity   . Heart murmur   . Hypertension   . 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 Osteopenia 12/2018   T score -1.6 FRAX 10% / 1.5%    Past Surgical History:  Procedure Laterality Date  . ABDOMINAL HYSTERECTOMY  02/07/1981   RSO  . APPENDECTOMY    . BREAST SURGERY    . CARDIAC CATHETERIZATION     when in 3rd grade and age 28  . childbirth     x 2 vaginal deliveries  . COSMETIC SURGERY    . forehead plastic surgery     MOH's surgery  . FRACTURE SURGERY    . KNEE ARTHROSCOPY     right  . OOPHORECTOMY     RSO     Current Outpatient Medications  Medication Sig Dispense Refill  . acetaminophen (TYLENOL) 325 MG tablet Take 650 mg by mouth every 6 (six) hours as needed for mild pain or headache.    . carvedilol (COREG) 6.25 MG tablet Take 1 tablet (6.25 mg total) by mouth 2 (two) times daily with a meal. 180 tablet 0  . Cholecalciferol (VITAMIN D-3 PO) Take 2,000 Units by mouth daily. Taking 2000 daily    . ciclopirox (LOPROX) 0.77 % cream Apply 1 application topically  daily as needed (athletes foot).     Marland Kitchen diclofenac (VOLTAREN) 75 MG EC tablet TAKE 1 TABLET(75 MG) BY MOUTH TWICE DAILY 60 tablet 5  . Famotidine (PEPCID PO) Take by mouth.    . Fexofenadine HCl (ALLEGRA PO) Take by mouth daily as needed.    . polyvinyl alcohol (LIQUIFILM TEARS) 1.4 % ophthalmic solution Place 1 drop into both eyes as needed for dry eyes.    . potassium chloride (KLOR-CON) 10 MEQ tablet TAKE 1 TABLET(10 MEQ) BY MOUTH DAILY 90 tablet 2  . rosuvastatin (CRESTOR) 20 MG tablet TAKE 1 TABLET(20 MG) BY MOUTH DAILY 90 tablet 3   No current facility-administered medications for this visit.    Allergies:   Augmentin [amoxicillin-pot clavulanate] and Niacin-lovastatin er    Social History:  The patient  reports that she has never smoked. She has never used smokeless tobacco. She  reports current alcohol use. She reports that she does not use drugs.   Family History:  The patient's family history includes Arthritis in her father; Breast cancer (age of onset: 98) in her sister; Breast cancer (age of onset: 15) in her mother; Cancer in her mother; Cancer (age of onset: 14) in her sister; Cancer (age of onset: 25) in her father; Heart failure in her sister; Hypertension in her father, mother, and sister; Muscular dystrophy in her brother.    ROS:  Please see the history of present illness.   Otherwise, review of systems are positive for none.   All other systems are reviewed and negative.    PHYSICAL EXAM: VS:  BP (!) 158/70   Pulse 64   Ht 5\' 6"  (1.676 m)   Wt 153 lb 3.2 oz (69.5 kg)   SpO2 100%   BMI 24.73 kg/m  , BMI Body mass index is 24.73 kg/m. GENERAL:  Well appearing HEENT: Pupils equal round and reactive, fundi not visualized, oral mucosa unremarkable NECK:  No jugular venous distention, waveform within normal limits, carotid upstroke brisk and symmetric, no bruits, LUNGS:  Clear to auscultation bilaterally HEART:  RRR.  PMI not displaced or sustained,S1 and S2 within normal limits, no S3, no S4, no clicks, no rubs, II/VI systolic murmur at the RUSB ABD:  Flat, positive bowel sounds normal in frequency in pitch, no bruits, no rebound, no guarding, no midline pulsatile mass, no hepatomegaly, no splenomegaly EXT:  2 plus pulses throughout, no edema, no cyanosis no clubbing SKIN:  No rashes no nodules NEURO:  Cranial nerves II through XII grossly intact, motor grossly intact throughout PSYCH:  Cognitively intact, oriented to person place and time   EKG:  EKG is not ordered today. The ekg ordered 10/26/16 demonstrates sinus rhythm rate 77 bpm. 02/18/18: Sinus bradycardia.  Rate 57 bpm. 02/20/19: Sinus bradycardia.  Rate 59 bpm.  03/12/2020: Sinus rhythm.  Rate 68 bpm.   Echo 02/2019: 1. The left ventricle has normal systolic function with an ejection    fraction of 60-65%. The cavity size was normal. Left ventricular diastolic  Doppler parameters are consistent with pseudonormalization. Indeterminate  filling pressures.  2. The right ventricle has normal systolic function. The cavity was  normal. There is no increase in right ventricular wall thickness.  3. Left atrial size was mildly dilated.  4. Mild pulmonic stenosis.    Recent Labs: 04/10/2020: ALT 15; BUN 14; Creat 0.63; Hemoglobin 13.6; Platelets 164; Potassium 4.4; Sodium 138    Lipid Panel    Component Value Date/Time   CHOL 151 04/10/2020 0827  CHOL 142 02/22/2019 0829   TRIG 77 04/10/2020 0827   HDL 68 04/10/2020 0827   HDL 67 02/22/2019 0829   CHOLHDL 2.2 04/10/2020 0827   VLDL 22 04/13/2017 0820   LDLCALC 67 04/10/2020 0827      Wt Readings from Last 3 Encounters:  08/27/20 153 lb 3.2 oz (69.5 kg)  05/14/20 150 lb (68 kg)  04/12/20 150 lb (68 kg)     ASSESSMENT AND PLAN:  # Hypertension:  Blood pressure is above goal here.  However she brings a log of her blood pressure at home showing that it has been well-controlled.  She only had 1 reading over 140.  Continue carvedilol.  Hydrochlorothiazide was discontinued due to dryness.  # Mild pulmonic stenosis:  Stable on echo 02/2019.  We will repeat echo 02/2022.  # Hyperlipidemia: Continue rosuvastatin.  LDL 67 on 03/2020.   Current medicines are reviewed at length with the patient today.  The patient does not have concerns regarding medicines.  The following changes have been made:  1 year  Labs/ tests ordered today include:   No orders of the defined types were placed in this encounter.    Disposition:   FU with Bradlee Bridgers C. Duke Salvia, MD, [facility] in 1 year.      Signed, Morgan Keinath C. Duke Salvia, MD, [facility]  08/27/2020 10:47 AM    [facility]

## 2020-09-16 DIAGNOSIS — G5603 Carpal tunnel syndrome, bilateral upper limbs: Secondary | ICD-10-CM | POA: Diagnosis not present

## 2020-10-13 ENCOUNTER — Other Ambulatory Visit: Payer: Self-pay | Admitting: Cardiovascular Disease

## 2020-11-01 DIAGNOSIS — G5603 Carpal tunnel syndrome, bilateral upper limbs: Secondary | ICD-10-CM | POA: Diagnosis not present

## 2020-11-11 DIAGNOSIS — Z1231 Encounter for screening mammogram for malignant neoplasm of breast: Secondary | ICD-10-CM | POA: Diagnosis not present

## 2020-11-11 DIAGNOSIS — Z803 Family history of malignant neoplasm of breast: Secondary | ICD-10-CM | POA: Diagnosis not present

## 2020-12-12 ENCOUNTER — Encounter: Payer: Medicare Other | Admitting: Obstetrics and Gynecology

## 2020-12-31 ENCOUNTER — Other Ambulatory Visit: Payer: Self-pay

## 2020-12-31 ENCOUNTER — Encounter: Payer: Medicare PPO | Admitting: Obstetrics and Gynecology

## 2021-01-15 ENCOUNTER — Other Ambulatory Visit: Payer: Self-pay | Admitting: Orthopedic Surgery

## 2021-01-15 ENCOUNTER — Telehealth: Payer: Self-pay

## 2021-01-15 DIAGNOSIS — G5603 Carpal tunnel syndrome, bilateral upper limbs: Secondary | ICD-10-CM | POA: Diagnosis not present

## 2021-01-15 NOTE — Telephone Encounter (Signed)
   [facility] Pre-operative Risk Assessment    HEARTCARE STAFF: - Please ensure there is not already an duplicate clearance open for this procedure. - Under Visit Info/Reason for Call, type in Other and utilize the format Clearance MM/DD/YY or Clearance TBD. Do not use dashes or single digits. - If request is for dental extraction, please clarify the # of teeth to be extracted.  Request for surgical clearance:  1. What type of surgery is being performed? Left Carpal Tunnel Syndrome   2. When is this surgery scheduled? 02/06/2021   3. What type of clearance is required (medical clearance vs. Pharmacy clearance to hold med vs. Both)? Medical  4. Are there any medications that need to be held prior to surgery and how long?None   5. Practice name and name of physician performing [facility]? [facility], Dr. Daryll Brod   6. What is the office phone number? [phone]   7.   What is the office fax number? [phone]  8.   Anesthesia type (None, local, MAC, general) ? IV Regional /forearm Block   Heather Hoover 01/15/2021, 5:25 PM  _________________________________________________________________   (provider comments below)

## 2021-01-16 ENCOUNTER — Ambulatory Visit: Payer: Medicare PPO | Admitting: Obstetrics and Gynecology

## 2021-01-16 ENCOUNTER — Encounter: Payer: Self-pay | Admitting: Obstetrics and Gynecology

## 2021-01-16 ENCOUNTER — Other Ambulatory Visit: Payer: Self-pay

## 2021-01-16 VITALS — BP 132/78 | HR 76 | Resp 18 | Ht 63.25 in | Wt 153.4 lb

## 2021-01-16 DIAGNOSIS — Z01419 Encounter for gynecological examination (general) (routine) without abnormal findings: Secondary | ICD-10-CM

## 2021-01-16 DIAGNOSIS — Z9189 Other specified personal risk factors, not elsewhere classified: Secondary | ICD-10-CM | POA: Diagnosis not present

## 2021-01-16 DIAGNOSIS — R8290 Unspecified abnormal findings in urine: Secondary | ICD-10-CM | POA: Diagnosis not present

## 2021-01-16 NOTE — Progress Notes (Signed)
DARINA HARTWELL 19-Feb-1946 [id number]  SUBJECTIVE:  75 y.o. G2P2002 female here for a breast and pelvic exam and Pap smear. Concerned with urinary odor but no significant increase in urinary frequency or painful urination. She has no gynecologic concerns.  Current Outpatient Medications  Medication Sig Dispense Refill  . acetaminophen (TYLENOL) 325 MG tablet Take 650 mg by mouth every 6 (six) hours as needed for mild pain or headache.    . carvedilol (COREG) 6.25 MG tablet TAKE 1 TABLET(6.25 MG) BY MOUTH TWICE DAILY WITH A MEAL 180 tablet 3  . Cholecalciferol (VITAMIN D-3 PO) Take 2,000 Units by mouth daily. Taking 2000 daily    . diclofenac (VOLTAREN) 75 MG EC tablet TAKE 1 TABLET(75 MG) BY MOUTH TWICE DAILY 60 tablet 5  . Famotidine (PEPCID PO) Take by mouth.    . polyvinyl alcohol (LIQUIFILM TEARS) 1.4 % ophthalmic solution Place 1 drop into both eyes as needed for dry eyes.    . potassium chloride (KLOR-CON) 10 MEQ tablet TAKE 1 TABLET(10 MEQ) BY MOUTH DAILY 90 tablet 2  . rosuvastatin (CRESTOR) 20 MG tablet TAKE 1 TABLET(20 MG) BY MOUTH DAILY 90 tablet 3  . ciclopirox (LOPROX) 0.77 % cream Apply 1 application topically daily as needed (athletes foot).  (Patient not taking: Reported on 01/16/2021)     No current facility-administered medications for this visit.   Allergies: Augmentin [amoxicillin-pot clavulanate], Niacin-lovastatin er, and Kenalog [triamcinolone]  No LMP recorded. Patient has had a hysterectomy.  Past medical history,surgical history, problem list, medications, allergies, family history and social history were all reviewed and documented as reviewed in the EPIC chart.  GYN ROS: no abnormal bleeding, pelvic pain or discharge, no breast pain or new or enlarging lumps on self exam.  No dysuria, frequency, burning, pain with urination, cloudy/malodorous urine.   OBJECTIVE:  BP 132/78   Pulse 76   Resp 18   Ht 5' 3.25" (1.607 m)   Wt 153 lb 6.4 oz (69.6 kg)    BMI 26.96 kg/m  The patient appears well, alert, oriented, in no distress.  BREAST EXAM: breasts appear normal, no suspicious masses, no skin or nipple changes or axillary nodes  PELVIC EXAM: VULVA: normal appearing vulva with atrophic change, no masses, tenderness or lesions, VAGINA: normal appearing vagina with atrophic change, normal color and discharge, no lesions, CERVIX: surgically absent, UTERUS: surgically absent, vaginal cuff normal, ADNEXA: no masses, nontender, PAP: Pap smear done today, thin-prep method  Chaperone: Kim RN present during the examination  ASSESSMENT:  75 y.o. [id number] here for a breast and pelvic exam  PLAN:   1. Postmenopausal. Prior TVH RSO in 1982. No significant hot flashes or night sweats. No vaginal bleeding. 2. Pap smear 2020.  DES exposure in utero.  Pap smear today. 3. Urinary odor. Admits to mild urinary incontinence. Encouraged her to practice Kegel exercises regularly and to follow-up if any ongoing bothersome symptoms. We will check urinalysis today to rule out cystitis. 4. Mammogram 2021 reported per patient.  Normal breast exam today. Will continue with annual mammograms. 5. Colonoscopy 2021 per patient.  Follows with her primary doctor for colon cancer screening recommendations. 6. Osteopenia.  DEXA 2020 T score -1.4.  Next DEXA recommended now so she plans to schedule this.  7. Health maintenance.  No labs today as she normally has these completed with her primary care doctor.  The patient is aware that I will only be at this practice until early March 2022 so she knows to  make sure she requests follow-up on any results if any testing is completed when I am no longer at the practice.  Return annually or sooner, prn.  Theresia Majors MD 01/16/21

## 2021-01-16 NOTE — Telephone Encounter (Signed)
   Primary Cardiologist: Chilton Si, MD  Chart reviewed as part of pre-operative protocol coverage. Given past medical history and time since last visit, based on ACC/AHA guidelines, Heather Hoover would be at acceptable risk for the planned procedure without further cardiovascular testing.   Her RCRI is a class II risk, 0.9% risk of major cardiac event.  She is able to complete greater than 4 METS of physical activity.  Patient was advised that if she develops new symptoms prior to surgery to contact our office to arrange a follow-up appointment.  He verbalized understanding.  I will route this recommendation to the requesting party via Epic fax function and remove from pre-op pool.  Please call with questions.  Thomasene Ripple. Jonie Burdell NP-C    01/16/2021, 8:18 AM [facility] [address] Office [phone] Fax [phone]

## 2021-01-17 LAB — PAP IG W/ RFLX HPV ASCU

## 2021-01-18 LAB — URINALYSIS, COMPLETE W/RFL CULTURE
Bacteria, UA: NONE SEEN /HPF
Bilirubin Urine: NEGATIVE
Glucose, UA: NEGATIVE
Hgb urine dipstick: NEGATIVE
Hyaline Cast: NONE SEEN /LPF
Ketones, ur: NEGATIVE
Nitrites, Initial: NEGATIVE
Protein, ur: NEGATIVE
RBC / HPF: NONE SEEN /HPF (ref 0–2)
Specific Gravity, Urine: 1.015 (ref 1.001–1.03)
Squamous Epithelial / HPF: NONE SEEN /HPF (ref <=5)
WBC, UA: NONE SEEN /HPF (ref 0–5)
pH: <=5 (ref 5.0–8.0)

## 2021-01-18 LAB — URINE CULTURE
MICRO NUMBER:: 11467121
Result:: NO GROWTH
SPECIMEN QUALITY:: ADEQUATE

## 2021-01-18 LAB — CULTURE INDICATED

## 2021-01-21 ENCOUNTER — Encounter: Payer: Self-pay | Admitting: Obstetrics and Gynecology

## 2021-01-29 ENCOUNTER — Encounter (HOSPITAL_BASED_OUTPATIENT_CLINIC_OR_DEPARTMENT_OTHER): Payer: Self-pay | Admitting: Orthopedic Surgery

## 2021-01-29 ENCOUNTER — Other Ambulatory Visit: Payer: Self-pay

## 2021-02-03 ENCOUNTER — Other Ambulatory Visit (HOSPITAL_COMMUNITY)
Admission: RE | Admit: 2021-02-03 | Discharge: 2021-02-03 | Disposition: A | Discharge status: Home / Self Care | Payer: Medicare PPO | Source: Ambulatory Visit | Attending: Orthopedic Surgery | Admitting: Orthopedic Surgery

## 2021-02-03 DIAGNOSIS — Z01812 Encounter for preprocedural laboratory examination: Secondary | ICD-10-CM | POA: Insufficient documentation

## 2021-02-03 DIAGNOSIS — Z20822 Contact with and (suspected) exposure to covid-19: Secondary | ICD-10-CM | POA: Insufficient documentation

## 2021-02-03 LAB — SARS CORONAVIRUS 2 (TAT 6-24 HRS): SARS Coronavirus 2: NEGATIVE

## 2021-02-06 ENCOUNTER — Ambulatory Visit (HOSPITAL_BASED_OUTPATIENT_CLINIC_OR_DEPARTMENT_OTHER)
Admission: RE | Admit: 2021-02-06 | Discharge: 2021-02-06 | Disposition: A | Discharge status: Home / Self Care | Payer: Medicare PPO | Attending: Orthopedic Surgery | Admitting: Orthopedic Surgery

## 2021-02-06 ENCOUNTER — Encounter (HOSPITAL_BASED_OUTPATIENT_CLINIC_OR_DEPARTMENT_OTHER)
Admission: RE | Disposition: A | Discharge status: Home / Self Care | Payer: Self-pay | Source: Home / Self Care | Attending: Orthopedic Surgery

## 2021-02-06 ENCOUNTER — Ambulatory Visit (HOSPITAL_BASED_OUTPATIENT_CLINIC_OR_DEPARTMENT_OTHER): Payer: Medicare PPO | Admitting: Certified Registered"

## 2021-02-06 ENCOUNTER — Encounter (HOSPITAL_BASED_OUTPATIENT_CLINIC_OR_DEPARTMENT_OTHER): Payer: Self-pay | Admitting: Orthopedic Surgery

## 2021-02-06 ENCOUNTER — Other Ambulatory Visit: Payer: Self-pay

## 2021-02-06 DIAGNOSIS — M1990 Unspecified osteoarthritis, unspecified site: Secondary | ICD-10-CM | POA: Insufficient documentation

## 2021-02-06 DIAGNOSIS — G5602 Carpal tunnel syndrome, left upper limb: Secondary | ICD-10-CM | POA: Insufficient documentation

## 2021-02-06 DIAGNOSIS — Z888 Allergy status to other drugs, medicaments and biological substances status: Secondary | ICD-10-CM | POA: Insufficient documentation

## 2021-02-06 DIAGNOSIS — Z90710 Acquired absence of both cervix and uterus: Secondary | ICD-10-CM | POA: Diagnosis not present

## 2021-02-06 DIAGNOSIS — Z880 Allergy status to penicillin: Secondary | ICD-10-CM | POA: Insufficient documentation

## 2021-02-06 DIAGNOSIS — Z8249 Family history of ischemic heart disease and other diseases of the circulatory system: Secondary | ICD-10-CM | POA: Diagnosis not present

## 2021-02-06 DIAGNOSIS — E785 Hyperlipidemia, unspecified: Secondary | ICD-10-CM | POA: Diagnosis not present

## 2021-02-06 DIAGNOSIS — Z881 Allergy status to other antibiotic agents status: Secondary | ICD-10-CM | POA: Insufficient documentation

## 2021-02-06 DIAGNOSIS — M8580 Other specified disorders of bone density and structure, unspecified site: Secondary | ICD-10-CM | POA: Diagnosis not present

## 2021-02-06 DIAGNOSIS — M47892 Other spondylosis, cervical region: Secondary | ICD-10-CM | POA: Diagnosis not present

## 2021-02-06 DIAGNOSIS — Z90721 Acquired absence of ovaries, unilateral: Secondary | ICD-10-CM | POA: Diagnosis not present

## 2021-02-06 DIAGNOSIS — I10 Essential (primary) hypertension: Secondary | ICD-10-CM | POA: Diagnosis not present

## 2021-02-06 DIAGNOSIS — Z8261 Family history of arthritis: Secondary | ICD-10-CM | POA: Diagnosis not present

## 2021-02-06 DIAGNOSIS — E7800 Pure hypercholesterolemia, unspecified: Secondary | ICD-10-CM | POA: Diagnosis not present

## 2021-02-06 HISTORY — PX: CARPAL TUNNEL RELEASE: SHX101

## 2021-02-06 SURGERY — CARPAL TUNNEL RELEASE
Anesthesia: Regional | Site: Wrist | Laterality: Left

## 2021-02-06 MED ORDER — PROPOFOL 10 MG/ML IV BOLUS
INTRAVENOUS | Status: AC
  Filled 2021-02-06: qty 20

## 2021-02-06 MED ORDER — FENTANYL CITRATE (PF) 100 MCG/2ML IJ SOLN
INTRAMUSCULAR | Status: DC | PRN
  Administered 2021-02-06 (×2): 25 ug via INTRAVENOUS

## 2021-02-06 MED ORDER — MIDAZOLAM HCL 2 MG/2ML IJ SOLN
INTRAMUSCULAR | Status: AC
  Filled 2021-02-06: qty 2

## 2021-02-06 MED ORDER — OXYCODONE HCL 5 MG PO TABS: 5.0000 mg | ORAL_TABLET | Freq: Once | ORAL | Status: DC | PRN

## 2021-02-06 MED ORDER — ONDANSETRON HCL 4 MG/2ML IJ SOLN: 4.0000 mg | Freq: Once | INTRAMUSCULAR | Status: DC | PRN

## 2021-02-06 MED ORDER — LIDOCAINE HCL (PF) 0.5 % IJ SOLN
INTRAMUSCULAR | Status: AC
  Filled 2021-02-06: qty 50

## 2021-02-06 MED ORDER — LIDOCAINE HCL (PF) 0.5 % IJ SOLN
INTRAMUSCULAR | Status: DC | PRN
  Administered 2021-02-06: 30 mL via INTRAVENOUS

## 2021-02-06 MED ORDER — BUPIVACAINE HCL (PF) 0.25 % IJ SOLN
INTRAMUSCULAR | Status: DC | PRN
  Administered 2021-02-06: 10 mL

## 2021-02-06 MED ORDER — LACTATED RINGERS IV SOLN: INTRAVENOUS | Status: DC

## 2021-02-06 MED ORDER — OXYCODONE HCL 5 MG/5ML PO SOLN: 5.0000 mg | Freq: Once | ORAL | Status: DC | PRN

## 2021-02-06 MED ORDER — TRAMADOL HCL 50 MG PO TABS
50.0000 mg | ORAL_TABLET | Freq: Four times a day (QID) | ORAL | 0 refills | Status: DC | PRN
Start: 2021-02-06 — End: 2021-07-04

## 2021-02-06 MED ORDER — PROPOFOL 500 MG/50ML IV EMUL
INTRAVENOUS | Status: DC | PRN
  Administered 2021-02-06: 150 ug/kg/min via INTRAVENOUS
  Administered 2021-02-06: 20 mg via INTRAVENOUS

## 2021-02-06 MED ORDER — FENTANYL CITRATE (PF) 100 MCG/2ML IJ SOLN: 25.0000 ug | INTRAMUSCULAR | Status: DC | PRN

## 2021-02-06 MED ORDER — CLINDAMYCIN PHOSPHATE 900 MG/50ML IV SOLN
900.0000 mg | INTRAVENOUS | Status: AC
  Administered 2021-02-06: 900 mg via INTRAVENOUS

## 2021-02-06 MED ORDER — CLINDAMYCIN PHOSPHATE 900 MG/50ML IV SOLN
INTRAVENOUS | Status: AC
  Filled 2021-02-06: qty 50

## 2021-02-06 MED ORDER — PROPOFOL 500 MG/50ML IV EMUL
INTRAVENOUS | Status: AC
  Filled 2021-02-06: qty 50

## 2021-02-06 MED ORDER — AMISULPRIDE (ANTIEMETIC) 5 MG/2ML IV SOLN: 10.0000 mg | Freq: Once | INTRAVENOUS | Status: DC | PRN

## 2021-02-06 MED ORDER — MIDAZOLAM HCL 5 MG/5ML IJ SOLN
INTRAMUSCULAR | Status: DC | PRN
  Administered 2021-02-06: 2 mg via INTRAVENOUS

## 2021-02-06 MED ORDER — ONDANSETRON HCL 4 MG/2ML IJ SOLN
INTRAMUSCULAR | Status: DC | PRN
  Administered 2021-02-06: 4 mg via INTRAVENOUS

## 2021-02-06 MED ORDER — FENTANYL CITRATE (PF) 100 MCG/2ML IJ SOLN
INTRAMUSCULAR | Status: AC
  Filled 2021-02-06: qty 2

## 2021-02-06 SURGICAL SUPPLY — 33 items
BLADE SURG 15 STRL LF DISP TIS (BLADE) ×1 IMPLANT
BLADE SURG 15 STRL SS (BLADE) ×1
BNDG COHESIVE 3X5 TAN STRL LF (GAUZE/BANDAGES/DRESSINGS) ×2 IMPLANT
BNDG ESMARK 4X9 LF (GAUZE/BANDAGES/DRESSINGS) IMPLANT
BNDG GAUZE ELAST 4 BULKY (GAUZE/BANDAGES/DRESSINGS) ×2 IMPLANT
CHLORAPREP W/TINT 26 (MISCELLANEOUS) ×2 IMPLANT
CORD BIPOLAR FORCEPS 12FT (ELECTRODE) ×2 IMPLANT
COVER BACK TABLE 60X90IN (DRAPES) ×2 IMPLANT
COVER MAYO STAND STRL (DRAPES) ×2 IMPLANT
COVER WAND RF STERILE (DRAPES) IMPLANT
CUFF TOURN SGL QUICK 18X4 (TOURNIQUET CUFF) ×2 IMPLANT
DRAPE EXTREMITY T 121X128X90 (DISPOSABLE) ×2 IMPLANT
DRAPE SURG 17X23 STRL (DRAPES) ×2 IMPLANT
DRSG PAD ABDOMINAL 8X10 ST (GAUZE/BANDAGES/DRESSINGS) ×2 IMPLANT
GAUZE SPONGE 4X4 12PLY STRL (GAUZE/BANDAGES/DRESSINGS) ×2 IMPLANT
GAUZE XEROFORM 1X8 LF (GAUZE/BANDAGES/DRESSINGS) ×2 IMPLANT
GLOVE SURG ORTHO LTX SZ8 (GLOVE) ×2 IMPLANT
GLOVE SURG POLYISO LF SZ6.5 (GLOVE) ×4 IMPLANT
GLOVE SURG UNDER POLY LF SZ7.5 (GLOVE) ×4 IMPLANT
GLOVE SURG UNDER POLY LF SZ8.5 (GLOVE) ×2 IMPLANT
GOWN STRL REUS W/ TWL LRG LVL3 (GOWN DISPOSABLE) ×1 IMPLANT
GOWN STRL REUS W/TWL LRG LVL3 (GOWN DISPOSABLE) ×1
GOWN STRL REUS W/TWL XL LVL3 (GOWN DISPOSABLE) ×2 IMPLANT
NEEDLE PRECISIONGLIDE 27X1.5 (NEEDLE) ×2 IMPLANT
NS IRRIG 1000ML POUR BTL (IV SOLUTION) ×2 IMPLANT
PACK BASIN DAY SURGERY FS (CUSTOM PROCEDURE TRAY) ×2 IMPLANT
STOCKINETTE 4X48 STRL (DRAPES) ×2 IMPLANT
SUT ETHILON 4 0 PS 2 18 (SUTURE) ×2 IMPLANT
SUT VICRYL 4-0 PS2 18IN ABS (SUTURE) IMPLANT
SYR BULB EAR ULCER 3OZ GRN STR (SYRINGE) ×2 IMPLANT
SYR CONTROL 10ML LL (SYRINGE) ×2 IMPLANT
TOWEL GREEN STERILE FF (TOWEL DISPOSABLE) ×2 IMPLANT
UNDERPAD 30X36 HEAVY ABSORB (UNDERPADS AND DIAPERS) ×2 IMPLANT

## 2021-02-06 NOTE — Transfer of Care (Signed)
Immediate Anesthesia Transfer of Care Note  Patient: Heather Hoover  Procedure(s) Performed: LEFT CARPAL TUNNEL RELEASE (Left Wrist)  Patient Location: PACU  Anesthesia Type:MAC and Bier block  Level of Consciousness: awake  Airway & Oxygen Therapy: Patient Spontanous Breathing and Patient connected to face mask oxygen  Post-op Assessment: Report given to RN and Post -op Vital signs reviewed and stable  Post vital signs: Reviewed and stable  Last Vitals:  Vitals Value Taken Time  BP    Temp    Pulse 57 02/06/21 1103  Resp 13 02/06/21 1103  SpO2 100 % 02/06/21 1103  Vitals shown include unvalidated device data.  Last Pain:  Vitals:   02/06/21 0908  TempSrc: Oral  PainSc: 0-No pain      Patients Stated Pain Goal: 4 (77/82/42 3536)  Complications: No complications documented.

## 2021-02-06 NOTE — Discharge Instructions (Addendum)

## 2021-02-06 NOTE — Op Note (Signed)
NAME: Heather Hoover MEDICAL RECORD NO: [id number] DATE OF BIRTH: 03-14-1946 FACILITY: [facility] LOCATION: [facility] PHYSICIAN: Nicki Reaper, MD   OPERATIVE REPORT   DATE OF PROCEDURE: 02/06/21    PREOPERATIVE DIAGNOSIS:   Carpal tunnel syndrome left hand   POSTOPERATIVE DIAGNOSIS:   Same   PROCEDURE:   Decompression median nerve left hand   SURGEON: Cindee Salt, M.D.   ASSISTANT: none   ANESTHESIA:  Bier block with sedation and Local   INTRAVENOUS FLUIDS:  Per anesthesia flow sheet.   ESTIMATED BLOOD LOSS:  Minimal.   COMPLICATIONS:  None.   SPECIMENS:  none   TOURNIQUET TIME:    Total Tourniquet Time Documented: Forearm (Left) - 23 minutes Total: Forearm (Left) - 23 minutes    DISPOSITION:  Stable to PACU.   INDICATIONS: Patient is a 75 year old female with history of numbness and tingling left hand.  This not responded to conservative treatment and nerve conduction show severe carpal tunnel syndrome.  She has elected undergo surgical decompression of the median nerve.  Prepare postoperative course been discussed along with risk complications.  She is aware that there is no guarantee to the to the surgery the possibility of infection recurrence injury to arteries nerves tendons complete relief symptoms dystrophy in preoperative area the patient seen the extremity marked by both patient and surgeon antibiotic given  OPERATIVE COURSE: Patient is brought to the operating room where form based IV regional anesthetic was carried out without difficulty.  She was prepped using ChloraPrep in the supine position with left arm free.  3-minute dry time was allowed timeout to confirm patient procedure.  A longitudinal incision was made left palm carried down through subcutaneous tissue.  Bleeders were electrocauterized with bipolar.  Palmar fascia was split.  Superficial palmar arch was identified along with the flexor tendon the ring little finger.  A very  significant tenosynovitis was present.  Significant fluid production.  Retractors were placed retracting median nerve radially ulnar nerve on The flexor retinaculum was then released on its ulnar border right angle and dissected C retractor was then placed between skin forearm fascia and the proximal retinaculum forearm fascia was then released release approximately 3 to 4 cm proximal to the wrist crease under direct vision.  Canal was explored.  Area compression of the nerve was apparent.  No further lesions were identified.  The wound was copiously irrigated with saline closed with interrupted 4-0 nylon sutures.  Local infiltration quarter percent bupivacaine was given approximately 10 cc was used sterile compressive dressing with the fingers 3 was applied.  Deflation of the tourniquet all fingers immediately pink.  She was taken to the recovery room for observation in satisfactory condition.  She will be discharged home to return to the [facility] in 1 week and Tylenol ibuprofen for pain with Ultram for breakthrough.   Cindee Salt, MD Electronically signed, 02/06/21

## 2021-02-06 NOTE — Anesthesia Procedure Notes (Signed)
Anesthesia Regional Block: Bier block (IV Regional)   Pre-Anesthetic Checklist: ,, timeout performed, Correct Patient, Correct Site, Correct Laterality, Correct Procedure, Correct Position, site marked, Risks and benefits discussed,  Surgical consent,  Pre-op evaluation,  At surgeon's request and post-op pain management  Laterality: Left  Prep: alcohol swabs        Procedures:,,,,, intact distal pulses, Esmarch exsanguination, single tourniquet utilized, #20gu IV placed  Narrative:  Start time: 02/06/2021 10:30 AM End time: 02/06/2021 10:31 AM
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
## 2021-02-06 NOTE — Anesthesia Postprocedure Evaluation (Signed)
Anesthesia Post Note  Patient: Heather Hoover  Procedure(s) Performed: LEFT CARPAL TUNNEL RELEASE (Left Wrist)     Patient location during evaluation: PACU Anesthesia Type: Bier Block and MAC Level of consciousness: awake and alert Pain management: pain level controlled Vital Signs Assessment: post-procedure vital signs reviewed and stable Respiratory status: spontaneous breathing and respiratory function stable Cardiovascular status: stable Postop Assessment: no apparent nausea or vomiting Anesthetic complications: no   No complications documented.  Last Vitals:  Vitals:   02/06/21 1104 02/06/21 1115  BP: (!) 141/71 (!) 150/74  Pulse:  (!) 53  Resp:  10  Temp:    SpO2:  98%    Last Pain:  Vitals:   02/06/21 1127  TempSrc:   PainSc: 0-No pain                 Merlinda Frederick

## 2021-02-06 NOTE — Anesthesia Preprocedure Evaluation (Addendum)
Anesthesia Evaluation  Patient identified by MRN, date of birth, ID band Patient awake    Reviewed: Allergy & Precautions, H&P , NPO status , Patient's Chart, lab work & pertinent test results  Airway Mallampati: II  TM Distance: >3 FB Neck ROM: Full    Dental no notable dental hx.    Pulmonary neg pulmonary ROS,    Pulmonary exam normal breath sounds clear to auscultation       Cardiovascular hypertension, Normal cardiovascular exam+ Valvular Problems/Murmurs (mild pulmonic stenosis)  Rhythm:Regular Rate:Normal   1. The left ventricle has normal systolic function with an ejection  fraction of 60-65%. The cavity size was normal. Left ventricular diastolic  Doppler parameters are consistent with pseudonormalization. Indeterminate  filling pressures.  2. The right ventricle has normal systolic function. The cavity was  normal. There is no increase in right ventricular wall thickness.  3. Left atrial size was mildly dilated.  4. Mild pulmonic stenosis.    Neuro/Psych negative neurological ROS  negative psych ROS   GI/Hepatic negative GI ROS, Neg liver ROS,   Endo/Other  negative endocrine ROS  Renal/GU negative Renal ROS  negative genitourinary   Musculoskeletal  (+) Arthritis , Osteoarthritis,    Abdominal   Peds negative pediatric ROS (+)  Hematology negative hematology ROS (+)   Anesthesia Other Findings   Reproductive/Obstetrics negative OB ROS                            Anesthesia Physical Anesthesia Plan  ASA: III  Anesthesia Plan: Bier Block and Bier Block-LIDOCAINE ONLY   Post-op Pain Management:    Induction: Intravenous  PONV Risk Score and Plan: 2 and Propofol infusion, TIVA and Treatment may vary due to age or medical condition  Airway Management Planned: Natural Airway and Simple Face Mask  Additional Equipment: None  Intra-op Plan:   Post-operative Plan:  Extubation in OR  Informed Consent: I have reviewed the patients History and Physical, chart, labs and discussed the procedure including the risks, benefits and alternatives for the proposed anesthesia with the patient or authorized representative who has indicated his/her understanding and acceptance.       Plan Discussed with: CRNA, Anesthesiologist and Surgeon  Anesthesia Plan Comments:        Anesthesia Quick Evaluation

## 2021-02-06 NOTE — Brief Op Note (Signed)
02/06/2021  10:58 AM  PATIENT:  Heather Hoover  75 y.o. female  PRE-OPERATIVE DIAGNOSIS:  LEFT CARPAL TUNNEL SYNDROME  POST-OPERATIVE DIAGNOSIS:  LEFT CARPAL TUNNEL SYNDROME  PROCEDURE:  Procedure(s) with comments: LEFT CARPAL TUNNEL RELEASE (Left) - IV REGIONAL FOREARM BLOCK 45 MINUTES  SURGEON:  Surgeon(s) and Role:    * Cindee Salt, MD - Primary  PHYSICIAN ASSISTANT:   ASSISTANTS: none   ANESTHESIA:   local, regional and IV sedation  EBL:82ml  BLOOD ADMINISTERED:none  DRAINS: none   LOCAL MEDICATIONS USED:  BUPIVICAINE   SPECIMEN:  No Specimen  DISPOSITION OF SPECIMEN:  N/A  COUNTS:  YES  TOURNIQUET:   Total Tourniquet Time Documented: Forearm (Left) - 23 minutes Total: Forearm (Left) - 23 minutes   DICTATION: .Reubin Milan Dictation  PLAN OF CARE: Discharge to home after PACU  PATIENT DISPOSITION:  PACU - hemodynamically stable.

## 2021-02-10 ENCOUNTER — Encounter (HOSPITAL_BASED_OUTPATIENT_CLINIC_OR_DEPARTMENT_OTHER): Payer: Self-pay | Admitting: Orthopedic Surgery

## 2021-02-11 ENCOUNTER — Other Ambulatory Visit: Payer: Self-pay | Admitting: Cardiovascular Disease

## 2021-04-21 ENCOUNTER — Other Ambulatory Visit: Payer: Self-pay | Admitting: Orthopedic Surgery

## 2021-04-24 ENCOUNTER — Other Ambulatory Visit: Payer: Self-pay | Admitting: Family Medicine

## 2021-05-23 ENCOUNTER — Encounter: Payer: Medicare PPO | Admitting: Family Medicine

## 2021-05-23 ENCOUNTER — Other Ambulatory Visit: Payer: Self-pay

## 2021-05-23 ENCOUNTER — Encounter (HOSPITAL_BASED_OUTPATIENT_CLINIC_OR_DEPARTMENT_OTHER): Payer: Self-pay | Admitting: Orthopedic Surgery

## 2021-06-03 ENCOUNTER — Ambulatory Visit (HOSPITAL_BASED_OUTPATIENT_CLINIC_OR_DEPARTMENT_OTHER)
Admission: RE | Admit: 2021-06-03 | Discharge: 2021-06-03 | Disposition: A | Discharge status: Home / Self Care | Payer: Medicare PPO | Attending: Orthopedic Surgery | Admitting: Orthopedic Surgery

## 2021-06-03 ENCOUNTER — Ambulatory Visit (HOSPITAL_BASED_OUTPATIENT_CLINIC_OR_DEPARTMENT_OTHER): Payer: Medicare PPO | Admitting: Anesthesiology

## 2021-06-03 ENCOUNTER — Encounter (HOSPITAL_BASED_OUTPATIENT_CLINIC_OR_DEPARTMENT_OTHER)
Admission: RE | Disposition: A | Discharge status: Home / Self Care | Payer: Self-pay | Source: Home / Self Care | Attending: Orthopedic Surgery

## 2021-06-03 ENCOUNTER — Other Ambulatory Visit: Payer: Self-pay

## 2021-06-03 ENCOUNTER — Encounter (HOSPITAL_BASED_OUTPATIENT_CLINIC_OR_DEPARTMENT_OTHER): Payer: Self-pay | Admitting: Orthopedic Surgery

## 2021-06-03 DIAGNOSIS — G5601 Carpal tunnel syndrome, right upper limb: Secondary | ICD-10-CM | POA: Insufficient documentation

## 2021-06-03 DIAGNOSIS — Z90721 Acquired absence of ovaries, unilateral: Secondary | ICD-10-CM | POA: Diagnosis not present

## 2021-06-03 DIAGNOSIS — Z8249 Family history of ischemic heart disease and other diseases of the circulatory system: Secondary | ICD-10-CM | POA: Diagnosis not present

## 2021-06-03 DIAGNOSIS — M47812 Spondylosis without myelopathy or radiculopathy, cervical region: Secondary | ICD-10-CM | POA: Diagnosis not present

## 2021-06-03 DIAGNOSIS — Z90710 Acquired absence of both cervix and uterus: Secondary | ICD-10-CM | POA: Insufficient documentation

## 2021-06-03 DIAGNOSIS — Z880 Allergy status to penicillin: Secondary | ICD-10-CM | POA: Insufficient documentation

## 2021-06-03 DIAGNOSIS — E7800 Pure hypercholesterolemia, unspecified: Secondary | ICD-10-CM | POA: Diagnosis not present

## 2021-06-03 DIAGNOSIS — Z888 Allergy status to other drugs, medicaments and biological substances status: Secondary | ICD-10-CM | POA: Diagnosis not present

## 2021-06-03 DIAGNOSIS — I119 Hypertensive heart disease without heart failure: Secondary | ICD-10-CM | POA: Diagnosis not present

## 2021-06-03 HISTORY — PX: CARPAL TUNNEL RELEASE: SHX101

## 2021-06-03 SURGERY — CARPAL TUNNEL RELEASE
Anesthesia: Regional | Site: Wrist | Laterality: Right

## 2021-06-03 MED ORDER — FENTANYL CITRATE (PF) 100 MCG/2ML IJ SOLN
INTRAMUSCULAR | Status: DC | PRN
  Administered 2021-06-03 (×2): 25 ug via INTRAVENOUS

## 2021-06-03 MED ORDER — MIDAZOLAM HCL 5 MG/5ML IJ SOLN
INTRAMUSCULAR | Status: DC | PRN
  Administered 2021-06-03: 1 mg via INTRAVENOUS

## 2021-06-03 MED ORDER — LIDOCAINE HCL (PF) 0.5 % IJ SOLN
INTRAMUSCULAR | Status: DC | PRN
  Administered 2021-06-03: 30 mL via INTRAVENOUS

## 2021-06-03 MED ORDER — PROMETHAZINE HCL 25 MG/ML IJ SOLN: 6.2500 mg | INTRAMUSCULAR | Status: DC | PRN

## 2021-06-03 MED ORDER — CLINDAMYCIN PHOSPHATE 900 MG/50ML IV SOLN
900.0000 mg | INTRAVENOUS | Status: AC
  Administered 2021-06-03: 900 mg via INTRAVENOUS

## 2021-06-03 MED ORDER — LACTATED RINGERS IV SOLN: INTRAVENOUS | Status: DC

## 2021-06-03 MED ORDER — ONDANSETRON HCL 4 MG/2ML IJ SOLN
INTRAMUSCULAR | Status: DC | PRN
  Administered 2021-06-03: 4 mg via INTRAVENOUS

## 2021-06-03 MED ORDER — BUPIVACAINE HCL (PF) 0.25 % IJ SOLN
INTRAMUSCULAR | Status: AC
  Filled 2021-06-03: qty 120

## 2021-06-03 MED ORDER — BUPIVACAINE HCL (PF) 0.25 % IJ SOLN
INTRAMUSCULAR | Status: DC | PRN
  Administered 2021-06-03: 7 mL

## 2021-06-03 MED ORDER — FENTANYL CITRATE (PF) 100 MCG/2ML IJ SOLN
INTRAMUSCULAR | Status: AC
  Filled 2021-06-03: qty 2

## 2021-06-03 MED ORDER — HYDROMORPHONE HCL 1 MG/ML IJ SOLN: 0.2500 mg | INTRAMUSCULAR | Status: DC | PRN

## 2021-06-03 MED ORDER — CLINDAMYCIN PHOSPHATE 900 MG/50ML IV SOLN
INTRAVENOUS | Status: AC
  Filled 2021-06-03: qty 50

## 2021-06-03 MED ORDER — PROPOFOL 500 MG/50ML IV EMUL
INTRAVENOUS | Status: DC | PRN
  Administered 2021-06-03: 75 ug/kg/min via INTRAVENOUS

## 2021-06-03 MED ORDER — OXYCODONE HCL 5 MG/5ML PO SOLN: 5.0000 mg | Freq: Once | ORAL | Status: DC | PRN

## 2021-06-03 MED ORDER — MIDAZOLAM HCL 2 MG/2ML IJ SOLN
INTRAMUSCULAR | Status: AC
  Filled 2021-06-03: qty 2

## 2021-06-03 MED ORDER — OXYCODONE HCL 5 MG PO TABS: 5.0000 mg | ORAL_TABLET | Freq: Once | ORAL | Status: DC | PRN

## 2021-06-03 SURGICAL SUPPLY — 33 items
APL PRP STRL LF DISP 70% ISPRP (MISCELLANEOUS) ×1
BLADE SURG 15 STRL LF DISP TIS (BLADE) ×1 IMPLANT
BLADE SURG 15 STRL SS (BLADE) ×3
BNDG CMPR 9X4 STRL LF SNTH (GAUZE/BANDAGES/DRESSINGS) ×1
BNDG COHESIVE 3X5 TAN STRL LF (GAUZE/BANDAGES/DRESSINGS) ×3 IMPLANT
BNDG ESMARK 4X9 LF (GAUZE/BANDAGES/DRESSINGS) ×3 IMPLANT
BNDG GAUZE ELAST 4 BULKY (GAUZE/BANDAGES/DRESSINGS) ×3 IMPLANT
CHLORAPREP W/TINT 26 (MISCELLANEOUS) ×3 IMPLANT
CORD BIPOLAR FORCEPS 12FT (ELECTRODE) ×3 IMPLANT
COVER BACK TABLE 60X90IN (DRAPES) ×3 IMPLANT
COVER MAYO STAND STRL (DRAPES) ×3 IMPLANT
COVER WAND RF STERILE (DRAPES) IMPLANT
CUFF TOURN SGL QUICK 18X4 (TOURNIQUET CUFF) ×3 IMPLANT
DRAPE EXTREMITY T 121X128X90 (DISPOSABLE) ×3 IMPLANT
DRAPE SURG 17X23 STRL (DRAPES) ×3 IMPLANT
DRSG PAD ABDOMINAL 8X10 ST (GAUZE/BANDAGES/DRESSINGS) ×3 IMPLANT
GAUZE SPONGE 4X4 12PLY STRL (GAUZE/BANDAGES/DRESSINGS) ×3 IMPLANT
GAUZE XEROFORM 1X8 LF (GAUZE/BANDAGES/DRESSINGS) ×3 IMPLANT
GLOVE SURG ORTHO LTX SZ8 (GLOVE) ×3 IMPLANT
GLOVE SURG UNDER POLY LF SZ8.5 (GLOVE) ×3 IMPLANT
GOWN STRL REUS W/ TWL LRG LVL3 (GOWN DISPOSABLE) ×1 IMPLANT
GOWN STRL REUS W/TWL LRG LVL3 (GOWN DISPOSABLE) ×3
GOWN STRL REUS W/TWL XL LVL3 (GOWN DISPOSABLE) ×3 IMPLANT
NEEDLE PRECISIONGLIDE 27X1.5 (NEEDLE) ×3 IMPLANT
NS IRRIG 1000ML POUR BTL (IV SOLUTION) ×3 IMPLANT
PACK BASIN DAY SURGERY FS (CUSTOM PROCEDURE TRAY) ×3 IMPLANT
STOCKINETTE 4X48 STRL (DRAPES) ×3 IMPLANT
SUT ETHILON 4 0 PS 2 18 (SUTURE) ×3 IMPLANT
SUT VICRYL 4-0 PS2 18IN ABS (SUTURE) IMPLANT
SYR BULB EAR ULCER 3OZ GRN STR (SYRINGE) ×3 IMPLANT
SYR CONTROL 10ML LL (SYRINGE) ×3 IMPLANT
TOWEL GREEN STERILE FF (TOWEL DISPOSABLE) ×3 IMPLANT
UNDERPAD 30X36 HEAVY ABSORB (UNDERPADS AND DIAPERS) ×3 IMPLANT

## 2021-06-03 NOTE — Op Note (Signed)
NAME: OTTILIA PIPPENGER MEDICAL RECORD NO: [id number] DATE OF BIRTH: 1946/12/13 FACILITY: [facility] LOCATION: [facility] PHYSICIAN: Nicki Reaper, MD   OPERATIVE REPORT   DATE OF PROCEDURE: 06/03/21    PREOPERATIVE DIAGNOSIS: Carpal tunnel syndrome right hand   POSTOPERATIVE DIAGNOSIS: Same   PROCEDURE: Decompression median nerve right hand   SURGEON: Cindee Salt, M.D.   ASSISTANT: none   ANESTHESIA:  Bier block with sedation and Local   INTRAVENOUS FLUIDS:  Per anesthesia flow sheet.   ESTIMATED BLOOD LOSS:  Minimal.   COMPLICATIONS:  None.   SPECIMENS:  none   TOURNIQUET TIME:    Total Tourniquet Time Documented: Forearm (Right) - 24 minutes Total: Forearm (Right) - 24 minutes    DISPOSITION:  Stable to PACU.   INDICATIONS: Patient is a 75 year old female with a history of bilateral carpal tunnel syndrome nerve conductions positive which is not responded to conservative treatment she has undergone decompression of her left side is admitted now for her right.  Pre-.  Postoperative course been discussed along with risks and complications.  She is aware that there is no guarantee to the surgery the possibility of infection recurrence injury to arteries nerves tendons complete relief symptoms and dystrophy.  In the preoperative area the patient is seen the extremity marked by both patient and surgeon antibiotic given  OPERATIVE COURSE: Patient is brought the operating room placed in the supine position with the right arm free.  A forearm IV regional anesthetic was carried out without difficulty under the direction the anesthesia department.  She was prepped with ChloraPrep a 3-minute dry time allowed timeout taken to confirm patient procedure.  A longitudinal incision was made in the right palm carried down through subcutaneous tissue.  Bleeders were electrocauterized with bipolar.  Palmar fascia was split.  Superficial palmar arch was identified along with  flexor tendon the ring little finger.  Retractors were placed retracting median nerve radially ulnar nerve ulnarly and the flexor retinaculum was released on its ulnar border using sharp dissection.  A right angle and sect See retractor were then placed between skin and forearm fascia.  The deep structures were dissected free.  Blunt scissors were then used to release the proximal aspect of the flexor retinaculum distal forearm fascia for approximately 3 cm proximal to the wrist crease under direct vision.  The canal was explored.  Area compression to the nerve was apparent.  Motor branch entered into muscle distally.  It had an ulnar takeoff.  Wound was irrigated with saline and skin closed with interrupted 4-0 nylon sutures.  Local infiltration quarter percent bupivacaine without epinephrine was given approximately 8 cc was used.  Sterile compressive dressing with the fingers free was applied.  Deflation of the tourniquet all fingers immediately pink.  She was taken to the recovery room for observation in satisfactory condition.  She will be discharged home to return to the [facility] in 1 week Tylenol ibuprofen for pain with Ultram for breakthrough.   Cindee Salt, MD Electronically signed, 06/03/21

## 2021-06-03 NOTE — Addendum Note (Signed)
Addendum  created 06/03/21 1109 by Ronnette Hila, CRNA   Intraprocedure Meds edited

## 2021-06-03 NOTE — Brief Op Note (Signed)
06/03/2021  9:19 AM  PATIENT:  Heather Hoover  75 y.o. female  PRE-OPERATIVE DIAGNOSIS:  RIGHT CARPAL TUNNEL SYNDROME  POST-OPERATIVE DIAGNOSIS:  RIGHT CARPAL TUNNEL SYNDROME  PROCEDURE:  Procedure(s) with comments: RIGHT CARPAL TUNNEL RELEASE (Right) - 45 MIN  SURGEON:  Surgeon(s) and Role:    * Cindee Salt, MD - Primary  PHYSICIAN ASSISTANT:   ASSISTANTS: none   ANESTHESIA:   local, regional, and IV sedation  EBL:  2 mL   BLOOD ADMINISTERED:none  DRAINS: none   LOCAL MEDICATIONS USED:  BUPIVICAINE   SPECIMEN:  No Specimen  DISPOSITION OF SPECIMEN:  N/A  COUNTS:  YES  TOURNIQUET:   Total Tourniquet Time Documented: Forearm (Right) - 24 minutes Total: Forearm (Right) - 24 minutes   DICTATION: .Dragon Dictation  PLAN OF CARE: Discharge to home after PACU  PATIENT DISPOSITION:  PACU - hemodynamically stable.

## 2021-06-03 NOTE — Transfer of Care (Signed)
Immediate Anesthesia Transfer of Care Note  Patient: Heather Hoover  Procedure(s) Performed: RIGHT CARPAL TUNNEL RELEASE (Right: Wrist)  Patient Location: PACU  Anesthesia Type:MAC and Bier block  Level of Consciousness: awake, alert  and oriented  Airway & Oxygen Therapy: Patient Spontanous Breathing  Post-op Assessment: Report given to RN and Post -op Vital signs reviewed and stable  Post vital signs: Reviewed and stable  Last Vitals:  Vitals Value Taken Time  BP    Temp    Pulse    Resp    SpO2      Last Pain:  Vitals:   06/03/21 0721  TempSrc: Oral  PainSc: 0-No pain         Complications: No notable events documented.

## 2021-06-03 NOTE — Anesthesia Preprocedure Evaluation (Signed)
Anesthesia Evaluation  Patient identified by MRN, date of birth, ID band Patient awake    Reviewed: Allergy & Precautions, H&P , NPO status , Patient's Chart, lab work & pertinent test results  Airway Mallampati: II  TM Distance: >3 FB Neck ROM: Full    Dental no notable dental hx.    Pulmonary neg pulmonary ROS,    Pulmonary exam normal breath sounds clear to auscultation       Cardiovascular hypertension, Pt. on medications Normal cardiovascular exam+ Valvular Problems/Murmurs (mild pulmonic stenosis)  Rhythm:Regular Rate:Normal   1. The left ventricle has normal systolic function with an ejection  fraction of 60-65%. The cavity size was normal. Left ventricular diastolic  Doppler parameters are consistent with pseudonormalization. Indeterminate  filling pressures.  2. The right ventricle has normal systolic function. The cavity was  normal. There is no increase in right ventricular wall thickness.  3. Left atrial size was mildly dilated.  4. Mild pulmonic stenosis.    Neuro/Psych negative neurological ROS  negative psych ROS   GI/Hepatic negative GI ROS, Neg liver ROS,   Endo/Other  negative endocrine ROS  Renal/GU negative Renal ROS  negative genitourinary   Musculoskeletal  (+) Arthritis , Osteoarthritis,    Abdominal   Peds negative pediatric ROS (+)  Hematology negative hematology ROS (+)   Anesthesia Other Findings   Reproductive/Obstetrics negative OB ROS                             Anesthesia Physical  Anesthesia Plan  ASA: III  Anesthesia Plan: Bier Block and Bier Block-LIDOCAINE ONLY   Post-op Pain Management:    Induction: Intravenous  PONV Risk Score and Plan: 2 and Propofol infusion, TIVA and Treatment may vary due to age or medical condition  Airway Management Planned: Natural Airway and Simple Face Mask  Additional Equipment: None  Intra-op Plan:    Post-operative Plan: Extubation in OR  Informed Consent: I have reviewed the patients History and Physical, chart, labs and discussed the procedure including the risks, benefits and alternatives for the proposed anesthesia with the patient or authorized representative who has indicated his/her understanding and acceptance.       Plan Discussed with: CRNA, Anesthesiologist and Surgeon  Anesthesia Plan Comments:         Anesthesia Quick Evaluation

## 2021-06-03 NOTE — Anesthesia Postprocedure Evaluation (Signed)
Anesthesia Post Note  Patient: Heather Hoover  Procedure(s) Performed: RIGHT CARPAL TUNNEL RELEASE (Right: Wrist)     Patient location during evaluation: PACU Anesthesia Type: Bier Block Level of consciousness: awake and alert Pain management: pain level controlled Vital Signs Assessment: post-procedure vital signs reviewed and stable Respiratory status: spontaneous breathing, nonlabored ventilation and respiratory function stable Cardiovascular status: blood pressure returned to baseline and stable Postop Assessment: no apparent nausea or vomiting Anesthetic complications: no   No notable events documented.  Last Vitals:  Vitals:   06/03/21 0922 06/03/21 0930  BP:  (!) 167/75  Pulse: 62 (!) 58  Resp: 12 20  Temp:  37 C  SpO2: 98% 98%    Last Pain:  Vitals:   06/03/21 0930  TempSrc:   PainSc: 0-No pain                 Lowella Curb

## 2021-06-03 NOTE — Discharge Instructions (Addendum)
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
## 2021-06-04 ENCOUNTER — Encounter (HOSPITAL_BASED_OUTPATIENT_CLINIC_OR_DEPARTMENT_OTHER): Payer: Self-pay | Admitting: Orthopedic Surgery

## 2021-06-06 ENCOUNTER — Encounter: Payer: Medicare PPO | Admitting: Family Medicine

## 2021-06-30 ENCOUNTER — Other Ambulatory Visit: Payer: Medicare PPO

## 2021-06-30 ENCOUNTER — Other Ambulatory Visit: Payer: Self-pay

## 2021-06-30 DIAGNOSIS — E7800 Pure hypercholesterolemia, unspecified: Secondary | ICD-10-CM | POA: Diagnosis not present

## 2021-06-30 DIAGNOSIS — I119 Hypertensive heart disease without heart failure: Secondary | ICD-10-CM | POA: Diagnosis not present

## 2021-07-01 LAB — COMPLETE METABOLIC PANEL WITH GFR
AG Ratio: 2.1 (calc) (ref 1.0–2.5)
ALT: 14 U/L (ref 6–29)
AST: 18 U/L (ref 10–35)
Albumin: 4.4 g/dL (ref 3.6–5.1)
Alkaline phosphatase (APISO): 50 U/L (ref 37–153)
BUN: 17 mg/dL (ref 7–25)
CO2: 32 mmol/L (ref 20–32)
Calcium: 9.7 mg/dL (ref 8.6–10.4)
Chloride: 102 mmol/L (ref 98–110)
Creat: 0.69 mg/dL (ref 0.60–1.00)
Globulin: 2.1 g/dL (calc) (ref 1.9–3.7)
Glucose, Bld: 92 mg/dL (ref 65–99)
Potassium: 4.6 mmol/L (ref 3.5–5.3)
Sodium: 139 mmol/L (ref 135–146)
Total Bilirubin: 0.5 mg/dL (ref 0.2–1.2)
Total Protein: 6.5 g/dL (ref 6.1–8.1)
eGFR: 91 mL/min/{1.73_m2} (ref >=60)

## 2021-07-01 LAB — CBC WITH DIFFERENTIAL/PLATELET
Absolute Monocytes: 314 cells/uL (ref 200–950)
Basophils Absolute: 20 cells/uL (ref 0–200)
Basophils Relative: 0.6 %
Eosinophils Absolute: 73 cells/uL (ref 15–500)
Eosinophils Relative: 2.2 %
HCT: 41.2 % (ref 35.0–45.0)
Hemoglobin: 13.4 g/dL (ref 11.7–15.5)
Lymphs Abs: 894 cells/uL (ref 850–3900)
MCH: 30.9 pg (ref 27.0–33.0)
MCHC: 32.5 g/dL (ref 32.0–36.0)
MCV: 94.9 fL (ref 80.0–100.0)
MPV: 10.3 fL (ref 7.5–12.5)
Monocytes Relative: 9.5 %
Neutro Abs: 2000 cells/uL (ref 1500–7800)
Neutrophils Relative %: 60.6 %
Platelets: 143 10*3/uL (ref 140–400)
RBC: 4.34 10*6/uL (ref 3.80–5.10)
RDW: 12.1 % (ref 11.0–15.0)
Total Lymphocyte: 27.1 %
WBC: 3.3 10*3/uL — ABNORMAL LOW (ref 3.8–10.8)

## 2021-07-01 LAB — LIPID PANEL
Cholesterol: 170 mg/dL (ref <200)
HDL: 68 mg/dL (ref >=50)
LDL Cholesterol (Calc): 81 mg/dL (calc)
Non-HDL Cholesterol (Calc): 102 mg/dL (calc) (ref <130)
Total CHOL/HDL Ratio: 2.5 (calc) (ref <5.0)
Triglycerides: 111 mg/dL (ref <150)

## 2021-07-04 ENCOUNTER — Ambulatory Visit (INDEPENDENT_AMBULATORY_CARE_PROVIDER_SITE_OTHER): Payer: Medicare PPO | Admitting: Family Medicine

## 2021-07-04 ENCOUNTER — Other Ambulatory Visit: Payer: Self-pay

## 2021-07-04 ENCOUNTER — Encounter: Payer: Self-pay | Admitting: Family Medicine

## 2021-07-04 VITALS — BP 144/68 | HR 64 | Temp 98.1°F | Resp 14 | Ht 63.25 in | Wt 158.0 lb

## 2021-07-04 DIAGNOSIS — Z0001 Encounter for general adult medical examination with abnormal findings: Secondary | ICD-10-CM

## 2021-07-04 DIAGNOSIS — Q221 Congenital pulmonary valve stenosis: Secondary | ICD-10-CM | POA: Diagnosis not present

## 2021-07-04 DIAGNOSIS — Z0000 Encounter for general adult medical examination without abnormal findings: Secondary | ICD-10-CM

## 2021-07-04 DIAGNOSIS — I119 Hypertensive heart disease without heart failure: Secondary | ICD-10-CM | POA: Diagnosis not present

## 2021-07-04 DIAGNOSIS — M25561 Pain in right knee: Secondary | ICD-10-CM | POA: Diagnosis not present

## 2021-07-04 NOTE — Progress Notes (Signed)
Subjective:    Patient ID: Heather Hoover, female    DOB: 22-Sep-1946, 75 y.o.   MRN: [id number]  HPI Patient is here today for complete physical exam.  Overall she is doing very well.  She does complain of pain in her right knee particular going up and down steps which she attributes to arthritis.  She is been taking diclofenac twice a day for this.  Otherwise she is doing well.  She denies any chest pain shortness of breath or dyspnea on exertion.  She does have a murmur secondary to pulmonic stenosis however she is asymptomatic and follows up regularly with her cardiologist.  Her gynecologist is about to retire so she would like [place] to start doing her Pap smears beginning next year.  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  Her colonoscopy was performed in 2021.  They did find a polyp however they did not recommend a repeat colonoscopy due to age.  Her bone density test was performed in 2020 and showed osteopenia.  She is due for repeat bone density either this year or next year but she prefers to wait till next year.  Immunizations are up-to-date except for the shingles vaccine Immunization History  Administered Date(s) Administered   Fluad Quad(high Dose 65+) 08/14/2019, 10/02/2020   Influenza, High Dose Seasonal PF 09/17/2017, 10/03/2018   Influenza-Unspecified 09/30/2011, 10/22/2015, 09/17/2017, 09/20/2018, 08/14/2019   PFIZER Comirnaty(Gray Top)Covid-19 Tri-Sucrose Vaccine 03/22/2021   PFIZER(Purple Top)SARS-COV-2 Vaccination 01/27/2020, 02/20/2020, 10/21/2020   Pneumococcal Conjugate-13 05/21/2014   Pneumococcal Polysaccharide-23 04/06/2017   Td 06/06/2012   Tdap 06/06/2012, 02/11/2019   Zoster, Live 10/09/2013    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    Dyslipidemia    Exogenous obesity    Heart murmur    Hypertension    Mild pulmonic stenosis by prior  echocardiogram 10/28/2010   echo   Osteopenia 12/2018   T score -1.6 FRAX 10% / 1.5%   Past Surgical History:  Procedure Laterality Date   ABDOMINAL HYSTERECTOMY  02/07/1981   RSO   APPENDECTOMY     BREAST SURGERY     CARDIAC CATHETERIZATION     when in 3rd grade and age 70   CARPAL TUNNEL RELEASE Left 02/06/2021   Procedure: LEFT CARPAL TUNNEL RELEASE;  Surgeon: Cindee Salt, MD;  Location: [facility];  Service: Orthopedics;  Laterality: Left;  IV REGIONAL FOREARM BLOCK 45 MINUTES   CARPAL TUNNEL RELEASE Right 06/03/2021   Procedure: RIGHT CARPAL TUNNEL RELEASE;  Surgeon: Cindee Salt, MD;  Location: [facility];  Service: Orthopedics;  Laterality: Right;  45 MIN   childbirth     x 2 vaginal deliveries   COSMETIC SURGERY     forehead plastic surgery     MOH's surgery   FRACTURE SURGERY     KNEE ARTHROSCOPY     right   OOPHORECTOMY     RSO   Current Outpatient Medications on File Prior to Visit  Medication Sig Dispense Refill   acetaminophen (TYLENOL) 325 MG tablet Take 650 mg by mouth every 6 (six) hours as needed for mild pain or headache.     carvedilol (COREG) 6.25 MG tablet TAKE 1 TABLET(6.25 MG) BY MOUTH TWICE DAILY WITH A MEAL 180 tablet 3   Cholecalciferol (VITAMIN D-3 PO) Take 2,000 Units by mouth daily. Taking  2000 daily     diclofenac (VOLTAREN) 75 MG EC tablet TAKE 1 TABLET(75 MG) BY MOUTH TWICE DAILY 60 tablet 5   Famotidine (PEPCID PO) Take by mouth.     polyvinyl alcohol (LIQUIFILM TEARS) 1.4 % ophthalmic solution Place 1 drop into both eyes as needed for dry eyes.     potassium chloride (KLOR-CON) 10 MEQ tablet TAKE 1 TABLET(10 MEQ) BY MOUTH DAILY 90 tablet 2   rosuvastatin (CRESTOR) 20 MG tablet TAKE 1 TABLET(20 MG) BY MOUTH DAILY 90 tablet 3   No current facility-administered medications on file prior to visit.   Allergies  Allergen Reactions   Augmentin [Amoxicillin-Pot Clavulanate] Diarrhea    Did it involve swelling of the  face/tongue/throat, SOB, or low BP? yes Did it involve sudden or severe rash/hives, skin peeling, or any reaction on the inside of your mouth or nose? no Did you need to seek medical attention at a hospital or doctor's office? no When did it last happen?      2000 If all above answers are "NO", may proceed with cephalosporin use.    Niacin-Lovastatin Er     Flushing,itching,syncope   Kenalog [Triamcinolone] Palpitations   Social History   Socioeconomic History   Marital status: Married    Spouse name: Not on file   Number of children: Not on file   Years of education: Not on file   Highest education level: Not on file  Occupational History   Not on file  Tobacco Use   Smoking status: Never   Smokeless tobacco: Never  Vaping Use   Vaping Use: Never used  Substance and Sexual Activity   Alcohol use: Yes    Comment: Occas   Drug use: No   Sexual activity: Not Currently    Birth control/protection: Post-menopausal    Comment: 1st intercourse 26 yo-1 partner  Other Topics Concern   Not on file  Social History Narrative   Not on file   Social Determinants of Health   Financial Resource Strain: Not on file  Food Insecurity: Not on file  Transportation Needs: Not on file  Physical Activity: Not on file  Stress: Not on file  Social Connections: Not on file  Intimate Partner Violence: Not on file   Family History  Problem Relation Age of Onset   Hypertension Mother    Breast cancer Mother 73       Breast    Cancer Mother        uterine thye think   Cancer Father 37       prostate metastized to bone   Hypertension Father    Arthritis Father    Hypertension Sister    Breast cancer Sister 68   Cancer Sister 81       uterine   Heart failure Sister    Muscular dystrophy Brother       Review of Systems     Objective:   Physical Exam Vitals reviewed.  Constitutional:      General: She is not in acute distress.    Appearance: Normal appearance. She is normal  weight. She is not ill-appearing, toxic-appearing or diaphoretic.  HENT:     Head: Normocephalic and atraumatic.     Right Ear: Tympanic membrane and ear canal normal.     Left Ear: Tympanic membrane and ear canal normal.     Nose: Nose normal. No congestion or rhinorrhea.     Mouth/Throat:     Mouth: Mucous membranes are moist.  Pharynx: Oropharynx is clear. No oropharyngeal exudate or posterior oropharyngeal erythema.  Eyes:     Extraocular Movements: Extraocular movements intact.     Conjunctiva/sclera: Conjunctivae normal.     Pupils: Pupils are equal, round, and reactive to light.  Neck:     Vascular: No carotid bruit.  Cardiovascular:     Rate and Rhythm: Normal rate and regular rhythm.     Heart sounds: Murmur heard.    No friction rub. No gallop.  Pulmonary:     Effort: Pulmonary effort is normal. No respiratory distress.     Breath sounds: Normal breath sounds. No stridor. No wheezing, rhonchi or rales.  Abdominal:     General: Abdomen is flat. Bowel sounds are normal. There is no distension.     Palpations: Abdomen is soft.     Tenderness: There is no abdominal tenderness. There is no right CVA tenderness, left CVA tenderness, guarding or rebound.     Hernia: No hernia is present.  Musculoskeletal:        General: Tenderness present.     Cervical back: Normal range of motion and neck supple.     Right lower leg: No edema.     Left lower leg: No edema.  Lymphadenopathy:     Cervical: No cervical adenopathy.  Skin:    Coloration: Skin is not jaundiced or pale.     Findings: No bruising, erythema, lesion or rash.  Neurological:     General: No focal deficit present.     Mental Status: She is alert and oriented to person, place, and time. Mental status is at baseline.     Cranial Nerves: No cranial nerve deficit.     Sensory: No sensory deficit.     Motor: No weakness.     Coordination: Coordination normal.     Gait: Gait normal.     Deep Tendon Reflexes:  Reflexes normal.  Psychiatric:        Mood and Affect: Mood normal.        Behavior: Behavior normal.        Thought Content: Thought content normal.        Judgment: Judgment normal.          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  I suspect osteoarthritis.  The remainder of her physical exam is normal.  She does have a history of mild pulmonic stenosis however this is asymptomatic.  CBC, CMP, lipid panel are outstanding.  Immunizations are up-to-date except for the shingles vaccine.  Mammogram is due in November.  Colonoscopy is up-to-date.  Recommended a bone density test as well as 1200 mg a day of calcium and 1000 units a day of vitamin D.  Patient elects to defer bone density test until next year.

## 2021-07-10 ENCOUNTER — Other Ambulatory Visit: Payer: Self-pay

## 2021-07-10 ENCOUNTER — Ambulatory Visit
Admission: RE | Admit: 2021-07-10 | Discharge: 2021-07-10 | Disposition: A | Discharge status: Home / Self Care | Payer: Medicare PPO | Source: Ambulatory Visit | Attending: Family Medicine | Admitting: Family Medicine

## 2021-07-10 DIAGNOSIS — M25561 Pain in right knee: Secondary | ICD-10-CM | POA: Diagnosis not present

## 2021-07-14 DIAGNOSIS — L821 Other seborrheic keratosis: Secondary | ICD-10-CM | POA: Diagnosis not present

## 2021-07-14 DIAGNOSIS — Z85828 Personal history of other malignant neoplasm of skin: Secondary | ICD-10-CM | POA: Diagnosis not present

## 2021-07-14 DIAGNOSIS — L814 Other melanin hyperpigmentation: Secondary | ICD-10-CM | POA: Diagnosis not present

## 2021-07-14 DIAGNOSIS — D1801 Hemangioma of skin and subcutaneous tissue: Secondary | ICD-10-CM | POA: Diagnosis not present

## 2021-07-31 DIAGNOSIS — H43811 Vitreous degeneration, right eye: Secondary | ICD-10-CM | POA: Diagnosis not present

## 2021-07-31 DIAGNOSIS — H2513 Age-related nuclear cataract, bilateral: Secondary | ICD-10-CM | POA: Diagnosis not present

## 2021-07-31 DIAGNOSIS — G43B0 Ophthalmoplegic migraine, not intractable: Secondary | ICD-10-CM | POA: Diagnosis not present

## 2021-07-31 DIAGNOSIS — H10413 Chronic giant papillary conjunctivitis, bilateral: Secondary | ICD-10-CM | POA: Diagnosis not present

## 2021-07-31 DIAGNOSIS — H04123 Dry eye syndrome of bilateral lacrimal glands: Secondary | ICD-10-CM | POA: Diagnosis not present

## 2021-07-31 DIAGNOSIS — D3132 Benign neoplasm of left choroid: Secondary | ICD-10-CM | POA: Diagnosis not present

## 2021-08-22 ENCOUNTER — Ambulatory Visit (HOSPITAL_BASED_OUTPATIENT_CLINIC_OR_DEPARTMENT_OTHER): Payer: Medicare PPO | Admitting: Cardiovascular Disease

## 2021-08-22 ENCOUNTER — Other Ambulatory Visit: Payer: Self-pay

## 2021-08-22 ENCOUNTER — Encounter (HOSPITAL_BASED_OUTPATIENT_CLINIC_OR_DEPARTMENT_OTHER): Payer: Self-pay | Admitting: Cardiovascular Disease

## 2021-08-22 DIAGNOSIS — E785 Hyperlipidemia, unspecified: Secondary | ICD-10-CM

## 2021-08-22 DIAGNOSIS — I119 Hypertensive heart disease without heart failure: Secondary | ICD-10-CM

## 2021-08-22 DIAGNOSIS — Q221 Congenital pulmonary valve stenosis: Secondary | ICD-10-CM | POA: Diagnosis not present

## 2021-08-22 NOTE — Assessment & Plan Note (Signed)
Stable.  She has no heart failure symptoms and is euvolemic on exam.  Repeat echo next year.

## 2021-08-22 NOTE — Assessment & Plan Note (Signed)
BP has been reasonably controlled at home.  It is mostly in the 120-130s/60-70s.  Today it is much higher both initially and on repeat.  She is going to return for a Pharm.D. appointment and bring her blood pressure machine at that time.  For now she will continue her carvedilol.  She notes that she did not tolerate higher doses and would need an alternative agent should she need better blood pressure control.  She suspects that this is whitecoat hypertension.  We will also refer her to the PREP program to the [facility] for diet and exercise.

## 2021-08-22 NOTE — Patient Instructions (Signed)
Medication Instructions:  Your physician recommends that you continue on your current medications as directed. Please refer to the Current Medication list given to you today.   *If you need a refill on your cardiac medications before your next appointment, please call your pharmacy*  Lab Work: NONE   Testing/Procedures: Your physician has requested that you have an echocardiogram. Echocardiography is a painless test that uses sound waves to create images of your heart. It provides your doctor with information about the size and shape of your heart and how well your heart's chambers and valves are working. This procedure takes approximately one hour. There are no restrictions for this procedure.  TO BE DONE IN 1 YEAR   Follow-Up: At [facility], you and your health needs are our priority.  As part of our continuing mission to provide you with exceptional heart care, we have created designated Provider Care Teams.  These Care Teams include your primary Cardiologist (physician) and Advanced Practice Providers (APPs -  Physician Assistants and Nurse Practitioners) who all work together to provide you with the care you need, when you need it.  We recommend signing up for the patient portal called "MyChart".  Sign up information is provided on this After Visit Summary.  MyChart is used to connect with patients for Virtual Visits (Telemedicine).  Patients are able to view lab/test results, encounter notes, upcoming appointments, etc.  Non-urgent messages can be sent to your provider as well.   To learn more about what you can do with MyChart, go to [email or web address].    Your next appointment:   12 month(s) AFTER ECHO   The format for your next appointment:   In Person  Provider:   Chilton Si, MD or Gillian Shields, NP  Your physician recommends that you schedule a follow-up appointment in: PHARM D AT THE [address] OR [address] OFFICE, AS SOON AS POSSILBE  MONITOR YOUR BLOOD  PRESSURE, BRING YOUR READINGS AND MACHIN TO YOUR FOLLOW UP APPOINTMENT

## 2021-08-22 NOTE — Progress Notes (Signed)
Cardiology Office Note   Date:  08/22/2021   ID:  Heather Hoover, Heather Hoover 21-Jun-1946, MRN [id number]  PCP:  Heather Brooks, MD  Cardiologist:   Heather Hoover   No chief complaint on file.    History of Present Illness: Heather Hoover is a 75 y.o. female with a history of hypertension, hyperlipidemia, and congenital pulmonic stenosis who presents for follow up.  Heather Hoover was previously a patient of Dr. Patty Hoover.  She had heart catheterizations at ages 67 and 22.  Each time she reportedly had mild pulmonic stenosis.  She saw Heather Hoover 66/2017 and reported chest heaviness after the death of her sister.  She also reported increased shortness of breath.  She had an echo 05/19/16 revealed LVEF 55-60% with mild mitral regurgitation, mild aortic regurgitation and mild pulmonary stenosis. The peak gradient is 20 mmHg.  HCTZ was reduced to 12.5mg  due to low BP.  This was in the setting of losing 50lb. She had a repeat echocardiogram 02/2019 that revealed LVEF 60 to 65% with grade 2 diastolic function.  Pulmonary pressure was mildly elevated at 9.8 mmHg.  Her BP was running low so carvedilol was reduced. Hydrochlorothiazide was also reduced due to dry mouth and eyes.  She was tested for Sjogren's and it was negative. 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  She continues to walk a little over a mile daily.  At her last appointment her blood pressure was well-controlled. Today, she reports no issues with her recent carpal tunnel release surgery. Prior to her surgery, she tried taking Kenalog but this caused palpitations. There have been no palpitations since then. At home, her blood pressure is ranging from 110-140/60-80, with her average in the 120s-130s. Typically she does not feel like she has high blood pressure, feels no symptoms. For exercise she was walking every day for 30 minutes or more. However, she has significant joint pain due to arthritis, which prevents her  from reaching the 30 minute baseline. She states her pain is mostly manageable with arthritis strength Tylenol. She has gained some weight, and continues to participate in Weight Watchers. Also, she believes her bilateral ankles are swelling regularly, but this dissipates by morning. If possible she would prefer to stay on the lower dose of carvedilol, as she has not been feeling as fatigued. She denies any chest pain, or shortness of breath. No lightheadedness, headaches, syncope, orthopnea, or PND.   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   echo   Osteopenia 12/2018   T score -1.6 FRAX 10% / 1.5%    Past Surgical History:  Procedure Laterality Date   ABDOMINAL HYSTERECTOMY  02/07/1981   RSO   APPENDECTOMY     BREAST SURGERY     CARDIAC CATHETERIZATION     when in 3rd grade and age 41   CARPAL TUNNEL RELEASE Left 02/06/2021   Procedure: LEFT CARPAL TUNNEL RELEASE;  Surgeon: Cindee Salt, MD;  Location: [facility];  Service: Orthopedics;  Laterality: Left;  IV REGIONAL FOREARM BLOCK 45 MINUTES   CARPAL TUNNEL RELEASE Right 06/03/2021   Procedure: RIGHT CARPAL TUNNEL RELEASE;  Surgeon: Cindee Salt, MD;  Location:  SUR[facility]ERY CENTER;  Service: Orthopedics;  Laterality:  Right;  45 MIN   childbirth     x 2 vaginal deliveries   COSMETIC SURGERY     forehead plastic surgery     MOH's surgery   FRACTURE SURGERY     KNEE ARTHROSCOPY     right   OOPHORECTOMY     RSO     Current Outpatient Medications  Medication Sig Dispense Refill   acetaminophen (TYLENOL) 325 MG tablet Take 650 mg by mouth every 6 (six) hours as needed for mild pain or headache.     carvedilol (COREG) 6.25 MG tablet TAKE 1 TABLET(6.25 MG) BY MOUTH TWICE DAILY WITH A MEAL 180 tablet 3   Cholecalciferol (VITAMIN D-3 PO)  Take 2,000 Units by mouth daily. Taking 2000 daily     diclofenac (VOLTAREN) 75 MG EC tablet TAKE 1 TABLET(75 MG) BY MOUTH TWICE DAILY 60 tablet 5   Famotidine (PEPCID PO) Take by mouth.     polyvinyl alcohol (LIQUIFILM TEARS) 1.4 % ophthalmic solution Place 1 drop into both eyes as needed for dry eyes.     potassium chloride (KLOR-CON) 10 MEQ tablet TAKE 1 TABLET(10 MEQ) BY MOUTH DAILY 90 tablet 2   rosuvastatin (CRESTOR) 20 MG tablet TAKE 1 TABLET(20 MG) BY MOUTH DAILY 90 tablet 3   No current facility-administered medications for this visit.    Allergies:   Augmentin [amoxicillin-pot clavulanate], Niacin-lovastatin er, and Kenalog [triamcinolone]    Social History:  The patient  reports that she has never smoked. She has never used smokeless tobacco. She reports current alcohol use. She reports that she does not use drugs.   Family History:  The patient's family history includes Arthritis in her father; Breast cancer (age of onset: 50) in her sister; Breast cancer (age of onset: 6) in her mother; Cancer in her mother; Cancer (age of onset: 29) in her sister; Cancer (age of onset: 68) in her father; Heart failure in her sister; Hypertension in her father, mother, and sister; Muscular dystrophy in her brother.    ROS:   Please see the history of present illness. (+) Joint pain (+) Bilateral LE edema in ankles All other systems are reviewed and negative.    PHYSICAL EXAM: VS:  BP (!) 190/82 (BP Location: Right Arm, Patient Position: Sitting)   Pulse 63   Ht 5\' 3"  (1.6 m)   Wt 158 lb 3.2 oz (71.8 kg)   BMI 28.02 kg/m  , BMI Body mass index is 28.02 kg/m. GENERAL:  Well appearing HEENT: Pupils equal round and reactive, fundi not visualized, oral mucosa unremarkable NECK:  No jugular venous distention, waveform within normal limits, carotid upstroke brisk and symmetric, no bruits, LUNGS:  Clear to auscultation bilaterally HEART:  RRR.  PMI not displaced or sustained,S1 and S2  within normal limits, no S3, no S4, no clicks, no rubs, II/VI systolic murmur at the RUSB ABD:  Flat, positive bowel sounds normal in frequency in pitch, no bruits, no rebound, no guarding, no midline pulsatile mass, no hepatomegaly, no splenomegaly EXT:  2 plus pulses throughout, no edema, no cyanosis no clubbing SKIN:  No rashes no nodules NEURO:  Cranial nerves II through XII grossly intact, motor grossly intact throughout PSYCH:  Cognitively intact, oriented to person place and time   EKG:   08/22/2021: Sinus rhythm. Rate 63 bpm. 03/12/2020: Sinus rhythm.  Rate 68 bpm. 02/20/19: Sinus bradycardia.  Rate 59 bpm.  02/18/18: Sinus bradycardia.  Rate 57 bpm. 10/26/16: Sinus rhythm. Rate 77 bpm.  Echo 02/2019:  1. The left ventricle has normal systolic function with an ejection  fraction of 60-65%. The cavity size was normal. Left ventricular diastolic  Doppler parameters are consistent with pseudonormalization. Indeterminate  filling pressures.   2. The right ventricle has normal systolic function. The cavity was  normal. There is no increase in right ventricular wall thickness.   3. Left atrial size was mildly dilated.   4. Mild pulmonic stenosis.    Recent Labs: 06/30/2021: ALT 14; BUN 17; Creat 0.69; Hemoglobin 13.4; Platelets 143; Potassium 4.6; Sodium 139    Lipid Panel    Component Value Date/Time   CHOL 170 06/30/2021 1150   CHOL 142 02/22/2019 0829   TRIG 111 06/30/2021 1150   HDL 68 06/30/2021 1150   HDL 67 02/22/2019 0829   CHOLHDL 2.5 06/30/2021 1150   VLDL 22 04/13/2017 0820   LDLCALC 81 06/30/2021 1150      Wt Readings from Last 3 Encounters:  08/22/21 158 lb 3.2 oz (71.8 kg)  07/04/21 158 lb (71.7 kg)  06/03/21 154 lb 8.7 oz (70.1 kg)     ASSESSMENT AND PLAN: Benign hypertensive heart disease without heart failure BP has been reasonably controlled at home.  It is mostly in the 120-130s/60-70s.  Today it is much higher both initially and on repeat.  She is going  to return for a Pharm.D. appointment and bring her blood pressure machine at that time.  For now she will continue her carvedilol.  She notes that she did not tolerate higher doses and would need an alternative agent should she need better blood pressure control.  She suspects that this is whitecoat hypertension.  We will also refer her to the PREP program to the [facility] for diet and exercise.  Dyslipidemia Lipids are well-controlled on rosuvastatin.  Pulmonic stenosis, congenital Stable.  She has no heart failure symptoms and is euvolemic on exam.  Repeat echo next year.   Current medicines are reviewed at length with the patient today.  The patient does not have concerns regarding medicines.  The following changes have been made:  1 year  Labs/ tests ordered today include:   No orders of the defined types were placed in this encounter.    Disposition:    FU with Rosangelica Pevehouse C. Duke Salvia, MD, [facility] in 1 year.   I,Mathew Stumpf,acting as a Neurosurgeon for Chilton Si, MD.,have documented all relevant documentation on the behalf of Chilton Si, MD,as directed by  Chilton Si, MD while in the presence of Chilton Si, MD.  I, Ritesh Opara C. Duke Salvia, MD have reviewed all documentation for this visit.  The documentation of the exam, diagnosis, procedures, and orders on 08/22/2021 are all accurate and complete.   Signed, Rosaire Cueto C. Duke Salvia, MD, [facility]  08/22/2021 9:12 AM    [facility]

## 2021-08-22 NOTE — Assessment & Plan Note (Signed)
Lipids are well-controlled on rosuvastatin. 

## 2021-08-26 ENCOUNTER — Ambulatory Visit: Payer: Medicare PPO | Admitting: Pharmacist

## 2021-08-26 ENCOUNTER — Other Ambulatory Visit: Payer: Self-pay

## 2021-08-26 VITALS — BP 196/82 | HR 68 | Resp 14 | Ht 64.0 in | Wt 157.8 lb

## 2021-08-26 DIAGNOSIS — I119 Hypertensive heart disease without heart failure: Secondary | ICD-10-CM

## 2021-08-26 MED ORDER — VALSARTAN 40 MG PO TABS: 40.0000 mg | ORAL_TABLET | Freq: Every day | ORAL | 2 refills | Status: DC

## 2021-08-26 NOTE — Patient Instructions (Addendum)
It was nice meeting you today!  We would like your blood pressure to be less than 130/80.  Even though your readings are high here, your home readings are close to goal  Try to limit the amount of salt you are eating.  Try to switch to a product such as Mrs Sharilyn Sites  Try to increase your physical activity as tolerated up to 30 minutes a day up to 5 days a week  Continue your carvedilol 6.25mg  twice a day  We will start a new medication called valsartan 40mg  daily  Continue to check your blood pressure at home and we will check your lab work in 1-2 weeks  Please call with any questions or send messages through myChart  , PharmD, BCACP, CDCES, CPP [facility] [address] [place], [place] Phone: [phone]; Fax: [phone] 08/26/2021 3:42 PM

## 2021-08-26 NOTE — Progress Notes (Signed)
Patient ID: Heather Hoover                 DOB: 04/02/1946                      MRN: [id number]     HPI: Heather Hoover is a 75 y.o. female referred by Dr. Duke Salvia to HTN clinic. PMH is significant forhypertension, hyperlipidemia, and congenital pulmonic stenosis.  Had carpal tunnel surgery in June and has constant knee pain.  Patient seen last week by Dr. Duke Salvia with elevated HTN.  BP much higher in office than at home.  Patient reports history of [place] HTN.    Has trialed multiple BP lowering medications in the past.  HCTZ caused dry mouth and carvedilol 12.5mg  caused fatigue. Is also concerned about being on a diuretic due to incontinence issues. Has metoprolol and triamterene/HCTZ on med list but does not remember being on these medications.  Reports her diet is high in salt.  Does not like cooking anymore so eats many premade frozen foods such as frozen pizza.  Has salt on her kitchen table and eats many salty snacks such as triscuits and saltines.  Also drinks one 8 ounce diet coke a day and mixes decaf coffee and regular coffee.    Has multiple stairs in house which exacerbates her knee pain.  Uses tylenol for pain relief which she says helps.    Has lower extremity edema in both legs.  Current HTN meds: carvedilol 6.25mg  BID Previously tried: HCTZ, triamterene/HCTZ, nebivolol, metoprolol tartrate 25mg   BP goal: <130/80  Home BP readings:   9/2: 150/69 9/3: 135/66 9/4: 138/75 9/5: 140/70, 128/74 9/6: 154/71, 133/66, 143/69  Wt Readings from Last 3 Encounters:  08/22/21 158 lb 3.2 oz (71.8 kg)  07/04/21 158 lb (71.7 kg)  06/03/21 154 lb 8.7 oz (70.1 kg)   BP Readings from Last 3 Encounters:  08/22/21 (!) 190/82  07/04/21 (!) 144/68  06/03/21 (!) 167/75   Pulse Readings from Last 3 Encounters:  08/22/21 63  07/04/21 64  06/03/21 (!) 58    Renal function: CrCl cannot be calculated (Patient's most recent lab result is older than the maximum 21 days  allowed.).  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   echo   Osteopenia 12/2018   T score -1.6 FRAX 10% / 1.5%    Current Outpatient Medications on File Prior to Visit  Medication Sig Dispense Refill   acetaminophen (TYLENOL) 325 MG tablet Take 650 mg by mouth every 6 (six) hours as needed for mild pain or headache.     carvedilol (COREG) 6.25 MG tablet TAKE 1 TABLET(6.25 MG) BY MOUTH TWICE DAILY WITH A MEAL 180 tablet 3   Cholecalciferol (VITAMIN D-3 PO) Take 2,000 Units by mouth daily. Taking 2000 daily     diclofenac (VOLTAREN) 75 MG EC tablet TAKE 1 TABLET(75 MG) BY MOUTH TWICE DAILY 60 tablet 5   Famotidine (PEPCID PO) Take by mouth.     polyvinyl alcohol (LIQUIFILM TEARS) 1.4 % ophthalmic solution Place 1 drop into both eyes as needed for dry eyes.     potassium chloride (KLOR-CON) 10 MEQ tablet TAKE 1 TABLET(10 MEQ) BY MOUTH DAILY 90 tablet 2  rosuvastatin (CRESTOR) 20 MG tablet TAKE 1 TABLET(20 MG) BY MOUTH DAILY 90 tablet 3   No current facility-administered medications on file prior to visit.    Allergies  Allergen Reactions   Augmentin [Amoxicillin-Pot Clavulanate] Diarrhea    Did it involve swelling of the face/tongue/throat, SOB, or low BP? yes Did it involve sudden or severe rash/hives, skin peeling, or any reaction on the inside of your mouth or nose? no Did you need to seek medical attention at a hospital or doctor's office? no When did it last happen?      2000 If all above answers are "NO", may proceed with cephalosporin use.    Niacin-Lovastatin Er     Flushing,itching,syncope   Kenalog [Triamcinolone] Palpitations     Assessment/Plan:  1. Hypertension -  Patient BP in room 200/90, rechecked at 196/82.  Home BP cuff reads 182/84.  Likely due to [facility] Coat HTN.  Patient diet  is very heavy in sodium. Recommended she decrease the amount of salt in diet and switch to a salt free seasoning such as Mrs Sharilyn Sites. Recommended decreasing premade processed foods as they are high in salt.  Recommended switching to solely decaf coffee.  Has referral to [facility] for weight loss. Would benefit from water exercises due to knee pain.  Recommended she try to slowly increase physical activity up to 30 minutes a day up to 5 days a week.  Patient does not believe she has ever been on an ACEi/ARB.  Is willing to start low dose valsartan once daily and will continue to monitor BP at home.  In office visits not as effective due to BP being constantly elevated so patient will report readings over phone or through myChart.  Will check BMP in 1-2 weeks.  Continue carvedilol 6.25mg  BID Start valsartan 40mg  ocne daily Check BMP in 1-2 weeks  , PharmD, BCACP, CDCES, CPP [facility] [address], [place], [place] Phone: [phone]; Fax: [phone] 08/26/2021 4:50 PM

## 2021-08-29 ENCOUNTER — Telehealth: Payer: Self-pay

## 2021-08-29 NOTE — Telephone Encounter (Signed)
Called to discuss PREP program; she does want to participate at [facility], will contact her later this month when October classes are scheduled.

## 2021-09-01 DIAGNOSIS — I119 Hypertensive heart disease without heart failure: Secondary | ICD-10-CM | POA: Diagnosis not present

## 2021-09-01 LAB — BASIC METABOLIC PANEL
BUN/Creatinine Ratio: 20 (ref 12–28)
BUN: 12 mg/dL (ref 8–27)
CO2: 26 mmol/L (ref 20–29)
Calcium: 9.4 mg/dL (ref 8.7–10.3)
Chloride: 99 mmol/L (ref 96–106)
Creatinine, Ser: 0.6 mg/dL (ref 0.57–1.00)
Glucose: 92 mg/dL (ref 65–99)
Potassium: 5.1 mmol/L (ref 3.5–5.2)
Sodium: 136 mmol/L (ref 134–144)
eGFR: 94 mL/min/{1.73_m2} (ref >59)

## 2021-09-03 ENCOUNTER — Telehealth: Payer: Self-pay | Admitting: Pharmacist

## 2021-09-03 NOTE — Telephone Encounter (Signed)
Spoke with patient, relayed normal lab results. No adverse effects since starting valsartan.  Read off recent BP readings.   Average 140/70 Other readings: 117/69, 131/73, 133/69.    Had one spike at 171/78 which she could not explain  Will continue to monitor BP at home and call if there are any issues

## 2021-09-19 ENCOUNTER — Telehealth: Payer: Self-pay | Admitting: Family Medicine

## 2021-09-19 NOTE — Telephone Encounter (Signed)
Patient left vm Stating she was coming in for cortizone shot and would like to get her flu shot at the local pharmacy this weekend. She states there will be a 20 day difference from the cortizone shot and flu shot- I called patient and told her she could ask the pharmacist since I had just retrieved her message. She thanked me for calling and said that she had googled it and it said 2 weeks between the shots. If she needs to do something different please advise.   CB# [phone]

## 2021-09-22 NOTE — Telephone Encounter (Signed)
Appointment scheduled for 10/09/2021.  Patient had flu injection on 09/20/2021.

## 2021-09-23 ENCOUNTER — Telehealth: Payer: Self-pay

## 2021-09-23 NOTE — Telephone Encounter (Signed)
Called to discuss PREP class schedule for Heather Hoover in October, she prefers to start in November as she will be traveling in October; will tentatively plan for November 8 T/Th class, will call back end of October to confirm and set up assessment visit.

## 2021-10-01 ENCOUNTER — Other Ambulatory Visit: Payer: Self-pay | Admitting: Cardiovascular Disease

## 2021-10-09 ENCOUNTER — Encounter: Payer: Self-pay | Admitting: Family Medicine

## 2021-10-09 ENCOUNTER — Ambulatory Visit: Payer: Medicare PPO | Admitting: Family Medicine

## 2021-10-09 ENCOUNTER — Other Ambulatory Visit: Payer: Self-pay

## 2021-10-09 VITALS — BP 138/84 | HR 76 | Temp 98.7°F | Resp 14 | Ht 64.0 in | Wt 156.0 lb

## 2021-10-09 DIAGNOSIS — M25561 Pain in right knee: Secondary | ICD-10-CM | POA: Diagnosis not present

## 2021-10-09 MED ORDER — MELOXICAM 15 MG PO TABS: 15.0000 mg | ORAL_TABLET | Freq: Every day | ORAL | 2 refills | Status: DC

## 2021-10-09 NOTE — Progress Notes (Signed)
Subjective:    Patient ID: Heather Hoover, female    DOB: 1946/05/11, 75 y.o.   MRN: [id number]  HPI Patient presents with recurrent pain in her right knee.  The diclofenac no longer seems to be helping.  She reports pain along the medial joint line of the right knee.  There is a slight effusion over the lateral joint line.  She has tenderness with palpation over the medial joint line and pain with standing and walking.   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   echo   Osteopenia 12/2018   T score -1.6 FRAX 10% / 1.5%   Past Surgical History:  Procedure Laterality Date   ABDOMINAL HYSTERECTOMY  02/07/1981   RSO   APPENDECTOMY     BREAST SURGERY     CARDIAC CATHETERIZATION     when in 3rd grade and age 37   CARPAL TUNNEL RELEASE Left 02/06/2021   Procedure: LEFT CARPAL TUNNEL RELEASE;  Surgeon: Cindee Salt, MD;  Location: [facility];  Service: Orthopedics;  Laterality: Left;  IV REGIONAL FOREARM BLOCK 45 MINUTES   CARPAL TUNNEL RELEASE Right 06/03/2021   Procedure: RIGHT CARPAL TUNNEL RELEASE;  Surgeon: Cindee Salt, MD;  Location: [facility];  Service: Orthopedics;  Laterality: Right;  45 MIN   childbirth     x 2 vaginal deliveries   COSMETIC SURGERY     forehead plastic surgery     MOH's surgery   FRACTURE SURGERY     KNEE ARTHROSCOPY     right   OOPHORECTOMY     RSO   Current Outpatient Medications on File Prior to Visit  Medication Sig Dispense Refill   acetaminophen (TYLENOL) 325 MG tablet Take 650 mg by mouth every 6 (six) hours as needed for mild pain or headache.     carvedilol (COREG) 6.25 MG tablet TAKE 1 TABLET(6.25 MG) BY MOUTH TWICE DAILY WITH A MEAL 180 tablet 3   Cholecalciferol (VITAMIN D-3 PO) Take 2,000 Units by mouth daily. Taking 2000 daily      EFUDEX 5 % cream SMARTSIG:Sparingly Topical Every Night     Famotidine (PEPCID PO) Take by mouth.     polyvinyl alcohol (LIQUIFILM TEARS) 1.4 % ophthalmic solution Place 1 drop into both eyes as needed for dry eyes.     potassium chloride (KLOR-CON) 10 MEQ tablet TAKE 1 TABLET(10 MEQ) BY MOUTH DAILY 90 tablet 2   rosuvastatin (CRESTOR) 20 MG tablet TAKE 1 TABLET(20 MG) BY MOUTH DAILY 90 tablet 3   valsartan (DIOVAN) 40 MG tablet Take 1 tablet (40 mg total) by mouth daily. 30 tablet 2   No current facility-administered medications on file prior to visit.   Allergies  Allergen Reactions   Augmentin [Amoxicillin-Pot Clavulanate] Diarrhea    Did it involve swelling of the face/tongue/throat, SOB, or low BP? yes Did it involve sudden or severe rash/hives, skin peeling, or any reaction on the inside of your mouth or nose? no Did you need to seek medical attention at a hospital or doctor's office? no When did it last happen?      2000 If all above answers are "NO", may proceed with cephalosporin use.    Niacin-Lovastatin Er     Flushing,itching,syncope  Kenalog [Triamcinolone] Palpitations   Social History   Socioeconomic History   Marital status: Married    Spouse name: Not on file   Number of children: Not on file   Years of education: Not on file   Highest education level: Not on file  Occupational History   Not on file  Tobacco Use   Smoking status: Never   Smokeless tobacco: Never  Vaping Use   Vaping Use: Never used  Substance and Sexual Activity   Alcohol use: Yes    Comment: Occas   Drug use: No   Sexual activity: Not Currently    Birth control/protection: Post-menopausal    Comment: 1st intercourse 52 yo-1 partner  Other Topics Concern   Not on file  Social History Narrative   Not on file   Social Determinants of Health   Financial Resource Strain: Low Risk    Difficulty of Paying Living Expenses: Not hard at all  Food Insecurity: No Food Insecurity    Worried About Programme researcher, broadcasting/film/video in the Last Year: Never true   Ran Out of Food in the Last Year: Never true  Transportation Needs: No Transportation Needs   Lack of Transportation (Medical): No   Lack of Transportation (Non-Medical): No  Physical Activity: Inactive   Days of Exercise per Week: 0 days   Minutes of Exercise per Session: 0 min  Stress: Not on file  Social Connections: Not on file  Intimate Partner Violence: Not on file   Family History  Problem Relation Age of Onset   Hypertension Mother    Breast cancer Mother 85       Breast    Cancer Mother        uterine thye think   Cancer Father 43       prostate metastized to bone   Hypertension Father    Arthritis Father    Hypertension Sister    Breast cancer Sister 54   Cancer Sister 53       uterine   Heart failure Sister    Muscular dystrophy Brother       Review of Systems     Objective:   Physical Exam Vitals reviewed.  Constitutional:      General: She is not in acute distress.    Appearance: Normal appearance. She is normal weight. She is not ill-appearing, toxic-appearing or diaphoretic.  HENT:     Head: Normocephalic and atraumatic.     Right Ear: Tympanic membrane and ear canal normal.     Left Ear: Tympanic membrane and ear canal normal.     Nose: Nose normal. No congestion or rhinorrhea.     Mouth/Throat:     Mouth: Mucous membranes are moist.     Pharynx: Oropharynx is clear. No oropharyngeal exudate or posterior oropharyngeal erythema.  Eyes:     Extraocular Movements: Extraocular movements intact.     Conjunctiva/sclera: Conjunctivae normal.     Pupils: Pupils are equal, round, and reactive to light.  Neck:     Vascular: No carotid bruit.  Cardiovascular:     Rate and Rhythm: Normal rate and regular rhythm.     Heart sounds: Murmur heard.    No friction rub. No gallop.  Pulmonary:     Effort: Pulmonary effort is normal. No respiratory distress.     Breath sounds: Normal breath  sounds. No stridor. No wheezing, rhonchi or rales.  Abdominal:     General: Abdomen is flat. Bowel sounds are normal. There is  no distension.     Palpations: Abdomen is soft.     Tenderness: There is no abdominal tenderness. There is no right CVA tenderness, left CVA tenderness, guarding or rebound.     Hernia: No hernia is present.  Musculoskeletal:        General: Tenderness present.     Cervical back: Normal range of motion and neck supple.     Right lower leg: No edema.     Left lower leg: No edema.  Lymphadenopathy:     Cervical: No cervical adenopathy.  Skin:    Coloration: Skin is not jaundiced or pale.     Findings: No bruising, erythema, lesion or rash.  Neurological:     General: No focal deficit present.     Mental Status: She is alert and oriented to person, place, and time. Mental status is at baseline.     Cranial Nerves: No cranial nerve deficit.     Sensory: No sensory deficit.     Motor: No weakness.     Coordination: Coordination normal.     Gait: Gait normal.     Deep Tendon Reflexes: Reflexes normal.  Psychiatric:        Mood and Affect: Mood normal.        Behavior: Behavior normal.        Thought Content: Thought content normal.        Judgment: Judgment normal.          Assessment & Plan:  Recurrent pain of right knee Using sterile technique, I injected the right knee with a mixture of 2 cc lidocaine, 2 cc of Marcaine, and 2 cc of 40 mg/mL Kenalog.  The patient tolerated the procedure well without complication.  Discontinue diclofenac and switch to meloxicam 15 mg a day to see if this will help more.  Encouraged her to monitor for any GI upset and to drink plenty of fluids  Obtain an x-ray of the right knee.  I suspect osteoarthritis.  The remainder of her physical exam is normal.  She does have a history of mild pulmonic stenosis however this is asymptomatic.  CBC, CMP, lipid panel are outstanding.  Immunizations are up-to-date except for the shingles  vaccine.  Mammogram is due in November.  Colonoscopy is up-to-date.  Recommended a bone density test as well as 1200 mg a day of calcium and 1000 units a day of vitamin D.  Patient elects to defer bone density test until next year.

## 2021-10-10 IMAGING — CR DG KNEE COMPLETE 4+V*R*
4 series · 4 of 4 positions shown · non-contrast
Comparison: None.

CLINICAL DATA: Right knee pain

EXAM:
RIGHT KNEE - COMPLETE 4+ VIEW

[w knee ap right]
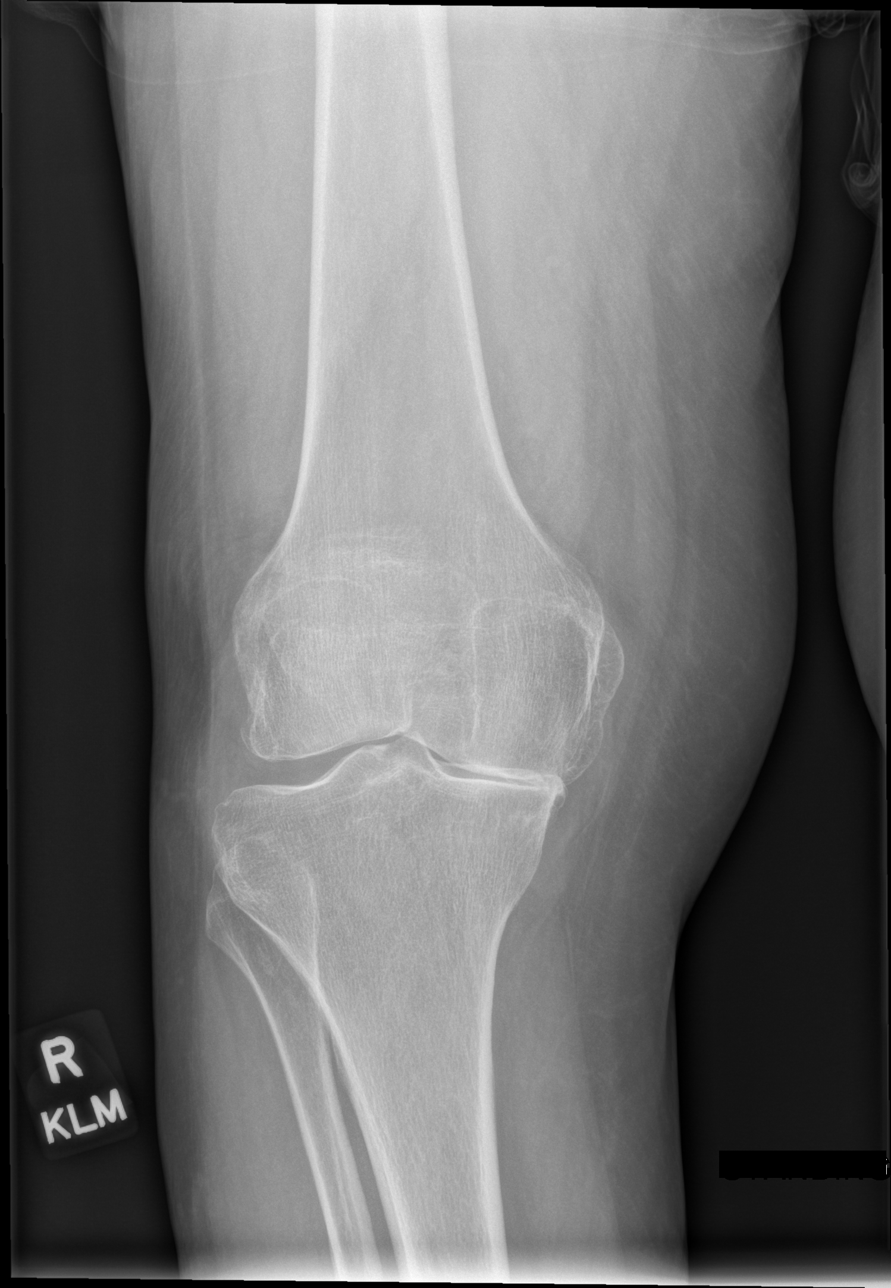

[w knee lat right]
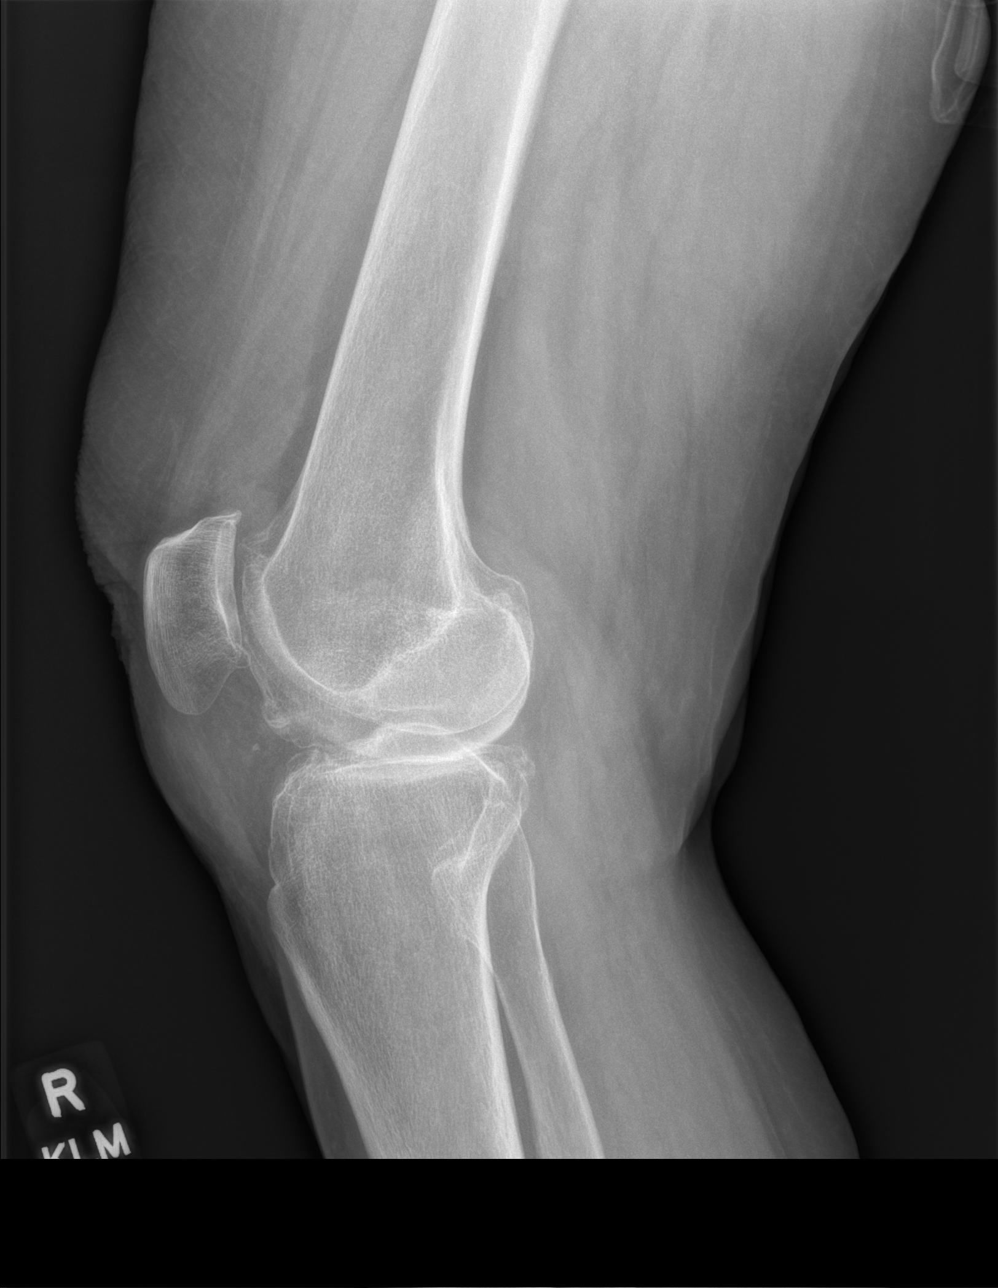

[w knee tunnel pa right]
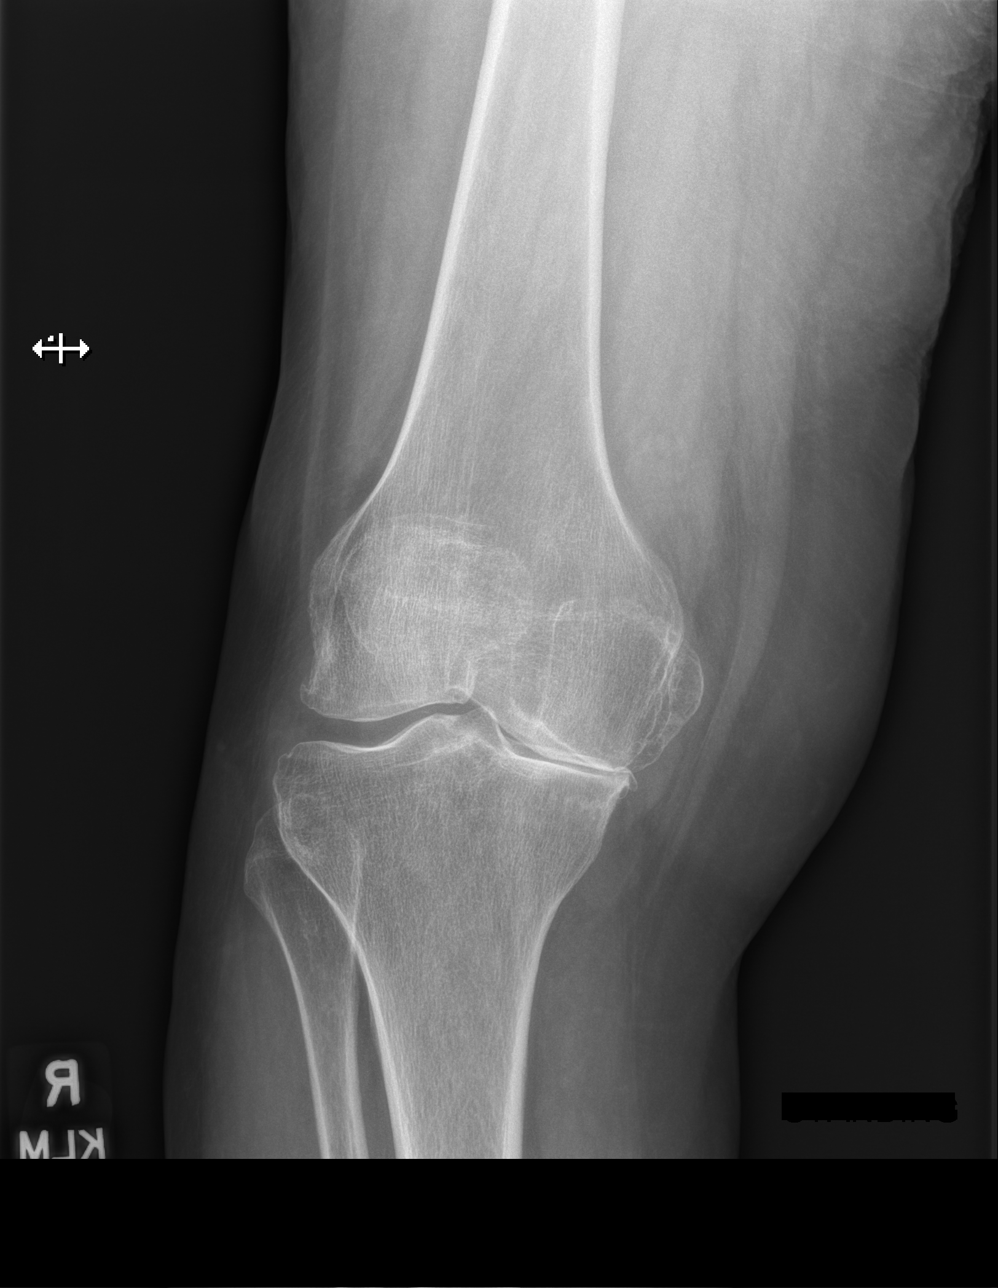

[x knee sunrise right]
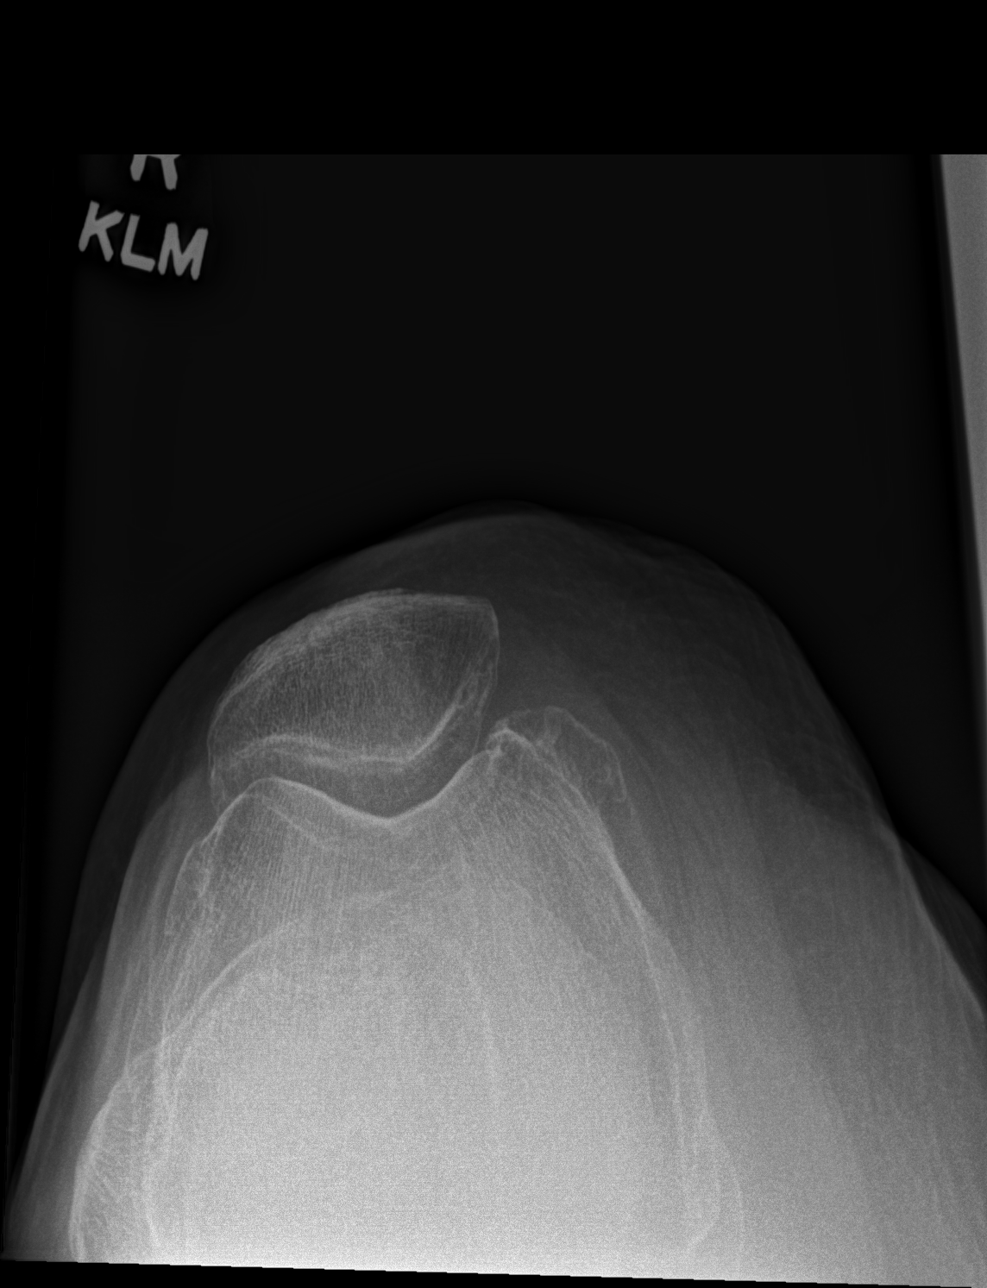

[4 of 4 positions shown; findings below may reference images not displayed]

FINDINGS: Right knee severe osteoarthritic changes most pronounced in the
medial and patellofemoral compartments, where there is marked joint
space loss, sclerosis and bony spurring. No acute osseous finding,
fracture, or large effusion. No soft tissue abnormality. Patella
located.
IMPRESSION: Severe right knee osteoarthritis, as above.

No acute osseous finding or effusion.

## 2021-10-22 ENCOUNTER — Telehealth: Payer: Self-pay

## 2021-10-22 ENCOUNTER — Other Ambulatory Visit: Payer: Self-pay | Admitting: Cardiovascular Disease

## 2021-10-22 NOTE — Telephone Encounter (Signed)
PREP assessment visit scheduled for 11/8 at 10:30am; will start PREP classes on 11/15, every T/Th 2-3:15pm

## 2021-10-28 NOTE — Progress Notes (Signed)
[facility] PREP Evaluation  Patient Details  Name: Heather Hoover MRN: [id number] Date of Birth: 18-Sep-1946 Age: 75 y.o. PCP: Donita Brooks, MD  Vitals:   10/28/21 1136  BP: 120/72  Pulse: 66  SpO2: 99%  Weight: 154 lb 9.6 oz (70.1 kg)     [facility] Eval - 10/28/21 1100       [facility] "PREP" Location   [facility] "PREP" Location [facility]      Referral    Referring Provider Duke Salvia    Reason for referral High Cholesterol;Hypertension;Inactivity    Program Start Date 11/04/21      Measurement   Waist Circumference 30.5 inches    Hip Circumference 38 inches    Body fat 41.6 percent      Information for Trainer   Goals --   Establish exercise routine, strength training   Current Exercise none    Pertinent Medical History HTN, OA    Medications that affect exercise Beta blocker      Timed Up and Go (TUGS)   Timed Up and Go Low risk <9 seconds      Mobility and Daily Activities   I find it easy to walk up or down two or more flights of stairs. 1    I have no trouble taking out the trash. 1    I do housework such as vacuuming and dusting on my own without difficulty. 2    I can easily lift a gallon of milk (8lbs). 4    I can easily walk a mile. 2    I have no trouble reaching into high cupboards or reaching down to pick up something from the floor. 2    I do not have trouble doing out-door work such as Loss adjuster, chartered, raking leaves, or gardening. 2      Mobility and Daily Activities   I feel younger than my age. 2    I feel independent. 2    I feel energetic. 2    I live an active life.  4    I feel strong. 2    I feel healthy. 2    I feel active as other people my age. 2      How fit and strong are you.   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  10/28/2010   echo   Osteopenia 12/2018   T score -1.6 FRAX 10% / 1.5%   Past Surgical History:  Procedure Laterality Date   ABDOMINAL HYSTERECTOMY  02/07/1981   RSO   APPENDECTOMY     BREAST SURGERY     CARDIAC CATHETERIZATION     when in 3rd grade and age 89   CARPAL TUNNEL RELEASE Left 02/06/2021   Procedure: LEFT CARPAL TUNNEL RELEASE;  Surgeon: Cindee Salt, MD;  Location: [facility];  Service: Orthopedics;  Laterality: Left;  IV REGIONAL FOREARM BLOCK 45 MINUTES   CARPAL TUNNEL RELEASE Right 06/03/2021   Procedure: RIGHT CARPAL TUNNEL RELEASE;  Surgeon: Cindee Salt, MD;  [facility];  Service: Orthopedics;  Laterality: Right;  45 MIN   childbirth     x 2 vaginal  deliveries   COSMETIC SURGERY     forehead plastic surgery     MOH's surgery   FRACTURE SURGERY     KNEE ARTHROSCOPY     right   OOPHORECTOMY     RSO   Social History   Tobacco Use  Smoking Status Never  Smokeless Tobacco Never  To begin PREP Classes next Tuesday 11/15 at [facility] 2-3:15 every T/Th for 12 weeks.   Sonia Baller 10/28/2021, 11:39 AM

## 2021-10-31 ENCOUNTER — Other Ambulatory Visit: Payer: Self-pay | Admitting: Cardiovascular Disease

## 2021-11-04 NOTE — Progress Notes (Signed)
[facility] PREP Weekly Session  Patient Details  Name: Heather Hoover MRN: [id number] Date of Birth: December 07, 1946 Age: 75 y.o. PCP: Donita Brooks, MD  There were no vitals filed for this visit.   [facility] Weekly seesion - 11/04/21 1500       [facility] "PREP" Location   [facility] "PREP" Location [facility]      Weekly Session   Topic Discussed Goal setting and welcome to the program   tour of facility, introductions, offered opportunity for cardio workout.   Classes attended to date 1             Takya Vandivier B [place] 11/04/2021, 3:56 PM

## 2021-11-07 ENCOUNTER — Encounter: Payer: Self-pay | Admitting: Pharmacist

## 2021-11-07 DIAGNOSIS — I119 Hypertensive heart disease without heart failure: Secondary | ICD-10-CM

## 2021-11-07 MED ORDER — VALSARTAN 40 MG PO TABS: 40.0000 mg | ORAL_TABLET | Freq: Every day | ORAL | 1 refills | Status: DC

## 2021-11-11 NOTE — Progress Notes (Signed)
[facility] PREP Weekly Session  Patient Details  Name: ANAKAREN CAMPION MRN: [id number] Date of Birth: 1946/04/08 Age: 75 y.o. PCP: Donita Brooks, MD  There were no vitals filed for this visit.   [facility] Weekly seesion - 11/11/21 1500       [facility] "PREP" Location   [facility] "PREP" Location [facility]      Weekly Session   Topic Discussed Importance of resistance training;Other ways to be active    Minutes exercised this week 330 minutes    Classes attended to date 3             Heyli Min B Tolbert Matheson 11/11/2021, 3:35 PM

## 2021-11-25 NOTE — Progress Notes (Signed)
[facility] PREP Weekly Session  Patient Details  Name: Heather Hoover MRN: [id number] Date of Birth: September 15, 1946 Age: 75 y.o. PCP: Donita Brooks, MD  Vitals:   11/25/21 1507  Weight: 155 lb 3.2 oz (70.4 kg)     [facility] Weekly seesion - 11/25/21 1500       [facility] "PREP" Location   [facility] "PREP" Location [facility]      Weekly Session   Topic Discussed Health habits   sugar demo   Minutes exercised this week 120 minutes    Classes attended to date 4             Abdulah Iqbal B [place] 11/25/2021, 3:08 PM

## 2021-12-01 ENCOUNTER — Encounter: Payer: Self-pay | Admitting: Family Medicine

## 2021-12-01 DIAGNOSIS — Z1231 Encounter for screening mammogram for malignant neoplasm of breast: Secondary | ICD-10-CM | POA: Diagnosis not present

## 2021-12-02 NOTE — Progress Notes (Signed)
[facility] PREP Weekly Session  Patient Details  Name: Heather Hoover MRN: [id number] Date of Birth: 19-Nov-1946 Age: 75 y.o. PCP: Donita Brooks, MD  Vitals:   12/02/21 1518  Weight: 156 lb (70.8 kg)     [facility] Weekly seesion - 12/02/21 1500       [facility] "PREP" Location   [facility] "PREP" Location [facility]      Weekly Session   Topic Discussed Restaurant Eating   Na intake limited to 1500-2300 mg/daily; Salt demo   Minutes exercised this week 60 minutes    Classes attended to date 67             Sonia Baller 12/02/2021, 3:19 PM

## 2021-12-09 NOTE — Progress Notes (Signed)
[facility] PREP Weekly Session  Patient Details  Name: Heather Hoover MRN: [id number] Date of Birth: 23-May-1946 Age: 75 y.o. PCP: Donita Brooks, MD  Vitals:   12/09/21 1522  Weight: 155 lb 3.2 oz (70.4 kg)     [facility] Weekly seesion - 12/09/21 1500       [facility] "PREP" Location   [facility] "PREP" Location [facility]      Weekly Session   Topic Discussed Stress management and problem solving   Practiced breathwork and finger tip mudra; shared fruit bowl meditation   Minutes exercised this week 210 minutes    Classes attended to date 8             Khloee Garza B Marialice Newkirk 12/09/2021, 3:24 PM

## 2021-12-23 NOTE — Progress Notes (Signed)
[facility] PREP Weekly Session  Patient Details  Name: Heather Hoover MRN: [id number] Date of Birth: 04-26-1946 Age: 76 y.o. PCP: Susy Frizzle, MD  Vitals:   12/23/21 1544  Weight: 156 lb (70.8 kg)     [facility] Weekly seesion - 12/23/21 1500       [facility] "PREP" Location   [facility] "PREP" Location [facility]      Weekly Session   Topic Discussed Expectations and non-scale victories   Halfway through program, encouraged to re-vsit goals---successes, challenges, action steps for next 6 weeks   Classes attended to date [place] 12/23/2021, 3:46 PM

## 2021-12-28 ENCOUNTER — Other Ambulatory Visit: Payer: Self-pay | Admitting: Family Medicine

## 2021-12-30 NOTE — Progress Notes (Signed)
[facility] PREP Weekly Session  Patient Details  Name: Heather Hoover MRN: [id number] Date of Birth: 1946/11/12 Age: 76 y.o. PCP: Donita Brooks, MD  Vitals:   12/30/21 1513  Weight: 156 lb (70.8 kg)     [facility] Weekly seesion - 12/30/21 1500       [facility] "PREP" Location   [facility] "PREP" Location [facility]      Weekly Session   Topic Discussed Other   Portion Size Matters, visualize your portion size demo; label review   Minutes exercised this week 60 minutes    Classes attended to date 29             Shelly Spenser B Eoghan Belcher 12/30/2021, 3:16 PM

## 2022-01-02 ENCOUNTER — Other Ambulatory Visit: Payer: Self-pay | Admitting: Family Medicine

## 2022-01-13 NOTE — Progress Notes (Signed)
[facility] PREP Weekly Session  Patient Details  Name: [name] MRN: [id number] Date of Birth: 1946/07/18 Age: 76 y.o. PCP: Donita Brooks, MD  There were no vitals filed for this visit.   [facility] Weekly seesion - 01/13/22 1600       [facility] "PREP" Location   [facility] "PREP" Location [facility]      Weekly Session   Topic Discussed Calorie breakdown   Membership talk with Alyssa; fat/muscle comparison   Minutes exercised this week 300 minutes    Classes attended to date 87             Meckenzie Balsley B Francella Barnett 01/13/2022, 4:13 PM

## 2022-01-20 NOTE — Progress Notes (Signed)
[facility] PREP Weekly Session  Patient Details  Name: Heather Hoover MRN: [id number] Date of Birth: 03-27-1946 Age: 76 y.o. PCP: Donita Brooks, MD  Vitals:   01/20/22 1537  Weight: 157 lb (71.2 kg)     [facility] Weekly seesion - 01/20/22 1500       [facility] "PREP" Location   [facility] "PREP" Location [facility]      Weekly Session   Topic Discussed Hitting roadblocks   Reviewed goals and activity plan for next 90 days, asked to complete PREP/THN survey and bring both to final assessment visit next week.   Minutes exercised this week 240 minutes    Classes attended to date 60             Sonia Baller 01/20/2022, 3:39 PM

## 2022-01-27 NOTE — Progress Notes (Signed)
[facility] PREP Weekly Session  Patient Details  Name: Heather Hoover MRN: [id number] Date of Birth: 1946-09-13 Age: 76 y.o. PCP: Donita Brooks, MD  Vitals:   01/27/22 1540  Weight: 158 lb (71.7 kg)     [facility] Weekly seesion - 01/27/22 1500       [facility] "PREP" Location   [facility] "PREP" Location [facility]      Weekly Session   Topic Discussed Other   Fit testing completed; 3 month how fit and strong survey completed; final assessment visit scheduled for Thursday, to bring goals and activity plan for next 90 days and completed PREP survey Thursday   Minutes exercised this week 300 minutes    Classes attended to date 54             Christopher Hink B Khaleah Duer 01/27/2022, 3:41 PM

## 2022-01-28 ENCOUNTER — Encounter (HOSPITAL_BASED_OUTPATIENT_CLINIC_OR_DEPARTMENT_OTHER): Payer: Self-pay

## 2022-01-28 ENCOUNTER — Ambulatory Visit (HOSPITAL_BASED_OUTPATIENT_CLINIC_OR_DEPARTMENT_OTHER): Payer: Medicare PPO | Admitting: Obstetrics & Gynecology

## 2022-01-29 NOTE — Progress Notes (Signed)
[facility] PREP Evaluation  Patient Details  Name: Heather Hoover MRN: [id number] Date of Birth: February 23, 1946 Age: 76 y.o. PCP: Susy Frizzle, MD  Vitals:   01/29/22 1323  BP: 122/60  Pulse: 65  SpO2: 99%  Weight: 158 lb (71.7 kg)     [facility] Eval - 01/29/22 1300       [facility] "PREP" Location   [facility] "PREP" Location [facility]      Referral    Referring Provider Oval Linsey    Program Start Date 01/29/22   program end date     Mobility and Daily Activities   I find it easy to walk up or down two or more flights of stairs. 3    I have no trouble taking out the trash. 1    I do housework such as vacuuming and dusting on my own without difficulty. 2    I can easily lift a gallon of milk (8lbs). 4    I can easily walk a mile. 3    I have no trouble reaching into high cupboards or reaching down to pick up something from the floor. 3    I do not have trouble doing out-door work such as Armed forces logistics/support/administrative officer, raking leaves, or gardening. 1      Mobility and Daily Activities   I feel younger than my age. 3    I feel independent. 3    I feel energetic. 3    I live an active life.  3    I feel strong. 2    I feel healthy. 3    I feel active as other people my age. 2      How fit and strong are you.   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   echo   Osteopenia 12/2018   T score -1.6 FRAX 10% / 1.5%   Past Surgical History:  Procedure Laterality Date   ABDOMINAL HYSTERECTOMY  02/07/1981   RSO   APPENDECTOMY     BREAST SURGERY     CARDIAC CATHETERIZATION     when in 3rd grade and age 35   CARPAL TUNNEL RELEASE Left 02/06/2021   Procedure: LEFT CARPAL TUNNEL RELEASE;  Surgeon: Daryll Brod, MD;  Location: [facility];  Service:  Orthopedics;  Laterality: Left;  IV REGIONAL FOREARM BLOCK 45 MINUTES   CARPAL TUNNEL RELEASE Right 06/03/2021   Procedure: RIGHT CARPAL TUNNEL RELEASE;  Surgeon: Daryll Brod, MD;  Location: [facility];  Service: Orthopedics;  Laterality: Right;  52 MIN   childbirth     x 2 vaginal deliveries   COSMETIC SURGERY     forehead plastic surgery     MOH's surgery   FRACTURE SURGERY     KNEE ARTHROSCOPY     right   OOPHORECTOMY     RSO   Social History   Tobacco Use  Smoking Status Never  Smokeless Tobacco Never  How fit and strong survey: 10/28/2021: 30  01/29/2022:36 Education sessions attended: 10 Workout session completed: [place] 01/29/2022, 1:25 PM

## 2022-01-30 ENCOUNTER — Other Ambulatory Visit: Payer: Self-pay

## 2022-01-30 ENCOUNTER — Other Ambulatory Visit (HOSPITAL_COMMUNITY)
Admission: RE | Admit: 2022-01-30 | Discharge: 2022-01-30 | Disposition: A | Discharge status: Home / Self Care | Payer: Medicare PPO | Source: Ambulatory Visit | Attending: Obstetrics & Gynecology | Admitting: Obstetrics & Gynecology

## 2022-01-30 ENCOUNTER — Ambulatory Visit (HOSPITAL_BASED_OUTPATIENT_CLINIC_OR_DEPARTMENT_OTHER): Payer: Medicare PPO | Admitting: Obstetrics & Gynecology

## 2022-01-30 ENCOUNTER — Encounter (HOSPITAL_BASED_OUTPATIENT_CLINIC_OR_DEPARTMENT_OTHER): Payer: Self-pay | Admitting: Obstetrics & Gynecology

## 2022-01-30 VITALS — BP 158/94 | HR 63 | Ht 64.0 in | Wt 154.0 lb

## 2022-01-30 DIAGNOSIS — Z9189 Other specified personal risk factors, not elsewhere classified: Secondary | ICD-10-CM | POA: Diagnosis not present

## 2022-01-30 DIAGNOSIS — Z91B Personal risk factor of exposure to diethylstilbestrol: Secondary | ICD-10-CM

## 2022-01-30 DIAGNOSIS — Z780 Asymptomatic menopausal state: Secondary | ICD-10-CM | POA: Diagnosis not present

## 2022-01-30 DIAGNOSIS — N3281 Overactive bladder: Secondary | ICD-10-CM | POA: Diagnosis not present

## 2022-01-30 DIAGNOSIS — M8580 Other specified disorders of bone density and structure, unspecified site: Secondary | ICD-10-CM

## 2022-01-30 NOTE — Progress Notes (Signed)
76 y.o. G63P2002 Married White or Caucasian female here for new patient appointment.  Former pt of Dr. Audie Box and then Dr. Penni Bombard.  PMP and has no vaginal bleeding.  No recent HRT.  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  Guidelines for pap smears discussed.  H/O TAH with RSO in 1982.  Never had an abnormal pap smear.  Pt also having issues with urinary urgency.  Has hypertension and is on several medications.  Would be interested in alterative therapies for treatment as well.  Will refer for consideration of PTNS.  Health Maintenance: PCP:  Dr. Tanya Nones.  Last wellness appt was 06/2021.  Did blood work at that appt:   Vaccines are up to date:  do not have complete shingrix documentation Colonoscopy:  07/15/20 MMG:  12/01/21 benign BMD:  12/26/18 low mass Last pap smear:  01/16/21 neg.   H/o abnormal pap smear:  no    reports that she has never smoked. She has never used smokeless tobacco. She reports current alcohol use. She reports that she does not use drugs.  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   echo   Osteopenia 12/2018   T score -1.6 FRAX 10% / 1.5%    Past Surgical History:  Procedure Laterality Date   ABDOMINAL HYSTERECTOMY  02/07/1981   RSO   APPENDECTOMY     BREAST SURGERY     CARDIAC CATHETERIZATION     when in 3rd grade and age 62   CARPAL TUNNEL RELEASE Left 02/06/2021   Procedure: LEFT CARPAL TUNNEL RELEASE;  Surgeon: Cindee Salt, MD;  Location: [facility];  Service: Orthopedics;  Laterality: Left;  IV REGIONAL FOREARM BLOCK 45 MINUTES   CARPAL TUNNEL RELEASE Right 06/03/2021   Procedure: RIGHT CARPAL TUNNEL RELEASE;  Surgeon: Cindee Salt, MD;  Location: [facility];  Service: Orthopedics;  Laterality: Right;  45 MIN   COSMETIC SURGERY     forehead  plastic surgery     MOH's surgery   FRACTURE SURGERY     KNEE ARTHROSCOPY     right    Current Outpatient Medications  Medication Sig Dispense Refill   acetaminophen (TYLENOL) 325 MG tablet Take 650 mg by mouth every 6 (six) hours as needed for mild pain or headache.     carvedilol (COREG) 6.25 MG tablet TAKE 1 TABLET(6.25 MG) BY MOUTH TWICE DAILY WITH A MEAL 180 tablet 3   Cholecalciferol (VITAMIN D-3 PO) Take 2,000 Units by mouth daily. Taking 2000 daily     Famotidine (PEPCID PO) Take by mouth.     meloxicam (MOBIC) 15 MG tablet TAKE 1 TABLET(15 MG) BY MOUTH DAILY. STOP DICLOFENAC 30 tablet 2   polyvinyl alcohol (LIQUIFILM TEARS) 1.4 % ophthalmic solution Place 1 drop into both eyes as needed for dry eyes.     potassium chloride (KLOR-CON) 10 MEQ tablet TAKE 1 TABLET(10 MEQ) BY MOUTH DAILY 90 tablet 2   rosuvastatin (CRESTOR) 20 MG tablet TAKE 1 TABLET(20 MG) BY MOUTH DAILY 90 tablet 3   valsartan (DIOVAN) 40 MG tablet Take 1 tablet (40 mg total) by mouth daily. 90 tablet 1   EFUDEX 5 % cream SMARTSIG:Sparingly Topical Every Night (Patient not taking: Reported on 01/30/2022)  No current facility-administered medications for this visit.    Family History  Problem Relation Age of Onset   Hypertension Mother    Breast cancer Mother 13       Breast    Cancer Mother        uterine thye think   Cancer Father 12       prostate metastized to bone   Hypertension Father    Arthritis Father    Hypertension Sister    Breast cancer Sister 46   Cancer Sister 18       uterine   Heart failure Sister    Muscular dystrophy Brother     Review of Systems  Exam:   BP (!) 158/94    Pulse 63    Ht 5\' 4"  (1.626 m)    Wt 154 lb (69.9 kg)    BMI 26.43 kg/m   Height: 5\' 4"  (162.6 cm)  General appearance: alert, cooperative and appears stated age Breasts: normal appearance, no masses or tenderness Abdomen: soft, non-tender; bowel sounds normal; no masses,  no organomegaly Lymph nodes:  Cervical, supraclavicular, and axillary nodes normal.  No abnormal inguinal nodes palpated Neurologic: Grossly normal  Pelvic: External genitalia:  no lesions              Urethra:  normal appearing urethra with no masses, tenderness or lesions              Bartholins and Skenes: normal                 Vagina: normal appearing vagina with atrophic changes and no discharge, no lesions              Cervix: absent              Pap taken: Yes.   Bimanual Exam:  Uterus:  uterus absent              Adnexa: no mass, fullness, tenderness               Rectovaginal: Confirms               Anus:  normal sphincter tone, no lesions  Chaperone, , RN, was present for exam.  Assessment/Plan: 1. Postmenopausal - pap smears recommended yearly given DES exposure hx - MMG 11/2021 - BMD 2020 with osteopenia.  Recommended repeating. - Colonoscopy 06/2020 - Screening lab work done with Dr. 12/2021  2. DES exposure in utero - Cytology - PAP( [facility])  3. Osteopenia, unspecified location - DG BONE DENSITY (DXA); Future  4. OAB (overactive bladder) - Ambulatory referral to Urogynecology

## 2022-02-02 DIAGNOSIS — G43B0 Ophthalmoplegic migraine, not intractable: Secondary | ICD-10-CM | POA: Diagnosis not present

## 2022-02-02 DIAGNOSIS — H0012 Chalazion right lower eyelid: Secondary | ICD-10-CM | POA: Diagnosis not present

## 2022-02-02 DIAGNOSIS — H43811 Vitreous degeneration, right eye: Secondary | ICD-10-CM | POA: Diagnosis not present

## 2022-02-02 DIAGNOSIS — H2513 Age-related nuclear cataract, bilateral: Secondary | ICD-10-CM | POA: Diagnosis not present

## 2022-02-02 DIAGNOSIS — H10413 Chronic giant papillary conjunctivitis, bilateral: Secondary | ICD-10-CM | POA: Diagnosis not present

## 2022-02-02 DIAGNOSIS — D3132 Benign neoplasm of left choroid: Secondary | ICD-10-CM | POA: Diagnosis not present

## 2022-02-02 DIAGNOSIS — H04123 Dry eye syndrome of bilateral lacrimal glands: Secondary | ICD-10-CM | POA: Diagnosis not present

## 2022-02-02 LAB — CYTOLOGY - PAP: Diagnosis: NEGATIVE

## 2022-03-27 ENCOUNTER — Other Ambulatory Visit: Payer: Self-pay | Admitting: Family Medicine

## 2022-03-30 ENCOUNTER — Other Ambulatory Visit: Payer: Self-pay

## 2022-04-15 ENCOUNTER — Encounter: Payer: Self-pay | Admitting: Obstetrics and Gynecology

## 2022-04-15 ENCOUNTER — Ambulatory Visit: Payer: Medicare PPO | Admitting: Obstetrics and Gynecology

## 2022-04-15 VITALS — BP 172/95 | HR 61 | Ht 64.0 in | Wt 155.0 lb

## 2022-04-15 DIAGNOSIS — N8110 Cystocele, unspecified: Secondary | ICD-10-CM

## 2022-04-15 DIAGNOSIS — N3281 Overactive bladder: Secondary | ICD-10-CM

## 2022-04-15 NOTE — Patient Instructions (Signed)

## 2022-04-15 NOTE — Progress Notes (Signed)
[facility] ?New Patient Evaluation and Consultation ? ?Referring Provider: Jerene Bears, MD ?PCP: Donita Brooks, MD ?Date of Service: 04/15/2022 ? ?SUBJECTIVE ?Chief Complaint: New Patient (Initial Visit) Heather Hoover is a 76 y.o. female here for a consult on OAB and urgency./) ? ?History of Present Illness: Heather Hoover is a 76 y.o. White or Caucasian female seen in consultation at the request of Dr. Hyacinth Meeker for evaluation of urinary urgency.   ? ?Review of records significant for: ?Has been having issues with urinary urgency. Has previously tried medication.  ? ?Urinary Symptoms: ?Does not usually leak urine- had had two episodes with large volume leakage without warning over the last two years.  ?Wears a pad just in case. Feels her bottom reacts to the pad.  ?Has to run to the bathroom when she gets an urge and dribbles when she sits on the toilet.  ? ?Day time voids 5.  Nocturia: 2 times per night to void. ?Voiding dysfunction: she does not empty her bladder well.  ?does not use a catheter to empty bladder.  ?When urinating, she feels a weak stream ?Drinks: 2 cups coffee in AM (mixed decaf), 6oz diet coke, water, occasional beer per day ?Triggers for urgency- placing hands in water, running water, caffeine ?Has tried detrol years ago ? ?UTIs:  0  UTI's in the last year.   ?Denies history of blood in urine and kidney or bladder stones ? ?Pelvic Organ Prolapse Symptoms:                  ?She Admits to a feeling of a bulge the vaginal area.  ?She Denies seeing a bulge.  ?This bulge is not bothersome. ? ?Bowel Symptom: ?Bowel movements: 2 time(s) per day ?Stool consistency: soft  ?Straining: yes.  ?Splinting: no.  ?Incomplete evacuation: yes.  ?She Denies accidental bowel leakage / fecal incontinence ?Bowel regimen: stool softener ?Last colonoscopy: Date 2021, Results: removed polyp, hemorrhoids ? ?Sexual Function ?Sexually active: no.  ? ? ?Pelvic Pain ?Denies pelvic pain ? ? ?Past  Medical History:  ?Past Medical History:  ?Diagnosis Date  ? Arthritis   ? Bone spur   ? DES exposure in utero   ? Dyslipidemia   ? Exogenous obesity   ? Heart murmur   ? Hypertension   ? 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 echo  ? Osteopenia 12/2018  ? T score -1.6 FRAX 10% / 1.5%  ? ? ? ?Past Surgical History:   ?Past Surgical History:  ?Procedure Laterality Date  ? ABDOMINAL HYSTERECTOMY  02/07/1981  ? RSO  ? APPENDECTOMY    ? BREAST SURGERY    ? CARDIAC CATHETERIZATION    ? when in 3rd grade and age 62  ? CARPAL TUNNEL RELEASE Left 02/06/2021  ? Procedure: LEFT CARPAL TUNNEL RELEASE;  Surgeon: Cindee Salt, MD;  Location: [facility];  Service: Orthopedics;  Laterality: Left;  IV REGIONAL FOREARM BLOCK 45 MINUTES  ? CARPAL TUNNEL RELEASE Right 06/03/2021  ? Procedure: RIGHT CARPAL TUNNEL RELEASE;  Surgeon: Cindee Salt, MD;  Location: [facility];  Service: Orthopedics;  Laterality: Right;  45 MIN  ? COSMETIC SURGERY    ? forehead plastic surgery    ? MOH's surgery  ? FRACTURE SURGERY    ? KNEE ARTHROSCOPY    ? right  ? ? ? ?Past OB/GYN History: ?OB History  ?Gravida Para Term Preterm AB Living  ?2 2 2  0 2  ?SAB IAB Ectopic Multiple Live Births  ?    0   2  ?  ?# Outcome Date GA Lbr Len/2nd Weight Sex Delivery Anes PTL Lv  ?2 Term           ?1 Term           ? ? ?Vaginal deliveries: 2,  Forceps/ Vacuum deliveries: 0, Cesarean section: 0 ?S/p hysterectomy and RSO ? ? ?Medications: She has a current medication list which includes the following prescription(s): acetaminophen, carvedilol, cholecalciferol, efudex, famotidine, meloxicam, polyvinyl alcohol, potassium chloride, prevident 5000 dry mouth, rosuvastatin, and valsartan.  ? ?Allergies: Patient is allergic to augmentin [amoxicillin-pot clavulanate], niacin-lovastatin er, and kenalog [triamcinolone].  ? ?Social History:  ?Social History  ? ?Tobacco Use  ? Smoking status: Never  ? Smokeless tobacco: Never   ?Vaping Use  ? Vaping Use: Never used  ?Substance Use Topics  ? Alcohol use: Yes  ?  Comment: Occas  ? Drug use: No  ? ? ?Relationship status: married ?She lives with husband.   ?She is not employed . ?Regular exercise: Yes: [employer] twice a week for cardio and weights ?History of abuse: No ? ?Family History:   ?Family History  ?Problem Relation Age of Onset  ? Hypertension Mother   ? Breast cancer Mother [id number]  ?     Breast   ? Cancer Mother   ?     uterine thye think  ? Cancer Father [phone]  ?     prostate metastized to bone  ? Hypertension Father   ? Arthritis Father   ? Hypertension Sister   ? Breast cancer Sister [phone]  ? Cancer Sister [phone]  ?     uterine  ? Heart failure Sister   ? Muscular dystrophy Brother   ? ? ? ?Review of Systems: Review of Systems  ?Constitutional:  Negative for fever, malaise/fatigue and weight loss.  ?Respiratory:  Negative for cough, shortness of breath and wheezing.   ?Cardiovascular:  Negative for chest pain, palpitations and leg swelling.  ?Gastrointestinal:  Negative for abdominal pain and blood in stool.  ?Genitourinary:  Negative for dysuria.  ?Musculoskeletal:  Negative for myalgias.  ?Skin:  Negative for rash.  ?Neurological:  Negative for dizziness and headaches.  ?Endo/Heme/Allergies:  Bruises/bleeds easily.  ?Psychiatric/Behavioral:  Negative for depression. The patient is not nervous/anxious.   ? ? ?OBJECTIVE ?Physical Exam: ?Vitals:  ? 04/15/22 1416  ?BP: (!) 172/95  ?Pulse: 61  ?Weight: 155 lb (70.3 kg)  ?Height: 5\' 4"  (1.626 m)  ? ? ?Physical Exam ?Constitutional:   ?   General: She is not in acute distress. ?Pulmonary:  ?   Effort: Pulmonary effort is normal.  ?Abdominal:  ?   General: There is no distension.  ?   Palpations: Abdomen is soft.  ?   Tenderness: There is no abdominal tenderness. There is no rebound.  ?Musculoskeletal:     ?   General: No swelling. Normal range of motion.  ?Skin: ?   General: Skin is warm and dry.  ?   Findings: No rash.  ?Neurological:  ?   Mental  Status: She is alert and oriented to person, place, and time.  ?Psychiatric:     ?   Mood and Affect: Mood normal.     ?   Behavior: Behavior normal.  ? ? ? ?GU / Detailed Urogynecologic Evaluation:  ?Pelvic Exam: Normal external female genitalia; Bartholin's and Skene's glands normal in appearance; urethral meatus normal  in appearance, no urethral masses or discharge.  ? ?CST: negative ? ?s/p hysterectomy: Speculum exam reveals normal vaginal mucosa with  atrophy and normal vaginal cuff.  Adnexa no mass, fullness, tenderness.   ? ? ?Pelvic floor strength I/V\ ? ?Pelvic floor musculature: Right levator non-tender, Right obturator non-tender, Left levator non-tender, Left obturator non-tender ? ?POP-Q:  ? ?POP-Q ? ?-0.5  ?                                          Aa   ?-0.5 ?                                          Ba  ?-6  ?                                            C  ? ?3.5  ?                                          Gh  ?4  ?                                          Pb  ?7  ?                                          tvl  ? ?-2  ?                                          Ap  ?-2  ?                                          Bp  ?   ?                                            D  ? ? ? ?Rectal Exam:  ?External rectum with hemorrhoid ? ?Post-Void Residual (PVR) by Bladder Scan: ?In order to evaluate bladder emptying, we discussed obtaining a postvoid residual and she agreed to this procedure. ? ?Procedure: The ultrasound unit was placed on the patient's abdomen in the suprapubic region after the patient had voided. A PVR of 10 ml was obtained by bladder scan. ? ?Laboratory Results: ?POC urine:  ? ? ?ASSESSMENT AND PLAN ?Ms. Pimenta is a 76 y.o. with:  ?1. OAB (overactive bladder)   ?2. Prolapse of anterior vaginal wall   ? ?OAB ?- We discussed the symptoms of overactive bladder (OAB), which include urinary urgency, urinary frequency, nocturia,  with or without urge incontinence.  While we do not know the exact  etiology of OAB, several treatment options exist. We discussed management including behavioral therapy (decreasing bladder irritants, urge suppression strategies, timed voids, bladder retraining), physical therapy,

## 2022-04-21 ENCOUNTER — Encounter: Payer: Self-pay | Admitting: Pharmacist

## 2022-04-23 MED ORDER — VALSARTAN 80 MG PO TABS
80.0000 mg | ORAL_TABLET | Freq: Every day | ORAL | 3 refills | Status: DC
Start: 2022-04-23 — End: 2022-05-20

## 2022-05-11 ENCOUNTER — Ambulatory Visit: Payer: Medicare PPO | Admitting: Pharmacist Clinician (PhC)/ Clinical Pharmacy Specialist

## 2022-05-11 VITALS — BP 166/80 | HR 70 | Resp 14 | Ht 65.0 in | Wt 161.4 lb

## 2022-05-11 DIAGNOSIS — I119 Hypertensive heart disease without heart failure: Secondary | ICD-10-CM | POA: Diagnosis not present

## 2022-05-11 NOTE — Assessment & Plan Note (Signed)
Patient with essential hypertensin, and some measure of white coat hypertension.  Her home readings have been running higher, perhaps in part due to pain issues and/or NSAID use.   Will have her increase the valsartan to 160 mg daily and repeat metabolic panel in 2 weeks.   She was also encouraged to continue working on lifestyle management.  Advised that she work with [employer] on exercises to help with back pains.  We will see her back in 2 months for follow up, and she can reach out via My Chart anytime before that should she have concerns.

## 2022-05-11 NOTE — Patient Instructions (Signed)
Return for a a follow up appointment in July 25 at 10 am  Go to the lab in 2 weeks  Check your blood pressure at home daily and keep record of the readings.  Take your BP meds as follows:  Increase the valsartan to 160 mg once daily in the morning  Continue with all other medications  Bring all of your meds, your BP cuff and your record of home blood pressures to your next appointment.  Exercise as you're able, try to walk approximately 30 minutes per day.  Keep salt intake to a minimum, especially watch canned and prepared boxed foods.  Eat more fresh fruits and vegetables and fewer canned items.  Avoid eating in fast food restaurants.    HOW TO TAKE YOUR BLOOD PRESSURE: Rest 5 minutes before taking your blood pressure.  Don't smoke or drink caffeinated beverages for at least 30 minutes before. Take your blood pressure before (not after) you eat. Sit comfortably with your back supported and both feet on the floor (don't cross your legs). Elevate your arm to heart level on a table or a desk. Use the proper sized cuff. It should fit smoothly and snugly around your bare upper arm. There should be enough room to slip a fingertip under the cuff. The bottom edge of the cuff should be 1 inch above the crease of the elbow. Ideally, take 3 measurements at one sitting and record the average.

## 2022-05-11 NOTE — Progress Notes (Signed)
05/11/2022 Heather Hoover 1946/03/06 [id number]   HPI:  Heather Hoover is a 76 y.o. female referred by Dr. Duke Salvia to HTN clinic. In addition to hypertension, her medical history is significant for hyperlipidemia (LDD 67 on rosuvastatin 20) and congenital pulmonic stenosis.  She was seen by Dr. Duke Salvia last fall, at which time her BP was 190/82.  Because home readings were 120-130's, she was asked to continue with carvedilol and return in 1-2 weeks with home meter.  She then saw Laural Golden PharmD within a week.  Office reading was equally elevated (196/82), and home cuff read 182/84.  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  He suggested that she work on sodium reduction, caffeine free drinks and water aerobics for exercise.  She was given valsartan 40 mg and asked to report back via My Chart as to how her home readings were doing.   She reported readings in November and was doing well.  She reached out to the office earlier this month, noting home BP readings were more commonly in the 140-160 range.  She was asked to increase valsartan to 80 mg daily, repeat BMET in 2 weeks and come into the office for follow up.    Today she is in the office for BP check.  About 10 days ago she was walking her dog when he pulled away and she fell backwards, landing on her rear.  She jarred her back some, and has been sore since then.  She did see a spike in her BP readings that day and the next.  Since then it has started to drop, although is still mostly in the 140's.    Blood Pressure Goal:  130/80  Current Medications: valsartan 80 mg qd, carvedilol 6.25 mg bid  Family Hx: 1 brother living; many in her family had hypertension, although most deaths 2/2 cancer; son and Designer, jewellery, both healthy  Social Hx: no tobacco, only occasional alcohol; coffee in am (3/1 decaf), addicted to diet Coke;  Diet: more home cooked meals, does some take and bake; Weight watchers plan; plenty of f/v,  mostly fresh, steamed or occasionally sauteed; not adding salt, but some processed foods  Exercise: before back injury; PREP exercise class, then joined Y,   Home BP readings:   AM 15 readings  - 140/67  (range 128-151/57-73)  PM 18 readings  - 142/68  (range 135-165/55-84)  Intolerances: hctz - dry mouth; carvedilol - fatigue  Labs:  9/22:  Na 136, K 5.1, Glu 92, BUN 12, SCr 0.6, GFR 94   Wt Readings from Last 3 Encounters:  05/11/22 161 lb 6.4 oz (73.2 kg)  04/15/22 155 lb (70.3 kg)  01/30/22 154 lb (69.9 kg)   BP Readings from Last 3 Encounters:  05/11/22 (!) 166/80  04/15/22 (!) 172/95  01/30/22 (!) 158/94   Pulse Readings from Last 3 Encounters:  05/11/22 70  04/15/22 61  01/30/22 63    Current Outpatient Medications  Medication Sig Dispense Refill   acetaminophen (TYLENOL) 325 MG tablet Take 650 mg by mouth every 6 (six) hours as needed for mild pain or headache.     carvedilol (COREG) 6.25 MG tablet TAKE 1 TABLET(6.25 MG) BY MOUTH TWICE DAILY WITH A MEAL 180 tablet 3   Cholecalciferol (VITAMIN D-3 PO) Take 2,000 Units by mouth daily. Taking 2000 daily     EFUDEX 5 % cream      Famotidine (PEPCID PO) Take by mouth.  meloxicam (MOBIC) 15 MG tablet TAKE 1 TABLET(15 MG) BY MOUTH DAILY. STOP DICLOFENAC 30 tablet 2   polyvinyl alcohol (LIQUIFILM TEARS) 1.4 % ophthalmic solution Place 1 drop into both eyes as needed for dry eyes.     potassium chloride (KLOR-CON) 10 MEQ tablet TAKE 1 TABLET(10 MEQ) BY MOUTH DAILY 90 tablet 2   PREVIDENT 5000 DRY MOUTH 1.1 % GEL dental gel Place onto teeth.     rosuvastatin (CRESTOR) 20 MG tablet TAKE 1 TABLET(20 MG) BY MOUTH DAILY 90 tablet 3   valsartan (DIOVAN) 80 MG tablet Take 1 tablet (80 mg total) by mouth daily. 90 tablet 3   No current facility-administered medications for this visit.    Allergies  Allergen Reactions   Augmentin [Amoxicillin-Pot Clavulanate] Diarrhea    Did it involve swelling of the face/tongue/throat,  SOB, or low BP? yes Did it involve sudden or severe rash/hives, skin peeling, or any reaction on the inside of your mouth or nose? no Did you need to seek medical attention at a hospital or doctor's office? no When did it last happen?      2000 If all above answers are "NO", may proceed with cephalosporin use.    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 rate 14, height 5\' 5"  (1.651 m), weight 161 lb 6.4 oz (73.2 kg), SpO2 99 %.  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  Her home readings have been running higher, perhaps in part due to pain issues and/or NSAID use.   Will have her increase the valsartan to 160 mg daily and repeat metabolic panel in 2 weeks.   She was also encouraged to continue working on lifestyle management.  Advised that she work with on exercises to help with back pains.  We will see her back in 2 months for follow up, and she can reach out via My Chart anytime before that should she have concerns.    [employer] PharmD CPP [facility] [address] [place], [place] [phone]

## 2022-05-20 ENCOUNTER — Other Ambulatory Visit: Payer: Self-pay | Admitting: Pharmacist Clinician (PhC)/ Clinical Pharmacy Specialist

## 2022-05-20 ENCOUNTER — Encounter: Payer: Self-pay | Admitting: Pharmacist Clinician (PhC)/ Clinical Pharmacy Specialist

## 2022-05-20 MED ORDER — VALSARTAN 160 MG PO TABS: 160.0000 mg | ORAL_TABLET | Freq: Every day | ORAL | 3 refills | Status: DC

## 2022-05-22 NOTE — Therapy (Signed)
OUTPATIENT PHYSICAL THERAPY FEMALE PELVIC EVALUATION   Patient Name: Heather Hoover MRN: [id number] DOB:[birth year], 76 y.o., female Today's Date: 05/25/2022   PT End of Session - 05/25/22 1547     Visit Number 1    Date for PT Re-Evaluation 08/17/22    Authorization Type [employer]    PT Start Time 1100    PT Stop Time 1143    PT Time Calculation (min) 43 min    Activity Tolerance Patient tolerated treatment well    Behavior During Therapy WFL for tasks assessed/performed             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   echo   Osteopenia 12/2018   T score -1.6 FRAX 10% / 1.5%   Past Surgical History:  Procedure Laterality Date   ABDOMINAL HYSTERECTOMY  02/07/1981   RSO   APPENDECTOMY     BREAST SURGERY     CARDIAC CATHETERIZATION     when in 3rd grade and age 37   CARPAL TUNNEL RELEASE Left 02/06/2021   Procedure: LEFT CARPAL TUNNEL RELEASE;  Surgeon: Cindee Salt, MD;  Location: [facility];  Service: Orthopedics;  Laterality: Left;  IV REGIONAL FOREARM BLOCK 45 MINUTES   CARPAL TUNNEL RELEASE Right 06/03/2021   Procedure: RIGHT CARPAL TUNNEL RELEASE;  Surgeon: Cindee Salt, MD;  Location: [facility];  Service: Orthopedics;  Laterality: Right;  45 MIN   COSMETIC SURGERY     forehead plastic surgery     MOH's surgery   FRACTURE SURGERY     KNEE ARTHROSCOPY     right   Patient Active Problem List   Diagnosis Date Noted   Spondylosis without myelopathy or radiculopathy, cervical region 05/14/2020   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    Dyslipidemia    Arthritis    Pulmonic stenosis, 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   Hypercholesterolemia 08/17/2011   Benign hypertensive heart disease without heart failure 08/17/2011    PCP: Donita Brooks, MD  REFERRING PROVIDER: Marguerita Beards, MD  REFERRING DIAG: N32.81 (ICD-10-CM) - OAB (overactive bladder)  THERAPY DIAG:  Muscle weakness (generalized)  Urinary urgency  Other low back pain  Rationale for Evaluation and Treatment Rehabilitation  ONSET DATE: 03/21/2022  SUBJECTIVE:  SUBJECTIVE STATEMENT: Dribbles when she sits on the toilet. Bottom reacts to pads. She has urgency.  Weak stream. Triggers for urgency placing hands in water, running water, caffeine Strains with bowel movements. When she has to go she has to go.  Fluid intake: Yes: diet coke    Patient confirms identification and approves PT to assess pelvic floor and treatment Yes   PAIN:  Are you having pain? Yes NPRS scale: 3/10 Pain location:  left low back  Pain type: aching and dull Pain description: intermittent   Aggravating factors: getting up and move  Relieving factors: sitting  PRECAUTIONS: None  WEIGHT BEARING RESTRICTIONS No  FALLS:  Has patient fallen in last 6 months? Yes. Number of falls 1 time when her dog pulled her very hard and has a sore back.   LIVING ENVIRONMENT: Lives with: lives with their family  OCCUPATION: retired  PLOF: Independent  PATIENT GOALS reduce urgency and leakage  PERTINENT HISTORY:  Abdominal hysterectomy 02/07/1981, Cardiac Catherization,   BOWEL MOVEMENT Pain with bowel movement: No Type of bowel movement:Type (Bristol Stool Scale) Type 3 and 4, Frequency daily, and Strain Yes Fully empty rectum: No Leakage: No Fiber supplement: Yes: stool softner  URINATION Pain with urination: No Fully empty bladder: Yes:   Stream:  weaker  and depends on how full her bladder is Urgency: Yes:   Frequency: sometimes every 2 hours and drinking caffeine will have to  urinate Leakage: Urge to void, Walking to the bathroom, and can be a large leak to change her clothes Pads: Yes: just incase   PREGNANCY Vaginal deliveries 2 Tearing Yes: episiotomy  PROLAPSE None    OBJECTIVE:   DIAGNOSTIC FINDINGS:  PVR 10 ml  PATIENT SURVEYS: PFIQ-7 UIQ-7 14  COGNITION:  Overall cognitive status: Within functional limits for tasks assessed       LUMBAR SPECIAL TESTS:  Trendelenburg sign: Positive on the left    POSTURE:  Reduced lumbar lordosis  LUMBARAROM/PROM  A/PROM A/PROM  eval  Flexion Minimal movement in the lumbar and fascial tightness  Extension Decreased by 25%  Right lateral flexion Decreased by 25%  Left rotation Decreased by 25%   (Blank rows = not tested)  LOWER EXTREMITY ROM:  Passive ROM Right eval Left eval  Hip external rotation 50 65   (Blank rows = not tested)  LOWER EXTREMITY MMT:  MMT Right eval Left eval  Hip flexion 4/5 4/5  Hip extension 4/5 4/5  Hip abduction 3/5 2/5  Hip adduction 4/5 4/5   PELVIC MMT:   MMT eval  Vaginal 1/5  (Blank rows = not tested)        PALPATION:   General  restrictions at the hysterectomy area; contracts the upper instead of lower abdominal;                 External Perineal Exam left ilium is posteriorly rotated, decreased movement of L1-L5, tightness in the lumbar paraspinals                             Internal Pelvic Floor dryness vaginally externally and internally  TONE: increased  PROLAPSE: none  TODAY'S TREATMENT  EVAL completed eval   PATIENT EDUCATION:  05/25/2022 Education details: education on vaginal moisturizers and how they help the vaginal dryness, gave patient sample of moisturizers from [employer] educated: Patient Education method: Explanation, Demonstration, and Handouts Education comprehension: verbalized understanding, returned demonstration, verbal cues  required, tactile cues required, and needs further education   HOME  EXERCISE PROGRAM: 6/5/203 Education on vaginal moisturizers  ASSESSMENT:  CLINICAL IMPRESSION: Patient is a 76 y.o. female who was seen today for physical therapy evaluation and treatment for overactive bladder. Patient overactive bladder has increased in the past 2 months when she to wear a pad due to the gush of urine that comes out. Patient will leak urine with urgency and walking to the bathroom with urgency. She will feel the urge to urinate with placing hands in water, running water, and drinking caffeine.Patient has a weak stream unless she has a full bladder. She will urinate frequently so the stream is usually weak due to less urine. She does strain to have a bowel movement and takes a fiber supplement. UIQ-7  score is 14. Trendelenburg sign is positive on the left. Right hip ER is 45 degrees passively. Pelvic floor strength is 1/5 with tightness. Her left low back pain is 3/10 due to her dog pulling her hard on the lease and she fell down. Left ilium is rotated posteriorly. She has tightness in the quadratus. Her lumbar lordosis is decreased. Decreased movement of L1-L5. Bilateral hip strength is 4/5 except with abduction and is weaker. Patient will bulge the lower abdomen when she contracts it. Patient vaginal area is dry and was educated on vaginal moisturizers to try and where to place them. Patient has fascial restrictions of the hysterectomy scar. Patient will benefit from skilled therapy to improve pelvic floor strength and coordination to reduce her leakage and back pain.     OBJECTIVE IMPAIRMENTS decreased activity tolerance, decreased coordination, decreased endurance, decreased ROM, decreased strength, and increased fascial restrictions.   ACTIVITY LIMITATIONS continence, toileting, and locomotion level  PARTICIPATION LIMITATIONS: community activity  PERSONAL FACTORS Abdominal hysterectomy 02/07/1981, Cardiac Catherization,  are also affecting patient's functional outcome.    REHAB POTENTIAL: Excellent  CLINICAL DECISION MAKING: Stable/uncomplicated  EVALUATION COMPLEXITY: Low   GOALS: Goals reviewed with patient? Yes  SHORT TERM GOALS: Target date: 06/22/2022  Patient independent with initial HEP for diaphragmatic breathing and hip stretches.  Baseline: Goal status: INITIAL  2.  Patient understands what vaginal moisturizers are and how they reduce dryness.  Baseline:  Goal status: INITIAL  3.  Patient able to contract her abdominals without bulging the lower abdomen and putting pressure on the pelvic floor.  Baseline:  Goal status: INITIAL  4.  Patient understands ways to control the urge to void.  Baseline:  Goal status: INITIAL  5.  Patent understands what bladder irritants are and how they affect the bladder.  Baseline:  Goal status: INITIAL   LONG TERM GOALS: Target date: 08/17/2022   Patient independent with advanced HEP for core and pelvic floor strength to reduce urinary leakage and low back pain.  Baseline:  Goal status: INITIAL  2.  Patient is able to walk to the bathroom without leaking urine due to increased pelvic floor strength >/= 3/5 and not bulging her lower abdomen.  Baseline:  Goal status: INITIAL  3.  Patient is able to hear running water and placing her hands in the water with urgency decreased </= 75%.  Baseline:  Goal status: INITIAL  4.  Patient reports her vaginal dryness is >/= 75% better due to using vaginal moisturizers to improve tissue health.  Baseline:  Goal status: INITIAL  5.  Patient reports her low back pain is </= 1-2/10 due to improve lumbar motion.  Baseline:  Goal status: INITIAL  PLAN: PT FREQUENCY: 1x/week  PT DURATION: 12 weeks  PLANNED INTERVENTIONS: Therapeutic exercises, Therapeutic activity, Neuromuscular re-education, Patient/Family education, Joint mobilization, Dry Needling, Electrical stimulation, Spinal mobilization, Moist heat, scar mobilization, Taping, Biofeedback, and  Manual therapy  PLAN FOR NEXT SESSION: manual work to pelvic floor to improve tissue mobility, hip stretches and back, review vaginal moisturizers, diaphragmatic breathing   Eulis Foster, PT 05/25/22 4:08 PM

## 2022-05-25 ENCOUNTER — Encounter: Payer: Self-pay | Admitting: Physical Therapy

## 2022-05-25 ENCOUNTER — Ambulatory Visit: Payer: Medicare PPO | Attending: Obstetrics and Gynecology | Admitting: Physical Therapy

## 2022-05-25 DIAGNOSIS — R3915 Urgency of urination: Secondary | ICD-10-CM | POA: Insufficient documentation

## 2022-05-25 DIAGNOSIS — M5459 Other low back pain: Secondary | ICD-10-CM | POA: Insufficient documentation

## 2022-05-25 DIAGNOSIS — M6281 Muscle weakness (generalized): Secondary | ICD-10-CM | POA: Diagnosis not present

## 2022-05-25 NOTE — Patient Instructions (Signed)
Moisturizers They are used in the vagina to hydrate the mucous membrane that make up the vaginal canal. Designed to keep a more normal acid balance (ph) Once placed in the vagina, it will last between two to three days.  Use 2-3 times per week at bedtime  Ingredients to avoid is glycerin and fragrance, can increase chance of infection Should not be used just before sex due to causing irritation Most are gels administered either in a tampon-shaped applicator or as a vaginal suppository. They are non-hormonal.   Types of Moisturizers(internal use)  Vitamin E vaginal suppositories- Whole foods, Amazon Moist Again Coconut oil- can break down condoms Julva- (Do no use if on Tamoxifen) amazon Yes moisturizer- amazon NeuEve Silk , NeuEve Silver for menopausal or over 65 (if have severe vaginal atrophy or cancer treatments use NeuEve Silk for  1 month than move to NeuEve Silver)- Amazon, Neuve.com Olive and Bee intimate cream- www.oliveandbee.com.au Mae vaginal moisturizer- Amazon Aloe    Creams to use externally on the Vulva area Desert Harvest Releveum (good for for cancer patients that had radiation to the area)- amazon or www.desertharvest.com V-magic cream - amazon Julva-amazon Vital "V Wild Yam salve ( help moisturize and help with thinning vulvar area, does have Beeswax MoodMaid Botanical Pro-Meno Wild Yam Cream- Amazon Desert Harvest Gele Cleo by Damiva labial moisturizer (Amazon,  Coconut or olive oil aloe   Things to avoid in the vaginal area Do not use things to irritate the vulvar area No lotions just specialized creams for the vulva area- Neogyn, V-magic, No soaps; can use Aveeno or Calendula cleanser if needed. Must be gentle No deodorants No douches Good to sleep without underwear to let the vaginal area to air out No scrubbing: spread the lips to let warm water rinse over labias and pat dry [facility] [address], [address] [place], [place] [address] Phone # [phone] Fax [phone]  

## 2022-05-27 DIAGNOSIS — I119 Hypertensive heart disease without heart failure: Secondary | ICD-10-CM | POA: Diagnosis not present

## 2022-05-28 LAB — BASIC METABOLIC PANEL
BUN/Creatinine Ratio: 19 (ref 12–28)
BUN: 12 mg/dL (ref 8–27)
CO2: 26 mmol/L (ref 20–29)
Calcium: 9.5 mg/dL (ref 8.7–10.3)
Chloride: 100 mmol/L (ref 96–106)
Creatinine, Ser: 0.63 mg/dL (ref 0.57–1.00)
Glucose: 80 mg/dL (ref 70–99)
Potassium: 4.6 mmol/L (ref 3.5–5.2)
Sodium: 138 mmol/L (ref 134–144)
eGFR: 93 mL/min/{1.73_m2} (ref >59)

## 2022-06-12 ENCOUNTER — Encounter: Payer: Self-pay | Admitting: Physical Therapy

## 2022-06-12 ENCOUNTER — Ambulatory Visit: Payer: Medicare PPO | Admitting: Physical Therapy

## 2022-06-12 DIAGNOSIS — M5459 Other low back pain: Secondary | ICD-10-CM

## 2022-06-12 DIAGNOSIS — M6281 Muscle weakness (generalized): Secondary | ICD-10-CM

## 2022-06-12 DIAGNOSIS — R3915 Urgency of urination: Secondary | ICD-10-CM

## 2022-06-17 ENCOUNTER — Ambulatory Visit: Payer: Medicare PPO | Admitting: Physical Therapy

## 2022-06-17 ENCOUNTER — Encounter: Payer: Self-pay | Admitting: Physical Therapy

## 2022-06-17 DIAGNOSIS — M5459 Other low back pain: Secondary | ICD-10-CM

## 2022-06-17 DIAGNOSIS — M6281 Muscle weakness (generalized): Secondary | ICD-10-CM

## 2022-06-17 DIAGNOSIS — R3915 Urgency of urination: Secondary | ICD-10-CM

## 2022-06-17 NOTE — Patient Instructions (Addendum)
Urge Incontinence  Ideal urination frequency is every 2-4 wakeful hours, which equates to 5-8 times within a 24-hour period.   Urge incontinence is leakage that occurs when the bladder muscle contracts, creating a sudden need to go before getting to the bathroom.   Going too often when your bladder isn't actually full can disrupt the body's automatic signals to store and hold urine longer, which will increase urgency/frequency.  In this case, the bladder "is running the show" and strategies can be learned to retrain this pattern.   One should be able to control the first urge to urinate, at around .  The bladder can hold up to a "grande latte," or . To help you gain control, practice the Urge Drill below when urgency strikes.  This drill will help retrain your bladder signals and allow you to store and hold urine longer.  The overall goal is to stretch out your time between voids to reach a more manageable voiding schedule.    Practice your "quick flicks" often throughout the day (each waking hour) even when you don't need feel the urge to go.  This will help strengthen your pelvic floor muscles, making them more effective in controlling leakage.  Urge Drill  When you feel an urge to go, follow these steps to regain control: Stop what you are doing and be still Stand and go on your toes 5x Take one deep breath, directing your air into your abdomen Think an affirming thought, such as "I've got this." Do 5 quick flicks of your pelvic floor Think about something else, do an activity, or something to take your mind off the urge to urinate Walk with control to the bathroom to void, or delay voiding If the urge comes back repeat the steps above till you are able to get to the bathroom.   Bladder irritants Caffeine beverages Alcohol Sodas (carbonated beverages) Spicy foods Citrus Tomatoes  You can add an extra glass of water after the irritant to reduce the urge Try to drink half  your body weight in ounces (68 ounces)  [facility] [address] [place], [place] [address] Phone # [phone] Fax [phone]

## 2022-06-17 NOTE — Therapy (Signed)
OUTPATIENT PHYSICAL THERAPY TREATMENT NOTE   Patient Name: Heather Hoover MRN: [id number] DOB:04-01-1946, 76 y.o., female Today's Date: 06/17/2022  PCP: Susy Frizzle, MD REFERRING PROVIDER: Jaquita Folds, MD  END OF SESSION:   PT End of Session - 06/17/22 0933     Visit Number 3    Date for PT Re-Evaluation 08/17/22    Authorization Type [employer]    Authorization Time Period 6/5-8/28    Authorization - Visit Number 3    Authorization - Number of Visits 12    PT Start Time 0930    PT Stop Time 1010    PT Time Calculation (min) 40 min    Activity Tolerance Patient tolerated treatment well    Behavior During Therapy [facility] for tasks assessed/performed             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   echo   Osteopenia 12/2018   T score -1.6 FRAX 10% / 1.5%   Past Surgical History:  Procedure Laterality Date   ABDOMINAL HYSTERECTOMY  02/07/1981   RSO   APPENDECTOMY     BREAST SURGERY     CARDIAC CATHETERIZATION     when in 3rd grade and age 27   CARPAL TUNNEL RELEASE Left 02/06/2021   Procedure: LEFT CARPAL TUNNEL RELEASE;  Surgeon: Daryll Brod, MD;  Location: [facility];  Service: Orthopedics;  Laterality: Left;  IV REGIONAL FOREARM BLOCK 45 MINUTES   CARPAL TUNNEL RELEASE Right 06/03/2021   Procedure: RIGHT CARPAL TUNNEL RELEASE;  Surgeon: Daryll Brod, MD;  Location: [facility];  Service: Orthopedics;  Laterality: Right;  62 MIN   COSMETIC SURGERY     forehead plastic surgery     MOH's surgery   FRACTURE SURGERY     KNEE ARTHROSCOPY     right   Patient Active Problem List   Diagnosis Date Noted   Spondylosis without myelopathy or radiculopathy, cervical region 05/14/2020   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     Dyslipidemia    Arthritis    Pulmonic stenosis, 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   Hypercholesterolemia 08/17/2011   Benign hypertensive heart disease without heart failure 08/17/2011   REFERRING DIAG: N32.81 (ICD-10-CM) - OAB (overactive bladder)   THERAPY DIAG:  Muscle weakness (generalized)   Urinary urgency   Other low back pain   Rationale for Evaluation and Treatment Rehabilitation   PERTINENT HISTORY: Abdominal hysterectomy 02/07/1981, Cardiac Catherization,    PRECAUTIONS: None   SUBJECTIVE: I am not going to the bathroom as often. I still have the urges.  PAIN:  Are you having pain? Yes NPRS scale: 3/10 Pain location:  left low back   Pain type: aching and dull Pain description: intermittent    Aggravating factors: getting up and move  Relieving factors: getting up and moving around   BOWEL MOVEMENT Pain with bowel movement: No Type of bowel movement:Type (Bristol Stool Scale) Type 3 and 4, Frequency daily, and Strain Yes Fully empty rectum: No Leakage: No Fiber supplement: Yes: stool softner   URINATION Pain with urination: No Fully empty bladder: Yes:   Stream:  weaker  and depends on how full her bladder is Urgency: Yes:   Frequency: sometimes every 2 hours and  drinking caffeine will have to urinate Leakage: Urge to void, Walking to the bathroom, and can be a large leak to change her clothes Pads: Yes: just incase   PATIENT GOALS reduce urgency and leakage   OBJECTIVE: (objective measures completed at initial evaluation unless otherwise dated)   DIAGNOSTIC FINDINGS:  PVR 10 ml   PATIENT SURVEYS: PFIQ-7 UIQ-7 14   COGNITION:            Overall cognitive status: Within functional limits for tasks assessed                              LUMBAR SPECIAL TESTS:  Trendelenburg sign: Positive on the left       POSTURE:  Reduced lumbar lordosis   LUMBARAROM/PROM   A/PROM A/PROM  eval  Flexion Minimal movement in the lumbar and fascial tightness   Extension Decreased by 25%  Right lateral flexion Decreased by 25%  Left rotation Decreased by 25%   (Blank rows = not tested)   LOWER EXTREMITY ROM:   Passive ROM Right eval Left eval  Hip external rotation 50 65   (Blank rows = not tested)   LOWER EXTREMITY MMT:   MMT Right eval Left eval  Hip flexion 4/5 4/5  Hip extension 4/5 4/5  Hip abduction 3/5 2/5  Hip adduction 4/5 4/5    PELVIC MMT:   MMT eval 06/17/2022  Vaginal 1/5 2/5 with almost a lift but better hug of therapist finger  (Blank rows = not tested)         PALPATION:   General  restrictions at the hysterectomy area; contracts the upper instead of lower abdominal;                  External Perineal Exam left ilium is posteriorly rotated, decreased movement of L1-L5, tightness in the lumbar paraspinals                             Internal Pelvic Floor dryness vaginally externally and internally   TONE: increased   PROLAPSE: none   TODAY'S TREATMENT  06/17/2022 Manual: Soft tissue mobilization: Scar tissue mobilization: Myofascial release: Spinal mobilization: Internal pelvic floor techniques:No emotional/communication barriers or cognitive limitation. Patient is motivated to learn. Patient understands and agrees with treatment goals and plan. PT explains patient will be examined in standing, sitting, and lying down to see how their muscles and joints work. When they are ready, they will be asked to remove their underwear so PT can examine their perineum. The patient is also given the option of providing their own chaperone as one is not provided in our facility. The patient also has the right and is explained the right to defer or refuse any part of the evaluation or treatment including the internal exam. With the patient's consent, PT will use one gloved finger to gently assess the muscles of the pelvic floor, seeing how well it contracts and relaxes and if there is muscle symmetry. After, the patient will  get dressed and PT and patient will discuss exam findings and plan of care. PT and patient discuss plan of care, schedule, attendance policy and HEP activities.   Going through the vaginal canal to work on the sides of the introitus, along the perineal body to improve the mobility of the tissue.  Dry needling: Neuromuscular re-education: Core retraining: Core facilitation: Form correction: Pelvic floor contraction  training:While the therapist finger in the vaginal canal to work on the contraction with a lift Down training: Exercises: Stretches/mobility: Strengthening: nustep level 5 fr 5 minutes while assessing patient Therapeutic activities: Functional strengthening activities: Self-care: Educated patient on urge to void and bladder irritants and how it affects her bladder      06/12/2022 Manual: Myofascial release: release of the urogenital diaphragm going through the layers with fingers along the pubic rami Internal pelvic floor techniques:No emotional/communication barriers or cognitive limitation. Patient is motivated to learn. Patient understands and agrees with treatment goals and plan. PT explains patient will be examined in standing, sitting, and lying down to see how their muscles and joints work. When they are ready, they will be asked to remove their underwear so PT can examine their perineum. The patient is also given the option of providing their own chaperone as one is not provided in our facility. The patient also has the right and is explained the right to defer or refuse any part of the evaluation or treatment including the internal exam. With the patient's consent, PT will use one gloved finger to gently assess the muscles of the pelvic floor, seeing how well it contracts and relaxes and if there is muscle symmetry. After, the patient will get dressed and PT and patient will discuss exam findings and plan of care. PT and patient discuss plan of care, schedule, attendance  policy and HEP activities. Going through the vagina working on the sides of the introitus to improve tissue mobility.       Neuromuscular re-education: Core retraining: hookly with ball squeeze contracting the pelvic floor and abdomen 10x                                     Supine hip flexion isometric engaging the pelvic floor and lower abdomen.  Pelvic floor contraction training:while therapist finger in the vaginal canal with patient contracting the pelvic floor and therapist giving tactile cues to pull the muscles together and not compensate with gluteal contraction and bulging the lower abdomen.; sitting pelvic floor contraction on mat to feel the vagina contract Exercises: Strengthening:nustep level 5 fr 5 minutes while assessing patient                                      Therapeutic activities: Functional strengthening activities:contraction of the pelvic floor with not bulging the pelvic floor or abdomen with tactile and verbal cues while laughing.    Self-care: Reviewed vaginal moisturizers and how they are working      PATIENT EDUCATION: 06/17/2022 Education details: Access Code: [email or web address] Person educated: Patient Education method: Explanation, Demonstration, Tactile cues, Verbal cues, and Handouts Education comprehension: verbalized understanding, returned demonstration, verbal cues required, tactile cues required, and needs further education         HOME EXERCISE PROGRAM: [id number] Access Code: [email or web address] URL: [email or web address] Date: 06/17/2022 Prepared by: Earlie Counts  Exercises - Supine Hip Adduction Isometric with Ball  - 1 x daily - 7 x weekly - 3 sets - 10 reps - Hooklying Isometric Hip Flexion  - 1 x daily - 7 x weekly - 1 sets - 10 reps - 5 sec hold - Seated Pelvic Floor Contraction  - 3 x daily - 7 x weekly - 1 sets - 5 reps - 5 sec hold  ASSESSMENT:  CLINICAL IMPRESSION: Patient is a 76 y.o. female who was seen today for physical therapy   treatment for overactive bladder. Patient is able to feel the pelvic floor contraction on the mat and know when she is compensating with the gluteals. She is able to contract the pelvic floor without bulging the abdomen. Pelvic floor strength is 2/5. She is able to hold the contraction for 5 seconds. Patient will benefit from skilled therapy to improve pelvic floor strength and coordination to reduce her leakage and back pain.        OBJECTIVE IMPAIRMENTS decreased activity tolerance, decreased coordination, decreased endurance, decreased ROM, decreased strength, and increased fascial restrictions.    ACTIVITY LIMITATIONS continence, toileting, and locomotion level   PARTICIPATION LIMITATIONS: community activity   PERSONAL FACTORS Abdominal hysterectomy 02/07/1981, Cardiac Catherization,  are also affecting patient's functional outcome.    REHAB POTENTIAL: Excellent   CLINICAL DECISION MAKING: Stable/uncomplicated   EVALUATION COMPLEXITY: Low     GOALS: Goals reviewed with patient? Yes   SHORT TERM GOALS: Target date: 06/22/2022   Patient independent with initial HEP for diaphragmatic breathing and hip stretches.  Baseline: Goal status: INITIAL   2.  Patient understands what vaginal moisturizers are and how they reduce dryness.  Baseline:  Goal status: met 06/12/2022  3.  Patient able to contract her abdominals without bulging the lower abdomen and putting pressure on the pelvic floor.  Baseline:  Goal status: met 06/17/2022   4.  Patient understands ways to control the urge to void.  Baseline:  Goal status: INITIAL   5.  Patent understands what bladder irritants are and how they affect the bladder.  Baseline:  Goal status: INITIAL     LONG TERM GOALS: Target date: 08/17/2022    Patient independent with advanced HEP for core and pelvic floor strength to reduce urinary leakage and low back pain.  Baseline:  Goal status: INITIAL   2.  Patient is able to walk to the bathroom  without leaking urine due to increased pelvic floor strength >/= 3/5 and not bulging her lower abdomen.  Baseline:  Goal status: INITIAL   3.  Patient is able to hear running water and placing her hands in the water with urgency decreased </= 75%.  Baseline:  Goal status: INITIAL   4.  Patient reports her vaginal dryness is >/= 75% better due to using vaginal moisturizers to improve tissue health.  Baseline:  Goal status: INITIAL   5.  Patient reports her low back pain is </= 1-2/10 due to improve lumbar motion.  Baseline:  Goal status: INITIAL       PLAN: PT FREQUENCY: 1x/week   PT DURATION: 12 weeks   PLANNED INTERVENTIONS: Therapeutic exercises, Therapeutic activity, Neuromuscular re-education, Patient/Family education, Joint mobilization, Dry Needling, Electrical stimulation, Spinal mobilization, Moist heat, scar mobilization, Taping, Biofeedback, and Manual therapy   PLAN FOR NEXT SESSION:  hip stretches and back,  diaphragmatic breathing, contracting the pelvic floor without bulging the abdomen and work with laughing,       Earlie Counts, PT 06/17/22 10:17 AM

## 2022-06-21 ENCOUNTER — Other Ambulatory Visit: Payer: Self-pay | Admitting: Family Medicine

## 2022-06-22 NOTE — Telephone Encounter (Signed)
Requested Prescriptions  Pending Prescriptions Disp Refills  . meloxicam (MOBIC) 15 MG tablet [Pharmacy Med Name: MELOXICAM $RemoveBeforeD'15MG'[id number]$  TABLETS] 30 tablet 2    Sig: TAKE 1 TABLET(15 MG) BY MOUTH DAILY. STOP DICLOFENAC     Analgesics:  COX2 Inhibitors Failed - 06/21/2022 12:11 PM      Failed - Manual Review: Labs are only required if the patient has taken medication for more than 8 weeks.      Passed - HGB in normal range and within 360 days    Hemoglobin  Date Value Ref Range Status  06/30/2021 13.4 11.7 - 15.5 g/dL Final         Passed - Cr in normal range and within 360 days    Creat  Date Value Ref Range Status  06/30/2021 0.69 0.60 - 1.00 mg/dL Final   Creatinine, Ser  Date Value Ref Range Status  05/27/2022 0.63 0.57 - 1.00 mg/dL Final         Passed - HCT in normal range and within 360 days    HCT  Date Value Ref Range Status  06/30/2021 41.2 35.0 - 45.0 % Final         Passed - AST in normal range and within 360 days    AST  Date Value Ref Range Status  06/30/2021 18 10 - 35 U/L Final         Passed - ALT in normal range and within 360 days    ALT  Date Value Ref Range Status  06/30/2021 14 6 - 29 U/L Final         Passed - eGFR is 30 or above and within 360 days    GFR, Est African American  Date Value Ref Range Status  04/13/2017 >89 >=60 mL/min Final   GFR calc Af Amer  Date Value Ref Range Status  02/22/2019 103 >59 mL/min/1.73 Final   GFR, Est Non African American  Date Value Ref Range Status  04/13/2017 89 >=60 mL/min Final   GFR calc non Af Amer  Date Value Ref Range Status  02/22/2019 90 >59 mL/min/1.73 Final   GFR  Date Value Ref Range Status  04/04/2015 84.31 >60.00 mL/min Final   eGFR  Date Value Ref Range Status  05/27/2022 93 >59 mL/min/1.73 Final         Passed - Patient is not pregnant      Passed - Valid encounter within last 12 months    Recent Outpatient Visits          8 months ago Recurrent pain of right knee   [place] Susy Frizzle, MD   11 months ago Recurrent pain of right knee   [place] Susy Frizzle, MD   2 years ago Bilateral carpal tunnel syndrome   [place] Susy Frizzle, MD   3 years ago Visit for suture removal   [place] Susy Frizzle, MD   3 years ago General medical exam   [place], Warren T, MD      Future Appointments            In 3 weeks  [place], [place]   In 3 weeks [place], Cammie Mcgee, MD [place]

## 2022-06-24 ENCOUNTER — Encounter: Payer: Self-pay | Admitting: Physical Therapy

## 2022-06-24 ENCOUNTER — Ambulatory Visit: Payer: Medicare PPO | Attending: Obstetrics and Gynecology | Admitting: Physical Therapy

## 2022-06-24 DIAGNOSIS — R3915 Urgency of urination: Secondary | ICD-10-CM | POA: Insufficient documentation

## 2022-06-24 DIAGNOSIS — M5459 Other low back pain: Secondary | ICD-10-CM | POA: Insufficient documentation

## 2022-06-24 DIAGNOSIS — M6281 Muscle weakness (generalized): Secondary | ICD-10-CM | POA: Diagnosis not present

## 2022-06-24 NOTE — Therapy (Signed)
OUTPATIENT PHYSICAL THERAPY TREATMENT NOTE   Patient Name: Heather Hoover MRN: [id number] DOB:04/27/1946, 76 y.o., female Today's Date: 06/24/2022  PCP: Susy Frizzle, MD REFERRING PROVIDER: Jaquita Folds, MD  END OF SESSION:   PT End of Session - 06/24/22 0929     Visit Number 4    Date for PT Re-Evaluation 08/17/22    Authorization Type [employer]    Authorization Time Period 6/5-8/28    Authorization - Visit Number 4    Authorization - Number of Visits 12    PT Start Time 0930    PT Stop Time 1010    PT Time Calculation (min) 40 min    Activity Tolerance Patient tolerated treatment well    Behavior During Therapy [facility] for tasks assessed/performed             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   echo   Osteopenia 12/2018   T score -1.6 FRAX 10% / 1.5%   Past Surgical History:  Procedure Laterality Date   ABDOMINAL HYSTERECTOMY  02/07/1981   RSO   APPENDECTOMY     BREAST SURGERY     CARDIAC CATHETERIZATION     when in 3rd grade and age 54   CARPAL TUNNEL RELEASE Left 02/06/2021   Procedure: LEFT CARPAL TUNNEL RELEASE;  Surgeon: Daryll Brod, MD;  Location: [place];  Service: Orthopedics;  Laterality: Left;  IV REGIONAL FOREARM BLOCK 45 MINUTES   CARPAL TUNNEL RELEASE Right 06/03/2021   Procedure: RIGHT CARPAL TUNNEL RELEASE;  Surgeon: Daryll Brod, MD;  Location: [place];  Service: Orthopedics;  Laterality: Right;  19 MIN   COSMETIC SURGERY     forehead plastic surgery     MOH's surgery   FRACTURE SURGERY     KNEE ARTHROSCOPY     right   Patient Active Problem List   Diagnosis Date Noted   Spondylosis without myelopathy or radiculopathy, cervical region 05/14/2020   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     Dyslipidemia    Arthritis    Pulmonic stenosis, 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   Hypercholesterolemia 08/17/2011   Benign hypertensive heart disease without heart failure 08/17/2011   REFERRING DIAG: N32.81 (ICD-10-CM) - OAB (overactive bladder)   THERAPY DIAG:  Muscle weakness (generalized)   Urinary urgency   Other low back pain   Rationale for Evaluation and Treatment Rehabilitation   PERTINENT HISTORY: Abdominal hysterectomy 02/07/1981, Cardiac Catherization,    PRECAUTIONS: None   SUBJECTIVE: Things are getting better. The urge to void technique is helping. I still have burning when I put the moisturizer in the vaginal area. I am on my 3rd application. Urinary leakage is 50% better. Urgency is 50% better.  PAIN:  Are you having pain? Yes NPRS scale: 3/10 Pain location:  left low back   Pain type: aching and dull Pain description: intermittent    Aggravating factors: getting up and move  Relieving factors: getting up and moving around   BOWEL MOVEMENT Pain with bowel movement: No Type of bowel movement:Type (Bristol Stool Scale) Type 3 and 4, Frequency daily, and Strain Yes Fully empty rectum: No Leakage: No Fiber supplement: Yes: stool softner   URINATION Pain with urination: No Fully empty bladder:  Yes:   Stream:  weaker  and depends on how full her bladder is Urgency: Yes:   Frequency: sometimes every 2 hours and drinking caffeine will have to urinate Leakage: Urge to void, Walking to the bathroom, and can be a large leak to change her clothes Pads: Yes: just incase   PATIENT GOALS reduce urgency and leakage   OBJECTIVE: (objective measures completed at initial evaluation unless otherwise dated)   DIAGNOSTIC FINDINGS:  PVR 10 ml   PATIENT SURVEYS: PFIQ-7 UIQ-7 14   COGNITION:            Overall cognitive status: Within functional limits for tasks assessed                              LUMBAR SPECIAL TESTS:  Trendelenburg sign: Positive on the left        POSTURE:  Reduced lumbar lordosis   LUMBARAROM/PROM   A/PROM A/PROM  eval  Flexion Minimal movement in the lumbar and fascial tightness  Extension Decreased by 25%  Right lateral flexion Decreased by 25%  Left rotation Decreased by 25%   (Blank rows = not tested)   LOWER EXTREMITY ROM:   Passive ROM Right eval Left eval  Hip external rotation 50 65   (Blank rows = not tested)   LOWER EXTREMITY MMT:   MMT Right eval Left eval  Hip flexion 4/5 4/5  Hip extension 4/5 4/5  Hip abduction 3/5 2/5  Hip adduction 4/5 4/5    PELVIC MMT:   MMT eval 06/17/2022  Vaginal 1/5 2/5 with almost a lift but better hug of therapist finger  (Blank rows = not tested)         PALPATION:   General  restrictions at the hysterectomy area; contracts the upper instead of lower abdominal;                  External Perineal Exam left ilium is posteriorly rotated, decreased movement of L1-L5, tightness in the lumbar paraspinals                             Internal Pelvic Floor dryness vaginally externally and internally   TONE: increased   PROLAPSE: none   TODAY'S TREATMENT   06/24/2022 Neuromuscular re-education: Core facilitation:diaphragmatic breathing with contracting the lower abdomen and pelvic floor 10x Supine marching with abdominal and pelvic floor contraction and not letting the pelvis rock.  Bridge 10x with pelvic floor contraction Sitting trunk flexion extension working the abdominals with keeping spinal neutral 10x Sitting ball squeezes with pelvic floor contraction and no gluteal contraction 15x Standing heel raises with pelvic floor contraction Standing hip abduction with keeping hips leveled, working the abdominals and hips 10x each and holding onto chair Standing hip extension with keeping hips leveled and holding onto chair 10x each side Exercises: Stretches/mobility:piriformis stretch in sitting holding 30 sec each Hamstring stretch in sitting holding for 30 seconds  each leg Sitting cat cow 10x Happy baby stretch in sitting for 30 sec    06/17/2022 Manual: Internal pelvic floor techniques:No emotional/communication barriers or cognitive limitation. Patient is motivated to learn. Patient understands and agrees with treatment goals and plan. PT explains patient will be examined in standing, sitting, and lying down to see how their muscles and joints work. When they are ready, they will be asked to remove their underwear so PT can examine their perineum.  The patient is also given the option of providing their own chaperone as one is not provided in our facility. The patient also has the right and is explained the right to defer or refuse any part of the evaluation or treatment including the internal exam. With the patient's consent, PT will use one gloved finger to gently assess the muscles of the pelvic floor, seeing how well it contracts and relaxes and if there is muscle symmetry. After, the patient will get dressed and PT and patient will discuss exam findings and plan of care. PT and patient discuss plan of care, schedule, attendance policy and HEP activities.   Going through the vaginal canal to work on the sides of the introitus, along the perineal body to improve the mobility of the tissue.  Neuromuscular re-education: Pelvic floor contraction training:While the therapist finger in the vaginal canal to work on the contraction with a lift Exercises: Strengthening: nustep level 5 fr 5 minutes while assessing patient Self-care: Educated patient on urge to void and bladder irritants and how it affects her bladder       06/12/2022 Manual: Myofascial release: release of the urogenital diaphragm going through the layers with fingers along the pubic rami Internal pelvic floor techniques:No emotional/communication barriers or cognitive limitation. Patient is motivated to learn. Patient understands and agrees with treatment goals and plan. PT explains patient will  be examined in standing, sitting, and lying down to see how their muscles and joints work. When they are ready, they will be asked to remove their underwear so PT can examine their perineum. The patient is also given the option of providing their own chaperone as one is not provided in our facility. The patient also has the right and is explained the right to defer or refuse any part of the evaluation or treatment including the internal exam. With the patient's consent, PT will use one gloved finger to gently assess the muscles of the pelvic floor, seeing how well it contracts and relaxes and if there is muscle symmetry. After, the patient will get dressed and PT and patient will discuss exam findings and plan of care. PT and patient discuss plan of care, schedule, attendance policy and HEP activities. Going through the vagina working on the sides of the introitus to improve tissue mobility.       Neuromuscular re-education: Core retraining: hookly with ball squeeze contracting the pelvic floor and abdomen 10x                                     Supine hip flexion isometric engaging the pelvic floor and lower abdomen.  Pelvic floor contraction training:while therapist finger in the vaginal canal with patient contracting the pelvic floor and therapist giving tactile cues to pull the muscles together and not compensate with gluteal contraction and bulging the lower abdomen.; sitting pelvic floor contraction on mat to feel the vagina contract Exercises: Strengthening:nustep level 5 fr 5 minutes while assessing patient                                      Therapeutic activities: Functional strengthening activities:contraction of the pelvic floor with not bulging the pelvic floor or abdomen with tactile and verbal cues while laughing.    Self-care: Reviewed vaginal moisturizers and how they are working  PATIENT EDUCATION: 06/24/2022 Education details: Access Code: [email or web address] Person educated:  Patient Education method: Explanation, Demonstration, Tactile cues, Verbal cues, and Handouts Education comprehension: verbalized understanding, returned demonstration, verbal cues required, tactile cues required, and needs further education         HOME EXERCISE PROGRAM: 06/24/2022 Access Code: [email or web address] URL: [email or web address] Date: 06/24/2022 Prepared by: Earlie Counts  Exercises - Supine Hip Adduction Isometric with Ball  - 1 x daily - 7 x weekly - 3 sets - 10 reps - Seated Pelvic Floor Contraction  - 3 x daily - 7 x weekly - 1 sets - 5 reps - 5 sec hold - Seated Piriformis Stretch with Trunk Bend  - 1 x daily - 3 x weekly - 1 sets - 2 reps - 30 sec hold - Seated Hamstring Stretch  - 1 x daily - 3 x weekly - 1 sets - 2 reps - 30 sec hold - Seated Cat Cow  - 1 x daily - 3 x weekly - 1 sets - 10 reps - Seated Happy Baby With Trunk Flexion For Pelvic Relaxation  - 1 x daily - 3 x weekly - 1 sets - 1 reps - 30 sec hold - Supine March  - 1 x daily - 3 x weekly - 1 sets - 10 reps - Supine Bridge with Pelvic Floor Contraction  - 1 x daily - 3 x weekly - 1 sets - 10 reps  ASSESSMENT:   CLINICAL IMPRESSION: Patient is a 76 y.o. female who was seen today for physical therapy  treatment for overactive bladder. Urinary leakage is 50% better. Urgency is 50% better.Patient is able to contract her abdominals with feeling the pelvic floor contract. Patient has learned exercises to engage her abdominal and pelvic floor. She has more difficulty with standing on the left leg to move the right leg in standing due to weakness. Patient will benefit from skilled therapy to improve pelvic floor strength and coordination to reduce her leakage and back pain.        OBJECTIVE IMPAIRMENTS decreased activity tolerance, decreased coordination, decreased endurance, decreased ROM, decreased strength, and increased fascial restrictions.    ACTIVITY LIMITATIONS continence, toileting, and locomotion  level   PARTICIPATION LIMITATIONS: community activity   PERSONAL FACTORS Abdominal hysterectomy 02/07/1981, Cardiac Catherization,  are also affecting patient's functional outcome.    REHAB POTENTIAL: Excellent   CLINICAL DECISION MAKING: Stable/uncomplicated   EVALUATION COMPLEXITY: Low     GOALS: Goals reviewed with patient? Yes   SHORT TERM GOALS: Target date: 06/22/2022   Patient independent with initial HEP for diaphragmatic breathing and hip stretches.  Baseline: Goal status: Met 06/24/2022   2.  Patient understands what vaginal moisturizers are and how they reduce dryness.  Baseline:  Goal status: met 06/12/2022  3.  Patient able to contract her abdominals without bulging the lower abdomen and putting pressure on the pelvic floor.  Baseline:  Goal status: met 06/17/2022   4.  Patient understands ways to control the urge to void.  Baseline:  Goal status: Met 06/24/2022   5.  Patent understands what bladder irritants are and how they affect the bladder.  Baseline:  Goal status: Met 06/24/2022     LONG TERM GOALS: Target date: 08/17/2022    Patient independent with advanced HEP for core and pelvic floor strength to reduce urinary leakage and low back pain.  Baseline:  Goal status: INITIAL   2.  Patient is able to walk to the bathroom without leaking urine due  to increased pelvic floor strength >/= 3/5 and not bulging her lower abdomen.  Baseline:  Goal status: INITIAL   3.  Patient is able to hear running water and placing her hands in the water with urgency decreased </= 75%.  Baseline:  Goal status: INITIAL   4.  Patient reports her vaginal dryness is >/= 75% better due to using vaginal moisturizers to improve tissue health.  Baseline:  Goal status: INITIAL   5.  Patient reports her low back pain is </= 1-2/10 due to improve lumbar motion.  Baseline:  Goal status: INITIAL       PLAN: PT FREQUENCY: 1x/week   PT DURATION: 12 weeks   PLANNED INTERVENTIONS:  Therapeutic exercises, Therapeutic activity, Neuromuscular re-education, Patient/Family education, Joint mobilization, Dry Needling, Electrical stimulation, Spinal mobilization, Moist heat, scar mobilization, Taping, Biofeedback, and Manual therapy   PLAN FOR NEXT SESSION:    work in standing with abdominal  and hip, work with laughing,     Earlie Counts, PT 06/24/22 10:14 AM

## 2022-07-08 ENCOUNTER — Ambulatory Visit: Payer: Medicare PPO | Admitting: Physical Therapy

## 2022-07-08 ENCOUNTER — Encounter: Payer: Self-pay | Admitting: Physical Therapy

## 2022-07-08 DIAGNOSIS — R3915 Urgency of urination: Secondary | ICD-10-CM

## 2022-07-08 DIAGNOSIS — M5459 Other low back pain: Secondary | ICD-10-CM | POA: Diagnosis not present

## 2022-07-08 DIAGNOSIS — M6281 Muscle weakness (generalized): Secondary | ICD-10-CM

## 2022-07-08 NOTE — Therapy (Signed)
OUTPATIENT PHYSICAL THERAPY TREATMENT NOTE   Patient Name: Heather Hoover MRN: [id number] DOB:May 19, 1946, 76 y.o., female 69 Date: 07/08/2022  PCP: Susy Frizzle, MD REFERRING PROVIDER: Jaquita Folds, MD  END OF SESSION:   PT End of Session - 07/08/22 0937     Visit Number 5    Date for PT Re-Evaluation 08/17/22    Authorization Type [employer]    Authorization Time Period 6/5-8/28    Authorization - Visit Number 5    Authorization - Number of Visits 12    PT Start Time 0930    PT Stop Time 1010    PT Time Calculation (min) 40 min    Activity Tolerance Patient tolerated treatment well    Behavior During Therapy [facility] for tasks assessed/performed             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   echo   Osteopenia 12/2018   T score -1.6 FRAX 10% / 1.5%   Past Surgical History:  Procedure Laterality Date   ABDOMINAL HYSTERECTOMY  02/07/1981   RSO   APPENDECTOMY     BREAST SURGERY     CARDIAC CATHETERIZATION     when in 3rd grade and age 37   CARPAL TUNNEL RELEASE Left 02/06/2021   Procedure: LEFT CARPAL TUNNEL RELEASE;  Surgeon: Daryll Brod, MD;  Location: [place];  Service: Orthopedics;  Laterality: Left;  IV REGIONAL FOREARM BLOCK 45 MINUTES   CARPAL TUNNEL RELEASE Right 06/03/2021   Procedure: RIGHT CARPAL TUNNEL RELEASE;  Surgeon: Daryll Brod, MD;  Location: [place];  Service: Orthopedics;  Laterality: Right;  42 MIN   COSMETIC SURGERY     forehead plastic surgery     MOH's surgery   FRACTURE SURGERY     KNEE ARTHROSCOPY     right   Patient Active Problem List   Diagnosis Date Noted   Spondylosis without myelopathy or radiculopathy, cervical region 05/14/2020   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     Dyslipidemia    Arthritis    Pulmonic stenosis, 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   Hypercholesterolemia 08/17/2011   Benign hypertensive heart disease without heart failure 08/17/2011   REFERRING DIAG: N32.81 (ICD-10-CM) - OAB (overactive bladder)   THERAPY DIAG:  Muscle weakness (generalized)   Urinary urgency   Other low back pain   Rationale for Evaluation and Treatment Rehabilitation   PERTINENT HISTORY: Abdominal hysterectomy 02/07/1981, Cardiac Catherization,    PRECAUTIONS: None  No strainn SUBJECTIVE: I fell and the arm of the couch fell into my chest. Hurt to breath, cough. I fell last Wednesday night. No straining for bowel movement.                PAIN:  Are you having pain? Yes NPRS scale: 3/10 Pain location:  left low back   Pain type: aching and dull Pain description: intermittent    Aggravating factors: getting up and move  Relieving factors: getting up and moving around   BOWEL MOVEMENT Pain with bowel movement: No Type of bowel movement:Type (Bristol Stool Scale) Type 3 and 4, Frequency daily, and Strain Yes Fully empty rectum: No Leakage: No Fiber supplement: Yes: stool softner   URINATION Pain with urination: No Fully  empty bladder: Yes:   Stream:  weaker  and depends on how full her bladder is Urgency: Yes:   Frequency: sometimes every 2 hours and drinking caffeine will have to urinate Leakage: Urge to void, Walking to the bathroom, and can be a large leak to change her clothes Pads: Yes: just incase   PATIENT GOALS reduce urgency and leakage   OBJECTIVE: (objective measures completed at initial evaluation unless otherwise dated)   DIAGNOSTIC FINDINGS:  PVR 10 ml   PATIENT SURVEYS: PFIQ-7 UIQ-7 14   COGNITION:            Overall cognitive status: Within functional limits for tasks assessed                              LUMBAR SPECIAL TESTS:  Trendelenburg sign: Positive on the left       POSTURE:  Reduced lumbar lordosis    LUMBARAROM/PROM   A/PROM A/PROM  eval  Flexion Minimal movement in the lumbar and fascial tightness  Extension Decreased by 25%  Right lateral flexion Decreased by 25%  Left rotation Decreased by 25%   (Blank rows = not tested)   LOWER EXTREMITY ROM:   Passive ROM Right eval Left eval  Hip external rotation 50 65   (Blank rows = not tested)   LOWER EXTREMITY MMT:   MMT Right eval Left eval Right 07/08/2022 Left 07/08/2022  Hip flexion 4/5 4/5 4+/5 4+/5  Hip extension 4/5 4/5 4/5 4/5  Hip abduction 3/5 2/5 3+/5 3-/5  Hip adduction 4/5 4/5 4/5 4/5    PELVIC MMT:   MMT eval 06/17/2022  Vaginal 1/5 2/5 with almost a lift but better hug of therapist finger  (Blank rows = not tested)         PALPATION:   General  restrictions at the hysterectomy area; contracts the upper instead of lower abdominal;                  External Perineal Exam left ilium is posteriorly rotated, decreased movement of L1-L5, tightness in the lumbar paraspinals                             Internal Pelvic Floor dryness vaginally externally and internally   TONE: increased   PROLAPSE: none   TODAY'S TREATMENT  07/08/2022 Neuromuscular re-education: Core retraining: Core facilitation:standing marching keeping pelvis leveled needing to keep hand on wall 10x each leg Walking sideways with red band around knees holding onto wall and keeping hips leveled Sit to stand with red band around knees 15x Sitting clamshell with red band  20x      06/24/2022 Neuromuscular re-education: Core facilitation:diaphragmatic breathing with contracting the lower abdomen and pelvic floor 10x Supine marching with abdominal and pelvic floor contraction and not letting the pelvis rock.  Bridge 10x with pelvic floor contraction Sitting trunk flexion extension working the abdominals with keeping spinal neutral 10x Sitting ball squeezes with pelvic floor contraction and no gluteal contraction 15x Standing heel raises  with pelvic floor contraction Standing hip abduction with keeping hips leveled, working the abdominals and hips 10x each and holding onto chair Standing hip extension with keeping hips leveled and holding onto chair 10x each side Exercises: Stretches/mobility:piriformis stretch in sitting holding 30 sec each Hamstring stretch in sitting holding for 30 seconds each leg Sitting cat cow 10x Happy baby stretch in sitting for  30 sec    06/17/2022 Manual: Internal pelvic floor techniques:No emotional/communication barriers or cognitive limitation. Patient is motivated to learn. Patient understands and agrees with treatment goals and plan. PT explains patient will be examined in standing, sitting, and lying down to see how their muscles and joints work. When they are ready, they will be asked to remove their underwear so PT can examine their perineum. The patient is also given the option of providing their own chaperone as one is not provided in our facility. The patient also has the right and is explained the right to defer or refuse any part of the evaluation or treatment including the internal exam. With the patient's consent, PT will use one gloved finger to gently assess the muscles of the pelvic floor, seeing how well it contracts and relaxes and if there is muscle symmetry. After, the patient will get dressed and PT and patient will discuss exam findings and plan of care. PT and patient discuss plan of care, schedule, attendance policy and HEP activities.   Going through the vaginal canal to work on the sides of the introitus, along the perineal body to improve the mobility of the tissue.  Neuromuscular re-education: Pelvic floor contraction training:While the therapist finger in the vaginal canal to work on the contraction with a lift Exercises: Strengthening: nustep level 5 fr 5 minutes while assessing patient Self-care: Educated patient on urge to void and bladder irritants and how it affects her  bladder       06/12/2022 Manual: Myofascial release: release of the urogenital diaphragm going through the layers with fingers along the pubic rami Internal pelvic floor techniques:No emotional/communication barriers or cognitive limitation. Patient is motivated to learn. Patient understands and agrees with treatment goals and plan. PT explains patient will be examined in standing, sitting, and lying down to see how their muscles and joints work. When they are ready, they will be asked to remove their underwear so PT can examine their perineum. The patient is also given the option of providing their own chaperone as one is not provided in our facility. The patient also has the right and is explained the right to defer or refuse any part of the evaluation or treatment including the internal exam. With the patient's consent, PT will use one gloved finger to gently assess the muscles of the pelvic floor, seeing how well it contracts and relaxes and if there is muscle symmetry. After, the patient will get dressed and PT and patient will discuss exam findings and plan of care. PT and patient discuss plan of care, schedule, attendance policy and HEP activities. Going through the vagina working on the sides of the introitus to improve tissue mobility.       Neuromuscular re-education: Core retraining: hookly with ball squeeze contracting the pelvic floor and abdomen 10x                                     Supine hip flexion isometric engaging the pelvic floor and lower abdomen.  Pelvic floor contraction training:while therapist finger in the vaginal canal with patient contracting the pelvic floor and therapist giving tactile cues to pull the muscles together and not compensate with gluteal contraction and bulging the lower abdomen.; sitting pelvic floor contraction on mat to feel the vagina contract Exercises: Strengthening:nustep level 5 fr 5 minutes while assessing patient  Therapeutic activities: Functional strengthening activities:contraction of the pelvic floor with not bulging the pelvic floor or abdomen with tactile and verbal cues while laughing.    Self-care: Reviewed vaginal moisturizers and how they are working      PATIENT EDUCATION: 06/24/2022 Education details: Access Code: [email or web address] Person educated: Patient Education method: Explanation, Demonstration, Tactile cues, Verbal cues, and Handouts Education comprehension: verbalized understanding, returned demonstration, verbal cues required, tactile cues required, and needs further education         HOME EXERCISE PROGRAM: 07/08/2022  Access Code: [email or web address] URL: [email or web address] Date: 07/08/2022 Prepared by: Earlie Counts  Exercises -- Standing Marching  - 1 x daily - 3 x weekly - 1 sets - 10 reps - Side Stepping with Resistance at Ankles  - 1 x daily - 3 x weekly - 4 sets - 10 reps - Sit to Stand with Resistance Around Legs  - 1 x daily - 3 x weekly - 1 sets - 10 reps - Seated Hip Abduction with Resistance  - 1 x daily - 3 x weekly - 2 sets - 10 reps ASSESSMENT:   CLINICAL IMPRESSION: Patient is a 76 y.o. female who was seen today for physical therapy  treatment for overactive bladder. Patient is not straining to have a bowel movement.  Patient is doing well with vaginal moisture. No more vaginal burning. Patient has not been leaking urine since last visit. Patient report her urgency is 80% better. Patient will drop her right hip when standing on the left and needs verbal cues to correct. She has to hold onto the wall with standing exercises due to weakness in left hip. Patient will benefit from skilled therapy to improve pelvic floor strength and coordination to reduce her leakage and back pain.        OBJECTIVE IMPAIRMENTS decreased activity tolerance, decreased coordination, decreased endurance, decreased ROM, decreased strength, and increased fascial restrictions.     ACTIVITY LIMITATIONS continence, toileting, and locomotion level   PARTICIPATION LIMITATIONS: community activity   PERSONAL FACTORS Abdominal hysterectomy 02/07/1981, Cardiac Catherization,  are also affecting patient's functional outcome.    REHAB POTENTIAL: Excellent   CLINICAL DECISION MAKING: Stable/uncomplicated   EVALUATION COMPLEXITY: Low     GOALS: Goals reviewed with patient? Yes   SHORT TERM GOALS: Target date: 06/22/2022   Patient independent with initial HEP for diaphragmatic breathing and hip stretches.  Baseline: Goal status: Met 06/24/2022   2.  Patient understands what vaginal moisturizers are and how they reduce dryness.  Baseline:  Goal status: met 06/12/2022  3.  Patient able to contract her abdominals without bulging the lower abdomen and putting pressure on the pelvic floor.  Baseline:  Goal status: met 06/17/2022   4.  Patient understands ways to control the urge to void.  Baseline:  Goal status: Met 06/24/2022   5.  Patent understands what bladder irritants are and how they affect the bladder.  Baseline:  Goal status: Met 06/24/2022     LONG TERM GOALS: Target date: 08/17/2022    Patient independent with advanced HEP for core and pelvic floor strength to reduce urinary leakage and low back pain.  Baseline:  Goal status: INITIAL   2.  Patient is able to walk to the bathroom without leaking urine due to increased pelvic floor strength >/= 3/5 and not bulging her lower abdomen.  Baseline:  Goal status: INITIAL   3.  Patient is able to hear running water and placing her hands in the water with urgency decreased </=  75%.  Baseline:  Goal status: INITIAL   4.  Patient reports her vaginal dryness is >/= 75% better due to using vaginal moisturizers to improve tissue health.  Baseline:  Goal status: Met 07/08/2022  5.  Patient reports her low back pain is </= 1-2/10 due to improve lumbar motion.  Baseline:  Goal status: INITIAL       PLAN: PT  FREQUENCY: 1x/week   PT DURATION: 12 weeks   PLANNED INTERVENTIONS: Therapeutic exercises, Therapeutic activity, Neuromuscular re-education, Patient/Family education, Joint mobilization, Dry Needling, Electrical stimulation, Spinal mobilization, Moist heat, scar mobilization, Taping, Biofeedback, and Manual therapy   PLAN FOR NEXT SESSION:    work in standing with abdominal  and hip, work with laughing,     Earlie Counts, PT 07/08/22 10:10 AM

## 2022-07-09 ENCOUNTER — Emergency Department (HOSPITAL_BASED_OUTPATIENT_CLINIC_OR_DEPARTMENT_OTHER)
Admission: EM | Admit: 2022-07-09 | Discharge: 2022-07-09 | Disposition: A | Discharge status: Home / Self Care | Payer: Medicare PPO | Attending: Emergency Medicine | Admitting: Emergency Medicine

## 2022-07-09 ENCOUNTER — Telehealth: Payer: Self-pay

## 2022-07-09 ENCOUNTER — Encounter (HOSPITAL_BASED_OUTPATIENT_CLINIC_OR_DEPARTMENT_OTHER): Payer: Self-pay

## 2022-07-09 ENCOUNTER — Emergency Department (HOSPITAL_BASED_OUTPATIENT_CLINIC_OR_DEPARTMENT_OTHER): Payer: Medicare PPO

## 2022-07-09 DIAGNOSIS — S0033XA Contusion of nose, initial encounter: Secondary | ICD-10-CM | POA: Diagnosis not present

## 2022-07-09 DIAGNOSIS — Y92832 Beach as the place of occurrence of the external cause: Secondary | ICD-10-CM | POA: Diagnosis not present

## 2022-07-09 DIAGNOSIS — R0789 Other chest pain: Secondary | ICD-10-CM | POA: Insufficient documentation

## 2022-07-09 DIAGNOSIS — W010XXA Fall on same level from slipping, tripping and stumbling without subsequent striking against object, initial encounter: Secondary | ICD-10-CM | POA: Insufficient documentation

## 2022-07-09 DIAGNOSIS — R079 Chest pain, unspecified: Secondary | ICD-10-CM | POA: Diagnosis not present

## 2022-07-09 DIAGNOSIS — I10 Essential (primary) hypertension: Secondary | ICD-10-CM | POA: Diagnosis not present

## 2022-07-09 DIAGNOSIS — Z79899 Other long term (current) drug therapy: Secondary | ICD-10-CM | POA: Diagnosis not present

## 2022-07-09 MED ORDER — HYDROCODONE-ACETAMINOPHEN 5-325 MG PO TABS: 1.0000 | ORAL_TABLET | Freq: Four times a day (QID) | ORAL | 0 refills | Status: AC | PRN

## 2022-07-09 MED ORDER — LIDOCAINE 5 % EX PTCH: 1.0000 | MEDICATED_PATCH | CUTANEOUS | 0 refills | Status: DC

## 2022-07-09 NOTE — ED Provider Notes (Signed)
[place] EMERGENCY DEPT Provider Note   CSN: [id number] Arrival date & time: 07/09/22  1546     History  Chief Complaint  Patient presents with   Chest Pain    SEQUIA TOLLEFSEN is a 76 y.o. female.  Yenia Rigano is a 76 year old female with a past medical history of hypertension and pulmonic stenosis who presents with persistent sternal chest pain after a fall 4 days ago.  Patient states she tripped and fell onto a couch and the brunt of the fall was caught by her chest onto the arm of the sofa.  She also hit her nose on a side table which bled for about 10 minutes until it stopped on its own and has not bled since then.  She denied any dizziness, lightheadedness, shortness of breath, heart palpitations, syncope before, after, or since the fall.  She is trying to take Tylenol with codeine for the pain which initially relieved it but today did not relieve it as much.     Chest Pain Associated symptoms: no abdominal pain, no cough, no dizziness, no fever, no headache, no nausea, no palpitations, no shortness of breath and no vomiting        Home Medications Prior to Admission medications   Medication Sig Start Date End Date Taking? Authorizing Provider  HYDROcodone-acetaminophen (NORCO/VICODIN) 5-325 MG tablet Take 1 tablet by mouth every 6 (six) hours as needed for up to 5 days for moderate pain. 07/09/22 07/14/22 Yes Johny Blamer, DO  lidocaine (LIDODERM) 5 % Place 1 patch onto the skin daily for 14 days. Remove & Discard patch within 12 hours or as directed by MD 07/09/22 07/23/22 Yes Johny Blamer, DO  acetaminophen (TYLENOL) 325 MG tablet Take 650 mg by mouth every 6 (six) hours as needed for mild pain or headache.    [provider]  carvedilol (COREG) 6.25 MG tablet TAKE 1 TABLET(6.25 MG) BY MOUTH TWICE DAILY WITH A MEAL 10/01/21   Skeet Latch, MD  Cholecalciferol (VITAMIN D-3 PO) Take 2,000 Units by mouth daily. Taking 2000 daily     [provider]  EFUDEX 5 % cream  10/01/21   [provider]  Famotidine (PEPCID PO) Take by mouth.    [provider]  meloxicam (MOBIC) 15 MG tablet TAKE 1 TABLET(15 MG) BY MOUTH DAILY. STOP DICLOFENAC 06/22/22   Susy Frizzle, MD  polyvinyl alcohol (LIQUIFILM TEARS) 1.4 % ophthalmic solution Place 1 drop into both eyes as needed for dry eyes.    [provider]  potassium chloride (KLOR-CON) 10 MEQ tablet TAKE 1 TABLET(10 MEQ) BY MOUTH DAILY 10/31/21   Skeet Latch, MD  PREVIDENT 5000 DRY MOUTH 1.1 % GEL dental gel Place onto teeth. 02/20/22   [provider]  rosuvastatin (CRESTOR) 20 MG tablet TAKE 1 TABLET(20 MG) BY MOUTH DAILY 10/22/21   Skeet Latch, MD  valsartan (DIOVAN) 160 MG tablet Take 1 tablet (160 mg total) by mouth daily. 05/20/22   Skeet Latch, MD      Allergies    Augmentin [amoxicillin-pot clavulanate], Niacin-lovastatin er, and Kenalog [triamcinolone]    Review of Systems   Review of Systems  Constitutional:  Negative for chills and fever.  Eyes:  Negative for visual disturbance.  Respiratory:  Negative for cough and shortness of breath.   Cardiovascular:  Positive for chest pain. Negative for palpitations and leg swelling.  Gastrointestinal:  Negative for abdominal pain, nausea and vomiting.  Neurological:  Negative for dizziness, syncope, light-headedness and headaches.  Physical Exam Updated Vital Signs BP (!) 160/73 (BP Location: Left Arm)   Pulse 64   Temp 98.2 F (36.8 C) (Oral)   Resp 17   SpO2 100%  Physical Exam Constitutional:      General: She is not in acute distress. HENT:     Head: Normocephalic.     Comments: Small contusion on the tip of the nose. Cardiovascular:     Rate and Rhythm: Normal rate and regular rhythm.     Pulses:          Radial pulses are 2+ on the right side and 2+ on the left side.  Pulmonary:     Effort: Pulmonary effort is normal.     Breath sounds: Normal  breath sounds.  Chest:     Chest wall: Tenderness (Tenderness to palpation over lower half of sternum.  No tenderness to palpation over ribs both anteriorly and posteriorly bilaterally) present. No deformity or crepitus.  Abdominal:     Palpations: Abdomen is soft.     Tenderness: There is no abdominal tenderness.  Musculoskeletal:     Cervical back: Neck supple. No spinous process tenderness or muscular tenderness.     Right lower leg: No edema.     Left lower leg: No edema.  Skin:    Capillary Refill: Capillary refill takes less than 2 seconds.  Neurological:     Mental Status: She is alert.     ED Results / Procedures / Treatments   Labs (all labs ordered are listed, but only abnormal results are displayed) Labs Reviewed - No data to display  EKG None  Radiology DG Chest Portable 1 View  Result Date: 07/09/2022 CLINICAL DATA:  Fall.  Chest pain. EXAM: PORTABLE CHEST 1 VIEW COMPARISON:  None FINDINGS: Heart size and mediastinal contours appear normal. No pleural effusion or edema. Scar like opacity noted within the right lateral lung base. No airspace opacities identified. Visualized osseous structures are intact. IMPRESSION: 1. No acute cardiopulmonary abnormalities. 2. Right lateral lung base scar. Electronically Signed   By: Signa Kell M.D.   On: 07/09/2022 17:19    Procedures Procedures    Medications Ordered in ED Medications - No data to display  ED Course/ Medical Decision Making/ A&P                           Medical Decision Making Stephnie Parlier is a 76 year old female with past medical history of hypertension and pulmonic stenosis who presents with chest wall pain after a fall few days ago.  History and physical along with chest x-ray are most consistent with musculoskeletal pain.  Patient was offered a CT scan of her chest as well as further evaluation for chest pain.  Through shared decision making we decided that this did not necessitate further work-up  at this time and she was counseled on symptoms to watch for that would necessitate further work-up in the emergency department.  Patient was discharged with a 7-day supply of Norco and lidocaine patches.  Patient was discharged in stable condition and all questions were answered prior to discharge.  Problems Addressed: Chest pain, musculoskeletal: acute illness or injury  Amount and/or Complexity of Data Reviewed Radiology: ordered and independent interpretation performed. Decision-making details documented in ED Course.  Risk Prescription drug management.          Final Clinical Impression(s) / ED Diagnoses Final diagnoses:  Chest pain, musculoskeletal    Rx / DC  Orders ED Discharge Orders          Ordered    HYDROcodone-acetaminophen (NORCO/VICODIN) 5-325 MG tablet  Every 6 hours PRN        07/09/22 2127    lidocaine (LIDODERM) 5 %  Every 24 hours        07/09/22 2127              Rocky Morel, DO 07/09/22 2206    Tegeler, Canary Brim, MD 07/10/22 1118

## 2022-07-09 NOTE — Telephone Encounter (Signed)
Pt called stated  that she fell about a week ago on the couch landed on her chest. Pt said that she has slight pain when doing things.   Encourage pt to go to UC/or ED, pt voiced understanding.

## 2022-07-09 NOTE — Discharge Instructions (Addendum)
You were seen in the emergency department for your chest pain which started after you fell.  Your chest x-ray was reassuring that there were no rib fractures or a sternal fracture.  We offered to do further work-up including a CT scan but you preferred to hold off on this.  We think this is most likely musculoskeletal pain and recommend pain medication, rest and pulmonary exercises which you could use a deep breathing exerciser for.  Please take hydrocodone acetaminophen every 6 hours for pain and use the lidocaine patches over the sternum for pain control.  If you experience any worsening pain, shortness of breath, productive cough, coughing up blood, new chest pain, or other concerning symptoms please return to the emergency department.

## 2022-07-09 NOTE — ED Triage Notes (Signed)
Pt presents POV from home d/t chest pain from [place]  Pt reports taking some Codeine syrup and was able to get some sleep and relief that one night

## 2022-07-10 ENCOUNTER — Other Ambulatory Visit: Payer: Self-pay

## 2022-07-10 DIAGNOSIS — Z1211 Encounter for screening for malignant neoplasm of colon: Secondary | ICD-10-CM | POA: Insufficient documentation

## 2022-07-10 DIAGNOSIS — K219 Gastro-esophageal reflux disease without esophagitis: Secondary | ICD-10-CM | POA: Insufficient documentation

## 2022-07-10 DIAGNOSIS — I119 Hypertensive heart disease without heart failure: Secondary | ICD-10-CM

## 2022-07-10 DIAGNOSIS — R634 Abnormal weight loss: Secondary | ICD-10-CM | POA: Insufficient documentation

## 2022-07-10 DIAGNOSIS — E7800 Pure hypercholesterolemia, unspecified: Secondary | ICD-10-CM

## 2022-07-13 ENCOUNTER — Other Ambulatory Visit: Payer: Medicare PPO

## 2022-07-13 DIAGNOSIS — E785 Hyperlipidemia, unspecified: Secondary | ICD-10-CM | POA: Diagnosis not present

## 2022-07-13 DIAGNOSIS — I119 Hypertensive heart disease without heart failure: Secondary | ICD-10-CM | POA: Diagnosis not present

## 2022-07-13 DIAGNOSIS — E7800 Pure hypercholesterolemia, unspecified: Secondary | ICD-10-CM | POA: Diagnosis not present

## 2022-07-14 ENCOUNTER — Encounter: Payer: Self-pay | Admitting: Pharmacist Clinician (PhC)/ Clinical Pharmacy Specialist

## 2022-07-14 ENCOUNTER — Ambulatory Visit (INDEPENDENT_AMBULATORY_CARE_PROVIDER_SITE_OTHER): Payer: Medicare PPO | Admitting: Pharmacist Clinician (PhC)/ Clinical Pharmacy Specialist

## 2022-07-14 DIAGNOSIS — I119 Hypertensive heart disease without heart failure: Secondary | ICD-10-CM | POA: Diagnosis not present

## 2022-07-14 LAB — CBC WITH DIFFERENTIAL/PLATELET
Absolute Monocytes: 374 cells/uL (ref 200–950)
Basophils Absolute: 30 cells/uL (ref 0–200)
Basophils Relative: 0.8 %
Eosinophils Absolute: 81 cells/uL (ref 15–500)
Eosinophils Relative: 2.2 %
HCT: 39.9 % (ref 35.0–45.0)
Hemoglobin: 13.3 g/dL (ref 11.7–15.5)
Lymphs Abs: 1021 cells/uL (ref 850–3900)
MCH: 31.7 pg (ref 27.0–33.0)
MCHC: 33.3 g/dL (ref 32.0–36.0)
MCV: 95.2 fL (ref 80.0–100.0)
MPV: 9.9 fL (ref 7.5–12.5)
Monocytes Relative: 10.1 %
Neutro Abs: 2194 cells/uL (ref 1500–7800)
Neutrophils Relative %: 59.3 %
Platelets: 147 10*3/uL (ref 140–400)
RBC: 4.19 10*6/uL (ref 3.80–5.10)
RDW: 11.8 % (ref 11.0–15.0)
Total Lymphocyte: 27.6 %
WBC: 3.7 10*3/uL — ABNORMAL LOW (ref 3.8–10.8)

## 2022-07-14 LAB — COMPREHENSIVE METABOLIC PANEL
AG Ratio: 1.8 (calc) (ref 1.0–2.5)
ALT: 11 U/L (ref 6–29)
AST: 17 U/L (ref 10–35)
Albumin: 4.2 g/dL (ref 3.6–5.1)
Alkaline phosphatase (APISO): 61 U/L (ref 37–153)
BUN/Creatinine Ratio: 34 (calc) — ABNORMAL HIGH (ref 6–22)
BUN: 19 mg/dL (ref 7–25)
CO2: 30 mmol/L (ref 20–32)
Calcium: 9.5 mg/dL (ref 8.6–10.4)
Chloride: 100 mmol/L (ref 98–110)
Creat: 0.56 mg/dL — ABNORMAL LOW (ref 0.60–1.00)
Globulin: 2.4 g/dL (calc) (ref 1.9–3.7)
Glucose, Bld: 100 mg/dL — ABNORMAL HIGH (ref 65–99)
Potassium: 5.3 mmol/L (ref 3.5–5.3)
Sodium: 137 mmol/L (ref 135–146)
Total Bilirubin: 0.4 mg/dL (ref 0.2–1.2)
Total Protein: 6.6 g/dL (ref 6.1–8.1)

## 2022-07-14 LAB — LIPID PANEL
Cholesterol: 147 mg/dL (ref <200)
HDL: 72 mg/dL (ref >=50)
LDL Cholesterol (Calc): 58 mg/dL (calc)
Non-HDL Cholesterol (Calc): 75 mg/dL (calc) (ref <130)
Total CHOL/HDL Ratio: 2 (calc) (ref <5.0)
Triglycerides: 89 mg/dL (ref <150)

## 2022-07-14 NOTE — Assessment & Plan Note (Addendum)
Patient with essential hypertension, with well controlled readings at home.  Readings have spiked up somewhat over the past 10 days, as she had a fall which caused bruising on her chest wall.  No changes to medications today and she will continue with regular monitoring.  She can move the valsartan to evenings to see if the afternoon drowsiness goes away.  Scheduled for echo on Sept 1, then will see Dr. Duke Salvia for follow up.  Encouraged patient to take home readings to that appointment.

## 2022-07-14 NOTE — Progress Notes (Signed)
07/14/2022 Heather Hoover [birth year] [id number]   HPI:  Heather Hoover is a 76 y.o. female referred by Dr. Duke Salvia to HTN clinic. In addition to hypertension, her medical history is significant for hyperlipidemia (LDD 68 on rosuvastatin 20) and congenital pulmonic stenosis.  She was seen by Dr. Duke Salvia last fall, at which time her BP was 190/82.  Because home readings were 120-130's, she was asked to continue with carvedilol and return in 1-2 weeks with home meter.  She then saw Laural Golden PharmD within a week.  Office reading was equally elevated (196/82), and home cuff read 182/84.  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  He suggested that she work on sodium reduction, caffeine free drinks and water aerobics for exercise.  She was given valsartan 40 mg and asked to report back via My Chart as to how her home readings were doing.   She reported readings in November and was doing well. In May of this year valsartan was in creased to 80 mg then to 160 mg.    Today she is in the office for follow up. [place] the beach when she tripped and fell onto the arm of a couch.  Her chest came into contact with the couch arm and has left her with an underlying bruise that has been quite painful.  She went to the ED after several days, when it didn't seem to be getting better.  No broken or cracked ribs/sternum, just a deep bruise.  She was given some hydrocodone for pain and notes that she did have to take one this morning about 2 am.  She does note feeling tired in the middle of the day, and wonders if the valsartan is the cause.    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;  Diet: more home  cooked meals, does some take and bake; Weight watchers plan; plenty of f/v, mostly fresh, steamed or occasionally sauteed; not adding salt, but some processed foods  Exercise: doing PT for urinary incontinence  Home BP readings:   31 readings prior to fall - average 125/62  HR 62  (range 113-141/56-68)  10 readings since fall - average  144/68  HR 61  (range 129-166/62-74)  From last visit average 141/68      Intolerances: hctz - dry mouth; carvedilol - fatigue  Labs:  9/22:  Na 136, K 5.1, Glu 92, BUN 12, SCr 0.6, GFR 94   Wt Readings from Last 3 Encounters:  05/11/22 161 lb 6.4 oz (73.2 kg)  04/15/22 155 lb (70.3 kg)  01/30/22 154 lb (69.9 kg)   BP Readings from Last 3 Encounters:  07/14/22 (!) 156/84  07/09/22 (!) 160/73  05/11/22 (!) 166/80   Pulse Readings from Last 3 Encounters:  07/09/22 64  05/11/22 70  04/15/22 61    Current Outpatient Medications  Medication Sig Dispense Refill   acetaminophen (TYLENOL) 325 MG tablet Take 650 mg by mouth every 6 (six) hours as needed for mild pain or headache.     carvedilol (COREG) 6.25 MG tablet TAKE 1 TABLET(6.25 MG) BY MOUTH TWICE DAILY WITH A MEAL 180 tablet 3   Cholecalciferol (VITAMIN D-3 PO)  Take 2,000 Units by mouth daily. Taking 2000 daily     HYDROcodone-acetaminophen (NORCO/VICODIN) 5-325 MG tablet Take 1 tablet by mouth every 6 (six) hours as needed for up to 5 days for moderate pain. 20 tablet 0   meloxicam (MOBIC) 15 MG tablet TAKE 1 TABLET(15 MG) BY MOUTH DAILY. STOP DICLOFENAC 30 tablet 2   potassium chloride (KLOR-CON) 10 MEQ tablet TAKE 1 TABLET(10 MEQ) BY MOUTH DAILY 90 tablet 2   rosuvastatin (CRESTOR) 20 MG tablet TAKE 1 TABLET(20 MG) BY MOUTH DAILY 90 tablet 3   valsartan (DIOVAN) 160 MG tablet Take 1 tablet (160 mg total) by mouth daily. 90 tablet 3   EFUDEX 5 % cream      Famotidine (PEPCID PO) Take by mouth.     polyvinyl alcohol (LIQUIFILM TEARS) 1.4 % ophthalmic solution Place 1 drop into both eyes as  needed for dry eyes.     PREVIDENT 5000 DRY MOUTH 1.1 % GEL dental gel Place onto teeth.     No current facility-administered medications for this visit.    Allergies  Allergen Reactions   Augmentin [Amoxicillin-Pot Clavulanate] Diarrhea    Did it involve swelling of the face/tongue/throat, SOB, or low BP? yes Did it involve sudden or severe rash/hives, skin peeling, or any reaction on the inside of your mouth or nose? no Did you need to seek medical attention at a hospital or doctor's office? no When did it last happen?      2000 If all above answers are "NO", may proceed with cephalosporin use.    Niacin-Lovastatin Er     Flushing,itching,syncope   Kenalog [Triamcinolone] Palpitations    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   echo   Osteopenia 12/2018   T score -1.6 FRAX 10% / 1.5%    Blood pressure (!) 156/84.  Benign hypertensive heart disease without heart failure Patient with essential hypertension, with well controlled readings at home.  Readings have spiked up somewhat over the past 10 days, as she had a fall which caused bruising on her chest wall.  No changes to medications today and she will continue with regular monitoring.  She can move the valsartan to evenings to see if the afternoon drowsiness goes away.  Scheduled for echo on Sept 1, then will see Dr. Duke Salvia for follow up.  Encouraged patient to take home readings to that appointment.     Phillips Hay PharmD CPP [facility] [address] [place], [place] [address] [phone]

## 2022-07-14 NOTE — Patient Instructions (Signed)
Return for a a follow up appointment - call to schedule appointment with Dr. Duke Salvia  Check your blood pressure at home daily and keep record of the readings.  Take your BP meds as follows:  Continue with current medications.  You can move the valsartan to evenings to see if that helps.    Bring all of your meds, your BP cuff and your record of home blood pressures to your next appointment.  Exercise as you're able, try to walk approximately 30 minutes per day.  Keep salt intake to a minimum, especially watch canned and prepared boxed foods.  Eat more fresh fruits and vegetables and fewer canned items.  Avoid eating in fast food restaurants.    HOW TO TAKE YOUR BLOOD PRESSURE: Rest 5 minutes before taking your blood pressure.  Don't smoke or drink caffeinated beverages for at least 30 minutes before. Take your blood pressure before (not after) you eat. Sit comfortably with your back supported and both feet on the floor (don't cross your legs). Elevate your arm to heart level on a table or a desk. Use the proper sized cuff. It should fit smoothly and snugly around your bare upper arm. There should be enough room to slip a fingertip under the cuff. The bottom edge of the cuff should be 1 inch above the crease of the elbow. Ideally, take 3 measurements at one sitting and record the average

## 2022-07-15 DIAGNOSIS — L821 Other seborrheic keratosis: Secondary | ICD-10-CM | POA: Diagnosis not present

## 2022-07-15 DIAGNOSIS — D1801 Hemangioma of skin and subcutaneous tissue: Secondary | ICD-10-CM | POA: Diagnosis not present

## 2022-07-15 DIAGNOSIS — D2262 Melanocytic nevi of left upper limb, including shoulder: Secondary | ICD-10-CM | POA: Diagnosis not present

## 2022-07-15 DIAGNOSIS — Z85828 Personal history of other malignant neoplasm of skin: Secondary | ICD-10-CM | POA: Diagnosis not present

## 2022-07-15 DIAGNOSIS — L814 Other melanin hyperpigmentation: Secondary | ICD-10-CM | POA: Diagnosis not present

## 2022-07-17 ENCOUNTER — Ambulatory Visit (INDEPENDENT_AMBULATORY_CARE_PROVIDER_SITE_OTHER): Payer: Medicare PPO | Admitting: Family Medicine

## 2022-07-17 ENCOUNTER — Encounter: Payer: Medicare PPO | Admitting: Family Medicine

## 2022-07-17 VITALS — BP 170/80 | HR 95 | Temp 97.9°F | Ht 65.0 in | Wt 160.0 lb

## 2022-07-17 DIAGNOSIS — Z0000 Encounter for general adult medical examination without abnormal findings: Secondary | ICD-10-CM | POA: Diagnosis not present

## 2022-07-17 DIAGNOSIS — M25561 Pain in right knee: Secondary | ICD-10-CM | POA: Diagnosis not present

## 2022-07-17 DIAGNOSIS — I119 Hypertensive heart disease without heart failure: Secondary | ICD-10-CM | POA: Diagnosis not present

## 2022-07-17 NOTE — Progress Notes (Signed)
Subjective:    Patient ID: Heather Hoover, female    DOB: [birth year], 76 y.o.   MRN: [id number]  HPI Patient is a very pleasant 76 year old Caucasian female here today for complete physical exam.  She has whitecoat syndrome.  Her blood pressure is very high today but she checks it frequently at home and is well controlled between 120 and 130/70-80.  She has is well-documented.  Her colonoscopy was performed in 2021 and is up-to-date.  Her mammogram was performed in December 2022 and is up-to-date.  She is already scheduled for next mammogram.  Her bone density test has been scheduled for later this year.  Her most recent lab work is shown below and is outstanding Lab on 07/13/2022  Component Date Value Ref Range Status   WBC 07/13/2022 3.7 (L)  3.8 - 10.8 Thousand/uL Final   RBC 07/13/2022 4.19  3.80 - 5.10 Million/uL Final   Hemoglobin 07/13/2022 13.3  11.7 - 15.5 g/dL Final   HCT 96/28/3662 39.9  35.0 - 45.0 % Final   MCV 07/13/2022 95.2  80.0 - 100.0 fL Final   MCH 07/13/2022 31.7  27.0 - 33.0 pg Final   MCHC 07/13/2022 33.3  32.0 - 36.0 g/dL Final   RDW 94/76/5465 11.8  11.0 - 15.0 % Final   Platelets 07/13/2022 147  140 - 400 Thousand/uL Final   MPV 07/13/2022 9.9  7.5 - 12.5 fL Final   Neutro Abs 07/13/2022 2,194  1,500 - 7,800 cells/uL Final   Lymphs Abs 07/13/2022 1,021  850 - 3,900 cells/uL Final   Absolute Monocytes 07/13/2022 374  200 - 950 cells/uL Final   Eosinophils Absolute 07/13/2022 81  15 - 500 cells/uL Final   Basophils Absolute 07/13/2022 30  0 - 200 cells/uL Final   Neutrophils Relative % 07/13/2022 59.3  % Final   Total Lymphocyte 07/13/2022 27.6  % Final   Monocytes Relative 07/13/2022 10.1  % Final   Eosinophils Relative 07/13/2022 2.2  % Final   Basophils Relative 07/13/2022 0.8  % Final   Cholesterol 07/13/2022 147  <200 mg/dL Final   HDL 03/54/6568 72  > OR = 50 mg/dL Final   Triglycerides 12/75/1700 89  <150 mg/dL Final   LDL Cholesterol (Calc)  07/13/2022 58  mg/dL (calc) Final   Comment: Reference range: <100 . Desirable range <100 mg/dL for primary prevention;   <70 mg/dL for patients with CHD or diabetic patients  with > or = 2 CHD risk factors. Marland Kitchen LDL-C is now calculated using the Martin-Hopkins  calculation, which is a validated novel method providing  better accuracy than the Friedewald equation in the  estimation of LDL-C.  Horald Pollen et al. Lenox Ahr. 1749;449(67): 2061-2068  (http://education.[facility].com/faq/FAQ164)    Total CHOL/HDL Ratio 07/13/2022 2.0  <5.9 (calc) Final   Non-HDL Cholesterol (Calc) 07/13/2022 75  <130 mg/dL (calc) Final   Comment: For patients with diabetes plus 1 major ASCVD risk  factor, treating to a non-HDL-C goal of <100 mg/dL  (LDL-C of <16 mg/dL) is considered a therapeutic  option.    Glucose, Bld 07/13/2022 100 (H)  65 - 99 mg/dL Final   Comment: .            Fasting reference interval . For someone without known diabetes, a glucose value between 100 and 125 mg/dL is consistent with prediabetes and should be confirmed with a follow-up test. .    BUN 07/13/2022 19  7 - 25 mg/dL Final   Creat 38/46/6599 0.56 (  L)  0.60 - 1.00 mg/dL Final   BUN/Creatinine Ratio 07/13/2022 34 (H)  6 - 22 (calc) Final   Sodium 07/13/2022 137  135 - 146 mmol/L Final   Potassium 07/13/2022 5.3  3.5 - 5.3 mmol/L Final   Chloride 07/13/2022 100  98 - 110 mmol/L Final   CO2 07/13/2022 30  20 - 32 mmol/L Final   Calcium 07/13/2022 9.5  8.6 - 10.4 mg/dL Final   Total Protein 63/78/5885 6.6  6.1 - 8.1 g/dL Final   Albumin 02/77/4128 4.2  3.6 - 5.1 g/dL Final   Globulin 78/67/6720 2.4  1.9 - 3.7 g/dL (calc) Final   AG Ratio 07/13/2022 1.8  1.0 - 2.5 (calc) Final   Total Bilirubin 07/13/2022 0.4  0.2 - 1.2 mg/dL Final   Alkaline phosphatase (APISO) 07/13/2022 61  37 - 153 U/L Final   AST 07/13/2022 17  10 - 35 U/L Final   ALT 07/13/2022 11  6 - 29 U/L Final   She does complain of some right knee pain  and is requesting a cortisone shot in her right knee. Immunization History  Administered Date(s) Administered   Fluad Quad(high Dose 65+) 08/14/2019, 10/02/2020   Influenza, High Dose Seasonal PF 09/17/2017, 10/03/2018   Influenza-Unspecified 09/30/2011, 10/22/2015, 09/17/2017, 09/20/2018, 08/14/2019, 09/20/2021   PFIZER Comirnaty(Gray Top)Covid-19 Tri-Sucrose Vaccine 03/22/2021   PFIZER(Purple Top)SARS-COV-2 Vaccination 01/27/2020, 02/20/2020, 10/21/2020   Pneumococcal Conjugate-13 05/21/2014   Pneumococcal Polysaccharide-23 04/06/2017   Td 06/06/2012   Tdap 06/06/2012, 02/11/2019   Zoster Recombinat (Shingrix) 07/28/2021   Zoster, Live 10/09/2013    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   echo   Osteopenia 12/2018   T score -1.6 FRAX 10% / 1.5%   Past Surgical History:  Procedure Laterality Date   ABDOMINAL HYSTERECTOMY  02/07/1981   RSO   APPENDECTOMY     BREAST SURGERY     CARDIAC CATHETERIZATION     when in 3rd grade and age 25   CARPAL TUNNEL RELEASE Left 02/06/2021   Procedure: LEFT CARPAL TUNNEL RELEASE;  Surgeon: Cindee Salt, MD;  Location: [facility];  Service: Orthopedics;  Laterality: Left;  IV REGIONAL FOREARM BLOCK 45 MINUTES   CARPAL TUNNEL RELEASE Right 06/03/2021   Procedure: RIGHT CARPAL TUNNEL RELEASE;  Surgeon: Cindee Salt, MD;  Location: [facility];  Service: Orthopedics;  Laterality: Right;  45 MIN   COSMETIC SURGERY     forehead plastic surgery     MOH's surgery   FRACTURE SURGERY     KNEE ARTHROSCOPY     right   Current Outpatient Medications on File Prior to Visit  Medication Sig Dispense Refill   acetaminophen (TYLENOL) 325 MG tablet Take 650 mg by mouth every 6 (six) hours as needed for mild pain or headache.     carvedilol (COREG)  6.25 MG tablet TAKE 1 TABLET(6.25 MG) BY MOUTH TWICE DAILY WITH A MEAL 180 tablet 3   Cholecalciferol (VITAMIN D-3 PO) Take 2,000 Units by mouth daily. Taking 2000 daily     EFUDEX 5 % cream      Famotidine (PEPCID PO) Take by mouth.     meloxicam (MOBIC) 15 MG tablet TAKE 1 TABLET(15 MG) BY MOUTH DAILY. STOP DICLOFENAC 30 tablet  2   polyvinyl alcohol (LIQUIFILM TEARS) 1.4 % ophthalmic solution Place 1 drop into both eyes as needed for dry eyes.     potassium chloride (KLOR-CON) 10 MEQ tablet TAKE 1 TABLET(10 MEQ) BY MOUTH DAILY 90 tablet 2   PREVIDENT 5000 DRY MOUTH 1.1 % GEL dental gel Place onto teeth.     rosuvastatin (CRESTOR) 20 MG tablet TAKE 1 TABLET(20 MG) BY MOUTH DAILY 90 tablet 3   valsartan (DIOVAN) 160 MG tablet Take 1 tablet (160 mg total) by mouth daily. 90 tablet 3   No current facility-administered medications on file prior to visit.   Allergies  Allergen Reactions   Augmentin [Amoxicillin-Pot Clavulanate] Diarrhea    Did it involve swelling of the face/tongue/throat, SOB, or low BP? yes Did it involve sudden or severe rash/hives, skin peeling, or any reaction on the inside of your mouth or nose? no Did you need to seek medical attention at a hospital or doctor's office? no When did it last happen?      2000 If all above answers are "NO", may proceed with cephalosporin use.    Niacin-Lovastatin Er     Flushing,itching,syncope   Kenalog [Triamcinolone] Palpitations   Social History   Socioeconomic History   Marital status: Married    Spouse name: Not on file   Number of children: Not on file   Years of education: Not on file   Highest education level: Not on file  Occupational History   Not on file  Tobacco Use   Smoking status: Never   Smokeless tobacco: Never  Vaping Use   Vaping Use: Never used  Substance and Sexual Activity   Alcohol use: Yes    Comment: Occas   Drug use: No   Sexual activity: Not Currently    Birth control/protection:  Post-menopausal    Comment: 1st intercourse 37 yo-1 partner  Other Topics Concern   Not on file  Social History Narrative   Not on file   Social Determinants of Health   Financial Resource Strain: Low Risk  (08/22/2021)   Overall Financial Resource Strain (CARDIA)    Difficulty of Paying Living Expenses: Not hard at all  Food Insecurity: No Food Insecurity (08/22/2021)   Hunger Vital Sign    Worried About Running Out of Food in the Last Year: Never true    Ran Out of Food in the Last Year: Never true  Transportation Needs: No Transportation Needs (08/22/2021)   PRAPARE - Administrator, Civil Service (Medical): No    Lack of Transportation (Non-Medical): No  Physical Activity: Inactive (08/22/2021)   Exercise Vital Sign    Days of Exercise per Week: 0 days    Minutes of Exercise per Session: 0 min  Stress: Not on file  Social Connections: Not on file  Intimate Partner Violence: Not on file   Family History  Problem Relation Age of Onset   Hypertension Mother    Breast cancer Mother 44       Breast    Cancer Mother        uterine thye think   Cancer Father 55       prostate metastized to bone   Hypertension Father    Arthritis Father    Hypertension Sister    Breast cancer Sister 36   Cancer Sister 79       uterine   Heart failure Sister    Muscular dystrophy Brother       Review of Systems  All other systems reviewed and are negative.      Objective:   Physical Exam Vitals reviewed.  Constitutional:      General: She is not in acute distress.    Appearance: She is well-developed. She is not diaphoretic.  HENT:     Head: Normocephalic and atraumatic.     Right Ear: External ear normal.     Left Ear: External ear normal.     Nose: Nose normal.     Mouth/Throat:     Pharynx: No oropharyngeal exudate.  Eyes:     General: No scleral icterus.       Right eye: No discharge.        Left eye: No discharge.     Conjunctiva/sclera: Conjunctivae normal.      Pupils: Pupils are equal, round, and reactive to light.  Neck:     Thyroid: No thyromegaly.     Vascular: No JVD.     Trachea: No tracheal deviation.  Cardiovascular:     Rate and Rhythm: Normal rate and regular rhythm.     Heart sounds: Murmur heard.     No friction rub. No gallop.  Pulmonary:     Effort: Pulmonary effort is normal. No respiratory distress.     Breath sounds: Normal breath sounds. No stridor. No wheezing or rales.  Chest:     Chest wall: No tenderness.  Abdominal:     General: Bowel sounds are normal. There is no distension.     Palpations: Abdomen is soft. There is no mass.     Tenderness: There is no abdominal tenderness. There is no guarding or rebound.  Musculoskeletal:        General: No tenderness. Normal range of motion.     Cervical back: Normal range of motion and neck supple.  Lymphadenopathy:     Cervical: No cervical adenopathy.  Skin:    General: Skin is warm.     Coloration: Skin is not pale.     Findings: No erythema or rash.  Neurological:     Mental Status: She is alert and oriented to person, place, and time.     Cranial Nerves: No cranial nerve deficit.     Motor: No abnormal muscle tone.     Coordination: Coordination normal.     Deep Tendon Reflexes: Reflexes are normal and symmetric.  Psychiatric:        Behavior: Behavior normal.        Thought Content: Thought content normal.        Judgment: Judgment normal.           Assessment & Plan:  General medical exam  Benign hypertensive heart disease without heart failure  Recurrent pain of right knee Home blood pressures are well controlled.  Lab work is outstanding.  Recommended a flu shot in the fall.  Discussed RSV vaccination.  Cancer screening is up-to-date.  Mammogram is already been scheduled.  Bone density test has already been scheduled.  Regular anticipatory guidance is provided.  Using sterile technique, I injected the right knee with 2 cc of lidocaine, 2 cc of  Marcaine, and 2 cc of 40 mg per mill Kenalog.

## 2022-07-22 ENCOUNTER — Other Ambulatory Visit: Payer: Self-pay | Admitting: Cardiovascular Disease

## 2022-07-22 ENCOUNTER — Ambulatory Visit: Payer: Medicare PPO | Attending: Obstetrics and Gynecology | Admitting: Physical Therapy

## 2022-07-22 ENCOUNTER — Encounter: Payer: Self-pay | Admitting: Physical Therapy

## 2022-07-22 DIAGNOSIS — M6281 Muscle weakness (generalized): Secondary | ICD-10-CM | POA: Diagnosis not present

## 2022-07-22 DIAGNOSIS — R3915 Urgency of urination: Secondary | ICD-10-CM | POA: Diagnosis not present

## 2022-07-22 DIAGNOSIS — M5459 Other low back pain: Secondary | ICD-10-CM | POA: Diagnosis not present

## 2022-07-22 NOTE — Therapy (Signed)
OUTPATIENT PHYSICAL THERAPY TREATMENT NOTE   Patient Name: Heather Hoover MRN: [id number] DOB:03-Sep-1946, 76 y.o., female Today's Date: 07/22/2022  PCP: Susy Frizzle, MD REFERRING PROVIDER: Jaquita Folds, MD  END OF SESSION:   PT End of Session - 07/22/22 1017     Visit Number 6    Date for PT Re-Evaluation 08/17/22    Authorization Type [employer]    Authorization Time Period 6/5-8/28    Authorization - Visit Number 6    Authorization - Number of Visits 12    PT Start Time 8676    PT Stop Time 1055    PT Time Calculation (min) 40 min    Activity Tolerance Patient tolerated treatment well    Behavior During Therapy [facility] for tasks assessed/performed             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   echo   Osteopenia 12/2018   T score -1.6 FRAX 10% / 1.5%   Past Surgical History:  Procedure Laterality Date   ABDOMINAL HYSTERECTOMY  02/07/1981   RSO   APPENDECTOMY     BREAST SURGERY     CARDIAC CATHETERIZATION     when in 3rd grade and age 59   CARPAL TUNNEL RELEASE Left 02/06/2021   Procedure: LEFT CARPAL TUNNEL RELEASE;  Surgeon: Daryll Brod, MD;  Location: [place];  Service: Orthopedics;  Laterality: Left;  IV REGIONAL FOREARM BLOCK 45 MINUTES   CARPAL TUNNEL RELEASE Right 06/03/2021   Procedure: RIGHT CARPAL TUNNEL RELEASE;  Surgeon: Daryll Brod, MD;  Location: [place];  Service: Orthopedics;  Laterality: Right;  80 MIN   COSMETIC SURGERY     forehead plastic surgery     MOH's surgery   FRACTURE SURGERY     KNEE ARTHROSCOPY     right   Patient Active Problem List   Diagnosis Date Noted   Abnormal weight loss 07/10/2022   Colon cancer screening 07/10/2022   Gastroesophageal reflux disease 07/10/2022   Spondylosis  without myelopathy or radiculopathy, cervical region 05/14/2020   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    Dyslipidemia    Arthritis    Pulmonic stenosis, 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   Hypercholesterolemia 08/17/2011   Benign hypertensive heart disease without heart failure 08/17/2011  REFERRING DIAG: N32.81 (ICD-10-CM) - OAB (overactive bladder)   THERAPY DIAG:  Muscle weakness (generalized)   Urinary urgency   Other low back pain   Rationale for Evaluation and Treatment Rehabilitation   PERTINENT HISTORY: Abdominal hysterectomy 02/07/1981, Cardiac Catherization,    PRECAUTIONS: None  No strainn SUBJECTIVE: I have not done my exercises due to my chest hurting so I have had some leakage.                PAIN:  Are you having pain? Yes NPRS scale: 3/10 Pain location:  left low back   Pain type: aching and dull Pain description: intermittent    Aggravating factors: getting up and move  Relieving factors: getting up and moving around   BOWEL MOVEMENT Pain with bowel movement: No Type of bowel movement:Type (Bristol Stool Scale) Type 3 and 4, Frequency daily, and Strain Yes Fully empty rectum: No Leakage: No Fiber supplement: Yes: stool softner  URINATION Pain with urination: No Fully empty bladder: Yes:   Stream:  weaker  and depends on how full her bladder is Urgency: Yes:   Frequency: sometimes every 2 hours and drinking caffeine will have to urinate Leakage: Urge to void, Walking to the bathroom, and can be a large leak to change her clothes Pads: Yes: just incase   PATIENT GOALS reduce urgency and leakage   OBJECTIVE: (objective measures completed at initial evaluation unless otherwise dated)   DIAGNOSTIC FINDINGS:  PVR 10 ml   PATIENT SURVEYS: PFIQ-7 UIQ-7 14   COGNITION:            Overall cognitive status: Within functional limits for tasks assessed                              LUMBAR SPECIAL TESTS:  Trendelenburg sign: Positive on the left        POSTURE:  Reduced lumbar lordosis   LUMBARAROM/PROM   A/PROM A/PROM  eval  Flexion Minimal movement in the lumbar and fascial tightness  Extension Decreased by 25%  Right lateral flexion Decreased by 25%  Left rotation Decreased by 25%   (Blank rows = not tested)   LOWER EXTREMITY ROM:   Passive ROM Right eval Left eval  Hip external rotation 50 65   (Blank rows = not tested)   LOWER EXTREMITY MMT:   MMT Right eval Left eval Right 07/08/2022 Left 07/08/2022  Hip flexion 4/5 4/5 4+/5 4+/5  Hip extension 4/5 4/5 4/5 4/5  Hip abduction 3/5 2/5 3+/5 3-/5  Hip adduction 4/5 4/5 4/5 4/5    PELVIC MMT:   MMT eval 06/17/2022  Vaginal 1/5 2/5 with almost a lift but better hug of therapist finger  (Blank rows = not tested)         PALPATION:   General  restrictions at the hysterectomy area; contracts the upper instead of lower abdominal;                  External Perineal Exam left ilium is posteriorly rotated, decreased movement of L1-L5, tightness in the lumbar paraspinals                             Internal Pelvic Floor dryness vaginally externally and internally   TONE: increased   PROLAPSE: none   TODAY'S TREATMENT  07/22/2022  Neuromuscular re-education: Core facilitation:standing marching keeping pelvis leveled needing to keep hand on wall 15x each leg with looking at mirror  and therapist giving tactile cues Walking sideways with red band around knees holding onto wall and keeping hips leveled Sit to stand with red band around knees 15x, VC to keep knees against the band, flexing at hip, contract the pelvic floor    07/08/2022 Neuromuscular re-education: Core retraining: Core facilitation:standing marching keeping pelvis leveled needing to keep hand on wall 10x each leg Walking sideways with red band around knees holding onto wall and keeping hips leveled Sit to stand with red band around knees 15x Sitting clamshell with red band  20x        06/24/2022 Neuromuscular re-education: Core facilitation:diaphragmatic breathing with contracting the lower abdomen and pelvic floor 10x Supine marching with abdominal and pelvic floor contraction and not letting the pelvis rock.  Bridge 10x with pelvic floor contraction Sitting trunk flexion extension working the abdominals with keeping spinal neutral 10x Sitting ball squeezes with  pelvic floor contraction and no gluteal contraction 15x Standing heel raises with pelvic floor contraction Standing hip abduction with keeping hips leveled, working the abdominals and hips 10x each and holding onto chair Standing hip extension with keeping hips leveled and holding onto chair 10x each side Exercises: Stretches/mobility:piriformis stretch in sitting holding 30 sec each Hamstring stretch in sitting holding for 30 seconds each leg Sitting cat cow 10x Happy baby stretch in sitting for 30 sec    06/17/2022 Manual: Internal pelvic floor techniques:No emotional/communication barriers or cognitive limitation. Patient is motivated to learn. Patient understands and agrees with treatment goals and plan. PT explains patient will be examined in standing, sitting, and lying down to see how their muscles and joints work. When they are ready, they will be asked to remove their underwear so PT can examine their perineum. The patient is also given the option of providing their own chaperone as one is not provided in our facility. The patient also has the right and is explained the right to defer or refuse any part of the evaluation or treatment including the internal exam. With the patient's consent, PT will use one gloved finger to gently assess the muscles of the pelvic floor, seeing how well it contracts and relaxes and if there is muscle symmetry. After, the patient will get dressed and PT and patient will discuss exam findings and plan of care. PT and patient discuss plan of care, schedule, attendance policy and HEP  activities.   Going through the vaginal canal to work on the sides of the introitus, along the perineal body to improve the mobility of the tissue.  Neuromuscular re-education: Pelvic floor contraction training:While the therapist finger in the vaginal canal to work on the contraction with a lift Exercises: Strengthening: nustep level 5 fr 5 minutes while assessing patient Self-care: Educated patient on urge to void and bladder irritants and how it affects her bladder         PATIENT EDUCATION: 06/24/2022 Education details: Access Code: [email or web address] Person educated: Patient Education method: Explanation, Demonstration, Tactile cues, Verbal cues, and Handouts Education comprehension: verbalized understanding, returned demonstration, verbal cues required, tactile cues required, and needs further education         HOME EXERCISE PROGRAM: 07/08/2022  Access Code: [email or web address] URL: [email or web address] Date: 07/08/2022 Prepared by: Earlie Counts   Exercises -- Standing Marching  - 1 x daily - 3 x weekly - 1 sets - 10 reps - Side Stepping with Resistance at Ankles  - 1 x daily - 3 x weekly - 4 sets - 10 reps - Sit to Stand with Resistance Around Legs  - 1 x daily - 3 x weekly - 1 sets - 10 reps - Seated Hip Abduction with Resistance  - 1 x daily - 3 x weekly - 2 sets - 10 reps ASSESSMENT:   CLINICAL IMPRESSION: Patient is a 76 y.o. female who was seen today for physical therapy  treatment for overactive bladder. Patient has had  leaking urine since last visit due to not exercising because of her injury to the chest. Patient report her urgency is 70% better due to not exercising. Patient will drop her right hip when standing on the left and needs verbal cues to correct. She has to hold onto the wall with standing exercises due to weakness in left hip and able to keep her hips leveled.  Patient is able to feel the contracting the pelvic floor with exercise and do not hold her breath.  Patient  will benefit from skilled therapy to improve pelvic floor strength and coordination to reduce her leakage and back pain.        OBJECTIVE IMPAIRMENTS decreased activity tolerance, decreased coordination, decreased endurance, decreased ROM, decreased strength, and increased fascial restrictions.    ACTIVITY LIMITATIONS continence, toileting, and locomotion level   PARTICIPATION LIMITATIONS: community activity   PERSONAL FACTORS Abdominal hysterectomy 02/07/1981, Cardiac Catherization,  are also affecting patient's functional outcome.    REHAB POTENTIAL: Excellent   CLINICAL DECISION MAKING: Stable/uncomplicated   EVALUATION COMPLEXITY: Low     GOALS: Goals reviewed with patient? Yes   SHORT TERM GOALS: Target date: 06/22/2022   Patient independent with initial HEP for diaphragmatic breathing and hip stretches.  Baseline: Goal status: Met 06/24/2022   2.  Patient understands what vaginal moisturizers are and how they reduce dryness.  Baseline:  Goal status: met 06/12/2022  3.  Patient able to contract her abdominals without bulging the lower abdomen and putting pressure on the pelvic floor.  Baseline:  Goal status: met 06/17/2022   4.  Patient understands ways to control the urge to void.  Baseline:  Goal status: Met 06/24/2022   5.  Patent understands what bladder irritants are and how they affect the bladder.  Baseline:  Goal status: Met 06/24/2022     LONG TERM GOALS: Target date: 08/17/2022    Patient independent with advanced HEP for core and pelvic floor strength to reduce urinary leakage and low back pain.  Baseline:  Goal status: ongoing 07/22/2022  2.  Patient is able to walk to the bathroom without leaking urine due to increased pelvic floor strength >/= 3/5 and not bulging her lower abdomen.  Baseline:  Goal status: ongoing 07/22/2022  3.  Patient is able to hear running water and placing her hands in the water with urgency decreased </= 75%.  Baseline:  Goal  status: ongoing 07/22/2022   4.  Patient reports her vaginal dryness is >/= 75% better due to using vaginal moisturizers to improve tissue health.  Baseline:  Goal status: Met 07/08/2022  5.  Patient reports her low back pain is </= 1-2/10 due to improve lumbar motion.  Baseline:  Goal status: ongoing 07/22/2022      PLAN: PT FREQUENCY: 1x/week   PT DURATION: 12 weeks   PLANNED INTERVENTIONS: Therapeutic exercises, Therapeutic activity, Neuromuscular re-education, Patient/Family education, Joint mobilization, Dry Needling, Electrical stimulation, Spinal mobilization, Moist heat, scar mobilization, Taping, Biofeedback, and Manual therapy   PLAN FOR NEXT SESSION:    continue with left gluteus medius, pelvic floor contraction   Earlie Counts, PT 07/22/22 10:54 AM

## 2022-07-25 ENCOUNTER — Other Ambulatory Visit: Payer: Self-pay | Admitting: Cardiovascular Disease

## 2022-07-27 NOTE — Telephone Encounter (Signed)
Rx(s) sent to pharmacy electronically.  

## 2022-07-29 ENCOUNTER — Encounter: Payer: Self-pay | Admitting: Physical Therapy

## 2022-07-29 ENCOUNTER — Ambulatory Visit: Payer: Medicare PPO | Admitting: Physical Therapy

## 2022-07-29 DIAGNOSIS — R3915 Urgency of urination: Secondary | ICD-10-CM

## 2022-07-29 DIAGNOSIS — M5459 Other low back pain: Secondary | ICD-10-CM | POA: Diagnosis not present

## 2022-07-29 DIAGNOSIS — M6281 Muscle weakness (generalized): Secondary | ICD-10-CM

## 2022-07-29 NOTE — Therapy (Signed)
OUTPATIENT PHYSICAL THERAPY TREATMENT NOTE   Patient Name: Heather Hoover MRN: [id number] DOB:15-Dec-1946, 76 y.o., female Today's Date: 07/29/2022  PCP: Susy Frizzle, MD REFERRING PROVIDER: Jaquita Folds, MD  END OF SESSION:   PT End of Session - 07/29/22 0930     Visit Number 7    Date for PT Re-Evaluation 08/17/22    Authorization Type [employer]    Authorization Time Period 6/5-8/28    Authorization - Visit Number 7    Authorization - Number of Visits 12    PT Start Time 0930    PT Stop Time 1010    PT Time Calculation (min) 40 min    Activity Tolerance Patient tolerated treatment well    Behavior During Therapy [facility] for tasks assessed/performed             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   echo   Osteopenia 12/2018   T score -1.6 FRAX 10% / 1.5%   Past Surgical History:  Procedure Laterality Date   ABDOMINAL HYSTERECTOMY  02/07/1981   RSO   APPENDECTOMY     BREAST SURGERY     CARDIAC CATHETERIZATION     when in 3rd grade and age 72   CARPAL TUNNEL RELEASE Left 02/06/2021   Procedure: LEFT CARPAL TUNNEL RELEASE;  Surgeon: Daryll Brod, MD;  Location: [place];  Service: Orthopedics;  Laterality: Left;  IV REGIONAL FOREARM BLOCK 45 MINUTES   CARPAL TUNNEL RELEASE Right 06/03/2021   Procedure: RIGHT CARPAL TUNNEL RELEASE;  Surgeon: Daryll Brod, MD;  Location: [place];  Service: Orthopedics;  Laterality: Right;  58 MIN   COSMETIC SURGERY     forehead plastic surgery     MOH's surgery   FRACTURE SURGERY     KNEE ARTHROSCOPY     right   Patient Active Problem List   Diagnosis Date Noted   Abnormal weight loss 07/10/2022   Colon cancer screening 07/10/2022   Gastroesophageal reflux disease 07/10/2022   Spondylosis  without myelopathy or radiculopathy, cervical region 05/14/2020   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    Dyslipidemia    Arthritis    Pulmonic stenosis, 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   Hypercholesterolemia 08/17/2011   Benign hypertensive heart disease without heart failure 08/17/2011   REFERRING DIAG: N32.81 (ICD-10-CM) - OAB (overactive bladder)   THERAPY DIAG:  Muscle weakness (generalized)   Urinary urgency   Other low back pain   Rationale for Evaluation and Treatment Rehabilitation   PERTINENT HISTORY: Abdominal hysterectomy 02/07/1981, Cardiac Catherization,    PRECAUTIONS: None  No strainn SUBJECTIVE: I feel like I am on course. My back gets tired. My chest feels better. No straining for a bowel movement.                PAIN:  Are you having pain? Yes NPRS scale: 2/10 Pain location:  left low back   Pain type: aching and dull Pain description: intermittent    Aggravating factors: getting up and move  Relieving factors: getting up and moving around   BOWEL MOVEMENT Pain with bowel movement: No Type of bowel movement:Type (Bristol Stool Scale) Type 3 and 4, Frequency daily, and Strain no Fully empty rectum: No Leakage: No  Fiber supplement: Yes: stool softner   URINATION Pain with urination: No Fully empty bladder: Yes:   Stream:  weaker  and depends on how full her bladder is Urgency: Yes when she drinks caffeine  Frequency: sometimes every 2 hours and drinking caffeine will have to urinate Leakage: Urge to void, Walking to the bathroom, and can be a large leak to change her clothes Pads: Yes: just incase   PATIENT GOALS reduce urgency and leakage   OBJECTIVE: (objective measures completed at initial evaluation unless otherwise dated)   DIAGNOSTIC FINDINGS:  PVR 10 ml   PATIENT SURVEYS: PFIQ-7 UIQ-7 14   COGNITION:            Overall cognitive status: Within functional limits for tasks assessed                              LUMBAR SPECIAL  TESTS:  Trendelenburg sign: Positive on the left       POSTURE:  Reduced lumbar lordosis   LUMBARAROM/PROM   A/PROM A/PROM  eval  Flexion Minimal movement in the lumbar and fascial tightness  Extension Decreased by 25%  Right lateral flexion Decreased by 25%  Left rotation Decreased by 25%   (Blank rows = not tested)   LOWER EXTREMITY ROM:   Passive ROM Right eval Left eval Right  07/29/2022 Left 07/29/2022  Hip external rotation 50 65 60 70   (Blank rows = not tested)   LOWER EXTREMITY MMT:   MMT Right eval Left eval Right 07/08/2022 Left 07/08/2022  Hip flexion 4/5 4/5 4+/5 4+/5  Hip extension 4/5 4/5 4/5 4/5  Hip abduction 3/5 2/5 3+/5 3-/5  Hip adduction 4/5 4/5 4/5 4/5    PELVIC MMT:   MMT eval 06/17/2022  Vaginal 1/5 2/5 with almost a lift but better hug of therapist finger  (Blank rows = not tested)         PALPATION:   General  restrictions at the hysterectomy area; contracts the upper instead of lower abdominal;                  External Perineal Exam left ilium is posteriorly rotated, decreased movement of L1-L5, tightness in the lumbar paraspinals                             Internal Pelvic Floor dryness vaginally externally and internally   TONE: increased   PROLAPSE: none   TODAY'S TREATMENT  07/29/2022 Neuromuscular re-education: Core retraining: Core facilitation: supine with red band around knees marching as she holds red band at shoulder height 10x each side Supine bridge with red band around knees and holding red band at shoulder height 15x and VC to tighten the gluteals Sidely with red band around knees clamshell with feet apart and pelvic floor engaged 10x each side.  Stand on one leg keeping pelvis leveled with hip flexion 15x each leg; in front of mirror; when standing on left leg needs tactile cues to keep hip leveled Sit to stand with red band around the knees working on control 15x Sitting clamshell with red band 20x    07/22/2022   Neuromuscular re-education: Core facilitation:standing marching keeping pelvis leveled needing to keep hand on wall 15x each leg with looking at mirror  and therapist giving tactile cues Walking sideways with red band around knees holding onto wall and keeping hips leveled Sit to  stand with red band around knees 15x, VC to keep knees against the band, flexing at hip, contract the pelvic floor    07/08/2022 Neuromuscular re-education: Core retraining: Core facilitation:standing marching keeping pelvis leveled needing to keep hand on wall 10x each leg Walking sideways with red band around knees holding onto wall and keeping hips leveled Sit to stand with red band around knees 15x Sitting clamshell with red band  20x       06/24/2022 Neuromuscular re-education: Core facilitation:diaphragmatic breathing with contracting the lower abdomen and pelvic floor 10x Supine marching with abdominal and pelvic floor contraction and not letting the pelvis rock.  Bridge 10x with pelvic floor contraction Sitting trunk flexion extension working the abdominals with keeping spinal neutral 10x Sitting ball squeezes with pelvic floor contraction and no gluteal contraction 15x Standing heel raises with pelvic floor contraction Standing hip abduction with keeping hips leveled, working the abdominals and hips 10x each and holding onto chair Standing hip extension with keeping hips leveled and holding onto chair 10x each side Exercises: Stretches/mobility:piriformis stretch in sitting holding 30 sec each Hamstring stretch in sitting holding for 30 seconds each leg Sitting cat cow 10x Happy baby stretch in sitting for 30 sec         PATIENT EDUCATION: 78/08/2022 Education details: Access Code: [email or web address] Person educated: Patient Education method: Consulting civil engineer, Media planner, Corporate treasurer cues, Verbal cues, and Handouts Education comprehension: verbalized understanding, returned demonstration, verbal cues required,  tactile cues required, and needs further education         HOME EXERCISE PROGRAM: 07/29/2022  Access Code: [email or web address] URL: [email or web address] Date: 07/29/2022 Prepared by: Earlie Counts  Exercises - Seated Pelvic Floor Contraction  - 3 x daily - 7 x weekly - 1 sets - 5 reps - 5 sec hold - Seated Piriformis Stretch with Trunk Bend  - 1 x daily - 3 x weekly - 1 sets - 2 reps - 30 sec hold - Seated Hamstring Stretch  - 1 x daily - 3 x weekly - 1 sets - 2 reps - 30 sec hold - Seated Cat Cow  - 1 x daily - 3 x weekly - 1 sets - 10 reps - Seated Happy Baby With Trunk Flexion For Pelvic Relaxation  - 1 x daily - 3 x weekly - 1 sets - 1 reps - 30 sec hold - Supine March  - 1 x daily - 3 x weekly - 1 sets - 10 reps - Standing Marching  - 1 x daily - 3 x weekly - 1 sets - 10 reps - Side Stepping with Resistance at Ankles  - 1 x daily - 3 x weekly - 4 sets - 10 reps - Sit to Stand with Resistance Around Legs  - 1 x daily - 3 x weekly - 1 sets - 10 reps - Seated Hip Abduction with Resistance  - 1 x daily - 3 x weekly - 2 sets - 10 reps - Supine Bridge with Resistance Band  - 1 x daily - 3 x weekly - 1 sets - 10 reps - Clamshell in Abduction  - 1 x daily - 3 x weekly - 2 sets - 10 reps  ASSESSMENT:   CLINICAL IMPRESSION: Patient is a 76 y.o. female who was seen today for physical therapy  treatment for overactive bladder. No straining with bowel movement. Urgency related to caffeine. Patient will leak urine on her way to sitting on the commode. It is a dribble instead of a large leak. Back  pain is around 2/10. Patient is able to hear running water and placing her hands in the water with urgency decreased by 75%. Patient has increased in hip ER. She continues to need tactile cues when standing on the left leg and moving the right due to gluteus weakness. Patient will benefit from skilled therapy to improve pelvic floor strength and coordination to reduce her leakage and back pain.         OBJECTIVE IMPAIRMENTS decreased activity tolerance, decreased coordination, decreased endurance, decreased ROM, decreased strength, and increased fascial restrictions.    ACTIVITY LIMITATIONS continence, toileting, and locomotion level   PARTICIPATION LIMITATIONS: community activity   PERSONAL FACTORS Abdominal hysterectomy 02/07/1981, Cardiac Catherization,  are also affecting patient's functional outcome.    REHAB POTENTIAL: Excellent   CLINICAL DECISION MAKING: Stable/uncomplicated   EVALUATION COMPLEXITY: Low     GOALS: Goals reviewed with patient? Yes   SHORT TERM GOALS: Target date: 06/22/2022   Patient independent with initial HEP for diaphragmatic breathing and hip stretches.  Baseline: Goal status: Met 06/24/2022   2.  Patient understands what vaginal moisturizers are and how they reduce dryness.  Baseline:  Goal status: met 06/12/2022  3.  Patient able to contract her abdominals without bulging the lower abdomen and putting pressure on the pelvic floor.  Baseline:  Goal status: met 06/17/2022   4.  Patient understands ways to control the urge to void.  Baseline:  Goal status: Met 06/24/2022   5.  Patent understands what bladder irritants are and how they affect the bladder.  Baseline:  Goal status: Met 06/24/2022     LONG TERM GOALS: Target date: 08/17/2022    Patient independent with advanced HEP for core and pelvic floor strength to reduce urinary leakage and low back pain.  Baseline:  Goal status: ongoing 07/22/2022  2.  Patient is able to walk to the bathroom without leaking urine due to increased pelvic floor strength >/= 3/5 and not bulging her lower abdomen.  Baseline:  Goal status: ongoing 07/22/2022  3.  Patient is able to hear running water and placing her hands in the water with urgency decreased </= 75%.  Baseline: 75% better.  Goal status: Met  4.  Patient reports her vaginal dryness is >/= 75% better due to using vaginal moisturizers to improve tissue  health.  Baseline:  Goal status: Met 07/08/2022  5.  Patient reports her low back pain is </= 1-2/10 due to improve lumbar motion.  Baseline:  Goal status: Met 07/29/2022     PLAN: PT FREQUENCY: 1x/week   PT DURATION: 12 weeks   PLANNED INTERVENTIONS: Therapeutic exercises, Therapeutic activity, Neuromuscular re-education, Patient/Family education, Joint mobilization, Dry Needling, Electrical stimulation, Spinal mobilization, Moist heat, scar mobilization, Taping, Biofeedback, and Manual therapy   PLAN FOR NEXT SESSION:    [place] in standing and diagonal in standing   Earlie Counts, PT 07/29/22 10:10 AM

## 2022-08-05 ENCOUNTER — Ambulatory Visit: Payer: Medicare PPO | Admitting: Physical Therapy

## 2022-08-05 ENCOUNTER — Encounter: Payer: Self-pay | Admitting: Physical Therapy

## 2022-08-05 DIAGNOSIS — M5459 Other low back pain: Secondary | ICD-10-CM | POA: Diagnosis not present

## 2022-08-05 DIAGNOSIS — M6281 Muscle weakness (generalized): Secondary | ICD-10-CM

## 2022-08-05 DIAGNOSIS — R3915 Urgency of urination: Secondary | ICD-10-CM | POA: Diagnosis not present

## 2022-08-05 NOTE — Patient Instructions (Signed)

## 2022-08-05 NOTE — Therapy (Signed)
OUTPATIENT PHYSICAL THERAPY TREATMENT NOTE   Patient Name: Heather Hoover MRN: [id number] DOB:[birth year], 76 y.o., female Today's Date: 08/05/2022  PCP: Susy Frizzle, MD REFERRING PROVIDER: Jaquita Folds, MD  END OF SESSION:   PT End of Session - 08/05/22 0930     Visit Number 8    Date for PT Re-Evaluation 08/17/22    Authorization Type [employer]    Authorization Time Period 6/5-8/28    Authorization - Visit Number 8    Authorization - Number of Visits 12    PT Start Time 0930    PT Stop Time 1010    PT Time Calculation (min) 40 min    Activity Tolerance Patient tolerated treatment well    Behavior During Therapy [facility] for tasks assessed/performed             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   echo   Osteopenia 12/2018   T score -1.6 FRAX 10% / 1.5%   Past Surgical History:  Procedure Laterality Date   ABDOMINAL HYSTERECTOMY  02/07/1981   RSO   APPENDECTOMY     BREAST SURGERY     CARDIAC CATHETERIZATION     when in 3rd grade and age 48   CARPAL TUNNEL RELEASE Left 02/06/2021   Procedure: LEFT CARPAL TUNNEL RELEASE;  Surgeon: Daryll Brod, MD;  Location: [place];  Service: Orthopedics;  Laterality: Left;  IV REGIONAL FOREARM BLOCK 45 MINUTES   CARPAL TUNNEL RELEASE Right 06/03/2021   Procedure: RIGHT CARPAL TUNNEL RELEASE;  Surgeon: Daryll Brod, MD;  Location: [place];  Service: Orthopedics;  Laterality: Right;  59 MIN   COSMETIC SURGERY     forehead plastic surgery     MOH's surgery   FRACTURE SURGERY     KNEE ARTHROSCOPY     right   Patient Active Problem List   Diagnosis Date Noted   Abnormal weight loss 07/10/2022   Colon cancer screening 07/10/2022   Gastroesophageal reflux disease 07/10/2022   Spondylosis  without myelopathy or radiculopathy, cervical region 05/14/2020   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    Dyslipidemia    Arthritis    Pulmonic stenosis, 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   Hypercholesterolemia 08/17/2011   Benign hypertensive heart disease without heart failure 08/17/2011  REFERRING DIAG: N32.81 (ICD-10-CM) - OAB (overactive bladder)   THERAPY DIAG:  Muscle weakness (generalized)   Urinary urgency   Other low back pain   Rationale for Evaluation and Treatment Rehabilitation   PERTINENT HISTORY: Abdominal hysterectomy 02/07/1981, Cardiac Catherization,    PRECAUTIONS: None  SUBJECTIVE: I was doing great until I feel again out of the blue. I tripped over my area rug. I hit my head.                PAIN:  Are you having pain? Yes NPRS scale: 2/10 Pain location:  left low back   Pain type: aching and dull Pain description: intermittent    Aggravating factors: getting up and move  Relieving factors: getting up and moving around   BOWEL MOVEMENT Pain with bowel movement: No Type of bowel movement:Type (Bristol Stool Scale) Type 3 and 4, Frequency daily, and Strain no Fully empty rectum: No Leakage: No Fiber supplement:  Yes: stool softner   URINATION Pain with urination: No Fully empty bladder: Yes:   Stream:  weaker  and depends on how full her bladder is Urgency: Yes when she drinks caffeine  Frequency: sometimes every 2 hours and drinking caffeine will have to urinate Leakage: Urge to void, Walking to the bathroom, and can be a large leak to change her clothes Pads: Yes: just incase   PATIENT GOALS reduce urgency and leakage   OBJECTIVE: (objective measures completed at initial evaluation unless otherwise dated)   DIAGNOSTIC FINDINGS:  PVR 10 ml   PATIENT SURVEYS: PFIQ-7 UIQ-7 14   COGNITION:            Overall cognitive status: Within functional limits for tasks assessed                              LUMBAR SPECIAL TESTS:  Trendelenburg  sign: Positive on the left       POSTURE:  Reduced lumbar lordosis   LUMBARAROM/PROM   A/PROM A/PROM  eval  Flexion Minimal movement in the lumbar and fascial tightness  Extension Decreased by 25%  Right lateral flexion Decreased by 25%  Left rotation Decreased by 25%   (Blank rows = not tested)   LOWER EXTREMITY ROM:   Passive ROM Right eval Left eval Right  07/29/2022 Left 07/29/2022  Hip external rotation 50 65 60 70   (Blank rows = not tested)   LOWER EXTREMITY MMT:   MMT Right eval Left eval Right 07/08/2022 Left 07/08/2022  Hip flexion 4/5 4/5 4+/5 4+/5  Hip extension 4/5 4/5 4/5 4/5  Hip abduction 3/5 2/5 3+/5 3-/5  Hip adduction 4/5 4/5 4/5 4/5    PELVIC MMT:   MMT eval 06/17/2022  Vaginal 1/5 2/5 with almost a lift but better hug of therapist finger  (Blank rows = not tested)         PALPATION:   General  restrictions at the hysterectomy area; contracts the upper instead of lower abdominal;                  External Perineal Exam left ilium is posteriorly rotated, decreased movement of L1-L5, tightness in the lumbar paraspinals                             Internal Pelvic Floor dryness vaginally externally and internally   TONE: increased   PROLAPSE: none   TODAY'S TREATMENT  08/05/2022  Exercises: Stretches/mobility: Strengthening: leg press 60# 30x with VC on pelvic floor contraction and technique Nustep level 5 for 5 minutes while assessing patient.  Lat bar 25# 15 times with pelvic floor contraction and VC on technique Row machine 10# with pelvic floor contraction 15x Sit on physioball bicep curl 2# Sit on physioball bil. Shoulder abduction 2# Sit on physioball tricep 2# Self-care: Educated patient on how to prevent falls and gave her a handout due to falling 2 times in the last month.     07/29/2022 Neuromuscular re-education: Core retraining: Core facilitation: supine with red band around knees marching as she holds red band at shoulder  height 10x each side Supine bridge with red band around knees and holding red band at shoulder height 15x and VC to tighten the gluteals Sidely with red band around knees clamshell with feet apart and pelvic floor engaged 10x each side.  Stand on one leg keeping pelvis  leveled with hip flexion 15x each leg; in front of mirror; when standing on left leg needs tactile cues to keep hip leveled Sit to stand with red band around the knees working on control 15x Sitting clamshell with red band 20x    07/22/2022  Neuromuscular re-education: Core facilitation:standing marching keeping pelvis leveled needing to keep hand on wall 15x each leg with looking at mirror  and therapist giving tactile cues Walking sideways with red band around knees holding onto wall and keeping hips leveled Sit to stand with red band around knees 15x, VC to keep knees against the band, flexing at hip, contract the pelvic floor             PATIENT EDUCATION: 08/05/2022 Education details: educated patient on gym equipment; educated patient on how to prevent falls.  Person educated: Patient Education method: Explanation, Demonstration, Tactile cues, Verbal cues, and Handouts Education comprehension: verbalized understanding, returned demonstration, verbal cues required, tactile cues required, and needs further education         HOME EXERCISE PROGRAM: 07/29/2022  Access Code: [email or web address] URL: [email or web address] Date: 07/29/2022 Prepared by: Earlie Counts   Exercises - Seated Pelvic Floor Contraction  - 3 x daily - 7 x weekly - 1 sets - 5 reps - 5 sec hold - Seated Piriformis Stretch with Trunk Bend  - 1 x daily - 3 x weekly - 1 sets - 2 reps - 30 sec hold - Seated Hamstring Stretch  - 1 x daily - 3 x weekly - 1 sets - 2 reps - 30 sec hold - Seated Cat Cow  - 1 x daily - 3 x weekly - 1 sets - 10 reps - Seated Happy Baby With Trunk Flexion For Pelvic Relaxation  - 1 x daily - 3 x weekly - 1 sets - 1 reps - 30  sec hold - Supine March  - 1 x daily - 3 x weekly - 1 sets - 10 reps - Standing Marching  - 1 x daily - 3 x weekly - 1 sets - 10 reps - Side Stepping with Resistance at Ankles  - 1 x daily - 3 x weekly - 4 sets - 10 reps - Sit to Stand with Resistance Around Legs  - 1 x daily - 3 x weekly - 1 sets - 10 reps - Seated Hip Abduction with Resistance  - 1 x daily - 3 x weekly - 2 sets - 10 reps - Supine Bridge with Resistance Band  - 1 x daily - 3 x weekly - 1 sets - 10 reps - Clamshell in Abduction  - 1 x daily - 3 x weekly - 2 sets - 10 reps   ASSESSMENT:   CLINICAL IMPRESSION: Patient is a 76 y.o. female who was seen today for physical therapy  treatment for overactive bladder.Patient fell again so therapist went over ways to prevent falls. She had urinary leakage last night due to drinking Gatorade.  Patient is learning on how to use the gym equipment without straining her pelvic floor. Patient will benefit from skilled therapy to improve pelvic floor strength and coordination to reduce her leakage and back pain.        OBJECTIVE IMPAIRMENTS decreased activity tolerance, decreased coordination, decreased endurance, decreased ROM, decreased strength, and increased fascial restrictions.    ACTIVITY LIMITATIONS continence, toileting, and locomotion level   PARTICIPATION LIMITATIONS: community activity   PERSONAL FACTORS Abdominal hysterectomy 02/07/1981, Cardiac Catherization,  are also affecting patient's functional outcome.  REHAB POTENTIAL: Excellent   CLINICAL DECISION MAKING: Stable/uncomplicated   EVALUATION COMPLEXITY: Low     GOALS: Goals reviewed with patient? Yes   SHORT TERM GOALS: Target date: 06/22/2022   Patient independent with initial HEP for diaphragmatic breathing and hip stretches.  Baseline: Goal status: Met 06/24/2022   2.  Patient understands what vaginal moisturizers are and how they reduce dryness.  Baseline:  Goal status: met 06/12/2022  3.  Patient able to  contract her abdominals without bulging the lower abdomen and putting pressure on the pelvic floor.  Baseline:  Goal status: met 06/17/2022   4.  Patient understands ways to control the urge to void.  Baseline:  Goal status: Met 06/24/2022   5.  Patent understands what bladder irritants are and how they affect the bladder.  Baseline:  Goal status: Met 06/24/2022     LONG TERM GOALS: Target date: 08/17/2022    Patient independent with advanced HEP for core and pelvic floor strength to reduce urinary leakage and low back pain.  Baseline:  Goal status: ongoing 07/22/2022  2.  Patient is able to walk to the bathroom without leaking urine due to increased pelvic floor strength >/= 3/5 and not bulging her lower abdomen.  Baseline:  Goal status: ongoing 07/22/2022  3.  Patient is able to hear running water and placing her hands in the water with urgency decreased </= 75%.  Baseline: 75% better.  Goal status: Met  4.  Patient reports her vaginal dryness is >/= 75% better due to using vaginal moisturizers to improve tissue health.  Baseline:  Goal status: Met 07/08/2022  5.  Patient reports her low back pain is </= 1-2/10 due to improve lumbar motion.  Baseline:  Goal status: Met 07/29/2022     PLAN: PT FREQUENCY: 1x/week   PT DURATION: 12 weeks   PLANNED INTERVENTIONS: Therapeutic exercises, Therapeutic activity, Neuromuscular re-education, Patient/Family education, Joint mobilization, Dry Needling, Electrical stimulation, Spinal mobilization, Moist heat, scar mobilization, Taping, Biofeedback, and Manual therapy   PLAN FOR NEXT SESSION:    finish going over gym equipment, discharge next visit.     Earlie Counts, PT 08/05/22 10:16 AM

## 2022-08-12 ENCOUNTER — Ambulatory Visit: Payer: Medicare PPO | Admitting: Physical Therapy

## 2022-08-12 ENCOUNTER — Encounter: Payer: Self-pay | Admitting: Physical Therapy

## 2022-08-12 DIAGNOSIS — R3915 Urgency of urination: Secondary | ICD-10-CM

## 2022-08-12 DIAGNOSIS — M6281 Muscle weakness (generalized): Secondary | ICD-10-CM

## 2022-08-12 DIAGNOSIS — M5459 Other low back pain: Secondary | ICD-10-CM | POA: Diagnosis not present

## 2022-08-12 NOTE — Therapy (Signed)
OUTPATIENT PHYSICAL THERAPY TREATMENT NOTE   Patient Name: Heather Hoover MRN: [id number] DOB:[birth year], 76 y.o., female Today's Date: 08/12/2022  PCP: Susy Frizzle, MD REFERRING PROVIDER: Jaquita Folds, MD  END OF SESSION:   PT End of Session - 08/12/22 0933     Visit Number 9    Date for PT Re-Evaluation 08/17/22    Authorization Type [employer]    Authorization Time Period 6/5-8/28    Authorization - Visit Number 9    Authorization - Number of Visits 12    PT Start Time 0930    PT Stop Time 1010    PT Time Calculation (min) 40 min    Activity Tolerance Patient tolerated treatment well    Behavior During Therapy [facility] for tasks assessed/performed             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   echo   Osteopenia 12/2018   T score -1.6 FRAX 10% / 1.5%   Past Surgical History:  Procedure Laterality Date   ABDOMINAL HYSTERECTOMY  02/07/1981   RSO   APPENDECTOMY     BREAST SURGERY     CARDIAC CATHETERIZATION     when in 3rd grade and age 50   CARPAL TUNNEL RELEASE Left 02/06/2021   Procedure: LEFT CARPAL TUNNEL RELEASE;  Surgeon: Daryll Brod, MD;  Location: [place];  Service: Orthopedics;  Laterality: Left;  IV REGIONAL FOREARM BLOCK 45 MINUTES   CARPAL TUNNEL RELEASE Right 06/03/2021   Procedure: RIGHT CARPAL TUNNEL RELEASE;  Surgeon: Daryll Brod, MD;  Location: [place];  Service: Orthopedics;  Laterality: Right;  40 MIN   COSMETIC SURGERY     forehead plastic surgery     MOH's surgery   FRACTURE SURGERY     KNEE ARTHROSCOPY     right   Patient Active Problem List   Diagnosis Date Noted   Abnormal weight loss 07/10/2022   Colon cancer screening 07/10/2022   Gastroesophageal reflux disease 07/10/2022   Spondylosis  without myelopathy or radiculopathy, cervical region 05/14/2020   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    Dyslipidemia    Arthritis    Pulmonic stenosis, 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                PAIN:  Are you having pain? no   BOWEL MOVEMENT Pain with bowel movement: No Type of bowel movement:Type (Bristol Stool Scale) Type 3 and 4, Frequency daily, and Strain no Fully empty rectum: No Leakage: No Fiber supplement: Yes: stool softner   URINATION Pain with urination: No Fully empty bladder: Yes:   Stream:  weaker  and depends on how full her bladder is Urgency: Yes when she drinks caffeine  Frequency: sometimes every 2 hours and drinking caffeine will have to urinate Leakage: Urge to  void, Walking to the bathroom, and can be a large leak to change her clothes Pads: Yes: just incase   PATIENT GOALS reduce urgency and leakage   OBJECTIVE: (objective measures completed at initial evaluation unless otherwise dated)   DIAGNOSTIC FINDINGS:  PVR 10 ml   PATIENT SURVEYS: PFIQ-7 UIQ-7 14   COGNITION:            Overall cognitive status: Within functional limits for tasks assessed                              LUMBAR SPECIAL TESTS:  Trendelenburg sign: Positive on the left       POSTURE:  Reduced lumbar lordosis   LUMBARAROM/PROM   A/PROM A/PROM  eval  Flexion Minimal movement in the lumbar and fascial tightness  Extension Decreased by 25%  Right lateral flexion Decreased by 25%  Left rotation Decreased  by 25%   (Blank rows = not tested)   LOWER EXTREMITY ROM:   Passive ROM Right eval Left eval Right  07/29/2022 Left 07/29/2022  Hip external rotation 50 65 60 70   (Blank rows = not tested)   LOWER EXTREMITY MMT:   MMT Right eval Left eval Right 07/08/2022 Left 07/08/2022 Right  08/12/2022 Left 08/12/2022  Hip flexion 4/5 4/5 4+/5 4+/5 4+/5 4+/5  Hip extension 4/5 4/5 4/5 4/5 4/5 4/5  Hip abduction 3/5 2/5 3+/5 3-/5 3+/5 3/5  Hip adduction 4/5 4/5 4/5 4/5 4/5 4/5    PELVIC MMT:   MMT eval 06/17/2022  Vaginal 1/5 2/5 with almost a lift but better hug of therapist finger  (Blank rows = not tested)         PALPATION:   General  restrictions at the hysterectomy area; contracts the upper instead of lower abdominal;                  External Perineal Exam left ilium is posteriorly rotated, decreased movement of L1-L5, tightness in the lumbar paraspinals                             Internal Pelvic Floor dryness vaginally externally and internally   TONE: increased   PROLAPSE: none   TODAY'S TREATMENT  08/12/2022 Exercises: Stretches/mobility: Strengthening:nustep level 4 for 5 minutes while assessing patient Row machine 10# with pelvic floor contraction 15x Hip abduction 40# 10x  bil.  leg press 60# 30x with VC on pelvic floor contraction and technique Bilateral knee extension 10# each leg with pelvic floor contraction Bilateral hamstring curl with green band 10x with pelvic floor contraction Chest press with green band with pelvic floor contraction 08/05/2022   Exercises: Stretches/mobility: Strengthening: leg press 60# 30x with VC on pelvic floor contraction and technique Nustep level 5 for 5 minutes while assessing patient.  Lat bar 25# 15 times with pelvic floor contraction and VC on technique Row machine 10# with pelvic floor contraction 15x Sit on physioball bicep curl 2# Sit on physioball bil. Shoulder abduction 2# Sit on physioball tricep  2# Self-care: Educated patient on how to prevent falls and gave her a handout due to falling 2 times in the last month.     07/29/2022 Neuromuscular re-education: Core retraining: Core facilitation: supine with red band around knees marching as she holds red band at shoulder height 10x each side Supine bridge with red band around knees and holding red band at  shoulder height 15x and VC to tighten the gluteals Sidely with red band around knees clamshell with feet apart and pelvic floor engaged 10x each side.  Stand on one leg keeping pelvis leveled with hip flexion 15x each leg; in front of mirror; when standing on left leg needs tactile cues to keep hip leveled Sit to stand with red band around the knees working on control 15x Sitting clamshell with red band 20x       PATIENT EDUCATION: 08/05/2022 Education details: educated patient on gym equipment; educated patient on how to prevent falls.  Person educated: Patient Education method: Explanation, Demonstration, Tactile cues, Verbal cues, and Handouts Education comprehension: verbalized understanding, returned demonstration, verbal cues required, tactile cues required, and needs further education         HOME EXERCISE PROGRAM: 07/29/2022  Access Code: [email or web address] URL: [email or web address] Date: 07/29/2022 Prepared by: Eulis Foster   Exercises - Seated Pelvic Floor Contraction  - 3 x daily - 7 x weekly - 1 sets - 5 reps - 5 sec hold - Seated Piriformis Stretch with Trunk Bend  - 1 x daily - 3 x weekly - 1 sets - 2 reps - 30 sec hold - Seated Hamstring Stretch  - 1 x daily - 3 x weekly - 1 sets - 2 reps - 30 sec hold - Seated Cat Cow  - 1 x daily - 3 x weekly - 1 sets - 10 reps - Seated Happy Baby With Trunk Flexion For Pelvic Relaxation  - 1 x daily - 3 x weekly - 1 sets - 1 reps - 30 sec hold - Supine March  - 1 x daily - 3 x weekly - 1 sets - 10 reps - Standing Marching  - 1 x daily - 3 x weekly - 1 sets - 10 reps -  Side Stepping with Resistance at Ankles  - 1 x daily - 3 x weekly - 4 sets - 10 reps - Sit to Stand with Resistance Around Legs  - 1 x daily - 3 x weekly - 1 sets - 10 reps - Seated Hip Abduction with Resistance  - 1 x daily - 3 x weekly - 2 sets - 10 reps - Supine Bridge with Resistance Band  - 1 x daily - 3 x weekly - 1 sets - 10 reps - Clamshell in Abduction  - 1 x daily - 3 x weekly - 2 sets - 10 reps   ASSESSMENT:   CLINICAL IMPRESSION: Patient is a 76 y.o. female who was seen today for physical therapy  treatment for overactive bladder.Patient fell again so therapist went over ways to prevent falls. Patient will only leak as she walks to the commode if she drinks a bladder irritant. Patient has increased strength. Patient is using the vaginal moisturizers. She is able to walk with decreased trunk sway due to increased core and hip strength. Her urinary urgency is 75% better. She is able to control her urgency with using the behavioral techniques.         OBJECTIVE IMPAIRMENTS decreased activity tolerance, decreased coordination, decreased endurance, decreased ROM, decreased strength, and increased fascial restrictions.    ACTIVITY LIMITATIONS continence, toileting, and locomotion level   PARTICIPATION LIMITATIONS: community activity   PERSONAL FACTORS Abdominal hysterectomy 02/07/1981, Cardiac Catherization,  are also affecting patient's functional outcome.    REHAB POTENTIAL: Excellent   CLINICAL DECISION MAKING: Stable/uncomplicated   EVALUATION COMPLEXITY: Low     GOALS: Goals reviewed with patient? Yes  SHORT TERM GOALS: Target date: 06/22/2022   Patient independent with initial HEP for diaphragmatic breathing and hip stretches.  Baseline: Goal status: Met 06/24/2022   2.  Patient understands what vaginal moisturizers are and how they reduce dryness.  Baseline:  Goal status: met 06/12/2022  3.  Patient able to contract her abdominals without bulging the lower abdomen and  putting pressure on the pelvic floor.  Baseline:  Goal status: met 06/17/2022   4.  Patient understands ways to control the urge to void.  Baseline:  Goal status: Met 06/24/2022   5.  Patent understands what bladder irritants are and how they affect the bladder.  Baseline:  Goal status: Met 06/24/2022     LONG TERM GOALS: Target date: 08/17/2022    Patient independent with advanced HEP for core and pelvic floor strength to reduce urinary leakage and low back pain.  Baseline:  Goal status: met 08/12/2022 2.  Patient is able to walk to the bathroom without leaking urine due to increased pelvic floor strength >/= 3/5 and not bulging her lower abdomen.  Baseline:  Goal status: met 08/12/2022 3.  Patient is able to hear running water and placing her hands in the water with urgency decreased </= 75%.  Baseline: 75% better.  Goal status: Met 08/12/2022 4.  Patient reports her vaginal dryness is >/= 75% better due to using vaginal moisturizers to improve tissue health.  Baseline:  Goal status: Met 07/08/2022  5.  Patient reports her low back pain is </= 1-2/10 due to improve lumbar motion.  Baseline:  Goal status: Met 07/29/2022     PLAN: PT FREQUENCY: 1x/week   PT DURATION: 12 weeks   PLANNED INTERVENTIONS: Therapeutic exercises, Therapeutic activity, Neuromuscular re-education, Patient/Family education, Joint mobilization, Dry Needling, Electrical stimulation, Spinal mobilization, Moist heat, scar mobilization, Taping, Biofeedback, and Manual therapy   PLAN FOR NEXT SESSION:    Discharge to HEP    Earlie Counts, PT 08/12/22 10:09 AM  PHYSICAL THERAPY DISCHARGE SUMMARY  Visits from Start of Care: 9  Current functional level related to goals / functional outcomes: See above.    Remaining deficits: See above   Education / Equipment: HEP   Patient agrees to discharge. Patient goals were met. Patient is being discharged due to meeting the stated rehab goals. Thank you for your  referral. Earlie Counts, PT 08/12/22 10:09 AM

## 2022-08-21 ENCOUNTER — Other Ambulatory Visit: Payer: Self-pay | Admitting: Family Medicine

## 2022-08-21 ENCOUNTER — Ambulatory Visit (HOSPITAL_COMMUNITY): Payer: Medicare PPO | Attending: Cardiovascular Disease

## 2022-08-21 DIAGNOSIS — Q221 Congenital pulmonary valve stenosis: Secondary | ICD-10-CM | POA: Diagnosis not present

## 2022-08-21 LAB — ECHOCARDIOGRAM COMPLETE
Area-P 1/2: 4.21 cm2
P 1/2 time: 604 msec
S' Lateral: 2.7 cm

## 2022-10-10 ENCOUNTER — Other Ambulatory Visit: Payer: Self-pay | Admitting: Cardiovascular Disease

## 2022-10-12 ENCOUNTER — Other Ambulatory Visit: Payer: Self-pay | Admitting: Cardiovascular Disease

## 2022-10-12 NOTE — Telephone Encounter (Signed)
Rx(s) sent to pharmacy electronically.  

## 2022-10-12 NOTE — Telephone Encounter (Signed)
Rx request sent to pharmacy.  

## 2022-10-13 ENCOUNTER — Encounter (HOSPITAL_BASED_OUTPATIENT_CLINIC_OR_DEPARTMENT_OTHER): Payer: Self-pay | Admitting: Cardiovascular Disease

## 2022-10-13 ENCOUNTER — Ambulatory Visit (HOSPITAL_BASED_OUTPATIENT_CLINIC_OR_DEPARTMENT_OTHER): Payer: Medicare PPO | Admitting: Cardiovascular Disease

## 2022-10-13 DIAGNOSIS — E7800 Pure hypercholesterolemia, unspecified: Secondary | ICD-10-CM | POA: Diagnosis not present

## 2022-10-13 DIAGNOSIS — Q221 Congenital pulmonary valve stenosis: Secondary | ICD-10-CM | POA: Diagnosis not present

## 2022-10-13 DIAGNOSIS — I119 Hypertensive heart disease without heart failure: Secondary | ICD-10-CM | POA: Diagnosis not present

## 2022-10-13 DIAGNOSIS — R0609 Other forms of dyspnea: Secondary | ICD-10-CM | POA: Diagnosis not present

## 2022-10-13 HISTORY — DX: Other forms of dyspnea: R06.09

## 2022-10-13 MED ORDER — METOPROLOL TARTRATE 100 MG PO TABS: ORAL_TABLET | ORAL | 0 refills | Status: DC

## 2022-10-13 NOTE — Patient Instructions (Addendum)
Medication Instructions:  MORNING OF CT DO NOT TAKE YOUR CARVEDILOL AND TAKE METOPROLOL 100 MG 2 HOURS PRIOR TO CT  *If you need a refill on your cardiac medications before your next appointment, please call your pharmacy*  Lab Work: BMET 1 WEEK PRIOR TO CT  If you have labs (blood work) drawn today and your tests are completely normal, you will receive your results only by: [place] (if you have MyChart) OR A paper copy in the mail If you have any lab test that is abnormal or we need to change your treatment, we will call you to review the results.  Testing/Procedures: Your physician has requested that you have cardiac CT. Cardiac computed tomography (CT) is a painless test that uses an x-ray machine to take clear, detailed pictures of your heart. For further information please visit [email or web address]. Please follow instruction sheet as given.  Your physician has requested that you have an echocardiogram. Echocardiography is a painless test that uses sound waves to create images of your heart. It provides your doctor with information about the size and shape of your heart and how well your heart's chambers and valves are working. This procedure takes approximately one hour. There are no restrictions for this procedure. Please do NOT wear cologne, perfume, aftershave, or lotions (deodorant is allowed). Please arrive 15 minutes prior to your appointment time. 2 YEARS   Follow-Up: At [facility], you and your health needs are our priority.  As part of our continuing mission to provide you with exceptional heart care, we have created designated Provider Care Teams.  These Care Teams include your primary Cardiologist (physician) and Advanced Practice Providers (APPs -  Physician Assistants and Nurse Practitioners) who all work together to provide you with the care you need, when you need it.  We recommend signing up for the patient portal called "MyChart".  Sign up information  is provided on this After Visit Summary.  MyChart is used to connect with patients for Virtual Visits (Telemedicine).  Patients are able to view lab/test results, encounter notes, upcoming appointments, etc.  Non-urgent messages can be sent to your provider as well.   To learn more about what you can do with MyChart, go to [email or web address].    Your next appointment:   3 month(s)  The format for your next appointment:   In Person  Provider:   Laurann Montana, NP   1 YEAR WITH DR [facility]    Other Instructions  WORK ON EXERCISE AND DIET     Your cardiac CT will be scheduled at one of the below locations:   [facility] [address] [place], [place] [address] [phone]  [facility] [address] [place], [place] [address] [phone]  [facility] [place],  [address] [phone]  If scheduled at [facility], please arrive at the [facility] and Children's Entrance (Entrance C2) of [facility] 30 minutes prior to test start time. You can use the FREE valet parking offered at entrance C (encouraged to control the heart rate for the test)  Proceed to the [facility] Radiology Department (first floor) to check-in and test prep.  All radiology patients and guests should use entrance C2 at [facility], accessed from [facility], even though the hospital's physical address listed is [address]    If scheduled at [facility] or [facility], please arrive 15 mins early for check-in and test prep.   Please follow these instructions carefully (unless otherwise directed):  Hold all erectile dysfunction medications at least 3 days (72 hrs) prior to test. (Ie viagra, cialis, sildenafil, tadalafil, etc) We will administer nitroglycerin during this exam.   On the  Night Before the Test: Be sure to Drink plenty of water. Do not consume any caffeinated/decaffeinated beverages or chocolate 12 hours prior to your test. Do not take any antihistamines 12 hours prior to your test.  On the Day of the Test: Drink plenty of water until 1 hour prior to the test. Do not eat any food 1 hour prior to test. You may take your regular medications prior to the test.  Take metoprolol (Lopressor) two hours prior to test. HOLD Furosemide/Hydrochlorothiazide morning of the test. FEMALES- please wear underwire-free bra if available, avoid dresses & tight clothing  After the Test: Drink plenty of water. After receiving IV contrast, you may experience a mild flushed feeling. This is normal. On occasion, you may experience a mild rash up to 24 hours after the test. This is not dangerous. If this occurs, you can take Benadryl 25 mg and increase your fluid intake. If you experience trouble breathing, this can be serious. If it is severe call 911 IMMEDIATELY. If it is mild, please call our office. If you take any of these medications: Glipizide/Metformin, Avandament, Glucavance, please do not take 48 hours after completing test unless otherwise instructed.  We will call to schedule your test 2-4 weeks out understanding that some insurance companies will need an authorization prior to the service being performed.   For non-scheduling related questions, please contact the cardiac imaging nurse navigator should you have any questions/concerns: Rockwell [place], Cardiac Imaging Nurse Navigator Larey Brick, Cardiac Imaging Nurse Navigator [facility] Direct Office Dial: [phone]   For scheduling needs, including cancellations and rescheduling, please call [place], [phone].  Cardiac CT Angiogram A cardiac CT angiogram is a procedure to look at the heart and the area around the heart. It may be done to help find the cause of chest pains or other  symptoms of heart disease. During this procedure, a substance called contrast dye is injected into the blood vessels in the area to be checked. A large X-ray machine, called a CT scanner, then takes detailed pictures of the heart and the surrounding area. The procedure is also sometimes called a coronary CT angiogram, coronary artery scanning, or CTA. A cardiac CT angiogram allows the health care provider to see how well blood is flowing to and from the heart. The health care provider will be able to see if there are any problems, such as: Blockage or narrowing of the coronary arteries in the heart. Fluid around the heart. Signs of weakness or disease in the muscles, valves, and tissues of the heart. Tell a health care provider about: Any allergies you have. This is especially important if you have had a previous allergic reaction to contrast dye. All medicines you are taking, including vitamins, herbs, eye drops, creams, and over-the-counter medicines. Any blood disorders you have. Any surgeries you have had. Any medical conditions you have. Whether you are pregnant or may be pregnant. Any anxiety disorders, chronic pain, or other conditions you have that may increase your stress or prevent you from lying still. What are the risks? Generally, this is a safe procedure. However, problems may occur, including: Bleeding. Infection. Allergic reactions to  medicines or dyes. Damage to other structures or organs. Kidney damage from the contrast dye that is used. Increased risk of cancer from radiation exposure. This risk is low. Talk with your health care provider about: The risks and benefits of testing. How you can receive the lowest dose of radiation. What happens before the procedure? Wear comfortable clothing and remove any jewelry, glasses, dentures, and hearing aids. Follow instructions from your health care provider about eating and drinking. This may include: For 12 hours before the  procedure -- avoid caffeine. This includes tea, coffee, soda, energy drinks, and diet pills. Drink plenty of water or other fluids that do not have caffeine in them. Being well hydrated can prevent complications. For 4-6 hours before the procedure -- stop eating and drinking. The contrast dye can cause nausea, but this is less likely if your stomach is empty. Ask your health care provider about changing or stopping your regular medicines. This is especially important if you are taking diabetes medicines, blood thinners, or medicines to treat problems with erections (erectile dysfunction). What happens during the procedure?  Hair on your chest may need to be removed so that small sticky patches called electrodes can be placed on your chest. These will transmit information that helps to monitor your heart during the procedure. An IV will be inserted into one of your veins. You might be given a medicine to control your heart rate during the procedure. This will help to ensure that good images are obtained. You will be asked to lie on an exam table. This table will slide in and out of the CT machine during the procedure. Contrast dye will be injected into the IV. You might feel warm, or you may get a metallic taste in your mouth. You will be given a medicine called nitroglycerin. This will relax or dilate the arteries in your heart. The table that you are lying on will move into the CT machine tunnel for the scan. The person running the machine will give you instructions while the scans are being done. You may be asked to: Keep your arms above your head. Hold your breath. Stay very still, even if the table is moving. When the scanning is complete, you will be moved out of the machine. The IV will be removed. The procedure may vary among health care providers and hospitals. What can I expect after the procedure? After your procedure, it is common to have: A metallic taste in your mouth from the  contrast dye. A feeling of warmth. A headache from the nitroglycerin. Follow these instructions at home: Take over-the-counter and prescription medicines only as told by your health care provider. If you are told, drink enough fluid to keep your urine pale yellow. This will help to flush the contrast dye out of your body. Most people can return to their normal activities right after the procedure. Ask your health care provider what activities are safe for you. It is up to you to get the results of your procedure. Ask your health care provider, or the department that is doing the procedure, when your results will be ready. Keep all follow-up visits as told by your health care provider. This is important. Contact a health care provider if: You have any symptoms of allergy to the contrast dye. These include: Shortness of breath. Rash or hives. A racing heartbeat. Summary A cardiac CT angiogram is a procedure to look at the heart and the area around the heart. It may be done  to help find the cause of chest pains or other symptoms of heart disease. During this procedure, a large X-ray machine, called a CT scanner, takes detailed pictures of the heart and the surrounding area after a contrast dye has been injected into blood vessels in the area. Ask your health care provider about changing or stopping your regular medicines before the procedure. This is especially important if you are taking diabetes medicines, blood thinners, or medicines to treat erectile dysfunction. If you are told, drink enough fluid to keep your urine pale yellow. This will help to flush the contrast dye out of your body. This information is not intended to replace advice given to you by your health care provider. Make sure you discuss any questions you have with your health care provider. Document Revised: 03/26/2022 Document Reviewed: 08/02/2019 Elsevier Patient Education  2023 [employer].

## 2022-10-13 NOTE — Assessment & Plan Note (Signed)
Lipids are very well controlled.  Encouraged to work on her exercise routine and continue rosuvastatin.

## 2022-10-13 NOTE — Progress Notes (Signed)
Cardiology Office Note   Date:  10/13/2022   ID:  Heather, Hoover 10-07-1946, MRN [id number]  PCP:  Donita Brooks, MD  Cardiologist:   Chilton Si, MD   No chief complaint on file.     History of Present Illness: Heather Hoover is a 76 y.o. female with a history of hypertension, hyperlipidemia, and congenital pulmonic stenosis who presents for follow up.  Heather Hoover was previously a patient of Dr. [name].  She had heart catheterizations at ages 35 and 65.  Each time she reportedly had mild pulmonic stenosis.  She saw [name] 65/2017 and reported chest heaviness after the death of her sister.  She also reported increased shortness of breath.  She had an echo 05/19/16 revealed LVEF 55-60% with mild mitral regurgitation, mild aortic regurgitation and mild pulmonary stenosis. The peak gradient is 20 mmHg.  HCTZ was reduced to 12.5mg  due to low BP.  This was in the setting of losing 50lb. She had a repeat echocardiogram 02/2019 that revealed LVEF 60 to 65% with grade 2 diastolic function.  Pulmonary pressure was mildly elevated at 9.8 mmHg.  Her BP was running low so carvedilol was reduced. Hydrochlorothiazide was also reduced due to dry mouth and eyes.  She was tested for Sjogren's and it was negative. 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 Her BP was poorly controlled in the office but she thought it had been controlled at home. She was referred to the prep program at the [facility] and she followed up with our pharmacist where Valsartan was increased with improvement in her blood pressures.  She provided her home BP log today which showed pressures in the 100s-140s with 1 measurement of 169 systolic but averaging 120s-130s. She has had recent issues with shortness of breath. She notes significant anxiety due to family stressors and feels that her dyspnea may be associated with this. Typically her shortness of breath is most bothersome in the  mornings but can occur randomly throughout the day. She states that she has reduced her exercise regimen somewhat due to knee pain, and suspects that her dyspnea may also be due to deconditioning. However, her episodes of shortness of breath are not typically triggered by exertional activities. She reports weight gain due to her change in exercise and eating more to cope with her stress. She denies any leg swelling apart form her right knee swelling. She states that she has been taking the Valsartan at bedtime without improvement of symptoms. She is also worried that wearing Lidocaine patches for her knee pain is contributing to her anxiety. She reports that when she is busy and distracted she does not notice any shortness of breath. She is not followed by anyone for her anxiety.  She notes that she was seen in the ED on 07/09/22 for persistent chest pain a few days after a trip and fall wherein she hit her chest on a couch. She fell again after tripping on a rug a few days later, hitting her face. She attributes her imbalance to her severe right knee OA. She is followed by orthopedic surgeon Dr. Cindee Salt and is considering TKA at some point.  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 10/28/2010  echo   Osteopenia 12/2018   T score -1.6 FRAX 10% / 1.5%    Past Surgical History:  Procedure Laterality Date   ABDOMINAL HYSTERECTOMY  02/07/1981   RSO   APPENDECTOMY     BREAST SURGERY     CARDIAC CATHETERIZATION     when in 3rd grade and age 64   CARPAL TUNNEL RELEASE Left 02/06/2021   Procedure: LEFT CARPAL TUNNEL RELEASE;  Surgeon: Cindee Salt, MD;  Location: [facility];  Service: Orthopedics;  Laterality: Left;  IV REGIONAL FOREARM BLOCK 45 MINUTES   CARPAL TUNNEL RELEASE Right 06/03/2021    Procedure: RIGHT CARPAL TUNNEL RELEASE;  Surgeon: Cindee Salt, MD;  Location: [facility];  Service: Orthopedics;  Laterality: Right;  45 MIN   COSMETIC SURGERY     forehead plastic surgery     MOH's surgery   FRACTURE SURGERY     KNEE ARTHROSCOPY     right     Current Outpatient Medications  Medication Sig Dispense Refill   acetaminophen (TYLENOL) 325 MG tablet Take 650 mg by mouth every 6 (six) hours as needed for mild pain or headache.     carvedilol (COREG) 6.25 MG tablet TAKE 1 TABLET(6.25 MG) BY MOUTH TWICE DAILY WITH A MEAL 180 tablet 0   Cholecalciferol (VITAMIN D-3 PO) Take 2,000 Units by mouth daily. Taking 2000 daily     Famotidine (PEPCID PO) Take by mouth.     meloxicam (MOBIC) 15 MG tablet TAKE 1 TABLET(15 MG) BY MOUTH DAILY. STOP DICLOFENAC 30 tablet 2   metoprolol tartrate (LOPRESSOR) 100 MG tablet TAKE 1 TABLET 2 HOURS PRIOR TO CT 1 tablet 0   polyvinyl alcohol (LIQUIFILM TEARS) 1.4 % ophthalmic solution Place 1 drop into both eyes as needed for dry eyes.     potassium chloride (KLOR-CON) 10 MEQ tablet TAKE 1 TABLET(10 MEQ) BY MOUTH DAILY 90 tablet 2   PREVIDENT 5000 DRY MOUTH 1.1 % GEL dental gel Place onto teeth.     rosuvastatin (CRESTOR) 20 MG tablet TAKE 1 TABLET(20 MG) BY MOUTH DAILY 90 tablet 0   valsartan (DIOVAN) 160 MG tablet Take 1 tablet (160 mg total) by mouth daily. 90 tablet 3   No current facility-administered medications for this visit.    Allergies:   Augmentin [amoxicillin-pot clavulanate], Niacin-lovastatin er, and Kenalog [triamcinolone]    Social History:  The patient  reports that she has never smoked. She has never used smokeless tobacco. She reports current alcohol use. She reports that she does not use drugs.   Family History:  The patient's family history includes Arthritis in her father; Breast cancer (age of onset: 57) in her sister; Breast cancer (age of onset: 19) in her mother; Cancer in her mother; Cancer (age of onset:  70) in her sister; Cancer (age of onset: 86) in her father; Heart failure in her sister; Hypertension in her father, mother, and sister; Muscular dystrophy in her brother.    ROS:   Please see the history of present illness. (+) Right knee pain and swelling (+) Shortness of breath All other systems are reviewed and negative.    PHYSICAL EXAM: VS:  BP (!) 160/82 (BP Location: Right Arm, Patient Position: Sitting, Cuff Size: Large)   Pulse 84   Ht 5\' 5"  (1.651 m)   Wt 160 lb 12.8 oz (72.9 kg)   BMI 26.76 kg/m  , BMI Body mass index is 26.76 kg/m. GENERAL:  Well appearing HEENT: Pupils equal round and  reactive, fundi not visualized, oral mucosa unremarkable NECK:  No jugular venous distention, waveform within normal limits, carotid upstroke brisk and symmetric, no bruits, LUNGS:  Clear to auscultation bilaterally HEART:  RRR.  PMI not displaced or sustained,S1 and S2 within normal limits, no S3, no S4, no clicks, no rubs, II/VI systolic murmur at the RUSB ABD:  Flat, positive bowel sounds normal in frequency in pitch, no bruits, no rebound, no guarding, no midline pulsatile mass, no hepatomegaly, no splenomegaly EXT:  2 plus pulses throughout, no edema, no cyanosis no clubbing SKIN:  No rashes no nodules NEURO:  Cranial nerves II through XII grossly intact, motor grossly intact throughout PSYCH:  Cognitively intact, oriented to person place and time   EKG:  EKG is ordered today. EKG done today reviewed and demonstrates sinus rhythm rate of 84bpm with PVCs. 08/22/2021: Sinus rhythm. Rate 63 bpm. 03/12/2020: Sinus rhythm.  Rate 68 bpm. 02/20/19: Sinus bradycardia.  Rate 59 bpm.  02/18/18: Sinus bradycardia.  Rate 57 bpm. 10/26/16: Sinus rhythm. Rate 77 bpm.  Echo 08/2022: 1. Left ventricular ejection fraction, by estimation, is 60 to 65%. The  left ventricle has normal function. The left ventricle has no regional  wall motion abnormalities. Left ventricular diastolic parameters were   normal.   2. Right ventricular systolic function is normal. The right ventricular  size is normal.   3. Mild mitral valve regurgitation.   4. The aortic valve is tricuspid. Aortic valve regurgitation is mild.  Aortic valve sclerosis is present, with no evidence of aortic valve  stenosis.   5. PV is thickened, difficult to see. Peak and mean gradients through the  valve are 15 and 8 mm Hg respectively. No significant change from echo  report in 2020.   Echo 02/2019:  1. The left ventricle has normal systolic function with an ejection  fraction of 60-65%. The cavity size was normal. Left ventricular diastolic  Doppler parameters are consistent with pseudonormalization. Indeterminate  filling pressures.   2. The right ventricle has normal systolic function. The cavity was  normal. There is no increase in right ventricular wall thickness.   3. Left atrial size was mildly dilated.   4. Mild pulmonic stenosis.    Recent Labs: 07/13/2022: ALT 11; BUN 19; Creat 0.56; Hemoglobin 13.3; Platelets 147; Potassium 5.3; Sodium 137    Lipid Panel    Component Value Date/Time   CHOL 147 07/13/2022 0842   CHOL 142 02/22/2019 0829   TRIG 89 07/13/2022 0842   HDL 72 07/13/2022 0842   HDL 67 02/22/2019 0829   CHOLHDL 2.0 07/13/2022 0842   VLDL 22 04/13/2017 0820   LDLCALC 58 07/13/2022 0842      Wt Readings from Last 3 Encounters:  10/13/22 160 lb 12.8 oz (72.9 kg)  07/17/22 160 lb (72.6 kg)  05/11/22 161 lb 6.4 oz (73.2 kg)     ASSESSMENT AND PLAN: Benign hypertensive heart disease without heart failure BP elevated in the office.  At home it has been averaging in the 120-130s/70-80s.  She hasn't been as active and has been eating more.  She has been under a lot of stress.  Continue carvedilol and valsartan and work on lifestyle.  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 Mean gradient 8 mmHg on echo.  Repeat echo in 2 years  Exertional dyspnea She has been more short of breath  lately.  She notes that she hasn't been as active and has gained weight.  She also has non-exertional dyspnea and  wonders if this is due to anxiety.  Recent echo was stable.  We will get a coronary CT-A to assess for obstructive CAD.  Continue rosuvastatin and carvedilol.  Hypercholesterolemia Lipids are very well controlled.  Encouraged to work on her exercise routine and continue rosuvastatin.  Current medicines are reviewed at length with the patient today.  The patient does not have concerns regarding medicines.  The following changes have been made:   Labs/ tests ordered today include:  Coronary CTA  Orders Placed This Encounter  Procedures   CT CORONARY MORPH W/CTA COR W/SCORE W/CA W/CM &/OR WO/CM   Basic metabolic panel   EKG [id number]    Disposition:    Follow up in 3-4 months with Skeet Latch, MD or Laurann Montana, NP   I,Alexis Herring,acting as a scribe for Skeet Latch, MD.,have documented all relevant documentation on the behalf of Skeet Latch, MD,as directed by  Skeet Latch, MD while in the presence of Skeet Latch, MD.  I, Whitmer Oval Linsey, MD have reviewed all documentation for this visit.  The documentation of the exam, diagnosis, procedures, and orders on 10/13/2022 are all accurate and complete.   Signed, Izamar Linden C. Oval Linsey, MD, [facility]  10/13/2022 8:53 AM    [place]

## 2022-10-13 NOTE — Assessment & Plan Note (Signed)
Mean gradient 8 mmHg on echo.  Repeat echo in 2 years

## 2022-10-13 NOTE — Assessment & Plan Note (Addendum)
BP elevated in the office.  At home it has been averaging in the 120-130s/70-80s.  She hasn't been as active and has been eating more.  She has been under a lot of stress.  Continue carvedilol and valsartan and work on lifestyle.

## 2022-10-13 NOTE — Assessment & Plan Note (Signed)
She has been more short of breath lately.  She notes that she hasn't been as active and has gained weight.  She also has non-exertional dyspnea and wonders if this is due to anxiety.  Recent echo was stable.  We will get a coronary CT-A to assess for obstructive CAD.  Continue rosuvastatin and carvedilol.

## 2022-10-23 DIAGNOSIS — I119 Hypertensive heart disease without heart failure: Secondary | ICD-10-CM | POA: Diagnosis not present

## 2022-10-23 DIAGNOSIS — Q221 Congenital pulmonary valve stenosis: Secondary | ICD-10-CM | POA: Diagnosis not present

## 2022-10-23 DIAGNOSIS — R0609 Other forms of dyspnea: Secondary | ICD-10-CM | POA: Diagnosis not present

## 2022-10-23 LAB — BASIC METABOLIC PANEL
BUN/Creatinine Ratio: 25 (ref 12–28)
BUN: 16 mg/dL (ref 8–27)
CO2: 27 mmol/L (ref 20–29)
Calcium: 9.6 mg/dL (ref 8.7–10.3)
Chloride: 99 mmol/L (ref 96–106)
Creatinine, Ser: 0.65 mg/dL (ref 0.57–1.00)
Glucose: 90 mg/dL (ref 70–99)
Potassium: 4.5 mmol/L (ref 3.5–5.2)
Sodium: 137 mmol/L (ref 134–144)
eGFR: 92 mL/min/{1.73_m2} (ref >59)

## 2022-11-02 ENCOUNTER — Telehealth (HOSPITAL_COMMUNITY): Payer: Self-pay | Admitting: *Deleted

## 2022-11-02 NOTE — Telephone Encounter (Signed)
Reaching out to patient to offer assistance regarding upcoming cardiac imaging study; pt verbalizes understanding of appt date/time, parking situation and where to check in, medications ordered, and verified current allergies; name and call back number provided for further questions should they arise  Monti Villers RN Navigator Cardiac Imaging [facility] [phone] office [phone] cell  Patient to take 100mg metoprolol tartrate two hours prior to her cardiac CT scan. She is aware to arrive at 8am. 

## 2022-11-04 ENCOUNTER — Other Ambulatory Visit: Payer: Self-pay | Admitting: Cardiovascular Disease

## 2022-11-04 ENCOUNTER — Ambulatory Visit (HOSPITAL_COMMUNITY)
Admission: RE | Admit: 2022-11-04 | Discharge: 2022-11-04 | Disposition: A | Discharge status: Home / Self Care | Payer: Medicare PPO | Source: Ambulatory Visit | Attending: Cardiovascular Disease | Admitting: Cardiovascular Disease

## 2022-11-04 DIAGNOSIS — R0609 Other forms of dyspnea: Secondary | ICD-10-CM | POA: Diagnosis not present

## 2022-11-04 DIAGNOSIS — Q221 Congenital pulmonary valve stenosis: Secondary | ICD-10-CM | POA: Diagnosis not present

## 2022-11-04 DIAGNOSIS — I119 Hypertensive heart disease without heart failure: Secondary | ICD-10-CM | POA: Insufficient documentation

## 2022-11-04 MED ORDER — METOPROLOL TARTRATE 5 MG/5ML IV SOLN
2.5000 mg | Freq: Once | INTRAVENOUS | Status: AC
Start: 2022-11-04 — End: 2022-11-04
  Administered 2022-11-04: 2.5 mg via INTRAVENOUS

## 2022-11-04 MED ORDER — IOHEXOL 350 MG/ML SOLN
100.0000 mL | Freq: Once | INTRAVENOUS | Status: AC | PRN
  Administered 2022-11-04: 100 mL via INTRAVENOUS

## 2022-11-04 MED ORDER — NITROGLYCERIN 0.4 MG SL SUBL
0.8000 mg | SUBLINGUAL_TABLET | Freq: Once | SUBLINGUAL | Status: AC
  Administered 2022-11-04: 0.8 mg via SUBLINGUAL

## 2022-11-04 MED ORDER — METOPROLOL TARTRATE 5 MG/5ML IV SOLN
INTRAVENOUS | Status: AC
  Filled 2022-11-04: qty 5

## 2022-11-04 MED ORDER — NITROGLYCERIN 0.4 MG SL SUBL
SUBLINGUAL_TABLET | SUBLINGUAL | Status: AC
  Filled 2022-11-04: qty 2

## 2022-11-04 NOTE — Telephone Encounter (Signed)
Rx request sent to pharmacy.  

## 2022-12-07 DIAGNOSIS — Z1231 Encounter for screening mammogram for malignant neoplasm of breast: Secondary | ICD-10-CM | POA: Diagnosis not present

## 2022-12-07 LAB — HM MAMMOGRAPHY

## 2022-12-10 ENCOUNTER — Telehealth: Payer: Self-pay

## 2022-12-10 NOTE — Telephone Encounter (Signed)
Pt called in stating that she is still experiencing some bad arthritis pain and would like some advice as to what she can do or take to help with this please. Please advise.  Cb#: [phone]

## 2022-12-11 ENCOUNTER — Other Ambulatory Visit: Payer: Self-pay | Admitting: Family Medicine

## 2022-12-11 ENCOUNTER — Encounter (HOSPITAL_BASED_OUTPATIENT_CLINIC_OR_DEPARTMENT_OTHER): Payer: Self-pay | Admitting: *Deleted

## 2022-12-11 ENCOUNTER — Other Ambulatory Visit: Payer: Self-pay

## 2022-12-11 DIAGNOSIS — G8929 Other chronic pain: Secondary | ICD-10-CM

## 2022-12-11 DIAGNOSIS — Z780 Asymptomatic menopausal state: Secondary | ICD-10-CM

## 2022-12-11 MED ORDER — TRAMADOL HCL 50 MG PO TABS: 50.0000 mg | ORAL_TABLET | Freq: Three times a day (TID) | ORAL | 0 refills | Status: AC | PRN

## 2022-12-24 ENCOUNTER — Other Ambulatory Visit: Payer: Self-pay | Admitting: Family Medicine

## 2022-12-24 NOTE — Telephone Encounter (Signed)
OV 07/17/22- system not recognizing, labs reviewed by provider Requested Prescriptions  Pending Prescriptions Disp Refills   meloxicam (MOBIC) 15 MG tablet [Pharmacy Med Name: MELOXICAM 15MG TABLETS] 30 tablet 2    Sig: TAKE 1 TABLET(15 MG) BY MOUTH DAILY. STOP DICLOFENAC     Analgesics:  COX2 Inhibitors Failed - 12/24/2022  8:50 AM      Failed - Manual Review: Labs are only required if the patient has taken medication for more than 8 weeks.      Failed - Valid encounter within last 12 months    Recent Outpatient Visits           1 year ago Recurrent pain of right knee   [place], Cammie Mcgee, MD   1 year ago Recurrent pain of right knee   [place] [facility] [facility], Cammie Mcgee, MD   2 years ago Bilateral carpal tunnel syndrome   [place] Susy Frizzle, MD   3 years ago Visit for suture removal   [place] Susy Frizzle, MD   4 years ago General medical exam   [place] Susy Frizzle, MD       Future Appointments             In 2 weeks Loel Dubonnet, NP [facility]            Passed - HGB in normal range and within 360 days    Hemoglobin  Date Value Ref Range Status  07/13/2022 13.3 11.7 - 15.5 g/dL Final         Passed - Cr in normal range and within 360 days    Creat  Date Value Ref Range Status  07/13/2022 0.56 (L) 0.60 - 1.00 mg/dL Final   Creatinine, Ser  Date Value Ref Range Status  10/23/2022 0.65 0.57 - 1.00 mg/dL Final         Passed - HCT in normal range and within 360 days    HCT  Date Value Ref Range Status  07/13/2022 39.9 35.0 - 45.0 % Final         Passed - AST in normal range and within 360 days    AST  Date Value Ref Range Status  07/13/2022 17 10 - 35 U/L Final         Passed - ALT in normal range and within 360 days    ALT  Date Value Ref Range Status  07/13/2022 11 6 - 29 U/L Final         Passed  - eGFR is 30 or above and within 360 days    GFR, Est African American  Date Value Ref Range Status  04/13/2017 >89 >=60 mL/min Final   GFR calc Af Amer  Date Value Ref Range Status  02/22/2019 103 >59 mL/min/1.73 Final   GFR, Est Non African American  Date Value Ref Range Status  04/13/2017 89 >=60 mL/min Final   GFR calc non Af Amer  Date Value Ref Range Status  02/22/2019 90 >59 mL/min/1.73 Final   GFR  Date Value Ref Range Status  04/04/2015 84.31 >60.00 mL/min Final   eGFR  Date Value Ref Range Status  10/23/2022 92 >59 mL/min/1.73 Final         Passed - Patient is not pregnant

## 2023-01-03 ENCOUNTER — Other Ambulatory Visit: Payer: Self-pay | Admitting: Cardiovascular Disease

## 2023-01-04 NOTE — Telephone Encounter (Signed)
Rx request sent to pharmacy.  

## 2023-01-08 ENCOUNTER — Encounter: Payer: Self-pay | Admitting: Family Medicine

## 2023-01-12 ENCOUNTER — Ambulatory Visit (HOSPITAL_BASED_OUTPATIENT_CLINIC_OR_DEPARTMENT_OTHER): Payer: Medicare PPO | Admitting: Family

## 2023-01-19 ENCOUNTER — Encounter (HOSPITAL_BASED_OUTPATIENT_CLINIC_OR_DEPARTMENT_OTHER): Payer: Self-pay | Admitting: Family

## 2023-01-19 ENCOUNTER — Ambulatory Visit (HOSPITAL_BASED_OUTPATIENT_CLINIC_OR_DEPARTMENT_OTHER): Payer: Medicare PPO | Admitting: Family

## 2023-01-19 VITALS — BP 122/72 | HR 70 | Ht 65.0 in | Wt 166.0 lb

## 2023-01-19 DIAGNOSIS — E782 Mixed hyperlipidemia: Secondary | ICD-10-CM | POA: Diagnosis not present

## 2023-01-19 DIAGNOSIS — Q221 Congenital pulmonary valve stenosis: Secondary | ICD-10-CM

## 2023-01-19 DIAGNOSIS — I119 Hypertensive heart disease without heart failure: Secondary | ICD-10-CM | POA: Diagnosis not present

## 2023-01-19 NOTE — Progress Notes (Signed)
Office Visit    Patient Name: Heather Hoover Date of Encounter: 01/19/2023  PCP:  Susy Frizzle, MD   [place]  Cardiologist:  Skeet Latch, MD  Advanced Practice Provider:  No care team member to display Electrophysiologist:  None      Chief Complaint    Heather Hoover is a 77 y.o. female presents today for blood pressure and CTA follow-up.  Past Medical History    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 10/28/2010   echo   Osteopenia 12/2018   T score -1.6 FRAX 10% / 1.5%   Past Surgical History:  Procedure Laterality Date   ABDOMINAL HYSTERECTOMY  02/07/1981   RSO   APPENDECTOMY     BREAST SURGERY     CARDIAC CATHETERIZATION     when in 3rd grade and age 38   CARPAL TUNNEL RELEASE Left 02/06/2021   Procedure: LEFT CARPAL TUNNEL RELEASE;  Surgeon: Daryll Brod, MD;  Location: [place];  Service: Orthopedics;  Laterality: Left;  IV REGIONAL FOREARM BLOCK 45 MINUTES   CARPAL TUNNEL RELEASE Right 06/03/2021   Procedure: RIGHT CARPAL TUNNEL RELEASE;  Surgeon: Daryll Brod, MD;  Location: [facility];  Service: Orthopedics;  Laterality: Right;  45 MIN   COSMETIC SURGERY     forehead plastic surgery     MOH's surgery   FRACTURE SURGERY     KNEE ARTHROSCOPY     right    Allergies  Allergies  Allergen Reactions   Augmentin [Amoxicillin-Pot Clavulanate] Diarrhea    Did it involve swelling of the face/tongue/throat, SOB, or low BP? yes Did it involve sudden or severe rash/hives, skin peeling, or any reaction on the inside of your mouth or nose? no Did you need to seek medical attention at a hospital or doctor's office? no When did it last happen?      2000 If all above answers are "NO", may proceed with  cephalosporin use.    Niacin-Lovastatin Er     Flushing,itching,syncope   Kenalog [Triamcinolone] Palpitations    History of Present Illness    Heather Hoover is a 77 y.o. female with a hx of hypertension, hyperlipidemia, congenital pulmonary stenosis last seen 10/13/22.  Prior patient of [name]. LHC at ages 71 and 25 with mild pulmonic stenosis. Echo 04/2016 LVEF 55-60% with mild MR, mild AI, mild pulmonic stenosis. HCTZ reduced due to hypotension in setting of 50 lb weight loss. Repeat echo 02/2019 LVEF 60-65%, gr2DD, pulmonary pressure mildly elevated 9.41mmHg. Carvedilol reduced due to hypotension. Tested for Sjogren's due to dry mouth which was negative. Nerve conduction studies confirmed carpel tunnel. Valsartan increased due to hypertension with improvement in BP.   Last saw Dr. Oval Linsey with elevated BP in clinic but well controlled at home. Due to exertional dyspnea, chest pain cardiac CTA ordered with coronary calcium score of 0.   Presents today independently. BP at home routinely <130/80. Feeling well since last seen. Reports no shortness of breath nor dyspnea on exertion. Reports no chest pain, pressure, or tightness. No edema, orthopnea, PND. Reports no palpitations.  She was reassured by CTA results. Has goal to increase her physical activity, recently had injection in her knee which  she hopes will increase her mobility.   EKGs/Labs/Other Studies Reviewed:   The following studies were reviewed today:  Echo 08/21/22    1. Left ventricular ejection fraction, by estimation, is 60 to 65%. The  left ventricle has normal function. The left ventricle has no regional  wall motion abnormalities. Left ventricular diastolic parameters were  normal.   2. Right ventricular systolic function is normal. The right ventricular  size is normal.   3. Mild mitral valve regurgitation.   4. The aortic valve is tricuspid. Aortic valve regurgitation is mild.  Aortic valve sclerosis is  present, with no evidence of aortic valve  stenosis.   5. PV is thickened, difficult to see. Peak and mean gradients through the  valve are 15 and 8 mm Hg respectively. No significant change from echo  report in 2020.   Coronary CTA 11/04/22 IMPRESSION: 1. Calcium Score 0   2.  Normal right dominant coronary arteries   3.  Normal ascending thoracic aorta 3.3 cm   4.  Dilated pulmonary arteries particularly the left   5.  The PV is tri leaflet and thickened with some doming in systole   6.  Bi atrial enlargement with severe RAE   7.  Normal PV drainage   8.  No ASD/PFO   EKG:  EKG is not ordered today.    Recent Labs: 07/13/2022: ALT 11; Hemoglobin 13.3; Platelets 147 10/23/2022: BUN 16; Creatinine, Ser 0.65; Potassium 4.5; Sodium 137   Recent Lipid Panel    Component Value Date/Time   CHOL 147 07/13/2022 0842   CHOL 142 02/22/2019 0829   TRIG 89 07/13/2022 0842   HDL 72 07/13/2022 0842   HDL 67 02/22/2019 0829   CHOLHDL 2.0 07/13/2022 0842   VLDL 22 04/13/2017 0820   LDLCALC 58 07/13/2022 0842    Risk Assessment/Calculations:     Home Medications   Current Meds  Medication Sig   acetaminophen (TYLENOL) 325 MG tablet Take 650 mg by mouth every 6 (six) hours as needed for mild pain or headache.   carvedilol (COREG) 6.25 MG tablet TAKE 1 TABLET(6.25 MG) BY MOUTH TWICE DAILY WITH A MEAL   Cholecalciferol (VITAMIN D-3 PO) Take 2,000 Units by mouth daily. Taking 2000 daily   Famotidine (PEPCID PO) Take by mouth.   meloxicam (MOBIC) 15 MG tablet TAKE 1 TABLET(15 MG) BY MOUTH DAILY. STOP DICLOFENAC   polyvinyl alcohol (LIQUIFILM TEARS) 1.4 % ophthalmic solution Place 1 drop into both eyes as needed for dry eyes.   potassium chloride (KLOR-CON) 10 MEQ tablet TAKE 1 TABLET(10 MEQ) BY MOUTH DAILY   PREVIDENT 5000 DRY MOUTH 1.1 % GEL dental gel Place onto teeth.   rosuvastatin (CRESTOR) 20 MG tablet TAKE 1 TABLET(20 MG) BY MOUTH DAILY   valsartan (DIOVAN) 160 MG tablet  Take 1 tablet (160 mg total) by mouth daily.     Review of Systems      All other systems reviewed and are otherwise negative except as noted above.  Physical Exam    VS:  BP 122/72   Pulse 70   Ht 5\' 5"  (1.651 m)   Wt 166 lb (75.3 kg)   BMI 27.62 kg/m  , BMI Body mass index is 27.62 kg/m.  Wt Readings from Last 3 Encounters:  01/19/23 166 lb (75.3 kg)  10/13/22 160 lb 12.8 oz (72.9 kg)  07/17/22 160 lb (72.6 kg)     GEN: Well nourished, well developed, in no acute distress. HEENT: normal. Neck:  Supple, no JVD, carotid bruits, or masses. Cardiac: RRR, no murmurs, rubs, or gallops. No clubbing, cyanosis, edema.  Radials/PT 2+ and equal bilaterally.  Respiratory:  Respirations regular and unlabored, clear to auscultation bilaterally. GI: Soft, nontender, nondistended. MS: No deformity or atrophy. Skin: Warm and dry, no rash. Neuro:  Strength and sensation are intact. Psych: Normal affect.  Assessment & Plan    Benign hypertensive heart disease without heart failure - Euvolemic and well compensated on exam. BP well controlled. Continue current antihypertensive regimen: Carvedilol 6.25 mg tablet BID, Valsartan Valsartan 160 mg tablet QD. She plans to increase activity. Info on Right Start program at [place] provided. Heart healthy diet and regular cardiovascular exercise encouraged.   Mild pulmonic stenosis - Mild by echo 08/21/22. Dr. Oval Linsey recommended repeat echocardiogram 08/2024 which has already been scheduled.   Exertional dyspnea - Cardiac CTA 11/04/22 coronary calcium score of 0. Symptoms have resolved no further work-up needed.   HLD - Continue Rosuvastatin.       Disposition: Follow up in 6 month(s) with Skeet Latch, MD or APP.  Signed, Loel Dubonnet, NP 01/19/2023, 8:21 PM [place]

## 2023-01-19 NOTE — Patient Instructions (Signed)
Medication Instructions:  Your Physician recommend you continue on your current medication as directed.    *If you need a refill on your cardiac medications before your next appointment, please call your pharmacy*  Follow-Up: At [facility], you and your health needs are our priority.  As part of our continuing mission to provide you with exceptional heart care, we have created designated Provider Care Teams.  These Care Teams include your primary Cardiologist (physician) and Advanced Practice Providers (APPs -  Physician Assistants and Nurse Practitioners) who all work together to provide you with the care you need, when you need it.  We recommend signing up for the patient portal called "MyChart".  Sign up information is provided on this After Visit Summary.  MyChart is used to connect with patients for Virtual Visits (Telemedicine).  Patients are able to view lab/test results, encounter notes, upcoming appointments, etc.  Non-urgent messages can be sent to your provider as well.   To learn more about what you can do with MyChart, go to [email or web address]://[email or web address].com.    Your next appointment:   6 month(s)  Provider:   Tiffany Swannanoa, MD or Caitlin Walker, NP    Other Instructions:  Heart Healthy Diet Recommendations: A low-salt diet is recommended. Meats should be grilled, baked, or boiled. Avoid fried foods. Focus on lean protein sources like fish or chicken with vegetables and fruits. The American Heart Association is a GREAT resource!  American Heart Association Diet and Lifeystyle Recommendations   Exercise recommendations: The American Heart Association recommends 150 minutes of moderate intensity exercise weekly. Try 30 minutes of moderate intensity exercise 4-5 times per week. This could include walking, jogging, or swimming.   

## 2023-01-29 ENCOUNTER — Ambulatory Visit
Admission: RE | Admit: 2023-01-29 | Discharge: 2023-01-29 | Disposition: A | Discharge status: Home / Self Care | Payer: Medicare PPO | Source: Ambulatory Visit | Attending: Family Medicine | Admitting: Family Medicine

## 2023-01-29 DIAGNOSIS — Z780 Asymptomatic menopausal state: Secondary | ICD-10-CM

## 2023-02-02 ENCOUNTER — Other Ambulatory Visit: Payer: Self-pay

## 2023-02-02 MED ORDER — ALENDRONATE SODIUM 70 MG PO TABS: 70.0000 mg | ORAL_TABLET | ORAL | 11 refills | Status: DC

## 2023-02-12 ENCOUNTER — Encounter (HOSPITAL_BASED_OUTPATIENT_CLINIC_OR_DEPARTMENT_OTHER): Payer: Self-pay | Admitting: Obstetrics & Gynecology

## 2023-02-12 ENCOUNTER — Ambulatory Visit (INDEPENDENT_AMBULATORY_CARE_PROVIDER_SITE_OTHER): Payer: Medicare PPO | Admitting: Obstetrics & Gynecology

## 2023-02-12 VITALS — BP 132/88 | HR 64 | Ht 63.0 in | Wt 165.4 lb

## 2023-02-12 DIAGNOSIS — Z9189 Other specified personal risk factors, not elsewhere classified: Secondary | ICD-10-CM

## 2023-02-12 DIAGNOSIS — M816 Localized osteoporosis [Lequesne]: Secondary | ICD-10-CM

## 2023-02-12 DIAGNOSIS — N3281 Overactive bladder: Secondary | ICD-10-CM

## 2023-02-12 DIAGNOSIS — Z01419 Encounter for gynecological examination (general) (routine) without abnormal findings: Secondary | ICD-10-CM

## 2023-02-12 NOTE — Progress Notes (Unsigned)
77 y.o. G15P2002 Married White or Caucasian female here for breast and pelvic exam.  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  Denies vaginal bleeding.  H/o TAh with RSO 1982.    Just had BMD done.  This showed early osteoporosis.  She is on fosamax now.    H/o OAB.  Did go to PT last year.  Felt this helped.  Using Vit E for comfort.  Doesn't need prescription for this.  No LMP recorded. Patient has had a hysterectomy.            Health Maintenance: PCP:  Dr. Dennard Schaumann.  Last wellness appt was 06/2022.  Did blood work at that appt:  yes Vaccines are up to date:  yes Colonoscopy:  2021, follow up  MMG:  12/07/2022 BMD:  02/01/2023 Last pap smear:  01/30/2022 Negative.   H/o abnormal pap smear:  no    reports that she has never smoked. She has never used smokeless tobacco. She reports current alcohol use. She reports that she does not use drugs.  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 10/28/2010   echo   Osteopenia 12/2018   T score -1.6 FRAX 10% / 1.5%    Past Surgical History:  Procedure Laterality Date   ABDOMINAL HYSTERECTOMY  02/07/1981   RSO   APPENDECTOMY     BREAST SURGERY     CARDIAC CATHETERIZATION     when in 3rd grade and age 36   CARPAL TUNNEL RELEASE Left 02/06/2021   Procedure: LEFT CARPAL TUNNEL RELEASE;  Surgeon: Daryll Brod, MD;  Location: [place];  Service: Orthopedics;  Laterality: Left;  IV REGIONAL FOREARM BLOCK 45 MINUTES   CARPAL TUNNEL RELEASE Right 06/03/2021   Procedure: RIGHT CARPAL TUNNEL RELEASE;  Surgeon: Daryll Brod, MD;  Location: [place];  Service: Orthopedics;  Laterality: Right;  45 MIN   COSMETIC SURGERY     forehead plastic surgery     MOH's surgery   FRACTURE SURGERY     KNEE ARTHROSCOPY     right    Current Outpatient  Medications  Medication Sig Dispense Refill   acetaminophen (TYLENOL) 325 MG tablet Take 650 mg by mouth every 6 (six) hours as needed for mild pain or headache.     alendronate (FOSAMAX) 70 MG tablet Take 1 tablet (70 mg total) by mouth every 7 (seven) days. Take with a full glass of water on an empty stomach. 4 tablet 11   carvedilol (COREG) 6.25 MG tablet TAKE 1 TABLET(6.25 MG) BY MOUTH TWICE DAILY WITH A MEAL 180 tablet 1   Cholecalciferol (VITAMIN D-3 PO) Take 2,000 Units by mouth daily. Taking 2000 daily     Famotidine (PEPCID PO) Take by mouth.     meloxicam (MOBIC) 15 MG tablet TAKE 1 TABLET(15 MG) BY MOUTH DAILY. STOP DICLOFENAC 30 tablet 2   polyvinyl alcohol (LIQUIFILM TEARS) 1.4 % ophthalmic solution Place 1 drop into both eyes as needed for dry eyes.     potassium chloride (KLOR-CON) 10 MEQ tablet TAKE 1 TABLET(10 MEQ) BY MOUTH DAILY 90 tablet 2   PREVIDENT 5000 DRY MOUTH 1.1 % GEL dental gel Place onto teeth.     rosuvastatin (CRESTOR) 20 MG tablet TAKE 1 TABLET(20 MG) BY  MOUTH DAILY 90 tablet 1   valsartan (DIOVAN) 160 MG tablet Take 1 tablet (160 mg total) by mouth daily. 90 tablet 3   No current facility-administered medications for this visit.    Family History  Problem Relation Age of Onset   Hypertension Mother    Breast cancer Mother 63       Breast    Cancer Mother        uterine thye think   Cancer Father 92       prostate metastized to bone   Hypertension Father    Arthritis Father    Hypertension Sister    Breast cancer Sister 42   Cancer Sister 51       uterine   Heart failure Sister    Muscular dystrophy Brother     Review of Systems  Constitutional: Negative.   Genitourinary: Negative.     Exam:   BP 132/88   Pulse 64   Ht '5\' 3"'$  (1.6 m) Comment: Reported  Wt 165 lb 6.4 oz (75 kg)   BMI 29.30 kg/m   Height: '5\' 3"'$  (160 cm) (Reported)  General appearance: alert, cooperative and appears stated age Breasts: normal appearance, no masses or  tenderness Abdomen: soft, non-tender; bowel sounds normal; no masses,  no organomegaly Lymph nodes: Cervical, supraclavicular, and axillary nodes normal.  No abnormal inguinal nodes palpated Neurologic: Grossly normal  Pelvic: External genitalia:  no lesions              Urethra:  normal appearing urethra with no masses, tenderness or lesions              Bartholins and Skenes: normal                 Vagina: normal appearing vagina with atrophic changes and no discharge, no lesions              Cervix: absent              Pap taken: Yes.   Bimanual Exam:  Uterus:  uterus absent              Adnexa: no mass, fullness, tenderness               Rectovaginal: Confirms               Anus:  normal sphincter tone, no lesions  Chaperone, Octaviano Batty, CMA, was present for exam.  Assessment/Plan: 1. Encntr for gyn exam (general) (routine) w/o abn findings - Pap smear obtained - Mammogram 12/07/2022 - Colonoscopy 2021, follow up 5 year - Bone mineral density 02/01/2023 - lab work done with PCP, Dr. Dennard Schaumann - vaccines reviewed/updated  2. DES exposure in utero - Cytology - PAP( [place])  3. OAB (overactive bladder) - did pelvic PT last year and is using Vit E with improvement.  No other treatment desired at this time  4. Localized osteoporosis without current pathological fracture - on Fosamax now

## 2023-02-12 NOTE — Patient Instructions (Signed)
Call [phone] to schedule an appointment at [facility].    Call [phone] to scheduled at the [facility], [address] [place]  [place], [place] [address]  Mammograms can also be self-scheduled online through [facility].

## 2023-02-21 ENCOUNTER — Encounter (HOSPITAL_BASED_OUTPATIENT_CLINIC_OR_DEPARTMENT_OTHER): Payer: Self-pay | Admitting: Obstetrics & Gynecology

## 2023-02-22 LAB — CYTOLOGY - PAP: Diagnosis: NEGATIVE

## 2023-03-20 ENCOUNTER — Other Ambulatory Visit: Payer: Self-pay | Admitting: Family Medicine

## 2023-03-22 NOTE — Telephone Encounter (Signed)
Requested Prescriptions  Pending Prescriptions Disp Refills   meloxicam (MOBIC) 15 MG tablet [Pharmacy Med Name: MELOXICAM 15MG  TABLETS] 90 tablet     Sig: TAKE 1 TABLET(15 MG) BY MOUTH DAILY. STOP DICLOFENAC     Analgesics:  COX2 Inhibitors Failed - 03/20/2023  5:06 PM      Failed - Manual Review: Labs are only required if the patient has taken medication for more than 8 weeks.      Failed - Valid encounter within last 12 months    Recent Outpatient Visits           1 year ago Recurrent pain of right knee   [place] Pickard, Cammie Mcgee, MD   1 year ago Recurrent pain of right knee   [place] Dennard Schaumann, Cammie Mcgee, MD   2 years ago Bilateral carpal tunnel syndrome   [place] Susy Frizzle, MD   4 years ago Visit for suture removal   [place] Susy Frizzle, MD   4 years ago General medical exam   [place] Susy Frizzle, MD       Future Appointments             In 3 months Skeet Latch, MD [facility] [place] - HGB in normal range and within 360 days    Hemoglobin  Date Value Ref Range Status  07/13/2022 13.3 11.7 - 15.5 g/dL Final         Passed - Cr in normal range and within 360 days    Creat  Date Value Ref Range Status  07/13/2022 0.56 (L) 0.60 - 1.00 mg/dL Final   Creatinine, Ser  Date Value Ref Range Status  10/23/2022 0.65 0.57 - 1.00 mg/dL Final         Passed - HCT in normal range and within 360 days    HCT  Date Value Ref Range Status  07/13/2022 39.9 35.0 - 45.0 % Final         Passed - AST in normal range and within 360 days    AST  Date Value Ref Range Status  07/13/2022 17 10 - 35 U/L Final         Passed - ALT in normal range and within 360 days    ALT  Date Value Ref Range Status  07/13/2022 11 6 - 29 U/L Final         Passed - eGFR is 30 or above and within 360 days     GFR, Est African American  Date Value Ref Range Status  04/13/2017 >89 >=60 mL/min Final   GFR calc Af Amer  Date Value Ref Range Status  02/22/2019 103 >59 mL/min/1.73 Final   GFR, Est Non African American  Date Value Ref Range Status  04/13/2017 89 >=60 mL/min Final   GFR calc non Af Amer  Date Value Ref Range Status  02/22/2019 90 >59 mL/min/1.73 Final   GFR  Date Value Ref Range Status  04/04/2015 84.31 >60.00 mL/min Final   eGFR  Date Value Ref Range Status  10/23/2022 92 >59 mL/min/1.73 Final         Passed - Patient is not pregnant

## 2023-05-04 ENCOUNTER — Other Ambulatory Visit: Payer: Self-pay | Admitting: Cardiovascular Disease

## 2023-05-04 NOTE — Telephone Encounter (Signed)
Rx request sent to pharmacy.  

## 2023-05-10 DIAGNOSIS — M7062 Trochanteric bursitis, left hip: Secondary | ICD-10-CM | POA: Insufficient documentation

## 2023-06-12 ENCOUNTER — Other Ambulatory Visit: Payer: Self-pay | Admitting: Family Medicine

## 2023-06-14 NOTE — Telephone Encounter (Signed)
Requested medication (s) are due for refill today: Yes  Requested medication (s) are on the active medication list: Yes  Last refill:  03/22/23  Future visit scheduled: No  Notes to clinic:  Manual review.    Requested Prescriptions  Pending Prescriptions Disp Refills   meloxicam (MOBIC) 15 MG tablet [Pharmacy Med Name: MELOXICAM 15MG  TABLETS] 90 tablet 0    Sig: TAKE 1 TABLET(15 MG) BY MOUTH DAILY. STOP DICLOFENAC     Analgesics:  COX2 Inhibitors Failed - 06/12/2023 12:15 PM      Failed - Manual Review: Labs are only required if the patient has taken medication for more than 8 weeks.      Failed - Valid encounter within last 12 months    Recent Outpatient Visits           1 year ago Recurrent pain of right knee   [facility] Pickard, Priscille Heidelberg, MD   1 year ago Recurrent pain of right knee   [facility] Tanya Nones, Priscille Heidelberg, MD   3 years ago Bilateral carpal tunnel syndrome   [facility] Donita Brooks, MD   4 years ago Visit for suture removal   [facility] Donita Brooks, MD   4 years ago General medical exam   [facility] Donita Brooks, MD       Future Appointments             In 1 month Chilton Si, MD [facility] at [facility], [place]            Passed - HGB in normal range and within 360 days    Hemoglobin  Date Value Ref Range Status  07/13/2022 13.3 11.7 - 15.5 g/dL Final         Passed - Cr in normal range and within 360 days    Creat  Date Value Ref Range Status  07/13/2022 0.56 (L) 0.60 - 1.00 mg/dL Final   Creatinine, Ser  Date Value Ref Range Status  10/23/2022 0.65 0.57 - 1.00 mg/dL Final         Passed - HCT in normal range and within 360 days    HCT  Date Value Ref Range Status  07/13/2022 39.9 35.0 - 45.0 % Final         Passed - AST in normal range and within 360 days    AST  Date Value Ref Range Status   07/13/2022 17 10 - 35 U/L Final         Passed - ALT in normal range and within 360 days    ALT  Date Value Ref Range Status  07/13/2022 11 6 - 29 U/L Final         Passed - eGFR is 30 or above and within 360 days    GFR, Est African American  Date Value Ref Range Status  04/13/2017 >89 >=60 mL/min Final   GFR calc Af Amer  Date Value Ref Range Status  02/22/2019 103 >59 mL/min/1.73 Final   GFR, Est Non African American  Date Value Ref Range Status  04/13/2017 89 >=60 mL/min Final   GFR calc non Af Amer  Date Value Ref Range Status  02/22/2019 90 >59 mL/min/1.73 Final   GFR  Date Value Ref Range Status  04/04/2015 84.31 >60.00 mL/min Final   eGFR  Date Value Ref Range Status  10/23/2022 92 >59 mL/min/1.73 Final  Passed - Patient is not pregnant

## 2023-06-29 ENCOUNTER — Telehealth: Payer: Self-pay | Admitting: *Deleted

## 2023-06-29 NOTE — Telephone Encounter (Signed)
   Pre-operative Risk Assessment    Patient Name: Heather Hoover  DOB: 04/14/1946 MRN: [id number]      Request for Surgical Clearance    Procedure:   RIGHT TOTAL KNEE ARTHROPLASTY  Date of Surgery:  Clearance TBD                                 Surgeon:  DR. Samson Frederic Surgeon's Group or Practice Name:  [facility] Phone number:  [phone] ATTN: KERRI MAZE Fax number:  [phone]   Type of Clearance Requested:   - Medical ; NO MEDICATIONS LISTED AS NEEDING TO BE HELD   Type of Anesthesia:  Spinal   Additional requests/questions:    Elpidio Anis   06/29/2023, 12:48 PM

## 2023-06-29 NOTE — Telephone Encounter (Signed)
   Name: [name]  DOB: 1946/02/01  MRN: [id number]  Primary Cardiologist: Chilton Si, MD   Preoperative team, please contact this patient and set up a phone call appointment for further preoperative risk assessment. Please obtain consent and complete medication review. Thank you for your help.  I confirm that guidance regarding antiplatelet and oral anticoagulation therapy has been completed and, if necessary, noted below.  None requested.   Carlos Levering, NP 06/29/2023, 3:43 PM [facility]

## 2023-06-30 NOTE — Telephone Encounter (Signed)
Pt actually has an office visit scheduled with Dr. Duke Salvia 07/15/23, clearance will be addressed at that time, per pt.  Will send back to the requesting surgeon's office to make them aware.

## 2023-07-08 ENCOUNTER — Ambulatory Visit: Payer: Medicare PPO | Admitting: Family Medicine

## 2023-07-08 ENCOUNTER — Encounter: Payer: Self-pay | Admitting: Family Medicine

## 2023-07-08 VITALS — BP 136/84 | HR 59 | Temp 98.7°F | Ht 63.0 in | Wt 151.0 lb

## 2023-07-08 DIAGNOSIS — I10 Essential (primary) hypertension: Secondary | ICD-10-CM

## 2023-07-08 NOTE — Progress Notes (Signed)
Subjective:    Patient ID: Heather Hoover, female    DOB: 11-10-1946, 77 y.o.   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  She is planning to have a knee replacement in the right knee as soon as possible.  Past medical history significant for hypertension and hyperlipidemia.  Her blood pressure today is well-controlled.  She denies any chest pain, shortness of breath, dyspnea on exertion.  She is due for lab work.  Otherwise there is no contraindications to surgery.  She sees cardiology.  Had echocardiogram in September of last year with normal ejection fraction of 65% and no evidence of diastolic dysfunction.  There is also no severe valvular disease.   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 10/28/2010   echo   Osteopenia 12/2018   T score -1.6 FRAX 10% / 1.5%   Past Surgical History:  Procedure Laterality Date   ABDOMINAL HYSTERECTOMY  02/07/1981   RSO   APPENDECTOMY     BREAST SURGERY     CARDIAC CATHETERIZATION     when in 3rd grade and age 11   CARPAL TUNNEL RELEASE Left 02/06/2021   Procedure: LEFT CARPAL TUNNEL RELEASE;  Surgeon: Cindee Salt, MD;  Location: [facility];  Service: Orthopedics;  Laterality: Left;  IV REGIONAL FOREARM BLOCK 45 MINUTES   CARPAL TUNNEL RELEASE Right 06/03/2021   Procedure: RIGHT CARPAL TUNNEL RELEASE;  Surgeon: Cindee Salt, MD;  Location: [facility];  Service: Orthopedics;  Laterality: Right;  45 MIN   COSMETIC SURGERY     forehead plastic surgery     MOH's surgery   FRACTURE SURGERY     KNEE ARTHROSCOPY     right   Current Outpatient Medications on File Prior to Visit  Medication Sig Dispense Refill   acetaminophen (TYLENOL) 325 MG  tablet Take 650 mg by mouth every 6 (six) hours as needed for mild pain or headache.     alendronate (FOSAMAX) 70 MG tablet Take 1 tablet (70 mg total) by mouth every 7 (seven) days. Take with a full glass of water on an empty stomach. 4 tablet 11   carvedilol (COREG) 6.25 MG tablet TAKE 1 TABLET(6.25 MG) BY MOUTH TWICE DAILY WITH A MEAL 180 tablet 1   Cholecalciferol (VITAMIN D-3 PO) Take 2,000 Units by mouth daily. Taking 2000 daily     Famotidine (PEPCID PO) Take by mouth.     meloxicam (MOBIC) 15 MG tablet TAKE 1 TABLET(15 MG) BY MOUTH DAILY. STOP DICLOFENAC 30 tablet 0   polyvinyl alcohol (LIQUIFILM TEARS) 1.4 % ophthalmic solution Place 1 drop into both eyes as needed for dry eyes.     potassium chloride (KLOR-CON) 10 MEQ tablet TAKE 1 TABLET(10 MEQ) BY MOUTH DAILY 90 tablet 1   PREVIDENT 5000 DRY MOUTH 1.1 % GEL dental gel Place onto teeth.     rosuvastatin (CRESTOR) 20 MG tablet TAKE 1 TABLET(20 MG) BY MOUTH DAILY 90 tablet 1   valsartan (DIOVAN) 160 MG tablet TAKE 1 TABLET(160 MG) BY MOUTH DAILY 90 tablet 1   No current facility-administered medications on file prior to visit.   Allergies  Allergen Reactions  Augmentin [Amoxicillin-Pot Clavulanate] Diarrhea    Did it involve swelling of the face/tongue/throat, SOB, or low BP? yes Did it involve sudden or severe rash/hives, skin peeling, or any reaction on the inside of your mouth or nose? no Did you need to seek medical attention at a hospital or doctor's office? no When did it last happen?      2000 If all above answers are "NO", may proceed with cephalosporin use.    Niacin-Lovastatin Er     Flushing,itching,syncope   Kenalog [Triamcinolone] Palpitations   Social History   Socioeconomic History   Marital status: Married    Spouse name: Not on file   Number of children: Not on file   Years of education: Not on file   Highest education level: Bachelor's degree (e.g., BA, AB, BS)  Occupational History   Not on file   Tobacco Use   Smoking status: Never   Smokeless tobacco: Never  Vaping Use   Vaping status: Never Used  Substance and Sexual Activity   Alcohol use: Yes    Comment: Occas   Drug use: No   Sexual activity: Not Currently    Birth control/protection: Post-menopausal    Comment: 1st intercourse 64 yo-1 partner  Other Topics Concern   Not on file  Social History Narrative   Not on file   Social Determinants of Health   Financial Resource Strain: Low Risk  (07/01/2023)   Overall Financial Resource Strain (CARDIA)    Difficulty of Paying Living Expenses: Not hard at all  Food Insecurity: No Food Insecurity (07/01/2023)   Hunger Vital Sign    Worried About Running Out of Food in the Last Year: Never true    Ran Out of Food in the Last Year: Never true  Transportation Needs: No Transportation Needs (07/01/2023)   PRAPARE - Administrator, Civil Service (Medical): No    Lack of Transportation (Non-Medical): No  Physical Activity: Insufficiently Active (07/01/2023)   Exercise Vital Sign    Days of Exercise per Week: 3 days    Minutes of Exercise per Session: 30 min  Stress: No Stress Concern Present (07/01/2023)   [employer] of Occupational Health - Occupational Stress Questionnaire    Feeling of Stress : Only a little  Social Connections: Moderately Integrated (07/01/2023)   Social Connection and Isolation Panel [NHANES]    Frequency of Communication with Friends and Family: Twice a week    Frequency of Social Gatherings with Friends and Family: Twice a week    Attends Religious Services: Never    Database administrator or Organizations: Yes    Attends Engineer, structural: More than 4 times per year    Marital Status: Married  Catering manager Violence: Not on file   Family History  Problem Relation Age of Onset   Hypertension Mother    Breast cancer Mother 86       Breast    Cancer Mother        uterine thye think   Cancer Father 77       prostate  metastized to bone   Hypertension Father    Arthritis Father    Hypertension Sister    Breast cancer Sister 26   Cancer Sister 71       uterine   Heart failure Sister    Muscular dystrophy Brother       Review of Systems  All other systems reviewed and are negative.  Objective:   Physical Exam Vitals reviewed.  Constitutional:      General: She is not in acute distress.    Appearance: She is well-developed. She is not diaphoretic.  HENT:     Head: Normocephalic and atraumatic.     Right Ear: External ear normal.     Left Ear: External ear normal.     Nose: Nose normal.     Mouth/Throat:     Pharynx: No oropharyngeal exudate.  Eyes:     General: No scleral icterus.       Right eye: No discharge.        Left eye: No discharge.     Conjunctiva/sclera: Conjunctivae normal.     Pupils: Pupils are equal, round, and reactive to light.  Neck:     Thyroid: No thyromegaly.     Vascular: No JVD.     Trachea: No tracheal deviation.  Cardiovascular:     Rate and Rhythm: Normal rate and regular rhythm.     Heart sounds: Murmur heard.     No friction rub. No gallop.  Pulmonary:     Effort: Pulmonary effort is normal. No respiratory distress.     Breath sounds: Normal breath sounds. No stridor. No wheezing or rales.  Chest:     Chest wall: No tenderness.  Abdominal:     General: Bowel sounds are normal. There is no distension.     Palpations: Abdomen is soft. There is no mass.     Tenderness: There is no abdominal tenderness. There is no guarding or rebound.  Musculoskeletal:        General: No tenderness. Normal range of motion.     Cervical back: Normal range of motion and neck supple.  Lymphadenopathy:     Cervical: No cervical adenopathy.  Skin:    General: Skin is warm.     Coloration: Skin is not pale.     Findings: No erythema or rash.  Neurological:     Mental Status: She is alert and oriented to person, place, and time.     Cranial Nerves: No cranial  nerve deficit.     Motor: No abnormal muscle tone.     Coordination: Coordination normal.     Deep Tendon Reflexes: Reflexes are normal and symmetric.  Psychiatric:        Behavior: Behavior normal.        Thought Content: Thought content normal.        Judgment: Judgment normal.           Assessment & Plan:  Benign essential HTN - Plan: COMPLETE METABOLIC PANEL WITH GFR, CBC with Differential/Platelet The patient's blood pressure is well-controlled.  I will check a CBC to anemia and I will check a CMP to rule out any electrolyte disturbances.  Her last echocardiogram was excellent in September of last year.  She denies any symptoms of unstable angina or congestive heart failure.  Therefore I see no contraindication to surgery.  She has an appointment next week to see her cardiologist so I will defer an EKG to them.  However she is medically cleared to participate in the upcoming right knee replacement

## 2023-07-09 LAB — COMPLETE METABOLIC PANEL WITH GFR
AG Ratio: 1.9 (calc) (ref 1.0–2.5)
ALT: 12 U/L (ref 6–29)
AST: 17 U/L (ref 10–35)
Albumin: 4.3 g/dL (ref 3.6–5.1)
Alkaline phosphatase (APISO): 57 U/L (ref 37–153)
BUN/Creatinine Ratio: 32 (calc) — ABNORMAL HIGH (ref 6–22)
BUN: 18 mg/dL (ref 7–25)
CO2: 28 mmol/L (ref 20–32)
Calcium: 9.3 mg/dL (ref 8.6–10.4)
Chloride: 100 mmol/L (ref 98–110)
Creat: 0.56 mg/dL — ABNORMAL LOW (ref 0.60–1.00)
Globulin: 2.3 g/dL (calc) (ref 1.9–3.7)
Glucose, Bld: 102 mg/dL — ABNORMAL HIGH (ref 65–99)
Potassium: 4.6 mmol/L (ref 3.5–5.3)
Sodium: 134 mmol/L — ABNORMAL LOW (ref 135–146)
Total Bilirubin: 0.4 mg/dL (ref 0.2–1.2)
Total Protein: 6.6 g/dL (ref 6.1–8.1)
eGFR: 95 mL/min/{1.73_m2} (ref >=60)

## 2023-07-09 LAB — CBC WITH DIFFERENTIAL/PLATELET
Absolute Monocytes: 456 cells/uL (ref 200–950)
Basophils Absolute: 29 cells/uL (ref 0–200)
Basophils Relative: 0.6 %
Eosinophils Absolute: 67 cells/uL (ref 15–500)
Eosinophils Relative: 1.4 %
HCT: 37.1 % (ref 35.0–45.0)
Hemoglobin: 12.3 g/dL (ref 11.7–15.5)
Lymphs Abs: 994 cells/uL (ref 850–3900)
MCH: 31.4 pg (ref 27.0–33.0)
MCHC: 33.2 g/dL (ref 32.0–36.0)
MCV: 94.6 fL (ref 80.0–100.0)
MPV: 10 fL (ref 7.5–12.5)
Monocytes Relative: 9.5 %
Neutro Abs: 3254 cells/uL (ref 1500–7800)
Neutrophils Relative %: 67.8 %
Platelets: 157 10*3/uL (ref 140–400)
RBC: 3.92 10*6/uL (ref 3.80–5.10)
RDW: 11.8 % (ref 11.0–15.0)
Total Lymphocyte: 20.7 %
WBC: 4.8 10*3/uL (ref 3.8–10.8)

## 2023-07-11 ENCOUNTER — Other Ambulatory Visit: Payer: Self-pay | Admitting: Family Medicine

## 2023-07-13 ENCOUNTER — Encounter: Payer: Self-pay | Admitting: Family Medicine

## 2023-07-13 ENCOUNTER — Other Ambulatory Visit: Payer: Self-pay | Admitting: Family Medicine

## 2023-07-13 ENCOUNTER — Telehealth: Payer: Self-pay

## 2023-07-13 MED ORDER — MELOXICAM 15 MG PO TABS: 15.0000 mg | ORAL_TABLET | Freq: Every day | ORAL | 3 refills | Status: DC

## 2023-07-13 NOTE — Telephone Encounter (Signed)
Pt called requesting a refill on her Meloxicam. Are you okay with a refill?

## 2023-07-15 ENCOUNTER — Encounter (HOSPITAL_BASED_OUTPATIENT_CLINIC_OR_DEPARTMENT_OTHER): Payer: Self-pay

## 2023-07-15 ENCOUNTER — Ambulatory Visit (HOSPITAL_BASED_OUTPATIENT_CLINIC_OR_DEPARTMENT_OTHER): Payer: Medicare PPO | Admitting: Cardiovascular Disease

## 2023-07-15 ENCOUNTER — Encounter (HOSPITAL_BASED_OUTPATIENT_CLINIC_OR_DEPARTMENT_OTHER): Payer: Self-pay | Admitting: Cardiovascular Disease

## 2023-07-15 VITALS — BP 152/82 | HR 67 | Ht 63.0 in | Wt 167.7 lb

## 2023-07-15 DIAGNOSIS — R0609 Other forms of dyspnea: Secondary | ICD-10-CM

## 2023-07-15 DIAGNOSIS — E7800 Pure hypercholesterolemia, unspecified: Secondary | ICD-10-CM | POA: Diagnosis not present

## 2023-07-15 DIAGNOSIS — I119 Hypertensive heart disease without heart failure: Secondary | ICD-10-CM | POA: Diagnosis not present

## 2023-07-15 DIAGNOSIS — Q221 Congenital pulmonary valve stenosis: Secondary | ICD-10-CM | POA: Diagnosis not present

## 2023-07-15 MED ORDER — VALSARTAN 320 MG PO TABS: 320.0000 mg | ORAL_TABLET | Freq: Every day | ORAL | 1 refills | Status: DC

## 2023-07-15 NOTE — Patient Instructions (Signed)
Medication Instructions:  Increase Valsartan to 320 mg  *If you need a refill on your cardiac medications before your next appointment, please call your pharmacy*   Lab Work: BMP with PCP If you have labs (blood work) drawn today and your tests are completely normal, you will receive your results only by: MyChart Message (if you have MyChart) OR A paper copy in the mail If you have any lab test that is abnormal or we need to change your treatment, we will call you to review the results.   Testing/Procedures: None   Follow-Up: At [facility], you and your health needs are our priority.  As part of our continuing mission to provide you with exceptional heart care, we have created designated Provider Care Teams.  These Care Teams include your primary Cardiologist (physician) and Advanced Practice Providers (APPs -  Physician Assistants and Nurse Practitioners) who all work together to provide you with the care you need, when you need it.  We recommend signing up for the patient portal called "MyChart".  Sign up information is provided on this After Visit Summary.  MyChart is used to connect with patients for Virtual Visits (Telemedicine).  Patients are able to view lab/test results, encounter notes, upcoming appointments, etc.  Non-urgent messages can be sent to your provider as well.   To learn more about what you can do with MyChart, go to [email or web address].    Your next appointment:   1 year(s)  Provider:   Chilton Si, MD    Other Instructions F/U in one month with Pharm D

## 2023-07-15 NOTE — Progress Notes (Signed)
Cardiology Office Note:  .    Date:  07/29/2023  ID:  Heather Hoover, DOB Jan 07, 1946, MRN [id number] PCP: Donita Brooks, MD  [facility] Providers Cardiologist:  Chilton Si, MD     History of Present Illness: .    Heather Hoover is a 77 y.o. female with a history of hypertension, hyperlipidemia, and congenital pulmonic stenosis, who presents for follow up.  Heather Hoover was previously a patient of Dr. [name].  She had heart catheterizations at ages 64 and 63.  Each time she reportedly had mild pulmonic stenosis.  She saw [name] 65/2017 and reported chest heaviness after the death of her sister.  She also reported increased shortness of breath.  She had an echo 05/19/16 revealed LVEF 55-60% with mild mitral regurgitation, mild aortic regurgitation and mild pulmonary stenosis. The peak gradient is 20 mmHg.  HCTZ was reduced to 12.5mg  due to low BP.  This was in the setting of losing 50lb. She had a repeat echocardiogram 02/2019 that revealed LVEF 60 to 65% with grade 2 diastolic function.  Pulmonary pressure was mildly elevated at 9.8 mmHg.   Her BP was running low so carvedilol was reduced. Hydrochlorothiazide was also reduced due to dry mouth and eyes.  She was tested for Sjogren's and it was negative. 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 Her BP was poorly controlled in the office but she thought it had been controlled at home. She was referred to the prep program at the [facility] and she followed up with our pharmacist where Valsartan was increased with improvement in her blood pressures.  At her visit 09/2022, blood pressures were elevated in the office but averaging 120-130s/70-80s at home. She noted significant stress; she had been eating more and decreased activity. She was encouraged to work on lifestyle changes. Also complained of non-exertional dyspnea and weight gain. We ordered coronary CTA 10/2022 revealing a coronary calcium score of 0.  There was bi-atrial enlargement with severe RAE. Pulmonary arteries were dilated particularly on the left. Thickened, tri leaflet pulmonary valve with some doming in systole. She followed up with Gillian Shields, NP 01/19/2023 and they discussed her CTA results. Home blood pressures were routinely less than 130/80. She had recently received an injection in her knee and hoped to increase her physical activity. She was provided information on the Right Start program at [facility].  Today, she also presents for preoperative clearance. She will have right total knee arthroplasty performed by Dr. Linna Caprice at [facility], date TBD. Spinal anesthesia will be used. Medical clearance is requested; no medications listed as needing to be held. Her main complaint is her bilateral knee pain, more severe in her right knee. For a time she had stopped exercising due to the pain, and also gained weight with dietary indiscretions. Recently however she rejoined [employer] and has been using the recumbent bike about 3 times a week. Initially she completed 15 minutes and is now up to 20 minutes. The bike doesn't seem to exacerbate her pain as much. She states that she would be able to walk 4 blocks from a cardiovascular perspective, but she is not able to walk that long because of her right knee. She does complete a flight of stairs frequently at home. At times she complains of mild LE swelling. In the office today her blood pressure is initially 150/90, and 152/82 on manual recheck. She presents a blood pressure log which is personally reviewed. Her readings have ranged  from 108 to 155 systolic, averaging in the 130s/60s with frequent 140s. She has multiple readings in the 150s associated with increased knee pain. Blood pressures in the 100s have been rare. She denies any palpitations, chest pain, shortness of breath, lightheadedness, headaches, syncope, orthopnea, or PND.  ROS:  Please see the history of present illness. All  other systems are reviewed and negative.  (+) Bilateral knee pain, R>L (+) Mild LE swelling  Studies Reviewed: Marland Kitchen   EKG Interpretation Date/Time:  Thursday July 15 2023 08:57:03 EDT Ventricular Rate:  67 PR Interval:  128 QRS Duration:  82 QT Interval:  386 QTC Calculation: 407 R Axis:   64  Text Interpretation: Normal sinus rhythm Normal ECG No previous ECGs available Confirmed by Chilton Si ([id number]) on 07/15/2023 9:28:49 AM    Risk Assessment/Calculations:        Physical Exam:    VS:  BP (!) 152/82 (BP Location: Right Arm, Patient Position: Sitting, Cuff Size: Normal)   Pulse 67   Ht 5\' 3"  (1.6 m)   Wt 167 lb 11.2 oz (76.1 kg)   BMI 29.71 kg/m  , BMI Body mass index is 29.71 kg/m. GENERAL:  Well appearing HEENT: Pupils equal round and reactive, fundi not visualized, oral mucosa unremarkable NECK:  No jugular venous distention, waveform within normal limits, carotid upstroke brisk and symmetric, no bruits, no thyromegaly LUNGS:  Clear to auscultation bilaterally HEART:  RRR.  PMI not displaced or sustained,S1 and S2 within normal limits, no S3, no S4, no clicks, no rubs, II/VI systolic murmur at the RUSB. ABD:  Flat, positive bowel sounds normal in frequency in pitch, no bruits, no rebound, no guarding, no midline pulsatile mass, no hepatomegaly, no splenomegaly EXT:  2 plus pulses throughout, no edema, no cyanosis no clubbing SKIN:  No rashes no nodules NEURO:  Cranial nerves II through XII grossly intact, motor grossly intact throughout PSYCH:  Cognitively intact, oriented to person place and time  Wt Readings from Last 3 Encounters:  07/22/23 167 lb (75.8 kg)  07/15/23 167 lb 11.2 oz (76.1 kg)  07/08/23 151 lb (68.5 kg)     ASSESSMENT AND PLAN: .       # Hypertension: Blood pressure readings variable, with some readings in the 140s. No symptoms of hypotension reported. -Increase Valsartan from 160mg  to 320mg  daily. -Check basic metabolic panel in 1 week to  assess kidney function and potassium. -Follow up with Kristen in 1 month to reassess blood pressure control.  # Pulmonic Stenosis: Stable, with gradient across valve remaining consistent over the past 6 years. No new symptoms reported. -Continue current management. -Repeat echocardiogram in 3 years or sooner if new symptoms develop.  # Weight Gain: Patient reports weight gain, likely secondary to decreased activity due to knee pain. -Encourage continued exercise as tolerated and healthy dietary habits. -Consider referral to nutritionist if weight gain continues after knee surgery.   # Right Knee Pain: Pain and functional limitation due to knee issue, likely osteoarthritis. Patient has been trying to strengthen quads and minimize impact activities. Surgery planned with Dr. Winona Legato, date pending. -Continue current exercise regimen as tolerated. -Obtain medical clearance for surgery.  Ganglion Cyst: Noted on right pinky, currently managed by Dr. Yehuda Budd. -Continue follow up with Dr. Yehuda Budd as scheduled.        Dispo:  FU with PharmD in 1 month. FU with Ruberta Holck C. Duke Salvia, MD, [facility] in 1 year.  I,Mathew Stumpf,acting as a Neurosurgeon for [employer], MD.,have documented  all relevant documentation on the behalf of Chilton Si, MD,as directed by  Chilton Si, MD while in the presence of Chilton Si, MD.  I, Shaquoia Miers C. Duke Salvia, MD have reviewed all documentation for this visit.  The documentation of the exam, diagnosis, procedures, and orders on 07/29/2023 are all accurate and complete.   Signed, Chilton Si, MD

## 2023-07-19 DIAGNOSIS — M67449 Ganglion, unspecified hand: Secondary | ICD-10-CM | POA: Insufficient documentation

## 2023-07-20 ENCOUNTER — Other Ambulatory Visit: Payer: Medicare PPO

## 2023-07-20 DIAGNOSIS — I119 Hypertensive heart disease without heart failure: Secondary | ICD-10-CM

## 2023-07-20 DIAGNOSIS — I10 Essential (primary) hypertension: Secondary | ICD-10-CM

## 2023-07-20 DIAGNOSIS — E7800 Pure hypercholesterolemia, unspecified: Secondary | ICD-10-CM

## 2023-07-20 LAB — BASIC METABOLIC PANEL
BUN: 15 mg/dL (ref 7–25)
CO2: 28 mmol/L (ref 20–32)
Calcium: 9.5 mg/dL (ref 8.6–10.4)
Chloride: 100 mmol/L (ref 98–110)
Creat: 0.62 mg/dL (ref 0.60–1.00)
Glucose, Bld: 97 mg/dL (ref 65–99)
Potassium: 4.3 mmol/L (ref 3.5–5.3)
Sodium: 135 mmol/L (ref 135–146)

## 2023-07-21 ENCOUNTER — Telehealth: Payer: Self-pay | Admitting: Family Medicine

## 2023-07-21 DIAGNOSIS — H6121 Impacted cerumen, right ear: Secondary | ICD-10-CM | POA: Insufficient documentation

## 2023-07-21 NOTE — Progress Notes (Unsigned)
Subjective:   Heather Hoover is a 77 y.o. female who presents for Medicare Annual (Subsequent) preventive examination.  Visit Complete: {VISITMETHOD:[phone]}  Patient Medicare AWV questionnaire was completed by the patient on ***; I have confirmed that all information answered by patient is correct and no changes since this date.  Review of Systems           Objective:    There were no vitals filed for this visit. There is no height or weight on file to calculate BMI.     07/09/2022    4:03 PM 05/25/2022   11:06 AM 07/04/2021   10:38 AM 06/03/2021    7:17 AM 05/23/2021    9:00 AM 02/06/2021    9:03 AM 01/29/2021    3:12 PM  Advanced Directives  Does Patient Have a Medical Advance Directive? No Yes Yes Yes Yes No No  Type of Special educational needs teacher of [place];Living will Living will;Healthcare Power of [employer] Power of [place];Living will Healthcare Power of [place];Living will    Does patient want to make changes to medical advance directive?  No - Patient declined  No - Patient declined No - Patient declined    Copy of Healthcare Power of Attorney in Chart?  No - copy requested Yes - validated most recent copy scanned in chart (See row information) Yes - validated most recent copy scanned in chart (See row information) No - copy requested    Would patient like information on creating a medical advance directive? No - Patient declined   No - Patient declined No - Patient declined Yes (MAU/Ambulatory/Procedural Areas - Information given) Yes (MAU/Ambulatory/Procedural Areas - Information given)    Current Medications (verified) Outpatient Encounter Medications as of 07/22/2023  Medication Sig   acetaminophen (TYLENOL) 325 MG tablet Take 650 mg by mouth every 6 (six) hours as needed for mild pain or headache.   alendronate (FOSAMAX) 70 MG tablet Take 1 tablet (70 mg total) by mouth every 7 (seven) days. Take with a full glass of water on an empty  stomach.   carvedilol (COREG) 6.25 MG tablet TAKE 1 TABLET(6.25 MG) BY MOUTH TWICE DAILY WITH A MEAL   Cholecalciferol (VITAMIN D-3 PO) Take 2,000 Units by mouth daily. Taking 2000 daily   Famotidine (PEPCID PO) Take by mouth.   meloxicam (MOBIC) 15 MG tablet Take 1 tablet (15 mg total) by mouth daily.   polyvinyl alcohol (LIQUIFILM TEARS) 1.4 % ophthalmic solution Place 1 drop into both eyes as needed for dry eyes.   potassium chloride (KLOR-CON) 10 MEQ tablet TAKE 1 TABLET(10 MEQ) BY MOUTH DAILY   PREVIDENT 5000 DRY MOUTH 1.1 % GEL dental gel Place onto teeth.   rosuvastatin (CRESTOR) 20 MG tablet TAKE 1 TABLET(20 MG) BY MOUTH DAILY   valsartan (DIOVAN) 320 MG tablet Take 1 tablet (320 mg total) by mouth daily.   No facility-administered encounter medications on file as of 07/22/2023.    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 10/28/2010   echo   Osteopenia 12/2018   T score -1.6 FRAX 10% / 1.5%   Past Surgical History:  Procedure Laterality Date   ABDOMINAL HYSTERECTOMY  02/07/1981   RSO   APPENDECTOMY     BREAST SURGERY     CARDIAC CATHETERIZATION     when in 3rd grade and age 24   CARPAL TUNNEL RELEASE Left 02/06/2021   Procedure: LEFT CARPAL TUNNEL RELEASE;  Surgeon: Cindee Salt, MD;  Location: [facility];  Service: Orthopedics;  Laterality: Left;  IV REGIONAL FOREARM BLOCK 45 MINUTES   CARPAL TUNNEL RELEASE Right 06/03/2021   Procedure: RIGHT CARPAL TUNNEL RELEASE;  Surgeon: Cindee Salt, MD;  Location: [facility];  Service: Orthopedics;  Laterality: Right;  45 MIN   COSMETIC SURGERY     forehead plastic surgery     MOH's surgery    FRACTURE SURGERY     KNEE ARTHROSCOPY     right   Family History  Problem Relation Age of Onset   Hypertension Mother    Breast cancer Mother 62       Breast    Cancer Mother        uterine thye think   Cancer Father 89       prostate metastized to bone   Hypertension Father    Arthritis Father    Hypertension Sister    Breast cancer Sister 24   Cancer Sister 70       uterine   Heart failure Sister    Muscular dystrophy Brother    Social History   Socioeconomic History   Marital status: Married    Spouse name: Not on file   Number of children: Not on file   Years of education: Not on file   Highest education level: Bachelor's degree (e.g., BA, AB, BS)  Occupational History   Not on file  Tobacco Use   Smoking status: Never   Smokeless tobacco: Never  Vaping Use   Vaping status: Never Used  Substance and Sexual Activity   Alcohol use: Yes    Comment: Occas   Drug use: No   Sexual activity: Not Currently    Birth control/protection: Post-menopausal    Comment: 1st intercourse 40 yo-1 partner  Other Topics Concern   Not on file  Social History Narrative   Not on file   Social Determinants of Health   Financial Resource Strain: Low Risk  (07/01/2023)   Overall Financial Resource Strain (CARDIA)    Difficulty of Paying Living Expenses: Not hard at all  Food Insecurity: Low Risk  (07/21/2023)   Received from [facility]   Food vital sign    Within the past 12 months, you worried that your food would run out before you got money to buy more: Never true    Within the past 12 months, the food you bought just didn't last and you didn't have money to get more. : Never true  Transportation Needs: Not on file (07/21/2023)  Physical Activity: Insufficiently Active (07/01/2023)   Exercise Vital Sign    Days of Exercise per Week: 3 days    Minutes of Exercise per Session: 30 min  Stress: No Stress Concern Present (07/01/2023)   [employer] of Occupational Health -  Occupational Stress Questionnaire    Feeling of Stress : Only a little  Social Connections: Moderately Integrated (07/01/2023)   Social Connection and Isolation Panel [NHANES]    Frequency of Communication with Friends and Family: Twice a week    Frequency of Social Gatherings with Friends and Family: Twice a week    Attends Religious Services: Never    Production manager of [employer]  or Organizations: Yes    Attends Banker Meetings: More than 4 times per year    Marital Status: Married    Tobacco Counseling Counseling given: Not Answered   Clinical Intake:              How often do you need to have someone help you when you read instructions, pamphlets, or other written materials from your doctor or pharmacy?: (P) 1 - Never         Activities of Daily Living    07/18/2023    2:12 PM  In your present state of health, do you have any difficulty performing the following activities:  Hearing? 0  Vision? 0  Difficulty concentrating or making decisions? 0  Walking or climbing stairs? 1  Dressing or bathing? 0  Doing errands, shopping? 0  Preparing Food and eating ? N  Using the Toilet? N  In the past six months, have you accidently leaked urine? Y  Do you have problems with loss of bowel control? N  Managing your Medications? N  Managing your Finances? N  Housekeeping or managing your Housekeeping? N    Patient Care Team: Donita Brooks, MD as PCP - General (Family Medicine) Chilton Si, MD as PCP - Cardiology (Cardiology) Donita Brooks, MD (Family Medicine)  Indicate any recent Medical Services you may have received from other than [facility] providers in the past year (date may be approximate).     Assessment:   This is a routine wellness examination for [place].  Hearing/Vision screen No results found.  Dietary issues and exercise activities discussed:     Goals Addressed   None    Depression Screen    02/12/2023   11:04 AM  01/30/2022   10:27 AM 07/04/2021   10:30 AM 04/12/2020   10:32 AM 06/28/2018   11:07 AM 06/28/2018   10:23 AM 04/06/2017    9:38 AM  PHQ 2/9 Scores  PHQ - 2 Score 0 0 0 0 0 0 0  PHQ- 9 Score       2    Fall Risk    07/18/2023    2:12 PM 01/30/2022   10:27 AM 07/04/2021   10:30 AM 04/12/2020   10:32 AM 06/28/2018   11:07 AM  Fall Risk   Falls in the past year? 0 0 0 0 No  Number falls in past yr: 0 0 0    Injury with Fall? 1 0 0    Risk for fall due to :   No Fall Risks    Follow up   Falls evaluation completed Falls evaluation completed     MEDICARE RISK AT HOME:   TIMED UP AND GO:  Was the test performed?  No    Cognitive Function:        Immunizations Immunization History  Administered Date(s) Administered   Fluad Quad(high Dose 65+) 08/14/2019, 10/02/2020   Influenza, High Dose Seasonal PF 09/17/2017, 10/03/2018   Influenza-Unspecified 09/30/2011, 10/22/2015, 09/17/2017, 09/20/2018, 08/14/2019, 09/20/2021   PFIZER Comirnaty(Gray Top)Covid-19 Tri-Sucrose Vaccine 03/22/2021   PFIZER(Purple Top)SARS-COV-2 Vaccination 01/27/2020, 02/20/2020, 10/21/2020, 08/25/2022   Pneumococcal Conjugate-13 05/21/2014   Pneumococcal Polysaccharide-23 04/06/2017   Td 06/06/2012   Tdap 06/06/2012, 02/11/2019   Zoster Recombinant(Shingrix) 07/28/2021, 11/23/2022   Zoster, Live 10/09/2013    TDAP status: Up to date  Pneumococcal vaccine status: Up to date  Covid-19 vaccine status: Information provided on how to obtain vaccines.   Qualifies for Shingles Vaccine? Yes   Zostavax  completed Yes   Shingrix Completed?: Yes  Screening Tests Health Maintenance  Topic Date Due   COVID-19 Vaccine (6 - 2023-24 season) 10/20/2022   Medicare Annual Wellness (AWV)  07/18/2023   INFLUENZA VACCINE  07/22/2023   DTaP/Tdap/Td (4 - Td or Tdap) 02/11/2029   Pneumonia Vaccine 70+ Years old  Completed   DEXA SCAN  Completed   Hepatitis C Screening  Completed   Zoster Vaccines- Shingrix  Completed    HPV VACCINES  Aged Out   Colonoscopy  Discontinued    Health Maintenance  Health Maintenance Due  Topic Date Due   COVID-19 Vaccine (6 - 2023-24 season) 10/20/2022   Medicare Annual Wellness (AWV)  07/18/2023    Colorectal cancer screening: No longer required.   Mammogram status: Completed 12/07/22. Repeat every year  Bone Density status: Completed 01/29/23. Results reflect: Bone density results: OSTEOPENIA. Repeat every 2 years.  Lung Cancer Screening: (Low Dose CT Chest recommended if Age 79-80 years, 20 pack-year currently smoking OR have quit w/in 15years.) does not qualify.   Lung Cancer Screening Referral: n/a  Additional Screening:  Hepatitis C Screening: does qualify; Completed 04/13/17  Vision Screening: Recommended annual ophthalmology exams for early detection of glaucoma and other disorders of the eye. Is the patient up to date with their annual eye exam?  {YES/NO:21197} Who is the provider or what is the name of the office in which the patient attends annual eye exams? *** If pt is not established with a provider, would they like to be referred to a provider to establish care? {YES/NO:21197}.   Dental Screening: Recommended annual dental exams for proper oral hygiene  Community Resource Referral / Chronic Care Management: CRR required this visit?  {YES/NO:21197}  CCM required this visit?  {CCM Required choices:[phone]}     Plan:     I have personally reviewed and noted the following in the patient's chart:   Medical and social history Use of alcohol, tobacco or illicit drugs  Current medications and supplements including opioid prescriptions. {Opioid [phone]} Functional ability and status Nutritional status Physical activity Advanced directives List of other physicians Hospitalizations, surgeries, and ER visits in previous 12 months Vitals Screenings to include cognitive, depression, and falls Referrals and appointments  In  addition, I have reviewed and discussed with patient certain preventive protocols, quality metrics, and best practice recommendations. A written personalized care plan for preventive services as well as general preventive health recommendations were provided to patient.     Kandis Fantasia [place], [place]   1/61/0960   After Visit Summary: {CHL AMB AWV After Visit [phone]}  Nurse Notes: ***

## 2023-07-21 NOTE — Patient Instructions (Incomplete)
Heather Hoover , Thank you for taking time to come for your Medicare Wellness Visit. I appreciate your ongoing commitment to your health goals. Please review the following plan we discussed and let me know if I can assist you in the future.   Referrals/Orders/Follow-Ups/Clinician Recommendations: Aim for 30 minutes of exercise or brisk walking, 6-8 glasses of water, and 5 servings of fruits and vegetables each day.  This is a list of the screening recommended for you and due dates:  Health Maintenance  Topic Date Due   COVID-19 Vaccine (6 - 2023-24 season) 10/20/2022   Medicare Annual Wellness Visit  07/18/2023   Flu Shot  07/22/2023   DTaP/Tdap/Td vaccine (4 - Td or Tdap) 02/11/2029   Pneumonia Vaccine  Completed   DEXA scan (bone density measurement)  Completed   Hepatitis C Screening  Completed   Zoster (Shingles) Vaccine  Completed   HPV Vaccine  Aged Out   Colon Cancer Screening  Discontinued    Advanced directives: (ACP Link)Information on Advanced Care Planning can be found at [facility] Advance Health Care Directives Advance Health Care Directives ([email or web address]   Next Medicare Annual Wellness Visit scheduled for next year: Yes  Preventive Care 65 Years and Older, Female Preventive care refers to lifestyle choices and visits with your health care provider that can promote health and wellness. What does preventive care include? A yearly physical exam. This is also called an annual well check. Dental exams once or twice a year. Routine eye exams. Ask your health care provider how often you should have your eyes checked. Personal lifestyle choices, including: Daily care of your teeth and gums. Regular physical activity. Eating a healthy diet. Avoiding tobacco and drug use. Limiting alcohol use. Practicing safe sex. Taking low-dose aspirin every day. Taking vitamin and mineral supplements as recommended by your health care provider. What happens during an  annual well check? The services and screenings done by your health care provider during your annual well check will depend on your age, overall health, lifestyle risk factors, and family history of disease. Counseling  Your health care provider may ask you questions about your: Alcohol use. Tobacco use. Drug use. Emotional well-being. Home and relationship well-being. Sexual activity. Eating habits. History of falls. Memory and ability to understand (cognition). Work and work Astronomer. Reproductive health. Screening  You may have the following tests or measurements: Height, weight, and BMI. Blood pressure. Lipid and cholesterol levels. These may be checked every 5 years, or more frequently if you are over 73 years old. Skin check. Lung cancer screening. You may have this screening every year starting at age 71 if you have a 30-pack-year history of smoking and currently smoke or have quit within the past 15 years. Fecal occult blood test (FOBT) of the stool. You may have this test every year starting at age 30. Flexible sigmoidoscopy or colonoscopy. You may have a sigmoidoscopy every 5 years or a colonoscopy every 10 years starting at age 37. Hepatitis C blood test. Hepatitis B blood test. Sexually transmitted disease (STD) testing. Diabetes screening. This is done by checking your blood sugar (glucose) after you have not eaten for a while (fasting). You may have this done every 1-3 years. Bone density scan. This is done to screen for osteoporosis. You may have this done starting at age 26. Mammogram. This may be done every 1-2 years. Talk to your health care provider about how often you should have regular mammograms. Talk with your health  care provider about your test results, treatment options, and if necessary, the need for more tests. Vaccines  Your health care provider may recommend certain vaccines, such as: Influenza vaccine. This is recommended every year. Tetanus,  diphtheria, and acellular pertussis (Tdap, Td) vaccine. You may need a Td booster every 10 years. Zoster vaccine. You may need this after age 2. Pneumococcal 13-valent conjugate (PCV13) vaccine. One dose is recommended after age 80. Pneumococcal polysaccharide (PPSV23) vaccine. One dose is recommended after age 33. Talk to your health care provider about which screenings and vaccines you need and how often you need them. This information is not intended to replace advice given to you by your health care provider. Make sure you discuss any questions you have with your health care provider. Document Released: 01/03/2016 Document Revised: 08/26/2016 Document Reviewed: 10/08/2015 Elsevier Interactive Patient Education  2017 [employer].  Fall Prevention in the Home Falls can cause injuries. They can happen to people of all ages. There are many things you can do to make your home safe and to help prevent falls. What can I do on the outside of my home? Regularly fix the edges of walkways and driveways and fix any cracks. Remove anything that might make you trip as you walk through a door, such as a raised step or threshold. Trim any bushes or trees on the path to your home. Use bright outdoor lighting. Clear any walking paths of anything that might make someone trip, such as rocks or tools. Regularly check to see if handrails are loose or broken. Make sure that both sides of any steps have handrails. Any raised decks and porches should have guardrails on the edges. Have any leaves, snow, or ice cleared regularly. Use sand or salt on walking paths during winter. Clean up any spills in your garage right away. This includes oil or grease spills. What can I do in the bathroom? Use night lights. Install grab bars by the toilet and in the tub and shower. Do not use towel bars as grab bars. Use non-skid mats or decals in the tub or shower. If you need to sit down in the shower, use a plastic,  non-slip stool. Keep the floor dry. Clean up any water that spills on the floor as soon as it happens. Remove soap buildup in the tub or shower regularly. Attach bath mats securely with double-sided non-slip rug tape. Do not have throw rugs and other things on the floor that can make you trip. What can I do in the bedroom? Use night lights. Make sure that you have a light by your bed that is easy to reach. Do not use any sheets or blankets that are too big for your bed. They should not hang down onto the floor. Have a firm chair that has side arms. You can use this for support while you get dressed. Do not have throw rugs and other things on the floor that can make you trip. What can I do in the kitchen? Clean up any spills right away. Avoid walking on wet floors. Keep items that you use a lot in easy-to-reach places. If you need to reach something above you, use a strong step stool that has a grab bar. Keep electrical cords out of the way. Do not use floor polish or wax that makes floors slippery. If you must use wax, use non-skid floor wax. Do not have throw rugs and other things on the floor that can make you trip. What can  I do with my stairs? Do not leave any items on the stairs. Make sure that there are handrails on both sides of the stairs and use them. Fix handrails that are broken or loose. Make sure that handrails are as long as the stairways. Check any carpeting to make sure that it is firmly attached to the stairs. Fix any carpet that is loose or worn. Avoid having throw rugs at the top or bottom of the stairs. If you do have throw rugs, attach them to the floor with carpet tape. Make sure that you have a light switch at the top of the stairs and the bottom of the stairs. If you do not have them, ask someone to add them for you. What else can I do to help prevent falls? Wear shoes that: Do not have high heels. Have rubber bottoms. Are comfortable and fit you well. Are closed  at the toe. Do not wear sandals. If you use a stepladder: Make sure that it is fully opened. Do not climb a closed stepladder. Make sure that both sides of the stepladder are locked into place. Ask someone to hold it for you, if possible. Clearly mark and make sure that you can see: Any grab bars or handrails. First and last steps. Where the edge of each step is. Use tools that help you move around (mobility aids) if they are needed. These include: Canes. Walkers. Scooters. Crutches. Turn on the lights when you go into a dark area. Replace any light bulbs as soon as they burn out. Set up your furniture so you have a clear path. Avoid moving your furniture around. If any of your floors are uneven, fix them. If there are any pets around you, be aware of where they are. Review your medicines with your doctor. Some medicines can make you feel dizzy. This can increase your chance of falling. Ask your doctor what other things that you can do to help prevent falls. This information is not intended to replace advice given to you by your health care provider. Make sure you discuss any questions you have with your health care provider. Document Released: 10/03/2009 Document Revised: 05/14/2016 Document Reviewed: 01/11/2015 Elsevier Interactive Patient Education  2017 [employer].

## 2023-07-21 NOTE — Telephone Encounter (Signed)
Received call from [facility] with [facility] to follow up on patient's appointment with them today. Provider is requesting a referral dated for today's date for a hearing test with audiology. Please fax to [phone], attn: LaTasha.  Please advise LaTasha with questions at [phone].

## 2023-07-22 ENCOUNTER — Other Ambulatory Visit: Payer: Self-pay

## 2023-07-22 ENCOUNTER — Ambulatory Visit (INDEPENDENT_AMBULATORY_CARE_PROVIDER_SITE_OTHER): Payer: Medicare PPO

## 2023-07-22 VITALS — Ht 63.0 in | Wt 167.0 lb

## 2023-07-22 DIAGNOSIS — H9193 Unspecified hearing loss, bilateral: Secondary | ICD-10-CM

## 2023-07-22 DIAGNOSIS — Z0000 Encounter for general adult medical examination without abnormal findings: Secondary | ICD-10-CM

## 2023-07-29 ENCOUNTER — Encounter: Payer: Medicare PPO | Admitting: Family Medicine

## 2023-07-29 ENCOUNTER — Encounter (HOSPITAL_BASED_OUTPATIENT_CLINIC_OR_DEPARTMENT_OTHER): Payer: Self-pay | Admitting: Cardiovascular Disease

## 2023-07-29 DIAGNOSIS — M25561 Pain in right knee: Secondary | ICD-10-CM | POA: Insufficient documentation

## 2023-07-29 DIAGNOSIS — M25661 Stiffness of right knee, not elsewhere classified: Secondary | ICD-10-CM | POA: Insufficient documentation

## 2023-08-02 ENCOUNTER — Other Ambulatory Visit: Payer: Self-pay | Admitting: Cardiovascular Disease

## 2023-08-05 ENCOUNTER — Ambulatory Visit (INDEPENDENT_AMBULATORY_CARE_PROVIDER_SITE_OTHER): Payer: Medicare PPO | Admitting: Family Medicine

## 2023-08-05 ENCOUNTER — Encounter: Payer: Self-pay | Admitting: Family Medicine

## 2023-08-05 VITALS — BP 126/72 | HR 63 | Temp 98.5°F | Ht 63.0 in | Wt 167.0 lb

## 2023-08-05 DIAGNOSIS — Z0001 Encounter for general adult medical examination with abnormal findings: Secondary | ICD-10-CM | POA: Diagnosis not present

## 2023-08-05 DIAGNOSIS — I10 Essential (primary) hypertension: Secondary | ICD-10-CM | POA: Diagnosis not present

## 2023-08-05 DIAGNOSIS — Z0000 Encounter for general adult medical examination without abnormal findings: Secondary | ICD-10-CM

## 2023-08-05 NOTE — Progress Notes (Signed)
Subjective:    Patient ID: Heather Hoover, female    DOB: June 20, 1946, 77 y.o.   MRN: [id number]  HPI Patient is a very pleasant 77 year old Caucasian female here today for complete physical exam.    Her most recent lab work is shown below and is outstanding.  She is scheduled for a knee replacement in September.  Her immunizations are up-to-date except for the flu shot in October.  We discussed RSV vaccine but at the present time she defers this.  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  She is now taking Fosamax.  She is also on vitamin D.  She cannot tolerate calcium due to constipation.  Mammogram is due in December.  Colonoscopy was performed in 2021 and is up-to-date. Lab on 07/20/2023  Component Date Value Ref Range Status   Cholesterol 07/20/2023 152  <200 mg/dL Final   HDL 10/17/2535 75  > OR = 50 mg/dL Final   Triglycerides 64/40/3474 95  <150 mg/dL Final   LDL Cholesterol (Calc) 07/20/2023 59  mg/dL (calc) Final   Comment: Reference range: <100 . Desirable range <100 mg/dL for primary prevention;   <70 mg/dL for patients with CHD or diabetic patients  with > or = 2 CHD risk factors. Marland Kitchen LDL-C is now calculated using the Martin-Hopkins  calculation, which is a validated novel method providing  better accuracy than the Friedewald equation in the  estimation of LDL-C.  Horald Pollen et al. Lenox Ahr. 2595;638(75): 2061-2068  (http://education.[facility].com/faq/FAQ164)    Total CHOL/HDL Ratio 07/20/2023 2.0  <6.4 (calc) Final   Non-HDL Cholesterol (Calc) 07/20/2023 77  <130 mg/dL (calc) Final   Comment: For patients with diabetes plus 1 major ASCVD risk  factor, treating to a non-HDL-C goal of <100 mg/dL  (LDL-C of <33 mg/dL) is considered a therapeutic  option.    Glucose, Bld 07/20/2023 97  65 - 99 mg/dL Final   Comment: .            Fasting reference interval .    BUN 07/20/2023 15  7 - 25 mg/dL Final    Creat 29/51/8841 0.62  0.60 - 1.00 mg/dL Final   BUN/Creatinine Ratio 07/20/2023 SEE NOTE:  6 - 22 (calc) Final   Comment:    Not Reported: BUN and Creatinine are within    reference range. .    Sodium 07/20/2023 135  135 - 146 mmol/L Final   Potassium 07/20/2023 4.3  3.5 - 5.3 mmol/L Final   Chloride 07/20/2023 100  98 - 110 mmol/L Final   CO2 07/20/2023 28  20 - 32 mmol/L Final   Calcium 07/20/2023 9.5  8.6 - 10.4 mg/dL Final  Office Visit on 07/08/2023  Component Date Value Ref Range Status   Glucose, Bld 07/08/2023 102 (H)  65 - 99 mg/dL Final   Comment: .            Fasting reference interval . For someone without known diabetes, a glucose value between 100 and 125 mg/dL is consistent with prediabetes and should be confirmed with a follow-up test. .    BUN 07/08/2023 18  7 - 25 mg/dL Final   Creat 66/05/3015 0.56 (L)  0.60 - 1.00 mg/dL Final   eGFR 12/29/3233 95  > OR = 60 mL/min/1.57m2 Final   BUN/Creatinine Ratio 07/08/2023 32 (H)  6 - 22 (calc) Final   Sodium 07/08/2023 134 (L)  135 - 146 mmol/L Final  Potassium 07/08/2023 4.6  3.5 - 5.3 mmol/L Final   Chloride 07/08/2023 100  98 - 110 mmol/L Final   CO2 07/08/2023 28  20 - 32 mmol/L Final   Calcium 07/08/2023 9.3  8.6 - 10.4 mg/dL Final   Total Protein 78/46/9629 6.6  6.1 - 8.1 g/dL Final   Albumin 52/84/1324 4.3  3.6 - 5.1 g/dL Final   Globulin 40/09/2724 2.3  1.9 - 3.7 g/dL (calc) Final   AG Ratio 07/08/2023 1.9  1.0 - 2.5 (calc) Final   Total Bilirubin 07/08/2023 0.4  0.2 - 1.2 mg/dL Final   Alkaline phosphatase (APISO) 07/08/2023 57  37 - 153 U/L Final   AST 07/08/2023 17  10 - 35 U/L Final   ALT 07/08/2023 12  6 - 29 U/L Final   WBC 07/08/2023 4.8  3.8 - 10.8 Thousand/uL Final   RBC 07/08/2023 3.92  3.80 - 5.10 Million/uL Final   Hemoglobin 07/08/2023 12.3  11.7 - 15.5 g/dL Final   HCT 36/64/4034 37.1  35.0 - 45.0 % Final   MCV 07/08/2023 94.6  80.0 - 100.0 fL Final   MCH 07/08/2023 31.4  27.0 - 33.0 pg  Final   MCHC 07/08/2023 33.2  32.0 - 36.0 g/dL Final   RDW 74/25/9563 11.8  11.0 - 15.0 % Final   Platelets 07/08/2023 157  140 - 400 Thousand/uL Final   MPV 07/08/2023 10.0  7.5 - 12.5 fL Final   Neutro Abs 07/08/2023 3,254  1,500 - 7,800 cells/uL Final   Lymphs Abs 07/08/2023 994  850 - 3,900 cells/uL Final   Absolute Monocytes 07/08/2023 456  200 - 950 cells/uL Final   Eosinophils Absolute 07/08/2023 67  15 - 500 cells/uL Final   Basophils Absolute 07/08/2023 29  0 - 200 cells/uL Final   Neutrophils Relative % 07/08/2023 67.8  % Final   Total Lymphocyte 07/08/2023 20.7  % Final   Monocytes Relative 07/08/2023 9.5  % Final   Eosinophils Relative 07/08/2023 1.4  % Final   Basophils Relative 07/08/2023 0.6  % Final   She does complain of some right knee pain and is requesting a cortisone shot in her right knee. Immunization History  Administered Date(s) Administered   Fluad Quad(high Dose 65+) 08/14/2019, 10/02/2020   Influenza, High Dose Seasonal PF 09/17/2017, 10/03/2018   Influenza-Unspecified 09/30/2011, 10/22/2015, 09/17/2017, 09/20/2018, 08/14/2019, 09/20/2021   PFIZER Comirnaty(Gray Top)Covid-19 Tri-Sucrose Vaccine 03/22/2021   PFIZER(Purple Top)SARS-COV-2 Vaccination 01/27/2020, 02/20/2020, 10/21/2020, 08/25/2022   Pneumococcal Conjugate-13 05/21/2014   Pneumococcal Polysaccharide-23 04/06/2017   Td 06/06/2012   Tdap 06/06/2012, 02/11/2019   Zoster Recombinant(Shingrix) 07/28/2021, 11/23/2022   Zoster, Live 10/09/2013    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 10/28/2010   echo   Osteopenia 12/2018   T score -1.6 FRAX 10% / 1.5%   Past Surgical History:  Procedure Laterality Date   ABDOMINAL HYSTERECTOMY  02/07/1981   RSO   APPENDECTOMY     BREAST SURGERY     CARDIAC  CATHETERIZATION     when in 3rd grade and age 77   CARPAL TUNNEL RELEASE Left 02/06/2021   Procedure: LEFT CARPAL TUNNEL RELEASE;  Surgeon: Cindee Salt, MD;  Location: [facility];  Service: Orthopedics;  Laterality: Left;  IV REGIONAL  FOREARM BLOCK 45 MINUTES   CARPAL TUNNEL RELEASE Right 06/03/2021   Procedure: RIGHT CARPAL TUNNEL RELEASE;  Surgeon: Cindee Salt, MD;  Location: [facility];  Service: Orthopedics;  Laterality: Right;  45 MIN   COSMETIC SURGERY     forehead plastic surgery     MOH's surgery   FRACTURE SURGERY     KNEE ARTHROSCOPY     right   Current Outpatient Medications on File Prior to Visit  Medication Sig Dispense Refill   acetaminophen (TYLENOL) 325 MG tablet Take 650 mg by mouth every 6 (six) hours as needed for mild pain or headache.     alendronate (FOSAMAX) 70 MG tablet Take 1 tablet (70 mg total) by mouth every 7 (seven) days. Take with a full glass of water on an empty stomach. 4 tablet 11   carvedilol (COREG) 6.25 MG tablet TAKE 1 TABLET(6.25 MG) BY MOUTH TWICE DAILY WITH A MEAL 180 tablet 1   Cholecalciferol (VITAMIN D-3 PO) Take 2,000 Units by mouth daily. Taking 2000 daily     Famotidine (PEPCID PO) Take by mouth.     meloxicam (MOBIC) 15 MG tablet Take 1 tablet (15 mg total) by mouth daily. 30 tablet 3   polyvinyl alcohol (LIQUIFILM TEARS) 1.4 % ophthalmic solution Place 1 drop into both eyes as needed for dry eyes.     potassium chloride (KLOR-CON) 10 MEQ tablet TAKE 1 TABLET(10 MEQ) BY MOUTH DAILY 90 tablet 1   PREVIDENT 5000 DRY MOUTH 1.1 % GEL dental gel Place onto teeth.     rosuvastatin (CRESTOR) 20 MG tablet TAKE 1 TABLET(20 MG) BY MOUTH DAILY 90 tablet 3   valsartan (DIOVAN) 320 MG tablet Take 1 tablet (320 mg total) by mouth daily. 90 tablet 1   No current facility-administered medications on file prior to visit.   Allergies  Allergen Reactions   Augmentin [Amoxicillin-Pot Clavulanate] Diarrhea    Did it involve  swelling of the face/tongue/throat, SOB, or low BP? yes Did it involve sudden or severe rash/hives, skin peeling, or any reaction on the inside of your mouth or nose? no Did you need to seek medical attention at a hospital or doctor's office? no When did it last happen?      2000 If all above answers are "NO", may proceed with cephalosporin use.    Niacin-Lovastatin Er     Flushing,itching,syncope   Kenalog [Triamcinolone] Palpitations   Social History   Socioeconomic History   Marital status: Married    Spouse name: Not on file   Number of children: Not on file   Years of education: Not on file   Highest education level: Bachelor's degree (e.g., BA, AB, BS)  Occupational History   Not on file  Tobacco Use   Smoking status: Never   Smokeless tobacco: Never  Vaping Use   Vaping status: Never Used  Substance and Sexual Activity   Alcohol use: Yes    Comment: Occas   Drug use: No   Sexual activity: Not Currently    Birth control/protection: Post-menopausal    Comment: 1st intercourse 23 yo-1 partner  Other Topics Concern   Not on file  Social History Narrative   Not on file   Social Determinants of Health   Financial Resource Strain: Low Risk  (07/18/2023)   Overall Financial Resource Strain (CARDIA)    Difficulty of Paying Living Expenses: Not hard at all  Food Insecurity: Low Risk  (07/21/2023)   Received from [facility]  Food vital sign    Within the past 12 months, you worried that your food would run out before you got money to buy more: Never true    Within the past 12 months, the food you bought just didn't last and you didn't have money to get more. : Never true  Transportation Needs: Not on file (07/21/2023)  Physical Activity: Insufficiently Active (07/18/2023)   Exercise Vital Sign    Days of Exercise per Week: 3 days    Minutes of Exercise per Session: 20 min  Stress: No Stress Concern Present (07/18/2023)   [employer] of Occupational Health -  Occupational Stress Questionnaire    Feeling of Stress : Only a little  Social Connections: Moderately Integrated (07/18/2023)   Social Connection and Isolation Panel [NHANES]    Frequency of Communication with Friends and Family: Once a week    Frequency of Social Gatherings with Friends and Family: Twice a week    Attends Religious Services: Never    Database administrator or Organizations: Yes    Attends Engineer, structural: More than 4 times per year    Marital Status: Married  Catering manager Violence: Not At Risk (07/22/2023)   Humiliation, Afraid, Rape, and Kick questionnaire    Fear of Current or Ex-Partner: No    Emotionally Abused: No    Physically Abused: No    Sexually Abused: No   Family History  Problem Relation Age of Onset   Hypertension Mother    Breast cancer Mother 88       Breast    Cancer Mother        uterine thye think   Cancer Father 49       prostate metastized to bone   Hypertension Father    Arthritis Father    Hypertension Sister    Breast cancer Sister 51   Cancer Sister 57       uterine   Heart failure Sister    Muscular dystrophy Brother       Review of Systems  All other systems reviewed and are negative.      Objective:   Physical Exam Vitals reviewed.  Constitutional:      General: She is not in acute distress.    Appearance: She is well-developed. She is not diaphoretic.  HENT:     Head: Normocephalic and atraumatic.     Right Ear: External ear normal.     Left Ear: External ear normal.     Nose: Nose normal.     Mouth/Throat:     Pharynx: No oropharyngeal exudate.  Eyes:     General: No scleral icterus.       Right eye: No discharge.        Left eye: No discharge.     Conjunctiva/sclera: Conjunctivae normal.     Pupils: Pupils are equal, round, and reactive to light.  Neck:     Thyroid: No thyromegaly.     Vascular: No JVD.     Trachea: No tracheal deviation.  Cardiovascular:     Rate and Rhythm: Normal  rate and regular rhythm.     Heart sounds: Murmur heard.     No friction rub. No gallop.  Pulmonary:     Effort: Pulmonary effort is normal. No respiratory distress.     Breath sounds: Normal breath sounds. No stridor. No wheezing or rales.  Chest:     Chest wall: No tenderness.  Abdominal:     General: Bowel  sounds are normal. There is no distension.     Palpations: Abdomen is soft. There is no mass.     Tenderness: There is no abdominal tenderness. There is no guarding or rebound.  Musculoskeletal:        General: No tenderness. Normal range of motion.     Cervical back: Normal range of motion and neck supple.  Lymphadenopathy:     Cervical: No cervical adenopathy.  Skin:    General: Skin is warm.     Coloration: Skin is not pale.     Findings: No erythema or rash.  Neurological:     Mental Status: She is alert and oriented to person, place, and time.     Cranial Nerves: No cranial nerve deficit.     Motor: No abnormal muscle tone.     Coordination: Coordination normal.     Deep Tendon Reflexes: Reflexes are normal and symmetric.  Psychiatric:        Behavior: Behavior normal.        Thought Content: Thought content normal.        Judgment: Judgment normal.           Assessment & Plan:  Encounter for Medicare annual wellness exam  Benign essential HTN  General medical exam Patient's blood pressure is outstanding.  Her lab work is excellent.  Immunizations are up-to-date.  Bone density is next due in 2027.  Mammogram is due in December.  Colonoscopy is up-to-date.  I wished her the best of luck on her knee replacement.  Also recommended trying to find calcium that she can take if possible to help osteoporosis

## 2023-08-12 NOTE — Progress Notes (Signed)
Sent message, via epic in basket, requesting orders in epic from surgeon.  

## 2023-08-13 ENCOUNTER — Ambulatory Visit: Payer: Self-pay | Admitting: Student

## 2023-08-13 ENCOUNTER — Other Ambulatory Visit: Payer: Self-pay | Admitting: Family Medicine

## 2023-08-13 MED ORDER — MELOXICAM 15 MG PO TABS: 15.0000 mg | ORAL_TABLET | Freq: Every day | ORAL | 3 refills | Status: DC

## 2023-08-16 ENCOUNTER — Ambulatory Visit: Payer: Medicare PPO | Admitting: Pharmacist Clinician (PhC)/ Clinical Pharmacy Specialist

## 2023-08-16 ENCOUNTER — Encounter: Payer: Self-pay | Admitting: Pharmacist Clinician (PhC)/ Clinical Pharmacy Specialist

## 2023-08-16 VITALS — BP 154/78 | HR 62

## 2023-08-16 DIAGNOSIS — I119 Hypertensive heart disease without heart failure: Secondary | ICD-10-CM

## 2023-08-16 NOTE — Patient Instructions (Signed)
Follow up appointment: Monday October 21 at 10:30 am  Take your BP meds as follows:  Stop potassium  Continue all other medications  Check your blood pressure at home daily (if able) and keep record of the readings.  Hypertension "High blood pressure"  Hypertension is often called "The Silent Killer." It rarely causes symptoms until it is extremely  high or has done damage to other organs in the body. For this reason, you should have your  blood pressure checked regularly by your physician. We will check your blood pressure  every time you see a provider at one of our offices.   Your blood pressure reading consists of two numbers. Ideally, blood pressure should be  below 120/80. The first ("top") number is called the systolic pressure. It measures the  pressure in your arteries as your heart beats. The second ("bottom") number is called the diastolic pressure. It measures the pressure in your arteries as the heart relaxes between beats.  The benefits of getting your blood pressure under control are enormous. A 10-point  reduction in systolic blood pressure can reduce your risk of stroke by 27% and heart failure by 28%  Your blood pressure goal is <130/80  To check your pressure at home you will need to:  1. Sit up in a chair, with feet flat on the floor and back supported. Do not cross your ankles or legs. 2. Rest your left arm so that the cuff is about heart level. If the cuff goes on your upper arm,  then just relax the arm on the table, arm of the chair or your lap. If you have a wrist cuff, we  suggest relaxing your wrist against your chest (think of it as Pledging the Flag with the  wrong arm).  3. Place the cuff snugly around your arm, about 1 inch above the crook of your elbow. The  cords should be inside the groove of your elbow.  4. Sit quietly, with the cuff in place, for about 5 minutes. After that 5 minutes press the power  button to start a reading. 5. Do not talk  or move while the reading is taking place.  6. Record your readings on a sheet of paper. Although most cuffs have a memory, it is often  easier to see a pattern developing when the numbers are all in front of you.  7. You can repeat the reading after 1-3 minutes if it is recommended  Make sure your bladder is empty and you have not had caffeine or tobacco within the last 30 min  Always bring your blood pressure log with you to your appointments. If you have not brought your monitor in to be double checked for accuracy, please bring it to your next appointment.  You can find a list of quality blood pressure cuffs at validatebp.org

## 2023-08-16 NOTE — Progress Notes (Signed)
Office Visit    Patient Name: Heather Hoover Date of Encounter: 08/16/2023  Primary Care Provider:  Donita Brooks, MD Primary Cardiologist:  Chilton Si, MD  Chief Complaint    Hypertension - Advanced hypertension clinic  Past Medical History   HLD 7/24 LDL 59 on rosuvastatin 20   Congenital pulmonic stenosis Stable, consistent gradient over past 6 years    Allergies  Allergen Reactions   Augmentin [Amoxicillin-Pot Clavulanate] Diarrhea    Did it involve swelling of the face/tongue/throat, SOB, or low BP? yes Did it involve sudden or severe rash/hives, skin peeling, or any reaction on the inside of your mouth or nose? no Did you need to seek medical attention at a hospital or doctor's office? no When did it last happen?      2000 If all above answers are "NO", may proceed with cephalosporin use.    Niacin-Lovastatin Er     Flushing,itching,syncope   Kenalog [Triamcinolone] Palpitations    History of Present Illness    Heather Hoover is a 77 y.o. female patient of Dr. Duke Salvia, in the office today for hypertension follow up.  She has been seen multiple times in clinic, often with elevated readings, but noting home readings WNL.  Most recently she was seen in July, and has been somewhat limited in movement due to upcoming TKA, scheduled for next month.    Today she is in the office for follow up.  She is having the knee surgery on Sept 11 and today notes her pain level is about 6.  She is using meloxicam daily, as well as acetaminophen.  She will also use SalonPas and diclofenac cream topically on bad days.     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  Home BP readings:  10 AM readings average 132/67  HR 64  (range 117-148/WNL    13 PM readings average 137/69  HR 61  (range 119-152/60-90)   Accessory Clinical Findings    Lab Results  Component Value Date   CREATININE 0.62 07/20/2023   BUN 15 07/20/2023   NA 135 07/20/2023   K 4.3 07/20/2023   CL 100 07/20/2023   CO2 28 07/20/2023   Lab Results  Component Value Date   ALT 12 07/08/2023   AST 17 07/08/2023   ALKPHOS 58 02/22/2019   BILITOT 0.4 07/08/2023   No results found for: "HGBA1C"  Screening for Secondary Hypertension:      Relevant Labs/Studies:    Latest Ref Rng & Units 07/20/2023    8:16 AM 07/08/2023   12:49 PM 10/23/2022   10:17 AM  Basic Labs  Sodium 135 - 146 mmol/L 135  134  137   Potassium 3.5 - 5.3 mmol/L 4.3  4.6  4.5   Creatinine 0.60 - 1.00 mg/dL 3.76  2.83  1.51        Latest Ref Rng & Units 03/12/2016  11:00 AM 08/13/2014    3:32 PM  Thyroid   TSH mIU/L 2.51  2.512                  Home Medications    Current Outpatient Medications  Medication Sig Dispense Refill   acetaminophen (TYLENOL) 325 MG tablet Take 650 mg by mouth every 6 (six) hours as needed (pain.).     alendronate (FOSAMAX) 70 MG tablet Take 1 tablet (70 mg total) by mouth every 7 (seven) days. Take with a full glass of water on an empty stomach. (Patient taking differently: Take 70 mg by mouth every Thursday. Take with a full glass of water on an empty stomach.) 4 tablet 11   bismuth subsalicylate (PEPTO BISMOL) 262 MG chewable tablet Chew 262-524 mg by mouth 4 (four) times daily as needed for diarrhea or loose stools or indigestion.     bismuth subsalicylate (PEPTO BISMOL) 262 MG/15ML suspension Take 30 mLs by mouth  every 6 (six) hours as needed for diarrhea or loose stools or indigestion.     carboxymethylcellulose (REFRESH PLUS) 0.5 % SOLN Place 1 drop into both eyes 3 (three) times daily as needed (dry/irritated eyes.).     carvedilol (COREG) 6.25 MG tablet TAKE 1 TABLET(6.25 MG) BY MOUTH TWICE DAILY WITH A MEAL 180 tablet 1   Cholecalciferol (VITAMIN D-3 PO) Take 2,000 Units by mouth in the morning.     clotrimazole (LOTRIMIN) 1 % external solution Apply 1 Application topically 2 (two) times daily as needed (toe nail fungus).     diclofenac Sodium (VOLTAREN) 1 % GEL Apply 1 Application topically 4 (four) times daily as needed (pain).     Famotidine (PEPCID PO) Take 10 mg by mouth in the morning.     meloxicam (MOBIC) 15 MG tablet Take 1 tablet (15 mg total) by mouth daily. 90 tablet 3   Menthol-Methyl Salicylate (SALONPAS PAIN RELIEF PATCH) 3-10 % PTCH Apply topically.     potassium chloride (KLOR-CON) 10 MEQ tablet TAKE 1 TABLET(10 MEQ) BY MOUTH DAILY 90 tablet 1   PREVIDENT 5000 DRY MOUTH 1.1 % GEL dental gel Place 1 Application onto teeth in the morning.     rosuvastatin (CRESTOR) 20 MG tablet TAKE 1 TABLET(20 MG) BY MOUTH DAILY (Patient taking differently: Take 20 mg by mouth at bedtime.) 90 tablet 3   valsartan (DIOVAN) 320 MG tablet Take 1 tablet (320 mg total) by mouth daily. (Patient taking differently: Take 320 mg by mouth at bedtime.) 90 tablet 1   No current facility-administered medications for this visit.     Assessment & Plan   HYPERTENSION CONTROL Vitals:   08/16/23 1039 08/16/23 1045  BP: (!) 160/78 (!) 154/78    The patient's blood pressure is elevated above target today.  In order to address the patient's elevated BP: Blood pressure will be monitored at home to determine if medication changes need to be made.     Benign hypertensive heart disease without heart failure Assessment: BP is uncontrolled in office BP 154/78 mmHg;  above the goal (<130/80). Home readings are much  lower, although vary as the day goes on 2/2 pain levels Tolerates valsartan and carvedilol well without any side effects Denies SOB, palpitation, chest pain, headaches,or swelling Reiterated the importance of regular exercise and low salt diet   Plan:  Stop taking potassium supplement Continue taking valsartan and carvedilol Patient to keep record of BP readings with heart rate and report to [place] at the next visit Patient  to follow up with PharmD in 2 months - home averages are still slightly elevated, but will wait until at least 1 month post TKA before determining any changes to medication  Labs ordered today:  none  (has BMET Thursday pre-op)   Phillips Hay PharmD CPP [facility]  [address] [place], [place] [address] [phone]

## 2023-08-16 NOTE — Assessment & Plan Note (Signed)
Assessment: BP is uncontrolled in office BP 154/78 mmHg;  above the goal (<130/80). Home readings are much lower, although vary as the day goes on 2/2 pain levels Tolerates valsartan and carvedilol well without any side effects Denies SOB, palpitation, chest pain, headaches,or swelling Reiterated the importance of regular exercise and low salt diet   Plan:  Stop taking potassium supplement Continue taking valsartan and carvedilol Patient to keep record of BP readings with heart rate and report to [place] at the next visit Patient to follow up with PharmD in 2 months - home averages are still slightly elevated, but will wait until at least 1 month post TKA before determining any changes to medication  Labs ordered today:  none  (has BMET Thursday pre-op)

## 2023-08-18 NOTE — Progress Notes (Signed)
COVID Vaccine Completed: yes  Date of COVID positive in last 90 days:  PCP - Lynnea Ferrier, MD Cardiologist - Chilton Si, MD  Cardiac clearance by Chilton Si 07/15/23 in Epic  Coronary CT- 11/04/22 Epic Chest x-ray -  EKG - 07/15/23 Epic Stress Test -  ECHO - 08/21/22 Epic Cardiac Cath - age 77 and 30 Pacemaker/ICD device last checked: Spinal Cord Stimulator:  Bowel Prep -   Sleep Study -  CPAP -   Fasting Blood Sugar -  Checks Blood Sugar _____ times a day  Last dose of GLP1 agonist-  N/A GLP1 instructions:  N/A   Last dose of SGLT-2 inhibitors-  N/A SGLT-2 instructions: N/A   Blood Thinner Instructions:  Time Aspirin Instructions: Last Dose:  Activity level:  Can go up a flight of stairs and perform activities of daily living without stopping and without symptoms of chest pain or shortness of breath.  Able to exercise without symptoms  Unable to go up a flight of stairs without symptoms of     Anesthesia review: pulmonic stenosis, HTN, heart murmur  Patient denies shortness of breath, fever, cough and chest pain at PAT appointment  Patient verbalized understanding of instructions that were given to them at the PAT appointment. Patient was also instructed that they will need to review over the PAT instructions again at home before surgery.

## 2023-08-18 NOTE — Patient Instructions (Signed)
SURGICAL WAITING ROOM VISITATION  Patients having surgery or a procedure may have no more than 2 support people in the waiting area - these visitors may rotate.    Children under the age of 54 must have an adult with them who is not the patient.  Due to an increase in RSV and influenza rates and associated hospitalizations, children ages 64 and under may not visit patients in [facility] hospitals.  If the patient needs to stay at the hospital during part of their recovery, the visitor guidelines for inpatient rooms apply. Pre-op nurse will coordinate an appropriate time for 1 support person to accompany patient in pre-op.  This support person may not rotate.    Please refer to the [facility] website for the visitor guidelines for Inpatients (after your surgery is over and you are in a regular room).    Your procedure is scheduled on: 09/01/23   Report to [facility] Main Entrance    Report to admitting at 6:00 AM   Call this number if you have problems the morning of surgery [phone]   Do not eat food :After Midnight.   After Midnight you may have the following liquids until 5:30 AM DAY OF SURGERY  Water Non-Citrus Juices (without pulp, NO RED-Apple, White grape, White cranberry) Black Coffee (NO MILK/CREAM OR CREAMERS, sugar ok)  Clear Tea (NO MILK/CREAM OR CREAMERS, sugar ok) regular and decaf                             Plain Jell-O (NO RED)                                           Fruit ices (not with fruit pulp, NO RED)                                     Popsicles (NO RED)                                                               Sports drinks like Gatorade (NO RED)               The day of surgery:  Drink ONE (1) Pre-Surgery Clear Ensure at 5:30 AM the morning of surgery. Drink in one sitting. Do not sip.  This drink was given to you during your hospital  pre-op appointment visit. Nothing else to drink after completing the  Pre-Surgery Clear Ensure.           If you have questions, please contact your surgeon's office.   FOLLOW BOWEL PREP AND ANY ADDITIONAL PRE OP INSTRUCTIONS YOU RECEIVED FROM YOUR SURGEON'S OFFICE!!!     Oral Hygiene is also important to reduce your risk of infection.                                    Remember - BRUSH YOUR TEETH THE MORNING OF SURGERY WITH YOUR REGULAR TOOTHPASTE  DENTURES WILL BE REMOVED  PRIOR TO SURGERY PLEASE DO NOT APPLY "Poly grip" OR ADHESIVES!!!   Stop all vitamins and herbal supplements 7 days before surgery.   Take these medicines the morning of surgery with A SIP OF WATER: Tylenol, Carvedilol, Famotidine                               You may not have any metal on your body including hair pins, jewelry, and body piercing             Do not wear make-up, lotions, powders, perfumes, or deodorant  Do not wear nail polish including gel and S&S, artificial/acrylic nails, or any other type of covering on natural nails including finger and toenails. If you have artificial nails, gel coating, etc. that needs to be removed by a nail salon please have this removed prior to surgery or surgery may need to be canceled/ delayed if the surgeon/ anesthesia feels like they are unable to be safely monitored.   Do not shave  48 hours prior to surgery.    Do not bring valuables to the hospital.  IS NOT             RESPONSIBLE   FOR VALUABLES.   Contacts, glasses, dentures or bridgework may not be worn into surgery.   Bring small overnight bag day of surgery.   DO NOT BRING YOUR HOME MEDICATIONS TO THE HOSPITAL. PHARMACY WILL DISPENSE MEDICATIONS LISTED ON YOUR MEDICATION LIST TO YOU DURING YOUR ADMISSION IN THE HOSPITAL!              Please read over the following fact sheets you were given: IF YOU HAVE QUESTIONS ABOUT YOUR PRE-OP INSTRUCTIONS PLEASE CALL [name]    If you received a COVID test during your pre-op visit  it is requested that you wear a mask when out in public,  stay away from anyone that may not be feeling well and notify your surgeon if you develop symptoms. If you test positive for Covid or have been in contact with anyone that has tested positive in the last 10 days please notify you surgeon.      Pre-operative 5 CHG Bath Instructions   You can play a key role in reducing the risk of infection after surgery. Your skin needs to be as free of germs as possible. You can reduce the number of germs on your skin by washing with CHG (chlorhexidine gluconate) soap before surgery. CHG is an antiseptic soap that kills germs and continues to kill germs even after washing.   DO NOT use if you have an allergy to chlorhexidine/CHG or antibacterial soaps. If your skin becomes reddened or irritated, stop using the CHG and notify one of our RNs at [phone].   Please shower with the CHG soap starting 4 days before surgery using the following schedule:     Please keep in mind the following:  DO NOT shave, including legs and underarms, starting the day of your first shower.   You may shave your face at any point before/day of surgery.  Place clean sheets on your bed the day you start using CHG soap. Use a clean washcloth (not used since being washed) for each shower. DO NOT sleep with pets once you start using the CHG.   CHG Shower Instructions:  If you choose to wash your hair and private area, wash first with your normal shampoo/soap.  After you use shampoo/soap,  rinse your hair and body thoroughly to remove shampoo/soap residue.  Turn the water OFF and apply about 3 tablespoons (45 ml) of CHG soap to a CLEAN washcloth.  Apply CHG soap ONLY FROM YOUR NECK DOWN TO YOUR TOES (washing for 3-5 minutes)  DO NOT use CHG soap on face, private areas, open wounds, or sores.  Pay special attention to the area where your surgery is being performed.  If you are having back surgery, having someone wash your back for you may be helpful. Wait 2 minutes after CHG soap is  applied, then you may rinse off the CHG soap.  Pat dry with a clean towel  Put on clean clothes/pajamas   If you choose to wear lotion, please use ONLY the CHG-compatible lotions on the back of this paper.     Additional instructions for the day of surgery: DO NOT APPLY any lotions, deodorants, cologne, or perfumes.   Put on clean/comfortable clothes.  Brush your teeth.  Ask your nurse before applying any prescription medications to the skin.      CHG Compatible Lotions   Aveeno Moisturizing lotion  Cetaphil Moisturizing Cream  Cetaphil Moisturizing Lotion  Clairol Herbal Essence Moisturizing Lotion, Dry Skin  Clairol Herbal Essence Moisturizing Lotion, Extra Dry Skin  Clairol Herbal Essence Moisturizing Lotion, Normal Skin  Curel Age Defying Therapeutic Moisturizing Lotion with Alpha Hydroxy  Curel Extreme Care Body Lotion  Curel Soothing Hands Moisturizing Hand Lotion  Curel Therapeutic Moisturizing Cream, Fragrance-Free  Curel Therapeutic Moisturizing Lotion, Fragrance-Free  Curel Therapeutic Moisturizing Lotion, Original Formula  Eucerin Daily Replenishing Lotion  Eucerin Dry Skin Therapy Plus Alpha Hydroxy Crme  Eucerin Dry Skin Therapy Plus Alpha Hydroxy Lotion  Eucerin Original Crme  Eucerin Original Lotion  Eucerin Plus Crme Eucerin Plus Lotion  Eucerin TriLipid Replenishing Lotion  Keri Anti-Bacterial Hand Lotion  Keri Deep Conditioning Original Lotion Dry Skin Formula Softly Scented  Keri Deep Conditioning Original Lotion, Fragrance Free Sensitive Skin Formula  Keri Lotion Fast Absorbing Fragrance Free Sensitive Skin Formula  Keri Lotion Fast Absorbing Softly Scented Dry Skin Formula  Keri Original Lotion  Keri Skin Renewal Lotion Keri Silky Smooth Lotion  Keri Silky Smooth Sensitive Skin Lotion  Nivea Body Creamy Conditioning Oil  Nivea Body Extra Enriched Lotion  Nivea Body Original Lotion  Nivea Body Sheer Moisturizing Lotion Nivea Crme  Nivea Skin  Firming Lotion  NutraDerm 30 Skin Lotion  NutraDerm Skin Lotion  NutraDerm Therapeutic Skin Cream  NutraDerm Therapeutic Skin Lotion  ProShield Protective Hand Cream  Provon moisturizing lotion   Incentive Spirometer  An incentive spirometer is a tool that can help keep your lungs clear and active. This tool measures how well you are filling your lungs with each breath. Taking long deep breaths may help reverse or decrease the chance of developing breathing (pulmonary) problems (especially infection) following: A long period of time when you are unable to move or be active. BEFORE THE PROCEDURE  If the spirometer includes an indicator to show your best effort, your nurse or respiratory therapist will set it to a desired goal. If possible, sit up straight or lean slightly forward. Try not to slouch. Hold the incentive spirometer in an upright position. INSTRUCTIONS FOR USE  Sit on the edge of your bed if possible, or sit up as far as you can in bed or on a chair. Hold the incentive spirometer in an upright position. Breathe out normally. Place the mouthpiece in your mouth and seal your lips tightly  around it. Breathe in slowly and as deeply as possible, raising the piston or the ball toward the top of the column. Hold your breath for 3-5 seconds or for as long as possible. Allow the piston or ball to fall to the bottom of the column. Remove the mouthpiece from your mouth and breathe out normally. Rest for a few seconds and repeat Steps 1 through 7 at least 10 times every 1-2 hours when you are awake. Take your time and take a few normal breaths between deep breaths. The spirometer may include an indicator to show your best effort. Use the indicator as a goal to work toward during each repetition. After each set of 10 deep breaths, practice coughing to be sure your lungs are clear. If you have an incision (the cut made at the time of surgery), support your incision when coughing by placing a  pillow or rolled up towels firmly against it. Once you are able to get out of bed, walk around indoors and cough well. You may stop using the incentive spirometer when instructed by your caregiver.  RISKS AND COMPLICATIONS Take your time so you do not get dizzy or light-headed. If you are in pain, you may need to take or ask for pain medication before doing incentive spirometry. It is harder to take a deep breath if you are having pain. AFTER USE Rest and breathe slowly and easily. It can be helpful to keep track of a log of your progress. Your caregiver can provide you with a simple table to help with this. If you are using the spirometer at home, follow these instructions: SEEK MEDICAL CARE IF:  You are having difficultly using the spirometer. You have trouble using the spirometer as often as instructed. Your pain medication is not giving enough relief while using the spirometer. You develop fever of 100.5 F (38.1 C) or higher. SEEK IMMEDIATE MEDICAL CARE IF:  You cough up bloody sputum that had not been present before. You develop fever of 102 F (38.9 C) or greater. You develop worsening pain at or near the incision site. MAKE SURE YOU:  Understand these instructions. Will watch your condition. Will get help right away if you are not doing well or get worse. Document Released: 04/19/2007 Document Revised: 02/29/2012 Document Reviewed: 06/20/2007 [facility] Patient Information 2014 [place], [place].   ________________________________________________________________________

## 2023-08-19 ENCOUNTER — Encounter (HOSPITAL_COMMUNITY)
Admission: RE | Admit: 2023-08-19 | Discharge: 2023-08-19 | Disposition: A | Discharge status: Home / Self Care | Payer: Medicare PPO | Source: Ambulatory Visit | Attending: Orthopedic Surgery | Admitting: Orthopedic Surgery

## 2023-08-19 ENCOUNTER — Encounter (HOSPITAL_COMMUNITY): Payer: Self-pay

## 2023-08-19 ENCOUNTER — Other Ambulatory Visit: Payer: Self-pay

## 2023-08-19 VITALS — BP 165/75 | HR 64 | Temp 98.4°F | Resp 16 | Ht 64.0 in | Wt 182.0 lb

## 2023-08-19 DIAGNOSIS — I119 Hypertensive heart disease without heart failure: Secondary | ICD-10-CM | POA: Insufficient documentation

## 2023-08-19 DIAGNOSIS — Z01818 Encounter for other preprocedural examination: Secondary | ICD-10-CM

## 2023-08-19 DIAGNOSIS — Z01812 Encounter for preprocedural laboratory examination: Secondary | ICD-10-CM | POA: Diagnosis not present

## 2023-08-19 HISTORY — DX: Headache, unspecified: R51.9

## 2023-08-19 LAB — SURGICAL PCR SCREEN
MRSA, PCR: NEGATIVE
Staphylococcus aureus: NEGATIVE

## 2023-08-19 LAB — CBC
HCT: 38.9 % (ref 36.0–46.0)
Hemoglobin: 12.7 g/dL (ref 12.0–15.0)
MCH: 31.9 pg (ref 26.0–34.0)
MCHC: 32.6 g/dL (ref 30.0–36.0)
MCV: 97.7 fL (ref 80.0–100.0)
Platelets: 189 10*3/uL (ref 150–400)
RBC: 3.98 MIL/uL (ref 3.87–5.11)
RDW: 12.3 % (ref 11.5–15.5)
WBC: 4.9 10*3/uL (ref 4.0–10.5)
nRBC: 0 % (ref 0.0–0.2)

## 2023-08-19 LAB — BASIC METABOLIC PANEL
Anion gap: 7 (ref 5–15)
BUN: 15 mg/dL (ref 8–23)
CO2: 27 mmol/L (ref 22–32)
Calcium: 9 mg/dL (ref 8.9–10.3)
Chloride: 100 mmol/L (ref 98–111)
Creatinine, Ser: 0.51 mg/dL (ref 0.44–1.00)
GFR, Estimated: >60 mL/min (ref >60)
Glucose, Bld: 97 mg/dL (ref 70–99)
Potassium: 4.7 mmol/L (ref 3.5–5.1)
Sodium: 134 mmol/L — ABNORMAL LOW (ref 135–145)

## 2023-08-24 NOTE — Progress Notes (Signed)
Anesthesia Chart Review   Case: [id number] Date/Time: 09/01/23 0815   Procedure: COMPUTER ASSISTED TOTAL KNEE ARTHROPLASTY (Right: Knee) - 160   Anesthesia type: Spinal   Pre-op diagnosis: Right knee osteoarthritis   Location: [facility] ROOM 07 / [facility]   Surgeons: Samson Frederic, MD       DISCUSSION:77 y.o. never smoker with history of HTN, pulmonic stenosis, right knee OA scheduled for above procedure 09/01/2023 with Dr. Samson Frederic.  Patient last seen by cardiology 07/15/2023.  Per office visit note, pulmonic stenosis is stable with gradient across valve remaining consistent over the last 6 years.  No new symptoms.  Echo did reveal in 3 years or sooner if new symptoms develop.  Patient left seen by PCP 08/05/2023.  Stable at this visit.   VS: BP (!) 165/75   Pulse 64   Temp 36.9 C (Oral)   Resp 16   Ht 5\' 4"  (1.626 m)   Wt 82.6 kg   SpO2 99%   BMI 31.24 kg/m   PROVIDERS: Donita Brooks, MD  Cardiologist:  Chilton Si, MD      LABS: Labs reviewed: Acceptable for surgery. (all labs ordered are listed, but only abnormal results are displayed)  Labs Reviewed  BASIC METABOLIC PANEL - Abnormal; Notable for the following components:      Result Value   Sodium 134 (*)    All other components within normal limits  SURGICAL PCR SCREEN  CBC     IMAGES:   EKG:   CV: Echo 08/21/2022 1. Left ventricular ejection fraction, by estimation, is 60 to 65%. The  left ventricle has normal function. The left ventricle has no regional  wall motion abnormalities. Left ventricular diastolic parameters were  normal.   2. Right ventricular systolic function is normal. The right ventricular  size is normal.   3. Mild mitral valve regurgitation.   4. The aortic valve is tricuspid. Aortic valve regurgitation is mild.  Aortic valve sclerosis is present, with no evidence of aortic valve  stenosis.   5. PV is thickened, difficult to see. Peak and mean gradients through the  valve are  15 and 8 mm Hg respectively. No significant change from echo  report in 2020.  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/2010   echo   Osteopenia 12/2018   T score -1.6 FRAX 10% / 1.5%    Past Surgical History:  Procedure Laterality Date   ABDOMINAL HYSTERECTOMY  02/07/1981   RSO, left one ovary   APPENDECTOMY     BREAST SURGERY     CARDIAC CATHETERIZATION     when in 3rd grade and age 21   CARPAL TUNNEL RELEASE Left 02/06/2021   Procedure: LEFT CARPAL TUNNEL RELEASE;  Surgeon: Cindee Salt, MD;  Location: [facility];  Service: Orthopedics;  Laterality: Left;  IV REGIONAL FOREARM BLOCK 45 MINUTES   CARPAL TUNNEL RELEASE Right 06/03/2021   Procedure: RIGHT CARPAL TUNNEL RELEASE;  Surgeon: Cindee Salt, MD;  Location: [facility];  Service: Orthopedics;  Laterality: Right;  45 MIN   COSMETIC SURGERY     forehead plastic surgery     MOH's surgery  FRACTURE SURGERY     right arm   KNEE ARTHROSCOPY     right    MEDICATIONS:  acetaminophen (TYLENOL) 325 MG tablet   alendronate (FOSAMAX) 70 MG tablet   bismuth subsalicylate (PEPTO BISMOL) 262 MG chewable tablet   bismuth subsalicylate (PEPTO BISMOL) 262 MG/15ML suspension   carboxymethylcellulose (REFRESH PLUS) 0.5 % SOLN   carvedilol (COREG) 6.25 MG tablet   Cholecalciferol (VITAMIN D-3 PO)   clotrimazole (LOTRIMIN) 1 % external solution   diclofenac Sodium (VOLTAREN) 1 % GEL   Famotidine (PEPCID PO)   meloxicam (MOBIC) 15 MG tablet   Menthol-Methyl Salicylate (SALONPAS PAIN RELIEF PATCH) 3-10 % PTCH   potassium chloride (KLOR-CON) 10 MEQ tablet   PREVIDENT 5000 DRY MOUTH 1.1 % GEL dental gel   rosuvastatin (CRESTOR) 20 MG tablet   valsartan (DIOVAN) 320 MG  tablet   No current facility-administered medications for this encounter.    Jodell Cipro Ward, PA-C [facility] [phone]

## 2023-08-25 ENCOUNTER — Ambulatory Visit: Payer: Self-pay | Admitting: Student

## 2023-08-25 NOTE — H&P (Signed)
TOTAL KNEE ADMISSION H&P  Patient is being admitted for right total knee arthroplasty.  Subjective:  Chief Complaint:right knee pain.  HPI: Heather Hoover, 77 y.o. female, has a history of pain and functional disability in the right knee due to arthritis and has failed non-surgical conservative treatments for greater than 12 weeks to includeNSAID's and/or analgesics, corticosteriod injections, viscosupplementation injections, flexibility and strengthening excercises, use of assistive devices, and activity modification.  Onset of symptoms was gradual, starting 10 years ago with rapidlly worsening course since that time. The patient noted prior procedures on the knee to include  arthroscopy on the right knee(s).  Patient currently rates pain in the right knee(s) at 10 out of 10 with activity. Patient has night pain, worsening of pain with activity and weight bearing, pain that interferes with activities of daily living, pain with passive range of motion, crepitus, and joint swelling.  Patient has evidence of subchondral cysts, subchondral sclerosis, periarticular osteophytes, and joint space narrowing by imaging studies. There is no active infection.  Patient Active Problem List   Diagnosis Date Noted   Pain in joint of right knee 07/29/2023   Stiffness of right knee 07/29/2023   Impacted cerumen of right ear 07/21/2023   Ganglion of hand 07/19/2023   Trochanteric bursitis of left hip 05/10/2023   Exertional dyspnea 10/13/2022   Abnormal weight loss 07/10/2022   Colon cancer screening 07/10/2022   Gastroesophageal reflux disease 07/10/2022   Spondylosis without myelopathy or radiculopathy, cervical region 05/14/2020   Bilateral hand pain 04/26/2020   Neck pain 04/26/2020   Numbness 04/26/2020   Primary osteoarthritis of both first carpometacarpal joints 04/26/2020   Primary osteoarthritis of both hands 04/26/2020   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    Arthritis     Pulmonic stenosis, 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   Hypercholesterolemia 08/17/2011   Benign hypertensive heart disease without heart failure 08/17/2011   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/2010   echo   Osteopenia 12/2018   T score -1.6 FRAX 10% / 1.5%    Past Surgical History:  Procedure Laterality Date   ABDOMINAL HYSTERECTOMY  02/07/1981   RSO, left one ovary   APPENDECTOMY     BREAST SURGERY     CARDIAC CATHETERIZATION     when in 3rd grade and age 71   CARPAL TUNNEL RELEASE Left 02/06/2021   Procedure: LEFT CARPAL TUNNEL RELEASE;  Surgeon: Cindee Salt, MD;  Location: [facility];  Service: Orthopedics;  Laterality: Left;  IV REGIONAL FOREARM BLOCK 45 MINUTES   CARPAL TUNNEL RELEASE Right 06/03/2021   Procedure: RIGHT CARPAL TUNNEL RELEASE;  Surgeon: Cindee Salt, MD;  Location: [facility];  Service: Orthopedics;  Laterality: Right;  45 MIN   COSMETIC SURGERY     forehead plastic surgery     MOH's surgery   FRACTURE SURGERY     right arm   KNEE ARTHROSCOPY     right    Current Outpatient Medications  Medication Sig Dispense Refill Last Dose   acetaminophen (TYLENOL) 325 MG tablet Take 650 mg by mouth every 6 (six) hours as needed (pain.).      alendronate (FOSAMAX)  70 MG tablet Take 1 tablet (70 mg total) by mouth every 7 (seven) days. Take with a full glass of water on an empty stomach. (Patient taking differently: Take 70 mg by mouth every Thursday. Take with a full glass of water on an empty stomach.) 4 tablet 11    bismuth subsalicylate (PEPTO BISMOL) 262 MG chewable tablet Chew 262-524 mg by mouth 4 (four) times daily as needed for diarrhea or loose stools or indigestion.      bismuth subsalicylate (PEPTO  BISMOL) 262 MG/15ML suspension Take 30 mLs by mouth every 6 (six) hours as needed for diarrhea or loose stools or indigestion.      carboxymethylcellulose (REFRESH PLUS) 0.5 % SOLN Place 1 drop into both eyes 3 (three) times daily as needed (dry/irritated eyes.).      carvedilol (COREG) 6.25 MG tablet TAKE 1 TABLET(6.25 MG) BY MOUTH TWICE DAILY WITH A MEAL 180 tablet 1    Cholecalciferol (VITAMIN D-3 PO) Take 2,000 Units by mouth in the morning.      clotrimazole (LOTRIMIN) 1 % external solution Apply 1 Application topically 2 (two) times daily as needed (toe nail fungus).      diclofenac Sodium (VOLTAREN) 1 % GEL Apply 1 Application topically 4 (four) times daily as needed (pain).      Famotidine (PEPCID PO) Take 10 mg by mouth in the morning.      meloxicam (MOBIC) 15 MG tablet Take 1 tablet (15 mg total) by mouth daily. 90 tablet 3    Menthol-Methyl Salicylate (SALONPAS PAIN RELIEF PATCH) 3-10 % PTCH Apply topically.      potassium chloride (KLOR-CON) 10 MEQ tablet TAKE 1 TABLET(10 MEQ) BY MOUTH DAILY (Patient not taking: Reported on 08/19/2023) 90 tablet 1    PREVIDENT 5000 DRY MOUTH 1.1 % GEL dental gel Place 1 Application onto teeth in the morning.      rosuvastatin (CRESTOR) 20 MG tablet TAKE 1 TABLET(20 MG) BY MOUTH DAILY (Patient taking differently: Take 20 mg by mouth at bedtime.) 90 tablet 3    valsartan (DIOVAN) 320 MG tablet Take 1 tablet (320 mg total) by mouth daily. (Patient taking differently: Take 320 mg by mouth at bedtime.) 90 tablet 1    No current facility-administered medications for this visit.   Allergies  Allergen Reactions   Augmentin [Amoxicillin-Pot Clavulanate] Diarrhea    Did it involve swelling of the face/tongue/throat, SOB, or low BP? yes Did it involve sudden or severe rash/hives, skin peeling, or any reaction on the inside of your mouth or nose? no Did you need to seek medical attention at a hospital or doctor's office? no When did it last happen?       2000 If all above answers are "NO", may proceed with cephalosporin use.    Niacin-Lovastatin Er     Flushing,itching,syncope   Kenalog [Triamcinolone] Palpitations    Social History   Tobacco Use   Smoking status: Never   Smokeless tobacco: Never  Substance Use Topics   Alcohol use: Yes    Comment: Occas    Family History  Problem Relation Age of Onset   Hypertension Mother    Breast cancer Mother 77       Breast    Cancer Mother        uterine thye think   Cancer Father 2       prostate metastized to bone   Hypertension Father    Arthritis Father    Hypertension Sister    Breast cancer  Sister 58   Cancer Sister 44       uterine   Heart failure Sister    Muscular dystrophy Brother      Review of Systems  Musculoskeletal:  Positive for arthralgias, gait problem and joint swelling.  All other systems reviewed and are negative.   Objective:  Physical Exam Constitutional:      Appearance: Normal appearance.  HENT:     Head: Normocephalic and atraumatic.     Nose: Nose normal.     Mouth/Throat:     Mouth: Mucous membranes are moist.     Pharynx: Oropharynx is clear.  Eyes:     Conjunctiva/sclera: Conjunctivae normal.  Cardiovascular:     Rate and Rhythm: Normal rate and regular rhythm.     Pulses: Normal pulses.     Heart sounds: Normal heart sounds.  Pulmonary:     Effort: Pulmonary effort is normal.     Breath sounds: Normal breath sounds.  Abdominal:     General: Abdomen is flat.     Palpations: Abdomen is soft.  Genitourinary:    Comments: deferred Musculoskeletal:     Cervical back: Normal range of motion and neck supple.     Comments: Examination of the right knee reveals no skin wounds or lesions. Mild swelling, no effusion noted. No warmth or erythema. Tenderness to palpation over the medial joint line, periretinacular tissues with a positive grind sign. Patellofemoral crepitation with range of motion. Range of motion 0 to 130 degrees. No  ligamentous instability noted. No extensor lag. Painless hip range of motion.   Sensory and motor function intact in LE bilaterally. Palpable pedal pulses bilaterally.   No significant LE edema. Calves soft and non-tender.   Skin:    Capillary Refill: Capillary refill takes less than 2 seconds.  Neurological:     General: No focal deficit present.     Mental Status: She is alert and oriented to person, place, and time.  Psychiatric:        Mood and Affect: Mood normal.        Behavior: Behavior normal.        Thought Content: Thought content normal.        Judgment: Judgment normal.     Vital signs in last 24 hours: @VSRANGES @  Labs:   Estimated body mass index is 31.24 kg/m as calculated from the following:   Height as of 08/19/23: 5\' 4"  (1.626 m).   Weight as of 08/19/23: 82.6 kg.   Imaging Review Plain radiographs demonstrate severe degenerative joint disease of the right knee(s). The overall alignment ismild varus. The bone quality appears to be adequate for age and reported activity level.      Assessment/Plan:  End stage arthritis, right knee   The patient history, physical examination, clinical judgment of the provider and imaging studies are consistent with end stage degenerative joint disease of the right knee(s) and total knee arthroplasty is deemed medically necessary. The treatment options including medical management, injection therapy arthroscopy and arthroplasty were discussed at length. The risks and benefits of total knee arthroplasty were presented and reviewed. The risks due to aseptic loosening, infection, stiffness, patella tracking problems, thromboembolic complications and other imponderables were discussed. The patient acknowledged the explanation, agreed to proceed with the plan and consent was signed. Patient is being admitted for inpatient treatment for surgery, pain control, PT, OT, prophylactic antibiotics, VTE prophylaxis, progressive ambulation  and ADL's and discharge planning. The patient is planning to be discharged home  with OPPT after an overnight stay.   Therapy Plans: outpatient therapy. PT 09/06/23 at [facility]. Disposition: Home with husband and daughter Planned DVT Prophylaxis: aspirin 81mg  BID DME needed: Has rolling walker. Ice man today.  PCP: Cleared. Stop meloxicam 7 days prior to surgery.  Cardiology: Cleared.  TXA: IV Allergies:  - Kenalog - Palpitations - Niacin - flushing.  - Triamcinolone - palpitations.  Anesthesia Concerns: None. BMI: 28.6 Last HgbA1c: Not diabetic. Other: - Congenital pulmonic stenosis.  - Right palmar cyst, Dr. Yehuda Budd.  - Hold alendronate for 6 months  - Oxycodone, zofran, continue meloxicam.  - 08/19/23: K+ 4.7, Cr. 0.51, Hgb 12.7.    Patient's anticipated LOS is less than 2 midnights, meeting these requirements: - Younger than 36 - Lives within 1 hour of care - Has a competent adult at home to recover with post-op recover - NO history of  - Chronic pain requiring opiods  - Diabetes  - Coronary Artery Disease  - Heart failure  - Heart attack  - Stroke  - DVT/VTE  - Cardiac arrhythmia  - Respiratory Failure/COPD  - Renal failure  - Anemia  - Advanced Liver disease

## 2023-08-25 NOTE — H&P (View-Only) (Signed)
TOTAL KNEE ADMISSION H&P  Patient is being admitted for right total knee arthroplasty.  Subjective:  Chief Complaint:right knee pain.  HPI: Heather Hoover, 77 y.o. female, has a history of pain and functional disability in the right knee due to arthritis and has failed non-surgical conservative treatments for greater than 12 weeks to includeNSAID's and/or analgesics, corticosteriod injections, viscosupplementation injections, flexibility and strengthening excercises, use of assistive devices, and activity modification.  Onset of symptoms was gradual, starting 10 years ago with rapidlly worsening course since that time. The patient noted prior procedures on the knee to include  arthroscopy on the right knee(s).  Patient currently rates pain in the right knee(s) at 10 out of 10 with activity. Patient has night pain, worsening of pain with activity and weight bearing, pain that interferes with activities of daily living, pain with passive range of motion, crepitus, and joint swelling.  Patient has evidence of subchondral cysts, subchondral sclerosis, periarticular osteophytes, and joint space narrowing by imaging studies. There is no active infection.  Patient Active Problem List   Diagnosis Date Noted   Pain in joint of right knee 07/29/2023   Stiffness of right knee 07/29/2023   Impacted cerumen of right ear 07/21/2023   Ganglion of hand 07/19/2023   Trochanteric bursitis of left hip 05/10/2023   Exertional dyspnea 10/13/2022   Abnormal weight loss 07/10/2022   Colon cancer screening 07/10/2022   Gastroesophageal reflux disease 07/10/2022   Spondylosis without myelopathy or radiculopathy, cervical region 05/14/2020   Bilateral hand pain 04/26/2020   Neck pain 04/26/2020   Numbness 04/26/2020   Primary osteoarthritis of both first carpometacarpal joints 04/26/2020   Primary osteoarthritis of both hands 04/26/2020   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    Arthritis     Pulmonic stenosis, 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   Hypercholesterolemia 08/17/2011   Benign hypertensive heart disease without heart failure 08/17/2011   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/2010   echo   Osteopenia 12/2018   T score -1.6 FRAX 10% / 1.5%    Past Surgical History:  Procedure Laterality Date   ABDOMINAL HYSTERECTOMY  02/07/1981   RSO, left one ovary   APPENDECTOMY     BREAST SURGERY     CARDIAC CATHETERIZATION     when in 3rd grade and age 71   CARPAL TUNNEL RELEASE Left 02/06/2021   Procedure: LEFT CARPAL TUNNEL RELEASE;  Surgeon: Cindee Salt, MD;  Location: [facility];  Service: Orthopedics;  Laterality: Left;  IV REGIONAL FOREARM BLOCK 45 MINUTES   CARPAL TUNNEL RELEASE Right 06/03/2021   Procedure: RIGHT CARPAL TUNNEL RELEASE;  Surgeon: Cindee Salt, MD;  Location: [facility];  Service: Orthopedics;  Laterality: Right;  45 MIN   COSMETIC SURGERY     forehead plastic surgery     MOH's surgery   FRACTURE SURGERY     right arm   KNEE ARTHROSCOPY     right    Current Outpatient Medications  Medication Sig Dispense Refill Last Dose   acetaminophen (TYLENOL) 325 MG tablet Take 650 mg by mouth every 6 (six) hours as needed (pain.).      alendronate (FOSAMAX)  70 MG tablet Take 1 tablet (70 mg total) by mouth every 7 (seven) days. Take with a full glass of water on an empty stomach. (Patient taking differently: Take 70 mg by mouth every Thursday. Take with a full glass of water on an empty stomach.) 4 tablet 11    bismuth subsalicylate (PEPTO BISMOL) 262 MG chewable tablet Chew 262-524 mg by mouth 4 (four) times daily as needed for diarrhea or loose stools or indigestion.      bismuth subsalicylate (PEPTO  BISMOL) 262 MG/15ML suspension Take 30 mLs by mouth every 6 (six) hours as needed for diarrhea or loose stools or indigestion.      carboxymethylcellulose (REFRESH PLUS) 0.5 % SOLN Place 1 drop into both eyes 3 (three) times daily as needed (dry/irritated eyes.).      carvedilol (COREG) 6.25 MG tablet TAKE 1 TABLET(6.25 MG) BY MOUTH TWICE DAILY WITH A MEAL 180 tablet 1    Cholecalciferol (VITAMIN D-3 PO) Take 2,000 Units by mouth in the morning.      clotrimazole (LOTRIMIN) 1 % external solution Apply 1 Application topically 2 (two) times daily as needed (toe nail fungus).      diclofenac Sodium (VOLTAREN) 1 % GEL Apply 1 Application topically 4 (four) times daily as needed (pain).      Famotidine (PEPCID PO) Take 10 mg by mouth in the morning.      meloxicam (MOBIC) 15 MG tablet Take 1 tablet (15 mg total) by mouth daily. 90 tablet 3    Menthol-Methyl Salicylate (SALONPAS PAIN RELIEF PATCH) 3-10 % PTCH Apply topically.      potassium chloride (KLOR-CON) 10 MEQ tablet TAKE 1 TABLET(10 MEQ) BY MOUTH DAILY (Patient not taking: Reported on 08/19/2023) 90 tablet 1    PREVIDENT 5000 DRY MOUTH 1.1 % GEL dental gel Place 1 Application onto teeth in the morning.      rosuvastatin (CRESTOR) 20 MG tablet TAKE 1 TABLET(20 MG) BY MOUTH DAILY (Patient taking differently: Take 20 mg by mouth at bedtime.) 90 tablet 3    valsartan (DIOVAN) 320 MG tablet Take 1 tablet (320 mg total) by mouth daily. (Patient taking differently: Take 320 mg by mouth at bedtime.) 90 tablet 1    No current facility-administered medications for this visit.   Allergies  Allergen Reactions   Augmentin [Amoxicillin-Pot Clavulanate] Diarrhea    Did it involve swelling of the face/tongue/throat, SOB, or low BP? yes Did it involve sudden or severe rash/hives, skin peeling, or any reaction on the inside of your mouth or nose? no Did you need to seek medical attention at a hospital or doctor's office? no When did it last happen?       2000 If all above answers are "NO", may proceed with cephalosporin use.    Niacin-Lovastatin Er     Flushing,itching,syncope   Kenalog [Triamcinolone] Palpitations    Social History   Tobacco Use   Smoking status: Never   Smokeless tobacco: Never  Substance Use Topics   Alcohol use: Yes    Comment: Occas    Family History  Problem Relation Age of Onset   Hypertension Mother    Breast cancer Mother 77       Breast    Cancer Mother        uterine thye think   Cancer Father 2       prostate metastized to bone   Hypertension Father    Arthritis Father    Hypertension Sister    Breast cancer  Sister 58   Cancer Sister 44       uterine   Heart failure Sister    Muscular dystrophy Brother      Review of Systems  Musculoskeletal:  Positive for arthralgias, gait problem and joint swelling.  All other systems reviewed and are negative.   Objective:  Physical Exam Constitutional:      Appearance: Normal appearance.  HENT:     Head: Normocephalic and atraumatic.     Nose: Nose normal.     Mouth/Throat:     Mouth: Mucous membranes are moist.     Pharynx: Oropharynx is clear.  Eyes:     Conjunctiva/sclera: Conjunctivae normal.  Cardiovascular:     Rate and Rhythm: Normal rate and regular rhythm.     Pulses: Normal pulses.     Heart sounds: Normal heart sounds.  Pulmonary:     Effort: Pulmonary effort is normal.     Breath sounds: Normal breath sounds.  Abdominal:     General: Abdomen is flat.     Palpations: Abdomen is soft.  Genitourinary:    Comments: deferred Musculoskeletal:     Cervical back: Normal range of motion and neck supple.     Comments: Examination of the right knee reveals no skin wounds or lesions. Mild swelling, no effusion noted. No warmth or erythema. Tenderness to palpation over the medial joint line, periretinacular tissues with a positive grind sign. Patellofemoral crepitation with range of motion. Range of motion 0 to 130 degrees. No  ligamentous instability noted. No extensor lag. Painless hip range of motion.   Sensory and motor function intact in LE bilaterally. Palpable pedal pulses bilaterally.   No significant LE edema. Calves soft and non-tender.   Skin:    Capillary Refill: Capillary refill takes less than 2 seconds.  Neurological:     General: No focal deficit present.     Mental Status: Heather Hoover is alert and oriented to person, place, and time.  Psychiatric:        Mood and Affect: Mood normal.        Behavior: Behavior normal.        Thought Content: Thought content normal.        Judgment: Judgment normal.     Vital signs in last 24 hours: @VSRANGES @  Labs:   Estimated body mass index is 31.24 kg/m as calculated from the following:   Height as of 08/19/23: 5\' 4"  (1.626 m).   Weight as of 08/19/23: 82.6 kg.   Imaging Review Plain radiographs demonstrate severe degenerative joint disease of the right knee(s). The overall alignment ismild varus. The bone quality appears to be adequate for age and reported activity level.      Assessment/Plan:  End stage arthritis, right knee   The patient history, physical examination, clinical judgment of the provider and imaging studies are consistent with end stage degenerative joint disease of the right knee(s) and total knee arthroplasty is deemed medically necessary. The treatment options including medical management, injection therapy arthroscopy and arthroplasty were discussed at length. The risks and benefits of total knee arthroplasty were presented and reviewed. The risks due to aseptic loosening, infection, stiffness, patella tracking problems, thromboembolic complications and other imponderables were discussed. The patient acknowledged the explanation, agreed to proceed with the plan and consent was signed. Patient is being admitted for inpatient treatment for surgery, pain control, PT, OT, prophylactic antibiotics, VTE prophylaxis, progressive ambulation  and ADL's and discharge planning. The patient is planning to be discharged home  with OPPT after an overnight stay.   Therapy Plans: outpatient therapy. PT 09/06/23 at [facility]. Disposition: Home with husband and daughter Planned DVT Prophylaxis: aspirin 81mg  BID DME needed: Has rolling walker. Ice man today.  PCP: Cleared. Stop meloxicam 7 days prior to surgery.  Cardiology: Cleared.  TXA: IV Allergies:  - Kenalog - Palpitations - Niacin - flushing.  - Triamcinolone - palpitations.  Anesthesia Concerns: None. BMI: 28.6 Last HgbA1c: Not diabetic. Other: - Congenital pulmonic stenosis.  - Right palmar cyst, Dr. Yehuda Budd.  - Hold alendronate for 6 months  - Oxycodone, zofran, continue meloxicam.  - 08/19/23: K+ 4.7, Cr. 0.51, Hgb 12.7.    Patient's anticipated LOS is less than 2 midnights, meeting these requirements: - Younger than 36 - Lives within 1 hour of care - Has a competent adult at home to recover with post-op recover - NO history of  - Chronic pain requiring opiods  - Diabetes  - Coronary Artery Disease  - Heart failure  - Heart attack  - Stroke  - DVT/VTE  - Cardiac arrhythmia  - Respiratory Failure/COPD  - Renal failure  - Anemia  - Advanced Liver disease

## 2023-09-01 ENCOUNTER — Ambulatory Visit (HOSPITAL_COMMUNITY): Payer: Medicare PPO | Admitting: Physician Assistant

## 2023-09-01 ENCOUNTER — Other Ambulatory Visit: Payer: Self-pay

## 2023-09-01 ENCOUNTER — Encounter (HOSPITAL_COMMUNITY)
Admission: RE | Disposition: A | Discharge status: Home / Self Care | Payer: Self-pay | Source: Home / Self Care | Attending: Orthopedic Surgery

## 2023-09-01 ENCOUNTER — Encounter (HOSPITAL_COMMUNITY): Payer: Self-pay | Admitting: Orthopedic Surgery

## 2023-09-01 ENCOUNTER — Inpatient Hospital Stay (HOSPITAL_COMMUNITY)
Admission: RE | Admit: 2023-09-01 | Discharge: 2023-09-13 | DRG: 469 | Disposition: A | Discharge status: Home / Self Care | Payer: Medicare PPO | Attending: Internal Medicine | Admitting: Internal Medicine

## 2023-09-01 ENCOUNTER — Ambulatory Visit (HOSPITAL_COMMUNITY): Payer: Medicare PPO | Admitting: Certified Registered Nurse Anesthetist

## 2023-09-01 ENCOUNTER — Observation Stay (HOSPITAL_COMMUNITY): Payer: Medicare PPO

## 2023-09-01 DIAGNOSIS — Z820 Family history of epilepsy and other diseases of the nervous system: Secondary | ICD-10-CM | POA: Diagnosis not present

## 2023-09-01 DIAGNOSIS — M8580 Other specified disorders of bone density and structure, unspecified site: Secondary | ICD-10-CM | POA: Diagnosis present

## 2023-09-01 DIAGNOSIS — R339 Retention of urine, unspecified: Secondary | ICD-10-CM | POA: Diagnosis not present

## 2023-09-01 DIAGNOSIS — Z96651 Presence of right artificial knee joint: Secondary | ICD-10-CM

## 2023-09-01 DIAGNOSIS — I951 Orthostatic hypotension: Secondary | ICD-10-CM | POA: Diagnosis not present

## 2023-09-01 DIAGNOSIS — M1711 Unilateral primary osteoarthritis, right knee: Principal | ICD-10-CM

## 2023-09-01 DIAGNOSIS — R600 Localized edema: Secondary | ICD-10-CM | POA: Diagnosis not present

## 2023-09-01 DIAGNOSIS — Z803 Family history of malignant neoplasm of breast: Secondary | ICD-10-CM | POA: Diagnosis not present

## 2023-09-01 DIAGNOSIS — Q256 Stenosis of pulmonary artery: Secondary | ICD-10-CM | POA: Diagnosis not present

## 2023-09-01 DIAGNOSIS — Z471 Aftercare following joint replacement surgery: Secondary | ICD-10-CM | POA: Diagnosis not present

## 2023-09-01 DIAGNOSIS — R1032 Left lower quadrant pain: Secondary | ICD-10-CM | POA: Diagnosis not present

## 2023-09-01 DIAGNOSIS — Z8249 Family history of ischemic heart disease and other diseases of the circulatory system: Secondary | ICD-10-CM | POA: Diagnosis not present

## 2023-09-01 DIAGNOSIS — R110 Nausea: Secondary | ICD-10-CM | POA: Diagnosis not present

## 2023-09-01 DIAGNOSIS — Z7983 Long term (current) use of bisphosphonates: Secondary | ICD-10-CM | POA: Diagnosis not present

## 2023-09-01 DIAGNOSIS — E871 Hypo-osmolality and hyponatremia: Secondary | ICD-10-CM | POA: Diagnosis not present

## 2023-09-01 DIAGNOSIS — R630 Anorexia: Secondary | ICD-10-CM | POA: Diagnosis not present

## 2023-09-01 DIAGNOSIS — Z888 Allergy status to other drugs, medicaments and biological substances status: Secondary | ICD-10-CM | POA: Diagnosis not present

## 2023-09-01 DIAGNOSIS — E7800 Pure hypercholesterolemia, unspecified: Secondary | ICD-10-CM | POA: Diagnosis present

## 2023-09-01 DIAGNOSIS — Z79899 Other long term (current) drug therapy: Secondary | ICD-10-CM | POA: Diagnosis not present

## 2023-09-01 DIAGNOSIS — D62 Acute posthemorrhagic anemia: Secondary | ICD-10-CM | POA: Diagnosis not present

## 2023-09-01 DIAGNOSIS — K649 Unspecified hemorrhoids: Secondary | ICD-10-CM | POA: Diagnosis present

## 2023-09-01 DIAGNOSIS — Z791 Long term (current) use of non-steroidal anti-inflammatories (NSAID): Secondary | ICD-10-CM | POA: Diagnosis not present

## 2023-09-01 DIAGNOSIS — R197 Diarrhea, unspecified: Secondary | ICD-10-CM | POA: Diagnosis not present

## 2023-09-01 DIAGNOSIS — K5900 Constipation, unspecified: Secondary | ICD-10-CM | POA: Diagnosis present

## 2023-09-01 DIAGNOSIS — E222 Syndrome of inappropriate secretion of antidiuretic hormone: Secondary | ICD-10-CM | POA: Diagnosis present

## 2023-09-01 DIAGNOSIS — Z90710 Acquired absence of both cervix and uterus: Secondary | ICD-10-CM | POA: Diagnosis not present

## 2023-09-01 DIAGNOSIS — Z7409 Other reduced mobility: Secondary | ICD-10-CM | POA: Diagnosis not present

## 2023-09-01 DIAGNOSIS — N3941 Urge incontinence: Secondary | ICD-10-CM | POA: Diagnosis present

## 2023-09-01 DIAGNOSIS — Z8261 Family history of arthritis: Secondary | ICD-10-CM

## 2023-09-01 DIAGNOSIS — Z881 Allergy status to other antibiotic agents status: Secondary | ICD-10-CM

## 2023-09-01 DIAGNOSIS — I119 Hypertensive heart disease without heart failure: Secondary | ICD-10-CM | POA: Diagnosis present

## 2023-09-01 DIAGNOSIS — K921 Melena: Secondary | ICD-10-CM | POA: Diagnosis not present

## 2023-09-01 HISTORY — PX: KNEE ARTHROPLASTY: SHX992

## 2023-09-01 SURGERY — ARTHROPLASTY, KNEE, TOTAL, USING IMAGELESS COMPUTER-ASSISTED NAVIGATION
Anesthesia: Spinal | Site: Knee | Laterality: Right

## 2023-09-01 MED ORDER — CEFAZOLIN SODIUM-DEXTROSE 2-4 GM/100ML-% IV SOLN
2.0000 g | Freq: Four times a day (QID) | INTRAVENOUS | Status: AC
  Administered 2023-09-01 (×2): 2 g via INTRAVENOUS
  Filled 2023-09-01 (×2): qty 100

## 2023-09-01 MED ORDER — ORAL CARE MOUTH RINSE: 15.0000 mL | Freq: Once | OROMUCOSAL | Status: AC

## 2023-09-01 MED ORDER — FENTANYL CITRATE (PF) 250 MCG/5ML IJ SOLN
INTRAMUSCULAR | Status: DC | PRN
  Administered 2023-09-01 (×2): 50 ug via INTRAVENOUS

## 2023-09-01 MED ORDER — SODIUM CHLORIDE 0.9 % IR SOLN
Status: DC | PRN
  Administered 2023-09-01: 3000 mL
  Administered 2023-09-01: 1000 mL

## 2023-09-01 MED ORDER — ONDANSETRON HCL 4 MG/2ML IJ SOLN
4.0000 mg | Freq: Four times a day (QID) | INTRAMUSCULAR | Status: DC | PRN
  Administered 2023-09-02 – 2023-09-08 (×3): 4 mg via INTRAVENOUS
  Filled 2023-09-01 (×3): qty 2

## 2023-09-01 MED ORDER — ACETAMINOPHEN 325 MG PO TABS: 325.0000 mg | ORAL_TABLET | Freq: Once | ORAL | Status: DC | PRN

## 2023-09-01 MED ORDER — ACETAMINOPHEN 325 MG PO TABS
325.0000 mg | ORAL_TABLET | Freq: Four times a day (QID) | ORAL | Status: DC | PRN
  Administered 2023-09-02 – 2023-09-07 (×6): 650 mg via ORAL
  Filled 2023-09-01 (×6): qty 2

## 2023-09-01 MED ORDER — KETOROLAC TROMETHAMINE 30 MG/ML IJ SOLN
INTRAMUSCULAR | Status: DC | PRN
  Administered 2023-09-01: 30 mg

## 2023-09-01 MED ORDER — HYDROMORPHONE HCL 1 MG/ML IJ SOLN
0.2500 mg | INTRAMUSCULAR | Status: DC | PRN
  Administered 2023-09-01 (×2): 0.5 mg via INTRAVENOUS

## 2023-09-01 MED ORDER — PHENOL 1.4 % MT LIQD: 1.0000 | OROMUCOSAL | Status: DC | PRN

## 2023-09-01 MED ORDER — ALUM & MAG HYDROXIDE-SIMETH 200-200-20 MG/5ML PO SUSP: 30.0000 mL | ORAL | Status: DC | PRN

## 2023-09-01 MED ORDER — SODIUM CHLORIDE (PF) 0.9 % IJ SOLN
INTRAMUSCULAR | Status: AC
  Filled 2023-09-01: qty 30

## 2023-09-01 MED ORDER — KETOROLAC TROMETHAMINE 30 MG/ML IJ SOLN
INTRAMUSCULAR | Status: AC
  Filled 2023-09-01: qty 1

## 2023-09-01 MED ORDER — MENTHOL 3 MG MT LOZG: 1.0000 | LOZENGE | OROMUCOSAL | Status: DC | PRN

## 2023-09-01 MED ORDER — HYDROMORPHONE HCL 1 MG/ML IJ SOLN: 0.5000 mg | INTRAMUSCULAR | Status: DC | PRN

## 2023-09-01 MED ORDER — HYDROMORPHONE HCL 1 MG/ML IJ SOLN
INTRAMUSCULAR | Status: AC
  Filled 2023-09-01: qty 1

## 2023-09-01 MED ORDER — PHENYLEPHRINE HCL-NACL 20-0.9 MG/250ML-% IV SOLN
INTRAVENOUS | Status: DC | PRN
Start: 2023-09-01 — End: 2023-09-01
  Administered 2023-09-01: 15 ug/min via INTRAVENOUS

## 2023-09-01 MED ORDER — BISACODYL 10 MG RE SUPP
10.0000 mg | Freq: Every day | RECTAL | Status: DC | PRN
  Administered 2023-09-05: 10 mg via RECTAL
  Filled 2023-09-01: qty 1

## 2023-09-01 MED ORDER — SODIUM CHLORIDE 0.9 % IV SOLN: INTRAVENOUS | Status: DC

## 2023-09-01 MED ORDER — BUPIVACAINE IN DEXTROSE 0.75-8.25 % IT SOLN
INTRATHECAL | Status: DC | PRN
Start: 2023-09-01 — End: 2023-09-01
  Administered 2023-09-01: 2 mL via INTRATHECAL

## 2023-09-01 MED ORDER — SENNA 8.6 MG PO TABS
1.0000 | ORAL_TABLET | Freq: Two times a day (BID) | ORAL | Status: DC
  Administered 2023-09-01 – 2023-09-05 (×8): 8.6 mg via ORAL
  Filled 2023-09-01 (×11): qty 1

## 2023-09-01 MED ORDER — ONDANSETRON HCL 4 MG/2ML IJ SOLN
INTRAMUSCULAR | Status: AC
  Filled 2023-09-01: qty 2

## 2023-09-01 MED ORDER — BUPIVACAINE-EPINEPHRINE 0.25% -1:200000 IJ SOLN
INTRAMUSCULAR | Status: DC | PRN
  Administered 2023-09-01: 30 mL

## 2023-09-01 MED ORDER — METHOCARBAMOL 500 MG PO TABS
500.0000 mg | ORAL_TABLET | Freq: Four times a day (QID) | ORAL | Status: DC | PRN
  Administered 2023-09-02 – 2023-09-04 (×5): 500 mg via ORAL
  Filled 2023-09-01 (×5): qty 1

## 2023-09-01 MED ORDER — BUPIVACAINE-EPINEPHRINE 0.25% -1:200000 IJ SOLN
INTRAMUSCULAR | Status: AC
  Filled 2023-09-01: qty 1

## 2023-09-01 MED ORDER — KETOROLAC TROMETHAMINE 15 MG/ML IJ SOLN
7.5000 mg | Freq: Four times a day (QID) | INTRAMUSCULAR | Status: AC
  Administered 2023-09-01 – 2023-09-02 (×4): 7.5 mg via INTRAVENOUS
  Filled 2023-09-01 (×4): qty 1

## 2023-09-01 MED ORDER — PROPOFOL 10 MG/ML IV BOLUS
INTRAVENOUS | Status: DC | PRN
  Administered 2023-09-01: 30 mg via INTRAVENOUS
  Administered 2023-09-01: 10 mg via INTRAVENOUS

## 2023-09-01 MED ORDER — MIDAZOLAM HCL 2 MG/2ML IJ SOLN
INTRAMUSCULAR | Status: DC | PRN
  Administered 2023-09-01 (×2): 1 mg via INTRAVENOUS

## 2023-09-01 MED ORDER — ROSUVASTATIN CALCIUM 20 MG PO TABS
20.0000 mg | ORAL_TABLET | Freq: Every day | ORAL | Status: DC
  Administered 2023-09-01 – 2023-09-12 (×12): 20 mg via ORAL
  Filled 2023-09-01 (×12): qty 1

## 2023-09-01 MED ORDER — CEFAZOLIN SODIUM-DEXTROSE 2-4 GM/100ML-% IV SOLN
2.0000 g | INTRAVENOUS | Status: AC
  Administered 2023-09-01: 2 g via INTRAVENOUS
  Filled 2023-09-01: qty 100

## 2023-09-01 MED ORDER — PROPOFOL 500 MG/50ML IV EMUL
INTRAVENOUS | Status: AC
  Filled 2023-09-01: qty 50

## 2023-09-01 MED ORDER — POVIDONE-IODINE 10 % EX SWAB
2.0000 | Freq: Once | CUTANEOUS | Status: AC
  Administered 2023-09-01: 2 via TOPICAL

## 2023-09-01 MED ORDER — OXYCODONE HCL 5 MG PO TABS
ORAL_TABLET | ORAL | Status: AC
  Filled 2023-09-01: qty 1

## 2023-09-01 MED ORDER — PANTOPRAZOLE SODIUM 40 MG PO TBEC
40.0000 mg | DELAYED_RELEASE_TABLET | Freq: Every day | ORAL | Status: DC
  Administered 2023-09-01 – 2023-09-13 (×12): 40 mg via ORAL
  Filled 2023-09-01 (×13): qty 1

## 2023-09-01 MED ORDER — ACETAMINOPHEN 10 MG/ML IV SOLN: 1000.0000 mg | Freq: Once | INTRAVENOUS | Status: DC | PRN

## 2023-09-01 MED ORDER — METOCLOPRAMIDE HCL 5 MG PO TABS
5.0000 mg | ORAL_TABLET | Freq: Three times a day (TID) | ORAL | Status: DC | PRN
  Administered 2023-09-07 – 2023-09-08 (×2): 10 mg via ORAL
  Filled 2023-09-01 (×2): qty 2

## 2023-09-01 MED ORDER — OXYCODONE HCL 5 MG PO TABS
5.0000 mg | ORAL_TABLET | ORAL | Status: DC | PRN
  Administered 2023-09-01: 10 mg via ORAL
  Administered 2023-09-01: 5 mg via ORAL
  Administered 2023-09-01 – 2023-09-02 (×6): 10 mg via ORAL
  Administered 2023-09-03: 5 mg via ORAL
  Administered 2023-09-03 – 2023-09-04 (×4): 10 mg via ORAL
  Administered 2023-09-05: 5 mg via ORAL
  Administered 2023-09-05 – 2023-09-06 (×2): 10 mg via ORAL
  Administered 2023-09-06: 5 mg via ORAL
  Filled 2023-09-01 (×10): qty 2
  Filled 2023-09-01: qty 1
  Filled 2023-09-01: qty 2
  Filled 2023-09-01: qty 1
  Filled 2023-09-01 (×2): qty 2

## 2023-09-01 MED ORDER — ONDANSETRON HCL 4 MG PO TABS
4.0000 mg | ORAL_TABLET | Freq: Four times a day (QID) | ORAL | Status: DC | PRN
  Administered 2023-09-07 – 2023-09-08 (×4): 4 mg via ORAL
  Filled 2023-09-01 (×4): qty 1

## 2023-09-01 MED ORDER — TRANEXAMIC ACID-NACL 1000-0.7 MG/100ML-% IV SOLN
1000.0000 mg | INTRAVENOUS | Status: AC
  Administered 2023-09-01: 1000 mg via INTRAVENOUS
  Filled 2023-09-01: qty 100

## 2023-09-01 MED ORDER — CHLORHEXIDINE GLUCONATE 0.12 % MT SOLN
15.0000 mL | Freq: Once | OROMUCOSAL | Status: AC
  Administered 2023-09-01: 15 mL via OROMUCOSAL

## 2023-09-01 MED ORDER — CARVEDILOL 6.25 MG PO TABS
6.2500 mg | ORAL_TABLET | Freq: Two times a day (BID) | ORAL | Status: DC
  Administered 2023-09-01 – 2023-09-09 (×15): 6.25 mg via ORAL
  Filled 2023-09-01 (×15): qty 1

## 2023-09-01 MED ORDER — DOCUSATE SODIUM 100 MG PO CAPS
100.0000 mg | ORAL_CAPSULE | Freq: Two times a day (BID) | ORAL | Status: DC
  Administered 2023-09-01 – 2023-09-05 (×8): 100 mg via ORAL
  Filled 2023-09-01 (×15): qty 1

## 2023-09-01 MED ORDER — PROPOFOL 500 MG/50ML IV EMUL
INTRAVENOUS | Status: DC | PRN
Start: 2023-09-01 — End: 2023-09-01
  Administered 2023-09-01: 30 ug/kg/min via INTRAVENOUS

## 2023-09-01 MED ORDER — PHENYLEPHRINE HCL-NACL 20-0.9 MG/250ML-% IV SOLN
INTRAVENOUS | Status: AC
  Filled 2023-09-01: qty 250

## 2023-09-01 MED ORDER — CARBOXYMETHYLCELLULOSE SODIUM 0.5 % OP SOLN: 1.0000 [drp] | Freq: Three times a day (TID) | OPHTHALMIC | Status: DC | PRN

## 2023-09-01 MED ORDER — DIPHENHYDRAMINE HCL 12.5 MG/5ML PO ELIX: 12.5000 mg | ORAL_SOLUTION | ORAL | Status: DC | PRN

## 2023-09-01 MED ORDER — ACETAMINOPHEN 160 MG/5ML PO SOLN: 325.0000 mg | Freq: Once | ORAL | Status: DC | PRN

## 2023-09-01 MED ORDER — METHOCARBAMOL 500 MG IVPB - SIMPLE MED
500.0000 mg | Freq: Four times a day (QID) | INTRAVENOUS | Status: DC | PRN
  Administered 2023-09-01: 500 mg via INTRAVENOUS

## 2023-09-01 MED ORDER — METHOCARBAMOL 500 MG IVPB - SIMPLE MED
INTRAVENOUS | Status: AC
  Filled 2023-09-01: qty 55

## 2023-09-01 MED ORDER — DROPERIDOL 2.5 MG/ML IJ SOLN: 0.6250 mg | Freq: Once | INTRAMUSCULAR | Status: DC | PRN

## 2023-09-01 MED ORDER — PROMETHAZINE HCL 25 MG/ML IJ SOLN: 6.2500 mg | INTRAMUSCULAR | Status: DC | PRN

## 2023-09-01 MED ORDER — ISOPROPYL ALCOHOL 70 % SOLN
Status: DC | PRN
  Administered 2023-09-01: 1 via TOPICAL

## 2023-09-01 MED ORDER — OXYCODONE HCL 5 MG PO TABS
10.0000 mg | ORAL_TABLET | ORAL | Status: DC | PRN
  Administered 2023-09-03 – 2023-09-04 (×2): 10 mg via ORAL
  Filled 2023-09-01 (×3): qty 2

## 2023-09-01 MED ORDER — MIDAZOLAM HCL 2 MG/2ML IJ SOLN
INTRAMUSCULAR | Status: AC
  Filled 2023-09-01: qty 2

## 2023-09-01 MED ORDER — LACTATED RINGERS IV SOLN: INTRAVENOUS | Status: DC

## 2023-09-01 MED ORDER — ONDANSETRON HCL 4 MG/2ML IJ SOLN
INTRAMUSCULAR | Status: DC | PRN
  Administered 2023-09-01: 4 mg via INTRAVENOUS

## 2023-09-01 MED ORDER — METOCLOPRAMIDE HCL 5 MG/ML IJ SOLN: 5.0000 mg | Freq: Three times a day (TID) | INTRAMUSCULAR | Status: DC | PRN

## 2023-09-01 MED ORDER — ACETAMINOPHEN 500 MG PO TABS
1000.0000 mg | ORAL_TABLET | Freq: Once | ORAL | Status: AC
  Administered 2023-09-01: 1000 mg via ORAL
  Filled 2023-09-01: qty 2

## 2023-09-01 MED ORDER — ACETAMINOPHEN 500 MG PO TABS
1000.0000 mg | ORAL_TABLET | Freq: Four times a day (QID) | ORAL | Status: AC
  Administered 2023-09-01 – 2023-09-02 (×4): 1000 mg via ORAL
  Filled 2023-09-01 (×4): qty 2

## 2023-09-01 MED ORDER — SODIUM FLUORIDE 1.1 % DT GEL: 1.0000 | Freq: Every morning | DENTAL | Status: DC

## 2023-09-01 MED ORDER — POLYVINYL ALCOHOL 1.4 % OP SOLN: 1.0000 [drp] | OPHTHALMIC | Status: DC | PRN

## 2023-09-01 MED ORDER — POLYETHYLENE GLYCOL 3350 17 G PO PACK
17.0000 g | PACK | Freq: Every day | ORAL | Status: DC | PRN
  Administered 2023-09-04: 17 g via ORAL
  Filled 2023-09-01: qty 1

## 2023-09-01 MED ORDER — SODIUM CHLORIDE (PF) 0.9 % IJ SOLN
INTRAMUSCULAR | Status: DC | PRN
  Administered 2023-09-01: 30 mL

## 2023-09-01 MED ORDER — PROPOFOL 10 MG/ML IV BOLUS
INTRAVENOUS | Status: AC
  Filled 2023-09-01: qty 20

## 2023-09-01 MED ORDER — ASPIRIN 81 MG PO CHEW
81.0000 mg | CHEWABLE_TABLET | Freq: Two times a day (BID) | ORAL | Status: DC
  Administered 2023-09-01 – 2023-09-09 (×17): 81 mg via ORAL
  Filled 2023-09-01 (×18): qty 1

## 2023-09-01 MED ORDER — POVIDONE-IODINE 10 % EX SWAB: 2.0000 | Freq: Once | CUTANEOUS | Status: DC

## 2023-09-01 MED ORDER — FENTANYL CITRATE (PF) 250 MCG/5ML IJ SOLN
INTRAMUSCULAR | Status: AC
  Filled 2023-09-01: qty 5

## 2023-09-01 MED ORDER — ROPIVACAINE HCL 5 MG/ML IJ SOLN
INTRAMUSCULAR | Status: DC | PRN
Start: 2023-09-01 — End: 2023-09-01
  Administered 2023-09-01: 30 mL via PERINEURAL

## 2023-09-01 MED ORDER — STERILE WATER FOR IRRIGATION IR SOLN
Status: DC | PRN
  Administered 2023-09-01: 2000 mL

## 2023-09-01 MED ORDER — MEPERIDINE HCL 50 MG/ML IJ SOLN: 6.2500 mg | INTRAMUSCULAR | Status: DC | PRN

## 2023-09-01 SURGICAL SUPPLY — 72 items
ADH SKN CLS APL DERMABOND .7 (GAUZE/BANDAGES/DRESSINGS) ×1
APL PRP STRL LF DISP 70% ISPRP (MISCELLANEOUS) ×2
BAG COUNTER SPONGE SURGICOUNT (BAG) IMPLANT
BAG SPEC THK2 15X12 ZIP CLS (MISCELLANEOUS)
BAG SPNG CNTER NS LX DISP (BAG)
BAG ZIPLOCK 12X15 (MISCELLANEOUS) IMPLANT
BATTERY INSTRU NAVIGATION (MISCELLANEOUS) ×3 IMPLANT
BLADE SAW RECIPROCATING 77.5 (BLADE) ×1 IMPLANT
BNDG CMPR 5X4 KNIT ELC UNQ LF (GAUZE/BANDAGES/DRESSINGS) ×1
BNDG CMPR 6 X 5 YARDS HK CLSR (GAUZE/BANDAGES/DRESSINGS) ×1
BNDG CMPR MED 10X6 ELC LF (GAUZE/BANDAGES/DRESSINGS) ×1
BNDG ELASTIC 4INX 5YD STR LF (GAUZE/BANDAGES/DRESSINGS) ×1 IMPLANT
BNDG ELASTIC 6INX 5YD STR LF (GAUZE/BANDAGES/DRESSINGS) ×1 IMPLANT
BNDG ELASTIC 6X10 VLCR STRL LF (GAUZE/BANDAGES/DRESSINGS) IMPLANT
BTRY SRG DRVR LF (MISCELLANEOUS) ×3
CHLORAPREP W/TINT 26 (MISCELLANEOUS) ×2 IMPLANT
COMP FEM CMT PS KNEE 9 RT (Joint) ×1 IMPLANT
COMP PATELLA 3 PEG 35 (Joint) ×1 IMPLANT
COMPONENT FEM CMT PS KNEE 9 RT (Joint) IMPLANT
COMPONENT PATELLA 3 PEG 35 (Joint) IMPLANT
COVER SURGICAL LIGHT HANDLE (MISCELLANEOUS) ×1 IMPLANT
DERMABOND ADVANCED .7 DNX12 (GAUZE/BANDAGES/DRESSINGS) ×2 IMPLANT
DRAPE SHEET LG 3/4 BI-LAMINATE (DRAPES) ×3 IMPLANT
DRAPE U-SHAPE 47X51 STRL (DRAPES) ×1 IMPLANT
DRSG AQUACEL AG ADV 3.5X10 (GAUZE/BANDAGES/DRESSINGS) ×1 IMPLANT
ELECT BLADE TIP CTD 4 INCH (ELECTRODE) ×1 IMPLANT
ELECT REM PT RETURN 15FT ADLT (MISCELLANEOUS) ×1 IMPLANT
GAUZE SPONGE 4X4 12PLY STRL (GAUZE/BANDAGES/DRESSINGS) ×1 IMPLANT
GLOVE BIO SURGEON STRL SZ7 (GLOVE) ×1 IMPLANT
GLOVE BIO SURGEON STRL SZ8.5 (GLOVE) ×2 IMPLANT
GLOVE BIOGEL PI IND STRL 7.5 (GLOVE) ×1 IMPLANT
GLOVE BIOGEL PI IND STRL 8.5 (GLOVE) ×1 IMPLANT
GOWN SPEC L3 XXLG W/TWL (GOWN DISPOSABLE) ×1 IMPLANT
GOWN STRL REUS W/ TWL XL LVL3 (GOWN DISPOSABLE) ×1 IMPLANT
GOWN STRL REUS W/TWL XL LVL3 (GOWN DISPOSABLE) ×1
HANDPIECE INTERPULSE COAX TIP (DISPOSABLE) ×1
HOLDER FOLEY CATH W/STRAP (MISCELLANEOUS) ×1 IMPLANT
HOOD PEEL AWAY T7 (MISCELLANEOUS) ×3 IMPLANT
INSERT TIB AS PS EF/3-11 10 RT (Insert) IMPLANT
KIT TURNOVER KIT A (KITS) IMPLANT
MARKER SKIN DUAL TIP RULER LAB (MISCELLANEOUS) ×1 IMPLANT
NDL SAFETY ECLIP 18X1.5 (MISCELLANEOUS) ×1 IMPLANT
NDL SPNL 18GX3.5 QUINCKE PK (NEEDLE) ×1 IMPLANT
NEEDLE SPNL 18GX3.5 QUINCKE PK (NEEDLE) ×1 IMPLANT
NS IRRIG 1000ML POUR BTL (IV SOLUTION) ×1 IMPLANT
PACK TOTAL KNEE CUSTOM (KITS) ×1 IMPLANT
PADDING CAST COTTON 6X4 STRL (CAST SUPPLIES) ×1 IMPLANT
PIN DRILL HDLS TROCAR 75 4PK (PIN) IMPLANT
PROS TIB KNEE PS 0D F RT (Joint) ×1 IMPLANT
PROSTHESIS TIB KNEE PS 0D F RT (Joint) IMPLANT
PROTECTOR NERVE ULNAR (MISCELLANEOUS) ×1 IMPLANT
SAW OSC TIP CART 19.5X105X1.3 (SAW) ×1 IMPLANT
SCREW FEMALE HEX FIX 25X2.5 (ORTHOPEDIC DISPOSABLE SUPPLIES) IMPLANT
SEALER BIPOLAR AQUA 6.0 (INSTRUMENTS) ×1 IMPLANT
SET HNDPC FAN SPRY TIP SCT (DISPOSABLE) ×1 IMPLANT
SET PAD KNEE POSITIONER (MISCELLANEOUS) ×1 IMPLANT
SOLUTION PRONTOSAN WOUND 350ML (IRRIGATION / IRRIGATOR) IMPLANT
SPIKE FLUID TRANSFER (MISCELLANEOUS) ×2 IMPLANT
SUT MNCRL AB 3-0 PS2 18 (SUTURE) ×1 IMPLANT
SUT MON AB 2-0 CT1 36 (SUTURE) ×1 IMPLANT
SUT STRATAFIX PDO 1 14 VIOLET (SUTURE) ×1
SUT STRATFX PDO 1 14 VIOLET (SUTURE) ×1
SUT VIC AB 1 CTX 36 (SUTURE) ×2
SUT VIC AB 1 CTX36XBRD ANBCTR (SUTURE) ×2 IMPLANT
SUT VIC AB 2-0 CT1 27 (SUTURE) ×1
SUT VIC AB 2-0 CT1 TAPERPNT 27 (SUTURE) ×1 IMPLANT
SUTURE STRATFX PDO 1 14 VIOLET (SUTURE) ×1 IMPLANT
SYR 3ML LL SCALE MARK (SYRINGE) ×1 IMPLANT
TRAY FOLEY MTR SLVR 16FR STAT (SET/KITS/TRAYS/PACK) IMPLANT
TUBE SUCTION HIGH CAP CLEAR NV (SUCTIONS) ×1 IMPLANT
WATER STERILE IRR 1000ML POUR (IV SOLUTION) ×2 IMPLANT
WRAP KNEE MAXI GEL POST OP (GAUZE/BANDAGES/DRESSINGS) IMPLANT

## 2023-09-01 NOTE — Discharge Instructions (Signed)
 Dr. Brian Swinteck Total Joint Specialist [facility] [address] [place], [place] [address] [phone]  TOTAL KNEE REPLACEMENT POSTOPERATIVE DIRECTIONS    Knee Rehabilitation, Guidelines Following Surgery  Results after knee surgery are often greatly improved when you follow the exercise, range of motion and muscle strengthening exercises prescribed by your doctor. Safety measures are also important to protect the knee from further injury. Any time any of these exercises cause you to have increased pain or swelling in your knee joint, decrease the amount until you are comfortable again and slowly increase them. If you have problems or questions, call your caregiver or physical therapist for advice.   WEIGHT BEARING Weight bearing as tolerated with assist device (walker, cane, etc) as directed, use it as long as suggested by your surgeon or therapist, typically at least 4-6 weeks.  HOME CARE INSTRUCTIONS  Remove items at home which could result in a fall. This includes throw rugs or furniture in walking pathways.  Continue medications as instructed at time of discharge. You may have some home medications which will be placed on hold until you complete the course of blood thinner medication.  You may start showering once you are discharged home but do not submerge the incision under water. Just pat the incision dry and apply a dry gauze dressing on daily. Walk with walker as instructed.  You may resume a sexual relationship in one month or when given the OK by your doctor.  Use walker as long as suggested by your caregivers. Avoid periods of inactivity such as sitting longer than an hour when not asleep. This helps prevent blood clots.  You may put full weight on your legs and walk as much as is comfortable.  You may return to work once you are cleared by your doctor.  Do not drive a car for 6 weeks or until released by you surgeon.  Do not drive while  taking narcotics.  Wear the elastic stockings for three weeks following surgery during the day but you may remove then at night. Make sure you keep all of your appointments after your operation with all of your doctors and caregivers. You should call the office at the above phone number and make an appointment for approximately two weeks after the date of your surgery. Do not remove your surgical dressing. The dressing is waterproof; you may take showers in 3 days, but do not take tub baths or submerge the dressing. Please pick up a stool softener and laxative for home use as long as you are requiring pain medications. ICE to the affected knee every three hours for 30 minutes at a time and then as needed for pain and swelling.  Continue to use ice on the knee for pain and swelling from surgery. You may notice swelling that will progress down to the foot and ankle.  This is normal after surgery.  Elevate the leg when you are not up walking on it.   It is important for you to complete the blood thinner medication as prescribed by your doctor. Continue to use the breathing machine which will help keep your temperature down.  It is common for your temperature to cycle up and down following surgery, especially at night when you are not up moving around and exerting yourself.  The breathing machine keeps your lungs expanded and your temperature down.  RANGE OF MOTION AND STRENGTHENING EXERCISES  Rehabilitation of the knee is important following a knee injury or an   operation. After just a few days of immobilization, the muscles of the thigh which control the knee become weakened and shrink (atrophy). Knee exercises are designed to build up the tone and strength of the thigh muscles and to improve knee motion. Often times heat used for twenty to thirty minutes before working out will loosen up your tissues and help with improving the range of motion but do not use heat for the first two weeks following surgery.  These exercises can be done on a training (exercise) mat, on the floor, on a table or on a bed. Use what ever works the best and is most comfortable for you Knee exercises include:  Leg Lifts - While your knee is still immobilized in a splint or cast, you can do straight leg raises. Lift the leg to 60 degrees, hold for 3 sec, and slowly lower the leg. Repeat 10-20 times 2-3 times daily. Perform this exercise against resistance later as your knee gets better.  Quad and Hamstring Sets - Tighten up the muscle on the front of the thigh (Quad) and hold for 5-10 sec. Repeat this 10-20 times hourly. Hamstring sets are done by pushing the foot backward against an object and holding for 5-10 sec. Repeat as with quad sets.  A rehabilitation program following serious knee injuries can speed recovery and prevent re-injury in the future due to weakened muscles. Contact your doctor or a physical therapist for more information on knee rehabilitation.   POST-OPERATIVE OPIOID TAPER INSTRUCTIONS: It is important to wean off of your opioid medication as soon as possible. If you do not need pain medication after your surgery it is ok to stop day one. Opioids include: Codeine, Hydrocodone(Norco, Vicodin), Oxycodone(Percocet, oxycontin) and hydromorphone amongst others.  Long term and even short term use of opiods can cause: Increased pain response Dependence Constipation Depression Respiratory depression And more.  Withdrawal symptoms can include Flu like symptoms Nausea, vomiting And more Techniques to manage these symptoms Hydrate well Eat regular healthy meals Stay active Use relaxation techniques(deep breathing, meditating, yoga) Do Not substitute Alcohol to help with tapering If you have been on opioids for less than two weeks and do not have pain than it is ok to stop all together.  Plan to wean off of opioids This plan should start within one week post op of your joint replacement. Maintain the same  interval or time between taking each dose and first decrease the dose.  Cut the total daily intake of opioids by one tablet each day Next start to increase the time between doses. The last dose that should be eliminated is the evening dose.    SKILLED REHAB INSTRUCTIONS: If the patient is transferred to a skilled rehab facility following release from the hospital, a list of the current medications will be sent to the facility for the patient to continue.  When discharged from the skilled rehab facility, please have the facility set up the patient's Home Health Physical Therapy prior to being released. Also, the skilled facility will be responsible for providing the patient with their medications at time of release from the facility to include their pain medication, the muscle relaxants, and their blood thinner medication. If the patient is still at the rehab facility at time of the two week follow up appointment, the skilled rehab facility will also need to assist the patient in arranging follow up appointment in our office and any transportation needs.  MAKE SURE YOU:  Understand these instructions.  Will watch   your condition.  Will get help right away if you are not doing well or get worse.    Pick up stool softner and laxative for home use following surgery while on pain medications. Do NOT remove your dressing. You may shower.  Do not take tub baths or submerge incision under water. May shower starting three days after surgery. Please use a clean towel to pat the incision dry following showers. Continue to use ice for pain and swelling after surgery. Do not use any lotions or creams on the incision until instructed by your surgeon.  

## 2023-09-01 NOTE — Evaluation (Signed)
Physical Therapy Evaluation Patient Details Name: Heather Hoover MRN: [id number] DOB: 1946/07/07 Today's Date: 09/01/2023  History of Present Illness  77 yo female presents to therapy s/p R TKA on 09/01/2023 due to failure of conservative measures. 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  Clinical Impression      LAKAN EASTMAN is a 77 y.o. female POD 0 s/p R TKA. Patient reports IND with mobility at baseline. Patient is now limited by functional impairments (see PT problem list below) and requires S for bed mobility and CGA and cues for transfers. Patient was able to ambulate 55 feet with RW and CGA level of assist. Patient instructed in exercise to facilitate ROM and circulation to manage edema.  Patient will benefit from continued skilled PT interventions to address impairments and progress towards PLOF. Acute PT will follow to progress mobility and stair training in preparation for safe discharge home with family support and OPPT services scheduled for 9/16.     If plan is discharge home, recommend the following: A little help with walking and/or transfers;A little help with bathing/dressing/bathroom;Assistance with cooking/housework;Assist for transportation;Help with stairs or ramp for entrance   Can travel by private vehicle        Equipment Recommendations None recommended by PT  Recommendations for Other Services       Functional Status Assessment Patient has had a recent decline in their functional status and demonstrates the ability to make significant improvements in function in a reasonable and predictable amount of time.     Precautions / Restrictions Precautions Precautions: Knee;Fall Restrictions Weight Bearing Restrictions: No      Mobility  Bed Mobility Overal bed mobility: Needs Assistance Bed Mobility: Supine to Sit      Supine to sit: Supervision, HOB elevated     General bed mobility comments: min cues    Transfers Overall transfer level: Needs assistance Equipment used: Rolling walker (2 wheels) Transfers: Sit to/from Stand Sit to Stand: Contact guard assist, From elevated surface           General transfer comment: min cues for safety and slowing down    Ambulation/Gait Ambulation/Gait assistance: Contact guard assist Gait Distance (Feet): 55 Feet Assistive device: Rolling walker (2 wheels) Gait Pattern/deviations: Step-to pattern, Antalgic Gait velocity: decreased     General Gait Details: cues for posture and proper distance from RW  Stairs            Wheelchair Mobility     Tilt Bed    Modified Rankin (Stroke Patients Only)       Balance Overall balance assessment: Needs assistance Sitting-balance support: Feet supported Sitting balance-Leahy Scale: Good     Standing balance support: Bilateral upper extremity supported, During functional activity, Reliant on assistive device for balance Standing balance-Leahy Scale: Poor                               Pertinent Vitals/Pain Pain Assessment Pain Assessment: 0-10 Pain Score: 5  Pain Location: R knee Pain Descriptors / Indicators: Aching, Constant, Discomfort, Operative site guarding Pain Intervention(s): Limited activity within patient's tolerance, Monitored during session, Premedicated before session, Repositioned, Ice applied    Home Living Family/patient expects to be discharged to:: Private residence Living Arrangements: Spouse/significant other Available Help at Discharge: Family Type of Home: House Home  Access: Stairs to enter Entrance Stairs-Rails: None Entrance Stairs-Number of Steps: 1 + 1 (2 non sequential steps) Alternate Level Stairs-Number of Steps: 7 (to access B and B) Home Layout: Multi-level Home Equipment: Pharmacist, hospital (2 wheels);Toilet riser (HH shower  head) Additional Comments: ice man machine    Prior Function Prior Level of Function : Independent/Modified Independent;Driving             Mobility Comments: no AD IND with all ADLs self care tasks ADLs Comments: active and going to gym     Extremity/Trunk Assessment        Lower Extremity Assessment Lower Extremity Assessment: RLE deficits/detail RLE Deficits / Details: ankle DF./PF 5/5; SLR < 10 degree lag RLE Sensation: WNL    Cervical / Trunk Assessment Cervical / Trunk Assessment: Normal  Communication   Communication Communication: No apparent difficulties  Cognition Arousal: Alert Behavior During Therapy: WFL for tasks assessed/performed Overall Cognitive Status: Within Functional Limits for tasks assessed                                          General Comments      Exercises Total Joint Exercises Ankle Circles/Pumps: AROM, Both, 15 reps   Assessment/Plan    PT Assessment Patient needs continued PT services  PT Problem List Decreased strength;Decreased range of motion;Decreased activity tolerance;Decreased balance;Decreased mobility;Decreased coordination;Pain       PT Treatment Interventions DME instruction;Gait training;Stair training;Functional mobility training;Therapeutic activities;Therapeutic exercise;Balance training;Neuromuscular re-education;Patient/family education;Modalities    PT Goals (Current goals can be found in the Care Plan section)  Acute Rehab PT Goals Patient Stated Goal: to be able to walk in the neighborhood, keep up and go out with my friends PT Goal Formulation: With patient Time For Goal Achievement: 09/15/23    Frequency 7X/week     Co-evaluation               AM-PAC PT "6 Clicks" Mobility  Outcome Measure Help needed turning from your back to your side while in a flat bed without using bedrails?: None Help needed moving from lying on your back to sitting on the side of a flat bed without  using bedrails?: None Help needed moving to and from a bed to a chair (including a wheelchair)?: A Little Help needed standing up from a chair using your arms (e.g., wheelchair or bedside chair)?: A Little Help needed to walk in hospital room?: A Little Help needed climbing 3-5 steps with a railing? : A Lot 6 Click Score: 19    End of Session Equipment Utilized During Treatment: Gait belt Activity Tolerance: Patient tolerated treatment well;No increased pain Patient left: in chair;with call bell/phone within reach;with chair alarm set Nurse Communication: Mobility status PT Visit Diagnosis: Unsteadiness on feet (R26.81);Other abnormalities of gait and mobility (R26.89);Muscle weakness (generalized) (M62.81);Difficulty in walking, not elsewhere classified (R26.2);Pain Pain - Right/Left: Right Pain - part of body: Knee;Leg    Time: 0981-1914 PT Time Calculation (min) (ACUTE ONLY): 24 min   Charges:   PT Evaluation $PT Eval Low Complexity: 1 Low PT Treatments $Gait Training: 8-22 mins PT General Charges $$ ACUTE PT VISIT: 1 Visit         Johnny Bridge, PT Acute Rehab   Jacqualyn Posey 09/01/2023, 5:57 PM

## 2023-09-01 NOTE — Anesthesia Procedure Notes (Signed)
Date/Time: 09/01/2023 8:35 AM  Performed by: Shary Decamp, CRNAPre-anesthesia Checklist: Patient identified, Emergency Drugs available, Suction available, Timeout performed and Patient being monitored Patient Re-evaluated:Patient Re-evaluated prior to induction Oxygen Delivery Method: Simple face mask

## 2023-09-01 NOTE — Interval H&P Note (Signed)
History and Physical Interval Note:  09/01/2023 8:11 AM  Heather Hoover  has presented today for surgery, with the diagnosis of Right knee osteoarthritis.  The various methods of treatment have been discussed with the patient and family. After consideration of risks, benefits and other options for treatment, the patient has consented to  Procedure(s) with comments: COMPUTER ASSISTED TOTAL KNEE ARTHROPLASTY (Right) - 160 as a surgical intervention.  The patient's history has been reviewed, patient examined, no change in status, stable for surgery.  I have reviewed the patient's chart and labs.  Questions were answered to the patient's satisfaction.     Heather Hoover

## 2023-09-01 NOTE — Op Note (Signed)
OPERATIVE REPORT  SURGEON: Samson Frederic, MD   ASSISTANT: Clint Bolder, PA-C  PREOPERATIVE DIAGNOSIS: Primary Right knee arthritis.   POSTOPERATIVE DIAGNOSIS: Primary Right knee arthritis.   PROCEDURE: Computer assisted Right total knee arthroplasty.   IMPLANTS: Zimmer Persona PPS Cementless CR femur, size 9 narrow. Persona 0 degree Spiked Keel OsseoTi Tibia, size F. Vivacit-E polyethelyene insert, size 10 mm, CR. OsseoTi 3-Peg patella, size 35 mm.  ANESTHESIA:  MAC, Regional, and Spinal  TOURNIQUET TIME: Not utilized.   ESTIMATED BLOOD LOSS:-150 mL    ANTIBIOTICS: 2g Ancef.  DRAINS: None.  COMPLICATIONS: None   CONDITION: PACU - hemodynamically stable.   BRIEF CLINICAL NOTE: Heather Hoover is a 77 y.o. female with a long-standing history of Right knee arthritis. After failing conservative management, the patient was indicated for total knee arthroplasty. The risks, benefits, and alternatives to the procedure were explained, and the patient elected to proceed.  PROCEDURE IN DETAIL: Adductor canal block was obtained in the pre-op holding area. Once inside the operative room, spinal anesthesia was obtained, and a foley catheter was inserted. The patient was then positioned and the lower extremity was prepped and draped in the normal sterile surgical fashion.  A time-out was called verifying side and site of surgery. The patient received IV antibiotics within 60 minutes of beginning the procedure. A tourniquet was not utilized.   An anterior approach to the knee was performed utilizing a midvastus arthrotomy. A medial release was performed and the patellar fat pad was excised. Stryker imageless navigation was used to cut the distal femur perpendicular to the mechanical axis. A freehand patellar resection was performed, and the patella was sized and prepared with 3 lug holes.  Nagivation was used to make a neutral proximal tibia resection, taking 9 mm of bone from the less  affected lateral side with 3 degrees of slope. The menisci were excised. A spacer block was placed, and the alignment and balance in extension were confirmed.   The distal femur was sized using the 3-degree external rotation guide referencing the posterior femoral cortex. The appropriate 4-in-1 cutting block was pinned into place. Rotation was checked using Whiteside's line, the epicondylar axis, and then confirmed with a spacer block in flexion. The remaining femoral cuts were performed, taking care to protect the MCL.  The tibia was sized and the trial tray was pinned into place. The remaining trail components were inserted. The knee was stable to varus and valgus stress through a full range of motion. The patella tracked centrally, and the PCL was well balanced. The trial components were removed, and the proximal tibial surface was prepared. Final components were impacted into place. The knee was tested for a final time and found to be well balanced.   The wound was copiously irrigated with Prontosan solution and normal saline using pulse lavage.  Marcaine solution was injected into the periarticular soft tissue.  The wound was closed in layers using #1 Vicryl and Stratafix for the fascia, 2-0 Vicryl for the subcutaneous fat, 2-0 Monocryl for the deep dermal layer, 3-0 running Monocryl subcuticular Stitch, and 4-0 Monocryl stay sutures at both ends of the wound. Dermabond was applied to the skin.  Once the glue was fully dried, an Aquacell Ag and compressive dressing were applied.  The patient was transported to the recovery room in stable condition.  Sponge, needle, and instrument counts were correct at the end of the case x2.  The patient tolerated the procedure well and there were no known  complications.  The aquamantis was utilized for this case to help facilitate better hemostasis as patient was felt to be at increased risk of bleeding because of complex case requiring increased OR time and/or  exposure.  -minimally invasive approach.  A oscillating saw tip was utilized for this case to prevent damage to the soft tissue structures such as muscles, ligaments and tendons, and to ensure accurate bone cuts. This patient was at increased risk for above structures due to  minimally invasive approach.  Please note that a surgical assistant was a medical necessity for this procedure in order to perform it in a safe and expeditious manner. Surgical assistant was necessary to retract the ligaments and vital neurovascular structures to prevent injury to them and also necessary for proper positioning of the limb to allow for anatomic placement of the prosthesis.

## 2023-09-01 NOTE — Anesthesia Postprocedure Evaluation (Addendum)
Anesthesia Post Note  Patient: Heather Hoover  Procedure(s) Performed: COMPUTER ASSISTED TOTAL KNEE ARTHROPLASTY (Right: Knee)     Patient location during evaluation: PACU Anesthesia Type: Spinal Level of consciousness: oriented and awake and alert Pain management: pain level controlled Vital Signs Assessment: post-procedure vital signs reviewed and stable Respiratory status: spontaneous breathing, respiratory function stable and patient connected to nasal cannula oxygen Cardiovascular status: blood pressure returned to baseline and stable Postop Assessment: no headache, no backache and no apparent nausea or vomiting Anesthetic complications: no  No notable events documented.  Last Vitals:  Vitals:   09/01/23 1215 09/01/23 1257  BP: 138/69 (!) 156/55  Pulse: 63 63  Resp: 19 20  Temp:  36.6 C  SpO2: 100% 100%                 Shelton Silvas

## 2023-09-01 NOTE — Anesthesia Preprocedure Evaluation (Addendum)
Anesthesia Evaluation  Patient identified by MRN, date of birth, ID band Patient awake    Reviewed: Allergy & Precautions, NPO status , Patient's Chart, lab work & pertinent test results  Airway Mallampati: II  TM Distance: >3 FB Neck ROM: Full    Dental  (+) Teeth Intact, Dental Advisory Given   Pulmonary    breath sounds clear to auscultation       Cardiovascular hypertension, Pt. on medications and Pt. on home beta blockers  Rhythm:Regular Rate:Normal + Systolic murmurs Echo: 1. Left ventricular ejection fraction, by estimation, is 60 to 65%. The  left ventricle has normal function. The left ventricle has no regional  wall motion abnormalities. Left ventricular diastolic parameters were  normal.   2. Right ventricular systolic function is normal. The right ventricular  size is normal.   3. Mild mitral valve regurgitation.   4. The aortic valve is tricuspid. Aortic valve regurgitation is mild.  Aortic valve sclerosis is present, with no evidence of aortic valve  stenosis.   5. PV is thickened, difficult to see. Peak and mean gradients through the  valve are 15 and 8 mm Hg respectively. No significant change from echo  report in 2020.     Neuro/Psych  Headaches  negative psych ROS   GI/Hepatic Neg liver ROS,GERD  ,,  Endo/Other  negative endocrine ROS    Renal/GU negative Renal ROS     Musculoskeletal  (+) Arthritis ,    Abdominal   Peds  Hematology negative hematology ROS (+)   Anesthesia Other Findings   Reproductive/Obstetrics                             Anesthesia Physical Anesthesia Plan  ASA: 2  Anesthesia Plan: Spinal   Post-op Pain Management: Regional block*   Induction: Intravenous  PONV Risk Score and Plan: 3 and Ondansetron, Dexamethasone and Propofol infusion  Airway Management Planned: Natural Airway  Additional Equipment: None  Intra-op Plan:    Post-operative Plan:   Informed Consent: I have reviewed the patients History and Physical, chart, labs and discussed the procedure including the risks, benefits and alternatives for the proposed anesthesia with the patient or authorized representative who has indicated his/her understanding and acceptance.       Plan Discussed with: CRNA  Anesthesia Plan Comments: (Lab Results      Component                Value               Date                      WBC                      4.9                 08/19/2023                HGB                      12.7                08/19/2023                HCT                      38.9  08/19/2023                MCV                      97.7                08/19/2023                PLT                      189                 08/19/2023           )       Anesthesia Quick Evaluation

## 2023-09-01 NOTE — Transfer of Care (Signed)
Immediate Anesthesia Transfer of Care Note  Patient: Heather Hoover  Procedure(s) Performed: COMPUTER ASSISTED TOTAL KNEE ARTHROPLASTY (Right: Knee)  Patient Location: PACU  Anesthesia Type:Spinal  Level of Consciousness: drowsy and patient cooperative  Airway & Oxygen Therapy: Patient Spontanous Breathing and Patient connected to face mask oxygen  Post-op Assessment: Report given to RN and Post -op Vital signs reviewed and stable  Post vital signs: Reviewed and stable  Last Vitals:  Vitals Value Taken Time  BP 136/111 09/01/23 1111  Temp    Pulse 62 09/01/23 1113  Resp 15 09/01/23 1113  SpO2 100 % 09/01/23 1113  Vitals shown include unfiled device data.  Last Pain:  Vitals:   09/01/23 0713  TempSrc: Oral  PainSc:          Complications: No notable events documented.

## 2023-09-01 NOTE — Anesthesia Procedure Notes (Signed)
Spinal  Start time: 09/01/2023 8:43 AM End time: 09/01/2023 8:48 AM Reason for block: surgical anesthesia Staffing Performed: anesthesiologist  Anesthesiologist: Shelton Silvas, MD Performed by: Shelton Silvas, MD Authorized by: Shelton Silvas, MD   Preanesthetic Checklist Completed: patient identified, IV checked, site marked, risks and benefits discussed, surgical consent, monitors and equipment checked, pre-op evaluation and timeout performed Spinal Block Patient position: sitting Prep: DuraPrep and site prepped and draped Location: L3-4 Injection technique: single-shot Needle Needle type: Pencan  Needle gauge: 24 G Needle length: 10 cm Needle insertion depth: 10 cm Additional Notes 2 attempts due to severe scoliosis.   Patient tolerated well. No immediate complications.  Functioning IV was confirmed and monitors were applied. Sterile prep and drape, including hand hygiene and sterile gloves were used. The patient was positioned and the back was prepped. The skin was anesthetized with lidocaine. Free flow of clear CSF was obtained prior to injecting local anesthetic into the CSF. The spinal needle aspirated freely following injection. The needle was carefully withdrawn. The patient tolerated the procedure well.

## 2023-09-02 ENCOUNTER — Encounter (HOSPITAL_COMMUNITY): Payer: Self-pay | Admitting: Orthopedic Surgery

## 2023-09-02 LAB — CBC
HCT: 29.9 % — ABNORMAL LOW (ref 36.0–46.0)
Hemoglobin: 9.7 g/dL — ABNORMAL LOW (ref 12.0–15.0)
MCH: 32.1 pg (ref 26.0–34.0)
MCHC: 32.4 g/dL (ref 30.0–36.0)
MCV: 99 fL (ref 80.0–100.0)
Platelets: 116 10*3/uL — ABNORMAL LOW (ref 150–400)
RBC: 3.02 MIL/uL — ABNORMAL LOW (ref 3.87–5.11)
RDW: 12.3 % (ref 11.5–15.5)
WBC: 5.1 10*3/uL (ref 4.0–10.5)
nRBC: 0 % (ref 0.0–0.2)

## 2023-09-02 LAB — BASIC METABOLIC PANEL
Anion gap: 7 (ref 5–15)
BUN: 11 mg/dL (ref 8–23)
CO2: 27 mmol/L (ref 22–32)
Calcium: 8.3 mg/dL — ABNORMAL LOW (ref 8.9–10.3)
Chloride: 97 mmol/L — ABNORMAL LOW (ref 98–111)
Creatinine, Ser: 0.56 mg/dL (ref 0.44–1.00)
GFR, Estimated: >60 mL/min (ref >60)
Glucose, Bld: 121 mg/dL — ABNORMAL HIGH (ref 70–99)
Potassium: 3.7 mmol/L (ref 3.5–5.1)
Sodium: 131 mmol/L — ABNORMAL LOW (ref 135–145)

## 2023-09-02 MED ORDER — ASPIRIN 81 MG PO CHEW
81.0000 mg | CHEWABLE_TABLET | Freq: Two times a day (BID) | ORAL | 0 refills | Status: DC
Start: 2023-09-02 — End: 2023-09-13

## 2023-09-02 MED ORDER — OXYCODONE HCL 5 MG PO TABS: 5.0000 mg | ORAL_TABLET | ORAL | 0 refills | Status: DC | PRN

## 2023-09-02 MED ORDER — SENNA 8.6 MG PO TABS
2.0000 | ORAL_TABLET | Freq: Every day | ORAL | 0 refills | Status: DC
Start: 2023-09-02 — End: 2023-09-13

## 2023-09-02 MED ORDER — DOCUSATE SODIUM 100 MG PO CAPS
100.0000 mg | ORAL_CAPSULE | Freq: Two times a day (BID) | ORAL | 0 refills | Status: DC
Start: 2023-09-02 — End: 2023-09-13

## 2023-09-02 MED ORDER — POLYETHYLENE GLYCOL 3350 17 G PO PACK: 17.0000 g | PACK | Freq: Every day | ORAL | 0 refills | Status: DC | PRN

## 2023-09-02 MED ORDER — ONDANSETRON HCL 4 MG PO TABS: 4.0000 mg | ORAL_TABLET | Freq: Three times a day (TID) | ORAL | 0 refills | Status: DC | PRN

## 2023-09-02 NOTE — Progress Notes (Signed)
Physical Therapy Treatment Patient Details Name: Heather Hoover MRN: [id number] DOB: 1946/08/22 Today's Date: 09/02/2023   History of Present Illness 77 yo female presents to therapy s/p R TKA on 09/01/2023 due to failure of conservative measures. 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    PT Comments  Pt very cooperative and therex program performed.  OOB deferred to later at pt request - pt reports dizziness and nausea (now resolved) with recent transfer to/from [facility] and would like to rest a little longer prior to attempting OOB.    If plan is discharge home, recommend the following: A little help with walking and/or transfers;A little help with bathing/dressing/bathroom;Assistance with cooking/housework;Assist for transportation;Help with stairs or ramp for entrance   Can travel by private vehicle        Equipment Recommendations  None recommended by PT    Recommendations for Other Services       Precautions / Restrictions Precautions Precautions: Knee;Fall Restrictions Weight Bearing Restrictions: No     Mobility  Bed Mobility               General bed mobility comments: OOB deferred at pt request    Transfers                        Ambulation/Gait                   Stairs             Wheelchair Mobility     Tilt Bed    Modified Rankin (Stroke Patients Only)       Balance                                            Cognition Arousal: Alert Behavior During Therapy: WFL for tasks assessed/performed Overall Cognitive Status: Within Functional Limits for tasks assessed                                          Exercises Total Joint Exercises Ankle Circles/Pumps: AROM, Both, 15 reps Quad Sets: AROM, Both, 10 reps, Supine Heel Slides: AAROM, Right, 15 reps,  Supine Straight Leg Raises: AAROM, AROM, Right, 15 reps, Supine Goniometric ROM: AAROM at R knee -5 - 70    General Comments        Pertinent Vitals/Pain Pain Assessment Pain Assessment: 0-10 Pain Score: 5  Pain Location: R knee Pain Descriptors / Indicators: Aching, Constant, Discomfort, Operative site guarding Pain Intervention(s): Limited activity within patient's tolerance, Monitored during session, Premedicated before session, Ice applied    Home Living                          Prior Function            PT Goals (current goals can now be found in the care plan section) Acute Rehab PT Goals Patient Stated Goal: to be able to walk in the neighborhood, keep up and go out with my friends PT Goal Formulation: With patient Time For Goal Achievement: 09/15/23 Progress towards PT goals: Progressing toward goals  Frequency    7X/week      PT Plan      Co-evaluation              AM-PAC PT "6 Clicks" Mobility   Outcome Measure  Help needed turning from your back to your side while in a flat bed without using bedrails?: None Help needed moving from lying on your back to sitting on the side of a flat bed without using bedrails?: None Help needed moving to and from a bed to a chair (including a wheelchair)?: A Little Help needed standing up from a chair using your arms (e.g., wheelchair or bedside chair)?: A Little Help needed to walk in hospital room?: A Little Help needed climbing 3-5 steps with a railing? : A Lot 6 Click Score: 19    End of Session Equipment Utilized During Treatment: Gait belt Activity Tolerance: Patient tolerated treatment well;No increased pain Patient left: in bed;with call bell/phone within reach;with family/visitor present   PT Visit Diagnosis: Unsteadiness on feet (R26.81);Other abnormalities of gait and mobility (R26.89);Muscle weakness (generalized) (M62.81);Difficulty in walking, not elsewhere classified  (R26.2);Pain Pain - Right/Left: Right Pain - part of body: Knee;Leg     Time: 1191-4782 PT Time Calculation (min) (ACUTE ONLY): 19 min  Charges:    $Therapeutic Exercise: 8-22 mins PT General Charges $$ ACUTE PT VISIT: 1 Visit                     Mauro Kaufmann PT Acute Rehabilitation Services Pager [phone] Office [phone]    [facility] 09/02/2023, 12:58 PM

## 2023-09-02 NOTE — Care Management Obs Status (Signed)
MEDICARE OBSERVATION STATUS NOTIFICATION   Patient Details  Name: Heather Hoover MRN: [id number] Date of Birth: 09/19/1946   Medicare Observation Status Notification Given:  Yes    Ewing Schlein, LCSW 09/02/2023, 11:28 AM

## 2023-09-02 NOTE — Progress Notes (Signed)
Physical Therapy Treatment Patient Details Name: Heather Hoover MRN: [id number] DOB: 06/01/1946 Today's Date: 09/02/2023   History of Present Illness 77 yo female presents to therapy s/p R TKA on 09/01/2023 due to failure of conservative measures. 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    PT Comments  Pt continues very cooperative and up to ambulate limited distance in hall and negotiate stairs.  Pt unable to complete task 2* c/o fatigue and increasing dizziness.  BP supine 126/71; sit 131/71; stand 131/71; after walking limited distance in hall 81/36 RN aware.    If plan is discharge home, recommend the following: A little help with walking and/or transfers;A little help with bathing/dressing/bathroom;Assistance with cooking/housework;Assist for transportation;Help with stairs or ramp for entrance   Can travel by private vehicle        Equipment Recommendations  None recommended by PT    Recommendations for Other Services       Precautions / Restrictions Precautions Precautions: Knee;Fall Restrictions Weight Bearing Restrictions: No     Mobility  Bed Mobility Overal bed mobility: Needs Assistance Bed Mobility: Supine to Sit     Supine to sit: Supervision, HOB elevated     General bed mobility comments: Supervision for safety and minimal use of bed rail    Transfers Overall transfer level: Needs assistance Equipment used: Rolling walker (2 wheels) Transfers: Sit to/from Stand Sit to Stand: Contact guard assist, From elevated surface           General transfer comment: cues for LE management and use of UEs to self assist    Ambulation/Gait Ambulation/Gait assistance: Contact guard assist, Min assist Gait Distance (Feet): 68 Feet Assistive device: Rolling walker (2 wheels) Gait Pattern/deviations: Step-to pattern,  Antalgic Gait velocity: decreased     General Gait Details: cues for posture and proper distance from RW   Stairs Stairs: Yes Stairs assistance: Min assist Stair Management: No rails, Step to pattern, Forwards, With walker Number of Stairs: 1 General stair comments: single step with cues for sequence   Wheelchair Mobility     Tilt Bed    Modified Rankin (Stroke Patients Only)       Balance Overall balance assessment: Needs assistance Sitting-balance support: Feet supported Sitting balance-Leahy Scale: Good     Standing balance support: Bilateral upper extremity supported, During functional activity, Reliant on assistive device for balance Standing balance-Leahy Scale: Poor                              Cognition Arousal: Alert Behavior During Therapy: WFL for tasks assessed/performed Overall Cognitive Status: Within Functional Limits for tasks assessed                                          Exercises Total Joint Exercises Ankle Circles/Pumps: AROM, Both, 15 reps Quad Sets: AROM, Both, 10 reps, Supine Heel Slides: AAROM, Right, 15 reps, Supine Straight Leg Raises: AAROM, AROM, Right, 15 reps, Supine Goniometric ROM: AAROM at R knee -5 - 70    General Comments        Pertinent Vitals/Pain Pain Assessment Pain Assessment: 0-10 Pain Score: 5  Pain Location: R knee Pain Descriptors / Indicators: Aching, Constant, Discomfort, Operative site  guarding Pain Intervention(s): Limited activity within patient's tolerance, Monitored during session, Premedicated before session, Ice applied    Home Living                          Prior Function            PT Goals (current goals can now be found in the care plan section) Acute Rehab PT Goals Patient Stated Goal: to be able to walk in the neighborhood, keep up and go out with my friends PT Goal Formulation: With patient Time For Goal Achievement: 09/15/23 Progress  towards PT goals: Progressing toward goals    Frequency    7X/week      PT Plan      Co-evaluation              AM-PAC PT "6 Clicks" Mobility   Outcome Measure  Help needed turning from your back to your side while in a flat bed without using bedrails?: None Help needed moving from lying on your back to sitting on the side of a flat bed without using bedrails?: None Help needed moving to and from a bed to a chair (including a wheelchair)?: A Little Help needed standing up from a chair using your arms (e.g., wheelchair or bedside chair)?: A Little Help needed to walk in hospital room?: A Little Help needed climbing 3-5 steps with a railing? : A Little 6 Click Score: 20    End of Session Equipment Utilized During Treatment: Gait belt Activity Tolerance: Patient tolerated treatment well;No increased pain Patient left: in chair;with call bell/phone within reach;with family/visitor present Nurse Communication: Mobility status PT Visit Diagnosis: Unsteadiness on feet (R26.81);Other abnormalities of gait and mobility (R26.89);Muscle weakness (generalized) (M62.81);Difficulty in walking, not elsewhere classified (R26.2);Pain Pain - Right/Left: Right Pain - part of body: Knee;Leg     Time: 1610-9604 PT Time Calculation (min) (ACUTE ONLY): 24 min  Charges:    $Gait Training: 8-22 mins $Therapeutic Exercise: 8-22 mins $Therapeutic Activity: 8-22 mins PT General Charges $$ ACUTE PT VISIT: 1 Visit                     Mauro Kaufmann PT Acute Rehabilitation Services Pager [phone] Office [phone]    Romel Dumond 09/02/2023, 1:05 PM

## 2023-09-02 NOTE — TOC Transition Note (Signed)
Transition of Care [facility]) - CM/SW Discharge Note  Patient Details  Name: Heather Hoover MRN: [id number] Date of Birth: [birth year]  [facility]) CM/SW Contact:  Ewing Schlein, LCSW Phone Number: 09/02/2023, 10:44 AM  Clinical Narrative: Patient is expected to discharge home after working with PT. CSW met with patient to confirm discharge plan. Patient will go home with OPPT at [facility]. Patient has a rolling walker, raised toilet seat, and shower chair at home so there are no DME needs at this time. TOC signing off.    Final next level of care: OP Rehab Barriers to Discharge: No Barriers Identified  Patient Goals and CMS Choice Choice offered to / list presented to : NA  Discharge Plan and Services Additional resources added to the After Visit Summary for           DME Arranged: N/A DME Agency: NA  Social Determinants of Health (SDOH) Interventions SDOH Screenings   Food Insecurity: No Food Insecurity (09/01/2023)  Housing: Low Risk  (09/01/2023)  Transportation Needs: No Transportation Needs (09/01/2023)  Utilities: Not At Risk (09/01/2023)  Alcohol Screen: Low Risk  (07/18/2023)  Depression (PHQ2-9): Low Risk  (07/22/2023)  Financial Resource Strain: Low Risk  (07/18/2023)  Physical Activity: Insufficiently Active (07/18/2023)  Social Connections: Moderately Integrated (07/18/2023)  Stress: No Stress Concern Present (07/18/2023)  Tobacco Use: Low Risk  (09/01/2023)  Health Literacy: Adequate Health Literacy (07/22/2023)   Readmission Risk Interventions     No data to display

## 2023-09-02 NOTE — Progress Notes (Signed)
    Subjective:  Patient reports pain as mild to moderate.  Denies N/V/CP/SOB/Abd pain. She denies any tingling or numbness in LE bilaterally. Reports pain as well controlled.   Family at bedside.   Objective:   VITALS:   Vitals:   09/01/23 1750 09/01/23 2147 09/02/23 0152 09/02/23 0651  BP: (!) 122/55 137/65 116/60 127/64  Pulse: 72 69 74 81  Resp: 15 18 18 18   Temp: 97.6 F (36.4 C) 98.5 F (36.9 C) 98.6 F (37 C) 97.7 F (36.5 C)  TempSrc: Oral     SpO2: 99% 98% 99% 97%  Weight:      Height:        Patient is sitting up in bed. NAD.  Neurologically intact ABD soft Neurovascular intact Sensation intact distally Intact pulses distally Dorsiflexion/Plantar flexion intact Incision: dressing C/D/I No cellulitis present Compartment soft   Lab Results  Component Value Date   WBC 5.1 09/02/2023   HGB 9.7 (L) 09/02/2023   HCT 29.9 (L) 09/02/2023   MCV 99.0 09/02/2023   PLT 116 (L) 09/02/2023   BMET    Component Value Date/Time   NA 131 (L) 09/02/2023 0303   NA 137 10/23/2022 1017   K 3.7 09/02/2023 0303   CL 97 (L) 09/02/2023 0303   CO2 27 09/02/2023 0303   GLUCOSE 121 (H) 09/02/2023 0303   BUN 11 09/02/2023 0303   BUN 16 10/23/2022 1017   CREATININE 0.56 09/02/2023 0303   CREATININE 0.62 07/20/2023 0816   CALCIUM 8.3 (L) 09/02/2023 0303   EGFR 95 07/08/2023 1249   EGFR 92 10/23/2022 1017   GFRNONAA >60 09/02/2023 0303   GFRNONAA 89 04/13/2017 0820     Assessment/Plan: 1 Day Post-Op   Principal Problem:   Degenerative arthritis of right knee Active Problems:   S/P total knee arthroplasty, right  ABLA. Hemoglobin 9.7, continue to monitor.   WBAT with walker DVT ppx: Aspirin, SCDs, TEDS PO pain control PT/OT: Patient ambulated 55 feet with PT. Continue PT today.  Dispo: D/c home with OPPT once cleared with PT and voided.    Clois Dupes, PA-C 09/02/2023, 7:33 AM   [facility]  [place] [address], [address], [place], [place]  [address] Phone: [phone] [email or web address] Facebook  [employer]

## 2023-09-02 NOTE — Plan of Care (Signed)
  Problem: Education: Goal: Knowledge of the prescribed therapeutic regimen will improve Outcome: Progressing   Problem: Pain Management: Goal: Pain level will decrease with appropriate interventions Outcome: Progressing   Problem: Clinical Measurements: Goal: Will remain free from infection Outcome: Progressing

## 2023-09-03 LAB — CBC
HCT: 25.5 % — ABNORMAL LOW (ref 36.0–46.0)
Hemoglobin: 8.3 g/dL — ABNORMAL LOW (ref 12.0–15.0)
MCH: 31.8 pg (ref 26.0–34.0)
MCHC: 32.5 g/dL (ref 30.0–36.0)
MCV: 97.7 fL (ref 80.0–100.0)
Platelets: 108 10*3/uL — ABNORMAL LOW (ref 150–400)
RBC: 2.61 MIL/uL — ABNORMAL LOW (ref 3.87–5.11)
RDW: 12 % (ref 11.5–15.5)
WBC: 7.3 10*3/uL (ref 4.0–10.5)
nRBC: 0 % (ref 0.0–0.2)

## 2023-09-03 LAB — BASIC METABOLIC PANEL
Anion gap: 6 (ref 5–15)
BUN: 8 mg/dL (ref 8–23)
CO2: 26 mmol/L (ref 22–32)
Calcium: 7.9 mg/dL — ABNORMAL LOW (ref 8.9–10.3)
Chloride: 97 mmol/L — ABNORMAL LOW (ref 98–111)
Creatinine, Ser: 0.54 mg/dL (ref 0.44–1.00)
GFR, Estimated: >60 mL/min (ref >60)
Glucose, Bld: 124 mg/dL — ABNORMAL HIGH (ref 70–99)
Potassium: 3.6 mmol/L (ref 3.5–5.1)
Sodium: 129 mmol/L — ABNORMAL LOW (ref 135–145)

## 2023-09-03 NOTE — Progress Notes (Signed)
Physical Therapy Treatment Patient Details Name: Heather Hoover MRN: [id number] DOB: 20-Mar-1946 Today's Date: 09/03/2023   History of Present Illness 77 yo female presents to therapy s/p R TKA on 09/01/2023 due to failure of conservative measures. 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    PT Comments  Pt continues very cooperative but limited by fatigue and orthostatic hypotension.  BP supine110/89; sit 113/68, stand 101/80; and after walking short distance 91/72 with c/o increasing dizziness and nausea.    If plan is discharge home, recommend the following: A little help with walking and/or transfers;A little help with bathing/dressing/bathroom;Assistance with cooking/housework;Assist for transportation;Help with stairs or ramp for entrance   Can travel by private vehicle        Equipment Recommendations  None recommended by PT    Recommendations for Other Services       Precautions / Restrictions Precautions Precautions: Knee;Fall Restrictions Weight Bearing Restrictions: No RLE Weight Bearing: Weight bearing as tolerated     Mobility  Bed Mobility Overal bed mobility: Needs Assistance Bed Mobility: Sit to Supine     Supine to sit: Supervision, HOB elevated Sit to supine: Min assist   General bed mobility comments: cues for sequence with min assist for R LE management    Transfers Overall transfer level: Needs assistance Equipment used: Rolling walker (2 wheels) Transfers: Sit to/from Stand, Bed to chair/wheelchair/BSC Sit to Stand: Contact guard assist, From elevated surface   Step pivot transfers: Min assist       General transfer comment: cues for LE management and use of UEs to self assist; step pvt recliner to [facility]    Ambulation/Gait Ambulation/Gait assistance: Min assist Gait Distance (Feet): 24 Feet Assistive device:  Rolling walker (2 wheels) Gait Pattern/deviations: Step-to pattern, Antalgic Gait velocity: decreased     General Gait Details: cues for posture and proper distance from RW; distance ltd by orthostatic hypotension   Stairs             Wheelchair Mobility     Tilt Bed    Modified Rankin (Stroke Patients Only)       Balance Overall balance assessment: Needs assistance Sitting-balance support: Feet supported Sitting balance-Leahy Scale: Good     Standing balance support: Bilateral upper extremity supported, During functional activity, Reliant on assistive device for balance Standing balance-Leahy Scale: Poor                              Cognition Arousal: Alert Behavior During Therapy: WFL for tasks assessed/performed Overall Cognitive Status: Within Functional Limits for tasks assessed                                          Exercises Total Joint Exercises Ankle Circles/Pumps: AROM, Both, 15 reps Quad Sets: AROM, Both, 10 reps, Supine Heel Slides: AAROM, Right, 15 reps, Supine Straight Leg Raises: AAROM, AROM, Right, Supine, 20 reps    General Comments        Pertinent Vitals/Pain Pain Assessment Pain Assessment: 0-10 Pain Score: 5  Pain Location: R knee Pain Descriptors / Indicators: Aching, Constant, Discomfort, Operative site guarding Pain Intervention(s): Limited activity within patient's tolerance, Monitored during session, Premedicated before session, Ice applied  Home Living                          Prior Function            PT Goals (current goals can now be found in the care plan section) Acute Rehab PT Goals Patient Stated Goal: to be able to walk in the neighborhood, keep up and go out with my friends PT Goal Formulation: With patient Time For Goal Achievement: 09/15/23 Progress towards PT goals: Not progressing toward goals - comment (orthostatic)    Frequency    7X/week      PT  Plan      Co-evaluation              AM-PAC PT "6 Clicks" Mobility   Outcome Measure  Help needed turning from your back to your side while in a flat bed without using bedrails?: None Help needed moving from lying on your back to sitting on the side of a flat bed without using bedrails?: None Help needed moving to and from a bed to a chair (including a wheelchair)?: A Little Help needed standing up from a chair using your arms (e.g., wheelchair or bedside chair)?: A Little Help needed to walk in hospital room?: A Little Help needed climbing 3-5 steps with a railing? : Total 6 Click Score: 18    End of Session Equipment Utilized During Treatment: Gait belt Activity Tolerance: Other (comment) (orthostatic) Patient left: in bed;with call bell/phone within reach;with bed alarm set;with family/visitor present Nurse Communication: Mobility status PT Visit Diagnosis: Unsteadiness on feet (R26.81);Other abnormalities of gait and mobility (R26.89);Muscle weakness (generalized) (M62.81);Difficulty in walking, not elsewhere classified (R26.2);Pain Pain - Right/Left: Right Pain - part of body: Knee;Leg     Time: 1510-1557 PT Time Calculation (min) (ACUTE ONLY): 47 min  Charges:    $Gait Training: 8-22 mins $Therapeutic Exercise: 8-22 mins $Therapeutic Activity: 8-22 mins PT General Charges $$ ACUTE PT VISIT: 1 Visit                     Mauro Kaufmann PT Acute Rehabilitation Services Pager [phone] Office [phone]    Earla Charlie 09/03/2023, 4:05 PM

## 2023-09-03 NOTE — Progress Notes (Signed)
Physical Therapy Treatment Patient Details Name: Heather Hoover MRN: [id number] DOB: [birth year] Today's Date: 09/03/2023   History of Present Illness 77 yo female presents to therapy s/p R TKA on 09/01/2023 due to failure of conservative measures. 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    PT Comments  Pt continues very motivated but limited this am by continued orthostatic hypotension.  Pt assisted to [facility] for toileting and then up to ambulate limited distance in room but limited by c/o fatigue and dizziness.  BP sitting 132/67; stand 112/75; and after walking 16' 87/47. RN aware.    If plan is discharge home, recommend the following: A little help with walking and/or transfers;A little help with bathing/dressing/bathroom;Assistance with cooking/housework;Assist for transportation;Help with stairs or ramp for entrance   Can travel by private vehicle        Equipment Recommendations  None recommended by PT    Recommendations for Other Services       Precautions / Restrictions Precautions Precautions: Knee;Fall Restrictions Weight Bearing Restrictions: No RLE Weight Bearing: Weight bearing as tolerated     Mobility  Bed Mobility Overal bed mobility: Needs Assistance Bed Mobility: Supine to Sit     Supine to sit: Supervision, HOB elevated     General bed mobility comments: Supervision for safety and minimal use of bed rail    Transfers Overall transfer level: Needs assistance Equipment used: Rolling walker (2 wheels) Transfers: Sit to/from Stand, Bed to chair/wheelchair/BSC Sit to Stand: Contact guard assist, From elevated surface   Step pivot transfers: Min assist       General transfer comment: cues for LE management and use of UEs to self assist; step pvt bed to [facility]    Ambulation/Gait Ambulation/Gait assistance: Min  assist Gait Distance (Feet): 16 Feet Assistive device: Rolling walker (2 wheels) Gait Pattern/deviations: Step-to pattern, Antalgic Gait velocity: decreased     General Gait Details: cues for posture and proper distance from RW; distance ltd by orthostatic hypotension   Stairs             Wheelchair Mobility     Tilt Bed    Modified Rankin (Stroke Patients Only)       Balance Overall balance assessment: Needs assistance Sitting-balance support: Feet supported Sitting balance-Leahy Scale: Good     Standing balance support: Bilateral upper extremity supported, During functional activity, Reliant on assistive device for balance Standing balance-Leahy Scale: Poor                              Cognition Arousal: Alert Behavior During Therapy: WFL for tasks assessed/performed Overall Cognitive Status: Within Functional Limits for tasks assessed                                          Exercises      General Comments        Pertinent Vitals/Pain Pain Assessment Pain Assessment: 0-10 Pain Score: 5  Pain Location: R knee Pain Descriptors / Indicators: Aching, Constant, Discomfort, Operative site guarding Pain Intervention(s): Limited activity within patient's tolerance, Monitored during session, Premedicated before session, Ice applied    Home Living  Prior Function            PT Goals (current goals can now be found in the care plan section) Acute Rehab PT Goals Patient Stated Goal: to be able to walk in the neighborhood, keep up and go out with my friends PT Goal Formulation: With patient Time For Goal Achievement: 09/15/23 Progress towards PT goals: Not progressing toward goals - comment (orthostatic)    Frequency    7X/week      PT Plan      Co-evaluation              AM-PAC PT "6 Clicks" Mobility   Outcome Measure  Help needed turning from your back to your side  while in a flat bed without using bedrails?: None Help needed moving from lying on your back to sitting on the side of a flat bed without using bedrails?: None Help needed moving to and from a bed to a chair (including a wheelchair)?: A Little Help needed standing up from a chair using your arms (e.g., wheelchair or bedside chair)?: A Little Help needed to walk in hospital room?: A Little Help needed climbing 3-5 steps with a railing? : A Little 6 Click Score: 20    End of Session Equipment Utilized During Treatment: Gait belt Activity Tolerance: Other (comment) (orthostatic) Patient left: in chair;with call bell/phone within reach;with family/visitor present Nurse Communication: Mobility status PT Visit Diagnosis: Unsteadiness on feet (R26.81);Other abnormalities of gait and mobility (R26.89);Muscle weakness (generalized) (M62.81);Difficulty in walking, not elsewhere classified (R26.2);Pain Pain - Right/Left: Right Pain - part of body: Knee;Leg     Time: 1610-9604 PT Time Calculation (min) (ACUTE ONLY): 29 min  Charges:    $Gait Training: 8-22 mins $Therapeutic Activity: 8-22 mins PT General Charges $$ ACUTE PT VISIT: 1 Visit                     Mauro Kaufmann PT Acute Rehabilitation Services Pager [phone] Office [phone]    Shenell Rogalski 09/03/2023, 1:01 PM

## 2023-09-03 NOTE — Progress Notes (Signed)
    Subjective:  Patient reports pain as mild to moderate.  Denies N/V/CP/SOB/Abd pain. She reports she is feeling okay today. She denies any tingling or numbness in LE bilaterally. She is hopeful for d/c home today.   Objective:   VITALS:   Vitals:   09/02/23 1030 09/02/23 1336 09/02/23 2121 09/03/23 0647  BP: (!) 116/58 (!) 122/58 (!) 141/71 (!) 122/56  Pulse: 84 88 94 85  Resp: 16 16 18 18   Temp: 98.1 F (36.7 C) (!) 97.4 F (36.3 C) (!) 100.5 F (38.1 C) 99.3 F (37.4 C)  TempSrc:   Oral   SpO2: 100% 100% 93% 100%  Weight:      Height:        Patient is lying in bed. NAD.  Neurologically intact ABD soft Neurovascular intact Sensation intact distally Intact pulses distally Dorsiflexion/Plantar flexion intact Incision: dressing C/D/I No cellulitis present Compartment soft   Lab Results  Component Value Date   WBC 7.3 09/03/2023   HGB 8.3 (L) 09/03/2023   HCT 25.5 (L) 09/03/2023   MCV 97.7 09/03/2023   PLT 108 (L) 09/03/2023   BMET    Component Value Date/Time   NA 129 (L) 09/03/2023 0308   NA 137 10/23/2022 1017   K 3.6 09/03/2023 0308   CL 97 (L) 09/03/2023 0308   CO2 26 09/03/2023 0308   GLUCOSE 124 (H) 09/03/2023 0308   BUN 8 09/03/2023 0308   BUN 16 10/23/2022 1017   CREATININE 0.54 09/03/2023 0308   CREATININE 0.62 07/20/2023 0816   CALCIUM 7.9 (L) 09/03/2023 0308   EGFR 95 07/08/2023 1249   EGFR 92 10/23/2022 1017   GFRNONAA >60 09/03/2023 0308   GFRNONAA 89 04/13/2017 0820     Assessment/Plan: 2 Days Post-Op   Principal Problem:   Degenerative arthritis of right knee Active Problems:   S/P total knee arthroplasty, right  ABLA. Hemoglobin 8.3 this morning. Continue to monitor.   WBAT with walker DVT ppx: Aspirin, SCDs, TEDS PO pain control PT/OT: Patient had some dizziness and orthostatic hypotension with PT yesterday. Continue PT today.  Dispo: D/c home with OPPT once cleared with PT.    Clois Dupes, PA-C 09/03/2023, 9:57  AM   [facility]  [place] [address], [address], [place], [place] [address] Phone: [phone] [email or web address] Facebook  [employer]

## 2023-09-03 NOTE — Plan of Care (Signed)
Problem: Education: Goal: Knowledge of the prescribed therapeutic regimen will improve Outcome: Progressing   Problem: Activity: Goal: Range of joint motion will improve Outcome: Progressing   Problem: Pain Management: Goal: Pain level will decrease with appropriate interventions Outcome: Progressing   Problem: Safety: Goal: Ability to remain free from injury will improve Outcome: Progressing

## 2023-09-03 NOTE — Plan of Care (Signed)
Problem: Education: Goal: Knowledge of the prescribed therapeutic regimen will improve Outcome: Progressing   Problem: Activity: Goal: Ability to avoid complications of mobility impairment will improve Outcome: Progressing   Problem: Pain Management: Goal: Pain level will decrease with appropriate interventions Outcome: Progressing   Problem: Coping: Goal: Level of anxiety will decrease Outcome: Progressing   Problem: Pain Managment: Goal: General experience of comfort will improve Outcome: Progressing   Haydee Salter, RN 09/03/23 10:10 AM

## 2023-09-04 NOTE — Progress Notes (Signed)
   Subjective: 3 Days Post-Op Procedure(s) (LRB): COMPUTER ASSISTED TOTAL KNEE ARTHROPLASTY (Right)  Pt c/o continued othrostasis and nausea when she gets up with therapy Pain is moderate but tolerable Denies any new symptoms or issues Working with therapy diligently  Patient reports pain as moderate.  Objective:   VITALS:   Vitals:   09/03/23 2137 09/04/23 0516  BP: (!) 114/48 (!) 126/51  Pulse: 97   Resp: 18 18  Temp: 98.7 F (37.1 C) 99.7 F (37.6 C)  SpO2: 97% 98%    Right knee: dressing intact, ace removed  No rashes, mild right foot edema due to the ace Nv intact distally No signs of infection or dvt   LABS Recent Labs    09/02/23 0303 09/03/23 0308  HGB 9.7* 8.3*  HCT 29.9* 25.5*  WBC 5.1 7.3  PLT 116* 108*    Recent Labs    09/02/23 0303 09/03/23 0308  NA 131* 129*  K 3.7 3.6  BUN 11 8  CREATININE 0.56 0.54  GLUCOSE 121* 124*     Assessment/Plan: 3 Days Post-Op Procedure(s) (LRB): COMPUTER ASSISTED TOTAL KNEE ARTHROPLASTY (Right) Acute blood loss anemia - will continue to monitor her progress and blood count Continue PT/OT Pain management      Alphonsa Overall PA-C, MPAS [facility] is now [facility]  [place] [address], [address], [place], [place] [address] Phone: [phone] [email or web address] Facebook  [employer]

## 2023-09-04 NOTE — Progress Notes (Signed)
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    PT Comments  Pt continues very cooperative and completed therex program with assist.  Will follow up in pm to re-attempt ambulation.    If plan is discharge home, recommend the following: A little help with walking and/or transfers;A little help with bathing/dressing/bathroom;Assistance with cooking/housework;Assist for transportation;Help with stairs or ramp for entrance   Can travel by private vehicle        Equipment Recommendations  None recommended by PT    Recommendations for Other Services       Precautions / Restrictions Precautions Precautions: Knee;Fall Restrictions Weight Bearing Restrictions: No RLE Weight Bearing: Weight bearing as tolerated     Mobility  Bed Mobility Overal bed mobility: Needs Assistance Bed Mobility: Supine to Sit     Supine to sit: Contact guard     General bed mobility comments: cues for sequence with min assist for R LE management    Transfers Overall transfer level: Needs assistance Equipment used: Rolling walker (2 wheels) Transfers: Sit to/from Stand, Bed to chair/wheelchair/BSC Sit to Stand: Contact guard assist, From elevated surface   Step pivot transfers: Contact guard assist       General transfer comment: cues for LE management and use of UEs to self assist; step pvt bed to [facility]    Ambulation/Gait Ambulation/Gait assistance: Min assist Gait Distance (Feet): 6 Feet Assistive device: Rolling walker (2 wheels) Gait Pattern/deviations: Step-to pattern, Antalgic Gait velocity: decreased     General  Gait Details: cues for posture and proper distance from RW; distance ltd by orthostatic hypotension   Stairs             Wheelchair Mobility     Tilt Bed    Modified Rankin (Stroke Patients Only)       Balance Overall balance assessment: Needs assistance Sitting-balance support: Feet supported Sitting balance-Leahy Scale: Good     Standing balance support: Bilateral upper extremity supported, During functional activity, Reliant on assistive device for balance Standing balance-Leahy Scale: Poor                              Cognition Arousal: Alert Behavior During Therapy: WFL for tasks assessed/performed Overall Cognitive Status: Within Functional Limits for tasks assessed                                          Exercises Total Joint Exercises Ankle Circles/Pumps: AROM, Both, 15 reps Quad Sets: AROM, Both, 10 reps, Supine Heel Slides: AAROM, Right, Supine, 20 reps Straight Leg Raises: AAROM, AROM, Right, Supine, 20 reps    General Comments        Pertinent Vitals/Pain Pain Assessment Pain Assessment: 0-10 Pain Score: 5  Pain Location: R knee Pain Descriptors / Indicators: Aching, Constant, Discomfort, Operative site guarding Pain Intervention(s): Limited activity within patient's tolerance, Monitored during session, Premedicated before session, Ice applied    Home Living  Prior Function            PT Goals (current goals can now be found in the care plan section) Acute Rehab PT Goals Patient Stated Goal: to be able to walk in the neighborhood, keep up and go out with my friends PT Goal Formulation: With patient Time For Goal Achievement: 09/15/23 Progress towards PT goals: Progressing toward goals    Frequency    7X/week      PT Plan      Co-evaluation              AM-PAC PT "6 Clicks" Mobility   Outcome Measure  Help needed turning from your back to your side  while in a flat bed without using bedrails?: None Help needed moving from lying on your back to sitting on the side of a flat bed without using bedrails?: None Help needed moving to and from a bed to a chair (including a wheelchair)?: A Little Help needed standing up from a chair using your arms (e.g., wheelchair or bedside chair)?: A Little Help needed to walk in hospital room?: Total Help needed climbing 3-5 steps with a railing? : Total 6 Click Score: 16    End of Session Equipment Utilized During Treatment: Gait belt Activity Tolerance: Other (comment) Patient left: in chair;with call bell/phone within reach;with chair alarm set;with family/visitor present Nurse Communication: Mobility status PT Visit Diagnosis: Unsteadiness on feet (R26.81);Other abnormalities of gait and mobility (R26.89);Muscle weakness (generalized) (M62.81);Difficulty in walking, not elsewhere classified (R26.2);Pain Pain - Right/Left: Right Pain - part of body: Knee;Leg     Time: 1610-9604 PT Time Calculation (min) (ACUTE ONLY): 19 min  Charges:    $Therapeutic Exercise: 8-22 mins $Therapeutic Activity: 8-22 mins PT General Charges $$ ACUTE PT VISIT: 1 Visit                     Mauro Kaufmann PT Acute Rehabilitation Services Pager [phone] Office [phone]    [facility] 09/04/2023, 12:27 PM

## 2023-09-04 NOTE — Progress Notes (Signed)
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    PT Comments  Pt continues very cooperative and with noted improvement in activity tolerance.  Pt up to ambulate limited distance in hall but with noted instability and fatigues easily requiring multiple standing rest breaks but with min c/o dizziness.  BP  supine 106/57. Sitting 110/77; standing 106/35, after walking 21' 127/63 and after ambulating additional 35' 110/57.  Pt assisted to chair and transported remaining distance to room and assisted to bed.   If plan is discharge home, recommend the following: A little help with walking and/or transfers;A little help with bathing/dressing/bathroom;Assistance with cooking/housework;Assist for transportation;Help with stairs or ramp for entrance   Can travel by private vehicle        Equipment Recommendations  None recommended by PT    Recommendations for Other Services       Precautions / Restrictions Precautions Precautions: Knee;Fall Restrictions Weight Bearing Restrictions: No RLE Weight Bearing: Weight bearing as tolerated     Mobility  Bed Mobility Overal bed mobility: Needs Assistance Bed Mobility: Sit to Supine     Supine to sit: Contact guard Sit to supine: Min assist   General bed mobility comments: cues for sequence with min assist for R LE management    Transfers Overall transfer level: Needs assistance Equipment used: Rolling walker (2 wheels) Transfers: Sit to/from Stand, Bed to chair/wheelchair/BSC Sit to Stand: Contact guard assist, From elevated surface   Step pivot transfers: Contact  guard assist       General transfer comment: cues for LE management and use of UEs to self assist; step pvt recliner to bedside    Ambulation/Gait Ambulation/Gait assistance: Min assist, Contact guard assist Gait Distance (Feet): 58 Feet Assistive device: Rolling walker (2 wheels) Gait Pattern/deviations: Step-to pattern, Decreased step length - right, Decreased step length - left, Shuffle, Trunk flexed Gait velocity: decreased     General Gait Details: cues for posture and position from RW; multiple rest breaks required 2* fatigue   Stairs             Wheelchair Mobility     Tilt Bed    Modified Rankin (Stroke Patients Only)       Balance Overall balance assessment: Needs assistance Sitting-balance support: Feet supported Sitting balance-Leahy Scale: Good     Standing balance support: Bilateral upper extremity supported, During functional activity, Reliant on assistive device for balance Standing balance-Leahy Scale: Poor                              Cognition Arousal: Alert Behavior During Therapy: WFL for tasks assessed/performed Overall Cognitive Status: Within Functional Limits for tasks assessed                                          Exercises Total Joint Exercises Ankle Circles/Pumps: AROM, Both, 15 reps Quad Sets: AROM, Both, 10 reps, Supine Heel Slides: AAROM, Right, Supine, 20 reps Straight Leg Raises: AAROM, AROM, Right, Supine, 20  reps    General Comments        Pertinent Vitals/Pain Pain Assessment Pain Assessment: 0-10 Pain Score: 5  Pain Location: R knee Pain Descriptors / Indicators: Aching, Discomfort, Sore Pain Intervention(s): Limited activity within patient's tolerance, Monitored during session, Premedicated before session, Ice applied    Home Living                          Prior Function            PT Goals (current goals can now be found in the care plan section) Acute Rehab  PT Goals Patient Stated Goal: to be able to walk in the neighborhood, keep up and go out with my friends PT Goal Formulation: With patient Time For Goal Achievement: 09/15/23 Progress towards PT goals: Progressing toward goals    Frequency    7X/week      PT Plan      Co-evaluation              AM-PAC PT "6 Clicks" Mobility   Outcome Measure  Help needed turning from your back to your side while in a flat bed without using bedrails?: None Help needed moving from lying on your back to sitting on the side of a flat bed without using bedrails?: None Help needed moving to and from a bed to a chair (including a wheelchair)?: A Little Help needed standing up from a chair using your arms (e.g., wheelchair or bedside chair)?: A Little Help needed to walk in hospital room?: A Little Help needed climbing 3-5 steps with a railing? : Total 6 Click Score: 18    End of Session Equipment Utilized During Treatment: Gait belt Activity Tolerance: Patient limited by fatigue Patient left: in bed;with call bell/phone within reach;with bed alarm set;with family/visitor present Nurse Communication: Mobility status PT Visit Diagnosis: Unsteadiness on feet (R26.81);Other abnormalities of gait and mobility (R26.89);Muscle weakness (generalized) (M62.81);Difficulty in walking, not elsewhere classified (R26.2);Pain Pain - Right/Left: Right Pain - part of body: Knee;Leg     Time: 9562-1308 PT Time Calculation (min) (ACUTE ONLY): 28 min  Charges:    $Gait Training: 8-22 mins $Therapeutic Activity: 8-22 mins PT General Charges $$ ACUTE PT VISIT: 1 Visit                     Mauro Kaufmann PT Acute Rehabilitation Services Pager [phone] Office [phone]    Xylah Early 09/04/2023, 4:24 PM

## 2023-09-04 NOTE — Plan of Care (Signed)
Problem: Clinical Measurements: Goal: Postoperative complications will be avoided or minimized Outcome: Progressing   Problem: Pain Management: Goal: Pain level will decrease with appropriate interventions Outcome: Progressing   Problem: Clinical Measurements: Goal: Cardiovascular complication will be avoided Outcome: Progressing

## 2023-09-04 NOTE — Progress Notes (Signed)
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    PT Comments  Pt continues very cooperative and demonstrating increased ease of movement but continues ltd by orthostatic hypotension.  BP sit 130/76; stand 149/100; after ambulating 6' to sit in chair 83/49.  RN aware.   If plan is discharge home, recommend the following: A little help with walking and/or transfers;A little help with bathing/dressing/bathroom;Assistance with cooking/housework;Assist for transportation;Help with stairs or ramp for entrance   Can travel by private vehicle        Equipment Recommendations  None recommended by PT    Recommendations for Other Services       Precautions / Restrictions Precautions Precautions: Knee;Fall Restrictions Weight Bearing Restrictions: No RLE Weight Bearing: Weight bearing as tolerated     Mobility  Bed Mobility Overal bed mobility: Needs Assistance Bed Mobility: Supine to Sit     Supine to sit: Contact guard     General bed mobility comments: cues for sequence with min assist for R LE management    Transfers Overall transfer level: Needs assistance Equipment used: Rolling walker (2 wheels) Transfers: Sit to/from Stand, Bed to chair/wheelchair/BSC Sit to Stand: Contact guard assist, From elevated surface   Step pivot transfers: Contact guard assist       General transfer comment: cues for LE management and use of UEs to self assist; step pvt bed to [facility]    Ambulation/Gait Ambulation/Gait assistance: Min assist Gait Distance (Feet): 6 Feet Assistive device: Rolling walker (2 wheels) Gait  Pattern/deviations: Step-to pattern, Antalgic Gait velocity: decreased     General Gait Details: cues for posture and proper distance from RW; distance ltd by orthostatic hypotension   Stairs             Wheelchair Mobility     Tilt Bed    Modified Rankin (Stroke Patients Only)       Balance Overall balance assessment: Needs assistance Sitting-balance support: Feet supported Sitting balance-Leahy Scale: Good     Standing balance support: Bilateral upper extremity supported, During functional activity, Reliant on assistive device for balance Standing balance-Leahy Scale: Poor                              Cognition Arousal: Alert Behavior During Therapy: WFL for tasks assessed/performed Overall Cognitive Status: Within Functional Limits for tasks assessed                                          Exercises      General Comments        Pertinent Vitals/Pain Pain Assessment Pain Assessment: 0-10 Pain Score: 4  Pain Location: R knee Pain Descriptors / Indicators: Aching, Constant, Discomfort, Operative site guarding Pain Intervention(s): Limited activity within patient's tolerance, Monitored during session, Premedicated before session, Ice applied    Home Living                          Prior Function  PT Goals (current goals can now be found in the care plan section) Acute Rehab PT Goals Patient Stated Goal: to be able to walk in the neighborhood, keep up and go out with my friends PT Goal Formulation: With patient Time For Goal Achievement: 09/15/23 Progress towards PT goals: Progressing toward goals    Frequency    7X/week      PT Plan      Co-evaluation              AM-PAC PT "6 Clicks" Mobility   Outcome Measure  Help needed turning from your back to your side while in a flat bed without using bedrails?: None Help needed moving from lying on your back to sitting on the side of  a flat bed without using bedrails?: None Help needed moving to and from a bed to a chair (including a wheelchair)?: A Little Help needed standing up from a chair using your arms (e.g., wheelchair or bedside chair)?: A Little Help needed to walk in hospital room?: Total Help needed climbing 3-5 steps with a railing? : Total 6 Click Score: 16    End of Session Equipment Utilized During Treatment: Gait belt Activity Tolerance: Other (comment) (orthostatic) Patient left: in chair;with call bell/phone within reach;with chair alarm set;with family/visitor present Nurse Communication: Mobility status PT Visit Diagnosis: Unsteadiness on feet (R26.81);Other abnormalities of gait and mobility (R26.89);Muscle weakness (generalized) (M62.81);Difficulty in walking, not elsewhere classified (R26.2);Pain Pain - Right/Left: Right Pain - part of body: Knee;Leg     Time: 4782-9562 PT Time Calculation (min) (ACUTE ONLY): 26 min  Charges:    $Therapeutic Activity: 8-22 mins PT General Charges $$ ACUTE PT VISIT: 1 Visit                     Mauro Kaufmann PT Acute Rehabilitation Services Pager [phone] Office [phone]    Ruven Corradi 09/04/2023, 12:22 PM

## 2023-09-05 DIAGNOSIS — I951 Orthostatic hypotension: Secondary | ICD-10-CM | POA: Diagnosis not present

## 2023-09-05 DIAGNOSIS — Z79899 Other long term (current) drug therapy: Secondary | ICD-10-CM | POA: Diagnosis not present

## 2023-09-05 DIAGNOSIS — Z7983 Long term (current) use of bisphosphonates: Secondary | ICD-10-CM | POA: Diagnosis not present

## 2023-09-05 DIAGNOSIS — D62 Acute posthemorrhagic anemia: Secondary | ICD-10-CM | POA: Diagnosis not present

## 2023-09-05 DIAGNOSIS — Z8249 Family history of ischemic heart disease and other diseases of the circulatory system: Secondary | ICD-10-CM | POA: Diagnosis not present

## 2023-09-05 DIAGNOSIS — Z803 Family history of malignant neoplasm of breast: Secondary | ICD-10-CM | POA: Diagnosis not present

## 2023-09-05 DIAGNOSIS — Z90710 Acquired absence of both cervix and uterus: Secondary | ICD-10-CM | POA: Diagnosis not present

## 2023-09-05 DIAGNOSIS — R110 Nausea: Secondary | ICD-10-CM | POA: Diagnosis not present

## 2023-09-05 DIAGNOSIS — Q256 Stenosis of pulmonary artery: Secondary | ICD-10-CM | POA: Diagnosis not present

## 2023-09-05 DIAGNOSIS — M1711 Unilateral primary osteoarthritis, right knee: Secondary | ICD-10-CM | POA: Diagnosis present

## 2023-09-05 DIAGNOSIS — Z888 Allergy status to other drugs, medicaments and biological substances status: Secondary | ICD-10-CM | POA: Diagnosis not present

## 2023-09-05 DIAGNOSIS — Z820 Family history of epilepsy and other diseases of the nervous system: Secondary | ICD-10-CM | POA: Diagnosis not present

## 2023-09-05 DIAGNOSIS — E7800 Pure hypercholesterolemia, unspecified: Secondary | ICD-10-CM | POA: Diagnosis present

## 2023-09-05 DIAGNOSIS — Z8261 Family history of arthritis: Secondary | ICD-10-CM | POA: Diagnosis not present

## 2023-09-05 DIAGNOSIS — E222 Syndrome of inappropriate secretion of antidiuretic hormone: Secondary | ICD-10-CM | POA: Diagnosis present

## 2023-09-05 DIAGNOSIS — I119 Hypertensive heart disease without heart failure: Secondary | ICD-10-CM | POA: Diagnosis present

## 2023-09-05 DIAGNOSIS — Z791 Long term (current) use of non-steroidal anti-inflammatories (NSAID): Secondary | ICD-10-CM | POA: Diagnosis not present

## 2023-09-05 DIAGNOSIS — E871 Hypo-osmolality and hyponatremia: Secondary | ICD-10-CM | POA: Diagnosis not present

## 2023-09-05 DIAGNOSIS — M8580 Other specified disorders of bone density and structure, unspecified site: Secondary | ICD-10-CM | POA: Diagnosis present

## 2023-09-05 DIAGNOSIS — K921 Melena: Secondary | ICD-10-CM | POA: Diagnosis not present

## 2023-09-05 NOTE — Plan of Care (Signed)
  Problem: Clinical Measurements: Goal: Postoperative complications will be avoided or minimized Outcome: Progressing   Problem: Pain Management: Goal: Pain level will decrease with appropriate interventions Outcome: Progressing   Problem: Activity: Goal: Ability to avoid complications of mobility impairment will improve Outcome: Progressing   Problem: Pain Management: Goal: Pain level will decrease with appropriate interventions Outcome: Progressing   Problem: Education: Goal: Knowledge of General Education information will improve Description: Including pain rating scale, medication(s)/side effects and non-pharmacologic comfort measures Outcome: Progressing

## 2023-09-05 NOTE — Plan of Care (Signed)
Problem: Pain Management: Goal: Pain level will decrease with appropriate interventions Outcome: Progressing   Problem: Activity: Goal: Risk for activity intolerance will decrease Outcome: Progressing   Problem: Pain Managment: Goal: General experience of comfort will improve Outcome: Progressing

## 2023-09-05 NOTE — Progress Notes (Signed)
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    PT Comments  Pt in good spirits and very cooperative.  Therex program completed with assist but OOB deferred on arrival of bfast.  Will follow up with OOB and monitor orthostatic BPs.    If plan is discharge home, recommend the following: A little help with walking and/or transfers;A little help with bathing/dressing/bathroom;Assistance with cooking/housework;Assist for transportation;Help with stairs or ramp for entrance   Can travel by private vehicle        Equipment Recommendations  None recommended by PT    Recommendations for Other Services       Precautions / Restrictions Precautions Precautions: Knee;Fall Restrictions Weight Bearing Restrictions: No RLE Weight Bearing: Weight bearing as tolerated     Mobility  Bed Mobility               General bed mobility comments: OOB deferred to after bfast    Transfers                        Ambulation/Gait                   Stairs             Wheelchair Mobility     Tilt Bed    Modified Rankin (Stroke Patients Only)       Balance                                            Cognition Arousal: Alert Behavior During Therapy: WFL for tasks assessed/performed Overall Cognitive Status: Within Functional Limits for tasks assessed                                          Exercises Total Joint Exercises Ankle Circles/Pumps: AROM, Both, 15 reps Quad Sets: AROM, Both, 10 reps, Supine Heel Slides: AAROM, Right, Supine, 20 reps Hip  ABduction/ADduction: AAROM, AROM, Right, 15 reps, Supine Straight Leg Raises: AAROM, AROM, Right, Supine, 20 reps    General Comments        Pertinent Vitals/Pain Pain Assessment Pain Assessment: 0-10 Pain Score: 4  Pain Location: R knee Pain Descriptors / Indicators: Aching, Discomfort, Sore Pain Intervention(s): Limited activity within patient's tolerance, Monitored during session, Premedicated before session    Home Living                          Prior Function            PT Goals (current goals can now be found in the care plan section) Acute Rehab PT Goals Patient Stated Goal: to be able to walk in the neighborhood, keep up and go out with my friends PT Goal Formulation: With patient Time For Goal Achievement: 09/15/23 Progress towards PT goals: Progressing toward goals    Frequency    7X/week  PT Plan      Co-evaluation              AM-PAC PT "6 Clicks" Mobility   Outcome Measure  Help needed turning from your back to your side while in a flat bed without using bedrails?: None Help needed moving from lying on your back to sitting on the side of a flat bed without using bedrails?: None Help needed moving to and from a bed to a chair (including a wheelchair)?: A Little Help needed standing up from a chair using your arms (e.g., wheelchair or bedside chair)?: A Little Help needed to walk in hospital room?: A Little Help needed climbing 3-5 steps with a railing? : Total 6 Click Score: 18    End of Session Equipment Utilized During Treatment: Gait belt Activity Tolerance: Patient tolerated treatment well Patient left: in bed;with call bell/phone within reach;with bed alarm set;with family/visitor present Nurse Communication: Mobility status PT Visit Diagnosis: Unsteadiness on feet (R26.81);Other abnormalities of gait and mobility (R26.89);Muscle weakness (generalized) (M62.81);Difficulty in walking, not elsewhere classified  (R26.2);Pain Pain - Right/Left: Right Pain - part of body: Knee;Leg     Time: 4132-4401 PT Time Calculation (min) (ACUTE ONLY): 21 min  Charges:    $Therapeutic Exercise: 8-22 mins PT General Charges $$ ACUTE PT VISIT: 1 Visit                     Mauro Kaufmann PT Acute Rehabilitation Services Pager [phone] Office [phone]    Heather Hoover 09/05/2023, 8:56 AM

## 2023-09-05 NOTE — Progress Notes (Signed)
Physical Therapy Treatment Patient Details Name: Heather Hoover MRN: [id number] DOB: [birth year] Today's Date: 09/05/2023   History of Present Illness 77 yo female presents to therapy s/p R TKA on 09/01/2023 due to failure of conservative measures. 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    PT Comments  Pt in good spirits and with marked improvement in activity tolerance noted.  Pt up to ambulate increased distance in halls with noted improvement with stability.  Pt with one episode of dizziness but able to push past and denies dizziness at session end.  BP supine 123/62; sit 124/59; stand 145/59' after walking 40' 129/62 with c/o dizziness and after walking additional 40' 132/71 sans dizziness.  Pt hopeful for dc home this date.    If plan is discharge home, recommend the following: A little help with walking and/or transfers;A little help with bathing/dressing/bathroom;Assistance with cooking/housework;Assist for transportation;Help with stairs or ramp for entrance   Can travel by private vehicle        Equipment Recommendations  None recommended by PT    Recommendations for Other Services       Precautions / Restrictions Precautions Precautions: Knee;Fall Restrictions Weight Bearing Restrictions: No RLE Weight Bearing: Weight bearing as tolerated     Mobility  Bed Mobility Overal bed mobility: Needs Assistance Bed Mobility: Supine to Sit     Supine to sit: Supervision     General bed mobility comments: OOB deferred to after bfast    Transfers Overall transfer level: Needs assistance Equipment used: Rolling walker (2 wheels) Transfers: Sit to/from Stand, Bed to chair/wheelchair/BSC Sit to Stand: Contact guard assist, Supervision           General transfer comment: Pt self-cues for LE management and use of UEs to self assist     Ambulation/Gait Ambulation/Gait assistance: Contact guard assist Gait Distance (Feet): 80 Feet Assistive device: Rolling walker (2 wheels) Gait Pattern/deviations: Step-to pattern, Decreased step length - right, Decreased step length - left, Shuffle, Trunk flexed Gait velocity: decreased     General Gait Details: cues for posture and position from RW; multiple rest breaks required 2* fatigue   Stairs             Wheelchair Mobility     Tilt Bed    Modified Rankin (Stroke Patients Only)       Balance Overall balance assessment: Needs assistance Sitting-balance support: Feet supported Sitting balance-Leahy Scale: Good     Standing balance support: Single extremity supported Standing balance-Leahy Scale: Poor                              Cognition Arousal: Alert Behavior During Therapy: WFL for tasks assessed/performed Overall Cognitive Status: Within Functional Limits for tasks assessed                                          Exercises Total Joint Exercises Ankle Circles/Pumps: AROM, Both, 15 reps Quad Sets: AROM, Both, 10 reps, Supine Heel Slides: AAROM, Right, Supine, 20 reps Hip ABduction/ADduction: AAROM, AROM, Right, 15 reps, Supine Straight Leg Raises: AAROM, AROM, Right, Supine, 20 reps    General Comments        Pertinent Vitals/Pain Pain Assessment Pain  Assessment: 0-10 Pain Score: 4  Pain Location: R knee Pain Descriptors / Indicators: Aching, Discomfort, Sore Pain Intervention(s): Limited activity within patient's tolerance, Monitored during session, Premedicated before session    Home Living                          Prior Function            PT Goals (current goals can now be found in the care plan section) Acute Rehab PT Goals Patient Stated Goal: to be able to walk in the neighborhood, keep up and go out with my friends PT Goal Formulation: With patient Time For Goal Achievement:  09/15/23 Progress towards PT goals: Progressing toward goals    Frequency    7X/week      PT Plan      Co-evaluation              AM-PAC PT "6 Clicks" Mobility   Outcome Measure  Help needed turning from your back to your side while in a flat bed without using bedrails?: None Help needed moving from lying on your back to sitting on the side of a flat bed without using bedrails?: None Help needed moving to and from a bed to a chair (including a wheelchair)?: A Little Help needed standing up from a chair using your arms (e.g., wheelchair or bedside chair)?: A Little Help needed to walk in hospital room?: A Little Help needed climbing 3-5 steps with a railing? : Total 6 Click Score: 18    End of Session Equipment Utilized During Treatment: Gait belt Activity Tolerance: Patient tolerated treatment well Patient left: in chair;with call bell/phone within reach;with chair alarm set;with family/visitor present Nurse Communication: Mobility status PT Visit Diagnosis: Unsteadiness on feet (R26.81);Other abnormalities of gait and mobility (R26.89);Muscle weakness (generalized) (M62.81);Difficulty in walking, not elsewhere classified (R26.2);Pain Pain - Right/Left: Right Pain - part of body: Knee;Leg     Time: 9562-1308 PT Time Calculation (min) (ACUTE ONLY): 24 min  Charges:    $Gait Training: 23-37 mins PT General Charges $$ ACUTE PT VISIT: 1 Visit                     Mauro Kaufmann PT Acute Rehabilitation Services Pager [phone] Office [phone]    Heather Hoover 09/05/2023, 12:18 PM

## 2023-09-05 NOTE — Progress Notes (Signed)
Physical Therapy Treatment Patient Details Name: Heather Hoover MRN: [id number] DOB: 1946-03-09 Today's Date: 09/05/2023   History of Present Illness 77 yo female presents to therapy s/p R TKA on 09/01/2023 due to failure of conservative measures. 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    PT Comments  Pt continues very motivated but limited this pm by continued orthostatic hypotension.  BP up in recliner 133/75; sit 144/67; stand 72/50 with pt denying dizziness but reporting sudden onset of exhaustion and requesting to return to sitting.  RN aware,   If plan is discharge home, recommend the following: A little help with walking and/or transfers;A little help with bathing/dressing/bathroom;Assistance with cooking/housework;Assist for transportation;Help with stairs or ramp for entrance   Can travel by private vehicle        Equipment Recommendations  None recommended by PT    Recommendations for Other Services       Precautions / Restrictions Precautions Precautions: Knee;Fall Restrictions Weight Bearing Restrictions: No RLE Weight Bearing: Weight bearing as tolerated     Mobility  Bed Mobility Overal bed mobility: Needs Assistance Bed Mobility: Supine to Sit     Supine to sit: Supervision     General bed mobility comments: UP in chair and returns to same    Transfers Overall transfer level: Needs assistance Equipment used: Rolling walker (2 wheels) Transfers: Sit to/from Stand, Bed to chair/wheelchair/BSC Sit to Stand: Contact guard assist, Supervision           General transfer comment: Pt self-cues for LE management and use of UEs to self assist    Ambulation/Gait Ambulation/Gait assistance: Contact guard assist Gait Distance (Feet): 80 Feet Assistive device: Rolling walker (2 wheels) Gait Pattern/deviations: Step-to  pattern, Decreased step length - right, Decreased step length - left, Shuffle, Trunk flexed Gait velocity: decreased     General Gait Details: Stood only but returned to sitting with significant drop in BP   Stairs             Wheelchair Mobility     Tilt Bed    Modified Rankin (Stroke Patients Only)       Balance Overall balance assessment: Needs assistance Sitting-balance support: Feet supported Sitting balance-Leahy Scale: Good     Standing balance support: Single extremity supported Standing balance-Leahy Scale: Poor                              Cognition Arousal: Alert Behavior During Therapy: WFL for tasks assessed/performed Overall Cognitive Status: Within Functional Limits for tasks assessed                                          Exercises      General Comments        Pertinent Vitals/Pain Pain Assessment Pain Assessment: 0-10 Pain Score: 4  Pain Location: R knee Pain Descriptors / Indicators: Aching, Discomfort, Sore Pain Intervention(s): Premedicated before session, Monitored during session, Limited activity within patient's tolerance, Ice applied    Home Living                          Prior Function  PT Goals (current goals can now be found in the care plan section) Acute Rehab PT Goals Patient Stated Goal: to be able to walk in the neighborhood, keep up and go out with my friends PT Goal Formulation: With patient Time For Goal Achievement: 09/15/23 Progress towards PT goals: Not progressing toward goals - comment (Orthostatic)    Frequency    7X/week      PT Plan      Co-evaluation              AM-PAC PT "6 Clicks" Mobility   Outcome Measure  Help needed turning from your back to your side while in a flat bed without using bedrails?: None Help needed moving from lying on your back to sitting on the side of a flat bed without using bedrails?: None Help needed  moving to and from a bed to a chair (including a wheelchair)?: A Little Help needed standing up from a chair using your arms (e.g., wheelchair or bedside chair)?: A Little Help needed to walk in hospital room?: A Little Help needed climbing 3-5 steps with a railing? : Total 6 Click Score: 18    End of Session Equipment Utilized During Treatment: Gait belt Activity Tolerance: Other (comment) (orthostatic) Patient left: in chair;with call bell/phone within reach;with chair alarm set Nurse Communication: Mobility status PT Visit Diagnosis: Unsteadiness on feet (R26.81);Other abnormalities of gait and mobility (R26.89);Muscle weakness (generalized) (M62.81);Difficulty in walking, not elsewhere classified (R26.2);Pain Pain - Right/Left: Right Pain - part of body: Knee;Leg     Time: 1357-1410 PT Time Calculation (min) (ACUTE ONLY): 13 min  Charges:    $Therapeutic Activity: 8-22 mins PT General Charges $$ ACUTE PT VISIT: 1 Visit                     Mauro Kaufmann PT Acute Rehabilitation Services Pager [phone] Office [phone]    [facility] 09/05/2023, 4:46 PM

## 2023-09-05 NOTE — Progress Notes (Signed)
Orthopedics Progress Note  Subjective: Patient reports feeling better today. She was dizzy yesterday and has not walked at all. Has steps in the home  Objective:  Vitals:   09/04/23 2051 09/05/23 0653  BP: (!) 133/59 (!) 141/82  Pulse: 92 95  Resp: 17 17  Temp: 99.3 F (37.4 C) 98.5 F (36.9 C)  SpO2: 99% 98%    General: Awake and alert  Musculoskeletal: knee dressing CDI. Good quad set! Neg Homans Neurovascularly intact  Lab Results  Component Value Date   WBC 7.3 09/03/2023   HGB 8.3 (L) 09/03/2023   HCT 25.5 (L) 09/03/2023   MCV 97.7 09/03/2023   PLT 108 (L) 09/03/2023       Component Value Date/Time   NA 129 (L) 09/03/2023 0308   NA 137 10/23/2022 1017   K 3.6 09/03/2023 0308   CL 97 (L) 09/03/2023 0308   CO2 26 09/03/2023 0308   GLUCOSE 124 (H) 09/03/2023 0308   BUN 8 09/03/2023 0308   BUN 16 10/23/2022 1017   CREATININE 0.54 09/03/2023 0308   CREATININE 0.62 07/20/2023 0816   CALCIUM 7.9 (L) 09/03/2023 0308   GFRNONAA >60 09/03/2023 0308   GFRNONAA 89 04/13/2017 0820   GFRAA 103 02/22/2019 0829   GFRAA >89 04/13/2017 0820    No results found for: "INR", "PROTIME"  Assessment/Plan: POD #2 s/p Procedure(s): COMPUTER ASSISTED TOTAL KNEE ARTHROPLASTY Stable overnight. Plan therapy and possible discharge later today if she clears therapy  Almedia Balls. Ranell Patrick, MD 09/05/2023 9:34 AM

## 2023-09-06 LAB — BASIC METABOLIC PANEL
Anion gap: 8 (ref 5–15)
BUN: 9 mg/dL (ref 8–23)
CO2: 25 mmol/L (ref 22–32)
Calcium: 7.9 mg/dL — ABNORMAL LOW (ref 8.9–10.3)
Chloride: 91 mmol/L — ABNORMAL LOW (ref 98–111)
Creatinine, Ser: 0.56 mg/dL (ref 0.44–1.00)
GFR, Estimated: >60 mL/min (ref >60)
Glucose, Bld: 115 mg/dL — ABNORMAL HIGH (ref 70–99)
Potassium: 3.6 mmol/L (ref 3.5–5.1)
Sodium: 124 mmol/L — ABNORMAL LOW (ref 135–145)

## 2023-09-06 LAB — CBC
HCT: 24.9 % — ABNORMAL LOW (ref 36.0–46.0)
Hemoglobin: 8.2 g/dL — ABNORMAL LOW (ref 12.0–15.0)
MCH: 31.8 pg (ref 26.0–34.0)
MCHC: 32.9 g/dL (ref 30.0–36.0)
MCV: 96.5 fL (ref 80.0–100.0)
Platelets: 204 10*3/uL (ref 150–400)
RBC: 2.58 MIL/uL — ABNORMAL LOW (ref 3.87–5.11)
RDW: 12 % (ref 11.5–15.5)
WBC: 7.9 10*3/uL (ref 4.0–10.5)
nRBC: 0 % (ref 0.0–0.2)

## 2023-09-06 MED ORDER — SODIUM CHLORIDE 0.9 % IV BOLUS
500.0000 mL | Freq: Once | INTRAVENOUS | Status: AC
  Administered 2023-09-06: 500 mL via INTRAVENOUS

## 2023-09-06 MED ORDER — HYDROCODONE-ACETAMINOPHEN 7.5-325 MG PO TABS: 1.0000 | ORAL_TABLET | ORAL | Status: DC | PRN

## 2023-09-06 MED ORDER — HYDROCODONE-ACETAMINOPHEN 5-325 MG PO TABS
1.0000 | ORAL_TABLET | ORAL | Status: DC | PRN
  Administered 2023-09-07: 2 via ORAL
  Administered 2023-09-07: 1 via ORAL
  Administered 2023-09-07 – 2023-09-09 (×5): 2 via ORAL
  Filled 2023-09-06: qty 2
  Filled 2023-09-06: qty 1
  Filled 2023-09-06 (×6): qty 2

## 2023-09-06 NOTE — Progress Notes (Signed)
PT notified RN of fainting episode when attempting to standing from sitting on the bedside. Patient was laid down and immediately became oriented, talking about event and her abdominal pain which she says has been consistent over night and when she urinates. BP 114/61, HR 79, MAP  78, Temp 99.1. Patient alert and oriented, husband at bedside. NP notified, and oncoming RN, will continue to monitor.

## 2023-09-06 NOTE — Progress Notes (Addendum)
Physical Therapy Treatment Patient Details Name: Heather Hoover MRN: [id number] DOB: 1946/06/25 Today's Date: 09/06/2023   History of Present Illness 77 yo female presents to therapy s/p R TKA on 09/01/2023 due to failure of conservative measures. 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    PT Comments  Pt with limited progress d/t medical issues. BPs placed in flow sheets. Pt unable to sit > 3 minutes d/t dizziness/lightheadedness. Pt reports feeling as if she would "pass out"  if we did not assist her back to supine. RN made aware.  It is noted that Hgb is trending downward and Na+ is low/trending downward as well;  pt may benefit from a medical consult as she is not progressing d/t medical issues; she is doing well with bed mobility, knee ROM and has good pain control.  PT will continue to follow.   If plan is discharge home, recommend the following: A little help with walking and/or transfers;A little help with bathing/dressing/bathroom;Assistance with cooking/housework;Assist for transportation;Help with stairs or ramp for entrance   Can travel by private vehicle        Equipment Recommendations  None recommended by PT    Recommendations for Other Services       Precautions / Restrictions Precautions Precautions: Knee;Fall Precaution Booklet Issued: No Restrictions RLE Weight Bearing: Weight bearing as tolerated     Mobility  Bed Mobility Overal bed mobility: Needs Assistance Bed Mobility: Supine to Sit, Sit to Supine, Rolling Rolling: Supervision, Contact guard assist   Supine to sit: Contact guard Sit to supine: Min assist, +2 for safety/equipment   General bed mobility comments: incr time and cues to self assist to come to sit; pt with LOC sitting EOB, assist to safely return to supine; pt is able to scoot up in supine using rails,  no assist    Transfers Overall transfer level: Needs assistance Equipment used: Rolling walker (2 wheels) Transfers: Sit to/from Stand Sit to Stand: Contact guard assist, Supervision           General transfer comment: unable d/t dizziness, near LOC in sitting    Ambulation/Gait               General Gait Details: unable d/t drop in BP   Stairs             Wheelchair Mobility     Tilt Bed    Modified Rankin (Stroke Patients Only)       Balance     Sitting balance-Leahy Scale: Good Sitting balance - Comments: good until LOC                                    Cognition Arousal: Alert Behavior During Therapy: WFL for tasks assessed/performed Overall Cognitive Status: Within Functional Limits for tasks assessed                                          Exercises Total Joint Exercises Ankle Circles/Pumps: AROM, Both, 10 reps Goniometric ROM: grossly 8 to 65 degrees    General Comments        Pertinent Vitals/Pain Pain Assessment Pain Assessment: 0-10 Pain Score: 3  Pain Location: R knee  Pain Descriptors / Indicators: Discomfort Pain Intervention(s): Limited activity within patient's tolerance, Monitored during session    Home Living                          Prior Function            PT Goals (current goals can now be found in the care plan section) Acute Rehab PT Goals Patient Stated Goal: to be able to walk in the neighborhood, keep up and go out with my friends PT Goal Formulation: With patient Time For Goal Achievement: 09/15/23 Progress towards PT goals: Not progressing toward goals - comment (medical issues)    Frequency    7X/week      PT Plan      Co-evaluation              AM-PAC PT "6 Clicks" Mobility   Outcome Measure  Help needed turning from your back to your side while in a flat bed without using bedrails?: A Little Help needed moving from lying on your back  to sitting on the side of a flat bed without using bedrails?: A Little Help needed moving to and from a bed to a chair (including a wheelchair)?: A Little Help needed standing up from a chair using your arms (e.g., wheelchair or bedside chair)?: A Little Help needed to walk in hospital room?: Total Help needed climbing 3-5 steps with a railing? : Total 6 Click Score: 14    End of Session Equipment Utilized During Treatment: Gait belt Activity Tolerance: Treatment limited secondary to medical complications (Comment) (decr BP) Patient left: in bed;with call bell/phone within reach;with bed alarm set;with family/visitor present   PT Visit Diagnosis: Unsteadiness on feet (R26.81);Other abnormalities of gait and mobility (R26.89);Muscle weakness (generalized) (M62.81);Difficulty in walking, not elsewhere classified (R26.2);Pain Pain - Right/Left: Right Pain - part of body: Knee;Leg     Time: 9604-5409 PT Time Calculation (min) (ACUTE ONLY): 27 min  Charges:    $Therapeutic Activity: 23-37 mins PT General Charges $$ ACUTE PT VISIT: 1 Visit                     Rolan Wrightsman, PT  Acute Rehab Dept [facility]) [phone]  09/06/2023    [facility] 09/06/2023, 5:00 PM

## 2023-09-06 NOTE — Progress Notes (Signed)
    Subjective:  Patient reports pain as mild to moderate.  Denies V/CP/SOB/Abd pain. She had a bowel movement yesterday. She reports her pain is well controlled. Slight nausea.   Objective:   VITALS:   Vitals:   09/04/23 2051 09/05/23 0653 09/05/23 2128 09/06/23 0519  BP: (!) 133/59 (!) 141/82 132/71 (!) 151/76  Pulse: 92 95 95 91  Resp: 17 17 19 17   Temp: 99.3 F (37.4 C) 98.5 F (36.9 C) 99.5 F (37.5 C) 98.9 F (37.2 C)  TempSrc: Oral  Oral Oral  SpO2: 99% 98%  96%  Weight:      Height:        Patient lying in bed. NAD.  Neurologically intact ABD soft Neurovascular intact Sensation intact distally Intact pulses distally Dorsiflexion/Plantar flexion intact Incision: dressing C/D/I No cellulitis present Compartment soft   Lab Results  Component Value Date   WBC 7.3 09/03/2023   HGB 8.3 (L) 09/03/2023   HCT 25.5 (L) 09/03/2023   MCV 97.7 09/03/2023   PLT 108 (L) 09/03/2023   BMET    Component Value Date/Time   NA 129 (L) 09/03/2023 0308   NA 137 10/23/2022 1017   K 3.6 09/03/2023 0308   CL 97 (L) 09/03/2023 0308   CO2 26 09/03/2023 0308   GLUCOSE 124 (H) 09/03/2023 0308   BUN 8 09/03/2023 0308   BUN 16 10/23/2022 1017   CREATININE 0.54 09/03/2023 0308   CREATININE 0.62 07/20/2023 0816   CALCIUM 7.9 (L) 09/03/2023 0308   EGFR 95 07/08/2023 1249   EGFR 92 10/23/2022 1017   GFRNONAA >60 09/03/2023 0308   GFRNONAA 89 04/13/2017 0820     Assessment/Plan: 5 Days Post-Op   Principal Problem:   Degenerative arthritis of right knee Active Problems:   S/P total knee arthroplasty, right   WBAT with walker DVT ppx: Aspirin, SCDs, TEDS PO pain control: Going to switch pain medication to see if it helps.  PT/OT: 80 feet with PT yesterday limited by dizziness. Continue PT today.  Dispo:  - D/c home with OPPT once cleared with PT.  - Labs pending this morning.    Clois Dupes, PA-C 09/06/2023, 8:18 AM  [facility]  [place] [address], [place], [place] [address] Phone: [phone] [email or web address] Facebook  [employer]

## 2023-09-06 NOTE — Progress Notes (Signed)
Physical Therapy Treatment Patient Details Name: Heather Hoover MRN: [id number] DOB: Jan 20, 1946 Today's Date: 09/06/2023   History of Present Illness 77 yo female presents to therapy s/p R TKA on 09/01/2023 due to failure of conservative measures. 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    PT Comments  Pt continues to be limited with mobility/amb d/t decr BP/orthostasis; unable to get orthostatics today bc pt was needing assist for hygiene d/t bowel and bladder incontinence.  once pt in standing pt was assisted with pericare, with briefs and pad; pt c/o dizziness, returned to sitting and had LOC while on EOB. BP 114/61 with bed in trendelenberg, SpO2=100%, HR 79. RN present end of session  It is noted that Hgb is trending downward and Na+ is low as well; pt may benefit from a medical consult as she is not progressing d/t medical issues; she is doing well with bed mobility, knee ROM and has good pain control. Pt reports her pain meds were changed this am by PA.   If plan is discharge home, recommend the following: A little help with walking and/or transfers;A little help with bathing/dressing/bathroom;Assistance with cooking/housework;Assist for transportation;Help with stairs or ramp for entrance   Can travel by private vehicle        Equipment Recommendations  None recommended by PT    Recommendations for Other Services       Precautions / Restrictions Precautions Precautions: Knee;Fall Precaution Booklet Issued: No Restrictions RLE Weight Bearing: Weight bearing as tolerated     Mobility  Bed Mobility Overal bed mobility: Needs Assistance Bed Mobility: Supine to Sit     Supine to sit: Supervision, Contact guard Sit to supine: +2 for safety/equipment, Total assist   General bed mobility comments: incr time and cues to self assist to come to  sit; pt with LOC sitting EOB, total asisst to safely return to supine; BP 114/61 with bed in trendelenberg, SpO2=100%, HR 79. RN present    Transfers Overall transfer level: Needs assistance Equipment used: Rolling walker (2 wheels) Transfers: Sit to/from Stand Sit to Stand: Contact guard assist, Supervision           General transfer comment: Pt self-cues for LE management and use of UEs to self assist; second person to  assist for hygiene d/t bowel and bladder incontinence  once pt in standing. assisted with briefs and pad; pt c/o dizziness, returned to sitting    Ambulation/Gait               General Gait Details: unable d/t drop in BP   Stairs             Wheelchair Mobility     Tilt Bed    Modified Rankin (Stroke Patients Only)       Balance     Sitting balance-Leahy Scale: Good Sitting balance - Comments: good until LOC                                    Cognition Arousal: Alert Behavior During Therapy: WFL for tasks assessed/performed Overall Cognitive Status: Within Functional Limits for tasks assessed  Exercises Total Joint Exercises Ankle Circles/Pumps: AROM, Both, 10 reps    General Comments        Pertinent Vitals/Pain Pain Assessment Pain Assessment: 0-10 Pain Score: 4  Pain Location: R knee Pain Descriptors / Indicators: Sore Pain Intervention(s): Limited activity within patient's tolerance, Monitored during session, Premedicated before session    Home Living                          Prior Function            PT Goals (current goals can now be found in the care plan section) Acute Rehab PT Goals Patient Stated Goal: to be able to walk in the neighborhood, keep up and go out with my friends PT Goal Formulation: With patient Time For Goal Achievement: 09/15/23 Progress towards PT goals: Progressing toward goals    Frequency     7X/week      PT Plan      Co-evaluation              AM-PAC PT "6 Clicks" Mobility   Outcome Measure  Help needed turning from your back to your side while in a flat bed without using bedrails?: A Little Help needed moving from lying on your back to sitting on the side of a flat bed without using bedrails?: A Little Help needed moving to and from a bed to a chair (including a wheelchair)?: A Little Help needed standing up from a chair using your arms (e.g., wheelchair or bedside chair)?: A Little Help needed to walk in hospital room?: Total Help needed climbing 3-5 steps with a railing? : Total 6 Click Score: 14    End of Session Equipment Utilized During Treatment: Gait belt Activity Tolerance: Treatment limited secondary to medical complications (Comment) (decr BP, LOC) Patient left: in bed;with call bell/phone within reach;with bed alarm set;with nursing/sitter in room   PT Visit Diagnosis: Unsteadiness on feet (R26.81);Other abnormalities of gait and mobility (R26.89);Muscle weakness (generalized) (M62.81);Difficulty in walking, not elsewhere classified (R26.2);Pain Pain - Right/Left: Right Pain - part of body: Knee;Leg     Time: 1025-1046 PT Time Calculation (min) (ACUTE ONLY): 21 min  Charges:    $Therapeutic Activity: 8-22 mins PT General Charges $$ ACUTE PT VISIT: 1 Visit                     Layney Gillson, PT  Acute Rehab Dept [facility]) [phone]  09/06/2023    [facility] 09/06/2023, 11:45 AM

## 2023-09-07 ENCOUNTER — Inpatient Hospital Stay (HOSPITAL_COMMUNITY): Payer: Medicare PPO

## 2023-09-07 DIAGNOSIS — E871 Hypo-osmolality and hyponatremia: Secondary | ICD-10-CM | POA: Diagnosis not present

## 2023-09-07 LAB — BASIC METABOLIC PANEL
Anion gap: 10 (ref 5–15)
Anion gap: 10 (ref 5–15)
Anion gap: 7 (ref 5–15)
Anion gap: 6 (ref 5–15)
BUN: 10 mg/dL (ref 8–23)
BUN: 10 mg/dL (ref 8–23)
BUN: 12 mg/dL (ref 8–23)
BUN: 11 mg/dL (ref 8–23)
CO2: 23 mmol/L (ref 22–32)
CO2: 25 mmol/L (ref 22–32)
CO2: 26 mmol/L (ref 22–32)
CO2: 24 mmol/L (ref 22–32)
Calcium: 7.8 mg/dL — ABNORMAL LOW (ref 8.9–10.3)
Calcium: 8.1 mg/dL — ABNORMAL LOW (ref 8.9–10.3)
Calcium: 7.7 mg/dL — ABNORMAL LOW (ref 8.9–10.3)
Calcium: 7.5 mg/dL — ABNORMAL LOW (ref 8.9–10.3)
Chloride: 89 mmol/L — ABNORMAL LOW (ref 98–111)
Chloride: 89 mmol/L — ABNORMAL LOW (ref 98–111)
Chloride: 90 mmol/L — ABNORMAL LOW (ref 98–111)
Chloride: 91 mmol/L — ABNORMAL LOW (ref 98–111)
Creatinine, Ser: 0.46 mg/dL (ref 0.44–1.00)
Creatinine, Ser: 0.36 mg/dL — ABNORMAL LOW (ref 0.44–1.00)
Creatinine, Ser: 0.51 mg/dL (ref 0.44–1.00)
Creatinine, Ser: 0.49 mg/dL (ref 0.44–1.00)
GFR, Estimated: >60 mL/min (ref >60)
GFR, Estimated: >60 mL/min (ref >60)
GFR, Estimated: >60 mL/min (ref >60)
GFR, Estimated: >60 mL/min (ref >60)
Glucose, Bld: 116 mg/dL — ABNORMAL HIGH (ref 70–99)
Glucose, Bld: 117 mg/dL — ABNORMAL HIGH (ref 70–99)
Glucose, Bld: 192 mg/dL — ABNORMAL HIGH (ref 70–99)
Glucose, Bld: 122 mg/dL — ABNORMAL HIGH (ref 70–99)
Potassium: 3.8 mmol/L (ref 3.5–5.1)
Potassium: 3.7 mmol/L (ref 3.5–5.1)
Potassium: 4.4 mmol/L (ref 3.5–5.1)
Potassium: 4.4 mmol/L (ref 3.5–5.1)
Sodium: 122 mmol/L — ABNORMAL LOW (ref 135–145)
Sodium: 124 mmol/L — ABNORMAL LOW (ref 135–145)
Sodium: 123 mmol/L — ABNORMAL LOW (ref 135–145)
Sodium: 121 mmol/L — ABNORMAL LOW (ref 135–145)

## 2023-09-07 LAB — CBC
HCT: 23.3 % — ABNORMAL LOW (ref 36.0–46.0)
Hemoglobin: 7.8 g/dL — ABNORMAL LOW (ref 12.0–15.0)
MCH: 32 pg (ref 26.0–34.0)
MCHC: 33.5 g/dL (ref 30.0–36.0)
MCV: 95.5 fL (ref 80.0–100.0)
Platelets: 225 10*3/uL (ref 150–400)
RBC: 2.44 MIL/uL — ABNORMAL LOW (ref 3.87–5.11)
RDW: 12.3 % (ref 11.5–15.5)
WBC: 9.6 10*3/uL (ref 4.0–10.5)
nRBC: 0 % (ref 0.0–0.2)

## 2023-09-07 LAB — OSMOLALITY: Osmolality: 264 mosm/kg — ABNORMAL LOW (ref 275–295)

## 2023-09-07 NOTE — Progress Notes (Signed)
    Subjective:  Patient reports pain in her knee as mild. She reports some LLQ abdominal pain this morning. She has had several small bouts of diarrhea since Sunday after receiving suppository. Denies V/CP/SOB. She reports she feels some slight nausea, she has only had 2 doses of zofran since yesterday.   She reports she has not had much to eat due to lack of appetite. We again discussed to try and take the nausea medication prior to eating to try and help get some food on her stomach.    Objective:   VITALS:   Vitals:   09/06/23 1047 09/06/23 1134 09/06/23 1700 09/06/23 2146  BP: 114/61 129/76  131/69  Pulse: 79 90  89  Resp:  18  14  Temp: 99.1 F (37.3 C) 99.1 F (37.3 C)  98 F (36.7 C)  TempSrc:    Oral  SpO2: 98% 99% 97% 97%  Weight:      Height:        Patient lying in bed. NAD.  Neurologically intact Neurovascular intact Sensation intact distally Intact pulses distally Dorsiflexion/Plantar flexion intact Incision: dressing C/D/I No cellulitis present Compartment soft LLQ pain.   Lab Results  Component Value Date   WBC 9.6 09/07/2023   HGB 7.8 (L) 09/07/2023   HCT 23.3 (L) 09/07/2023   MCV 95.5 09/07/2023   PLT 225 09/07/2023   BMET    Component Value Date/Time   NA 122 (L) 09/07/2023 0322   NA 137 10/23/2022 1017   K 3.8 09/07/2023 0322   CL 89 (L) 09/07/2023 0322   CO2 23 09/07/2023 0322   GLUCOSE 116 (H) 09/07/2023 0322   BUN 10 09/07/2023 0322   BUN 16 10/23/2022 1017   CREATININE 0.46 09/07/2023 0322   CREATININE 0.62 07/20/2023 0816   CALCIUM 7.8 (L) 09/07/2023 0322   EGFR 95 07/08/2023 1249   EGFR 92 10/23/2022 1017   GFRNONAA >60 09/07/2023 0322   GFRNONAA 89 04/13/2017 0820     Assessment/Plan: 6 Days Post-Op   Principal Problem:   Degenerative arthritis of right knee Active Problems:   S/P total knee arthroplasty, right  Hyponatremia. Na++ 122 this morning has slowly trended downwards.   ABLA. Hemoglobin 7.8 this morning.  Continue to monitor.   WBAT with walker DVT ppx: Aspirin, SCDs, TEDS PO pain control She reports knee pain well controlled, only has had tylenol since yesterday.  PT/OT: Patient has had issues with dizziness with PT. Continue PT as able today.  Dispo:  - Patient having issues with abdominal pain and loose stools since Sunday after suppository. She has also been having issues with dizziness with PT. Sodium trending downwards. Consult to hospitalist placed this morning. Appreciate recommendations.  - D/c pending PT clearance and medial work-up.    Clois Dupes, PA-C 09/07/2023, 8:02 AM   [facility]  [place] [address], [place], [place] [address] Phone: [phone] [email or web address] Facebook  [employer]

## 2023-09-07 NOTE — Progress Notes (Signed)
PT TX NOTE  09/07/23 1700  PT Visit Information  Last PT Received On 09/07/23  Assistance Needed +1  Pt able to get BSC<> bed with nursing staff;   sitting EOB on PT arrival with c/o dizziness but not worsening;  sitting BP 118/73  standing BP at 1 minute 70/42;  pt returned to supine. Unable to progress mobility d/t being orthostatic.  Encouraged LE exercises and pt to keep HOB elevated. Continue PT POC and progress as able.   History of Present Illness 77 yo female presents to therapy s/p R TKA on 09/01/2023 pt with post op anemia, hyponatremia, nausea. Hospitalist consulted 09/07/23. 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  Subjective Data  Patient Stated Goal to be able to walk in the neighborhood, keep up and go out with my friends  Precautions  Precautions Knee;Fall  Precaution Booklet Issued No  Restrictions  Weight Bearing Restrictions No  RLE Weight Bearing WBAT  Pain Assessment  Pain Assessment 0-10  Pain Score 5  Pain Location R knee with knee flexion  Pain Descriptors / Indicators Discomfort;Guarding;Grimacing  Pain Intervention(s) Limited activity within patient's tolerance;Monitored during session;Premedicated before session;Repositioned;Ice applied;Patient requesting pain meds-RN notified  Cognition  Arousal Alert  Behavior During Therapy WFL for tasks assessed/performed  Overall Cognitive Status Within Functional Limits for tasks assessed  Bed Mobility  Overal bed mobility Needs Assistance  Bed Mobility Sit to Supine  Sit to supine Min assist  General bed mobility comments light assist to elevate RLE  Transfers  Overall transfer level Needs assistance  Equipment used Rolling walker (2 wheels)  Transfers Sit to/from Stand  Sit to Stand Contact guard assist  General transfer comment cues for hand placement  Ambulation/Gait  General  Gait Details unable d/t drop in BP  Total Joint Exercises  Ankle Circles/Pumps AROM;Both;10 reps  PT - End of Session  Activity Tolerance Treatment limited secondary to medical complications (Comment)  Patient left in bed;with call bell/phone within reach;with bed alarm set  Nurse Communication Patient requests pain meds   PT - Assessment/Plan  PT Visit Diagnosis Unsteadiness on feet (R26.81);Other abnormalities of gait and mobility (R26.89);Muscle weakness (generalized) (M62.81);Difficulty in walking, not elsewhere classified (R26.2);Pain  Pain - Right/Left Right  Pain - part of body Knee;Leg  PT Frequency (ACUTE ONLY) 7X/week  Follow Up Recommendations Follow physician's recommendations for discharge plan and follow up therapies  Patient can return home with the following A little help with walking and/or transfers;A little help with bathing/dressing/bathroom;Assistance with cooking/housework;Assist for transportation;Help with stairs or ramp for entrance  PT equipment None recommended by PT  AM-PAC PT "6 Clicks" Mobility Outcome Measure (Version 2)  Help needed turning from your back to your side while in a flat bed without using bedrails? 3  Help needed moving from lying on your back to sitting on the side of a flat bed without using bedrails? 3  Help needed moving to and from a bed to a chair (including a wheelchair)? 2  Help needed standing up from a chair using your arms (e.g., wheelchair or bedside chair)? 2  Help needed to walk in hospital room? 3  Help needed climbing 3-5 steps with a railing?  1  6 Click Score 14  Consider Recommendation of Discharge To: CIR/SNF/LTACH  PT Goal Progression  Progress towards PT goals Progressing toward goals  Acute Rehab PT  Goals  PT Goal Formulation With patient  Time For Goal Achievement 09/15/23  PT Time Calculation  PT Start Time (ACUTE ONLY) 1647  PT Stop Time (ACUTE ONLY) 1709  PT Time Calculation (min) (ACUTE ONLY) 22 min  PT General  Charges  $$ ACUTE PT VISIT 1 Visit  PT Treatments  $Therapeutic Activity 8-22 mins   Delice Bison, PT  Acute Rehab Dept [facility]) [phone]  09/07/2023

## 2023-09-07 NOTE — Plan of Care (Signed)
  Problem: Education: Goal: Knowledge of the prescribed therapeutic regimen will improve Outcome: Adequate for Discharge   Problem: Activity: Goal: Ability to avoid complications of mobility impairment will improve Outcome: Progressing Goal: Range of joint motion will improve Outcome: Adequate for Discharge   Problem: Clinical Measurements: Goal: Postoperative complications will be avoided or minimized Outcome: Progressing   Problem: Pain Management: Goal: Pain level will decrease with appropriate interventions Outcome: Adequate for Discharge   Problem: Health Behavior/Discharge Planning: Goal: Ability to manage health-related needs will improve Outcome: Progressing   Problem: Clinical Measurements: Goal: Ability to maintain clinical measurements within normal limits will improve Outcome: Progressing Goal: Will remain free from infection Outcome: Progressing Goal: Diagnostic test results will improve Outcome: Progressing Goal: Respiratory complications will improve Outcome: Adequate for Discharge   Problem: Activity: Goal: Risk for activity intolerance will decrease Outcome: Adequate for Discharge   Problem: Nutrition: Goal: Adequate nutrition will be maintained Outcome: Completed/Met   Problem: Coping: Goal: Level of anxiety will decrease Outcome: Progressing   Problem: Elimination: Goal: Will not experience complications related to bowel motility Outcome: Completed/Met Goal: Will not experience complications related to urinary retention Outcome: Completed/Met   Problem: Pain Managment: Goal: General experience of comfort will improve Outcome: Adequate for Discharge   Problem: Safety: Goal: Ability to remain free from injury will improve Outcome: Progressing   Problem: Skin Integrity: Goal: Risk for impaired skin integrity will decrease Outcome: Progressing

## 2023-09-07 NOTE — Progress Notes (Signed)
Physical Therapy Treatment Patient Details Name: Heather Hoover MRN: [id number] DOB: 1946/04/29 Today's Date: 09/07/2023   History of Present Illness 77 yo female presents to therapy s/p R TKA on 09/01/2023 pt with post op anemia, hyponatremia, nausea. Hospitalist consulted 09/07/23. 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    PT Comments  Pt reports feeling a little better today, was able to get up to [facility] with nursing staff; session focused on RLE exercises/strengthening and knee ROM. Pt tol well. Continue PT POC   If plan is discharge home, recommend the following: A little help with walking and/or transfers;A little help with bathing/dressing/bathroom;Assistance with cooking/housework;Assist for transportation;Help with stairs or ramp for entrance   Can travel by private vehicle        Equipment Recommendations  None recommended by PT    Recommendations for Other Services       Precautions / Restrictions Precautions Precautions: Knee;Fall Precaution Booklet Issued: No Restrictions Weight Bearing Restrictions: No RLE Weight Bearing: Weight bearing as tolerated     Mobility  Bed Mobility                    Transfers                        Ambulation/Gait                   Stairs             Wheelchair Mobility     Tilt Bed    Modified Rankin (Stroke Patients Only)       Balance                                            Cognition Arousal: Alert Behavior During Therapy: WFL for tasks assessed/performed Overall Cognitive Status: Within Functional Limits for tasks assessed                                          Exercises Total Joint Exercises Ankle Circles/Pumps: AROM, Both, 10 reps Quad Sets: AROM, Both, 10 reps, Supine Short Arc Quad: AAROM,  Strengthening, Right, 10 reps Heel Slides: AAROM, Right, 10 reps Hip ABduction/ADduction: AROM, AAROM, Right, 10 reps Straight Leg Raises: AAROM, AROM, Strengthening, Right, 10 reps    General Comments        Pertinent Vitals/Pain Pain Assessment Pain Assessment: 0-10 Pain Score: 4  Pain Location: R knee with knee flexion Pain Descriptors / Indicators: Discomfort, Guarding, Grimacing Pain Intervention(s): Limited activity within patient's tolerance, Monitored during session, Premedicated before session    Home Living                          Prior Function            PT Goals (current goals can now be found in the care plan section) Acute Rehab PT Goals Patient Stated Goal: to be able to walk in the neighborhood, keep up and go out with my friends PT Goal Formulation: With patient Time For Goal Achievement: 09/15/23 Progress towards PT goals: Progressing toward goals  Frequency    7X/week      PT Plan      Co-evaluation              AM-PAC PT "6 Clicks" Mobility   Outcome Measure  Help needed turning from your back to your side while in a flat bed without using bedrails?: A Little Help needed moving from lying on your back to sitting on the side of a flat bed without using bedrails?: A Little Help needed moving to and from a bed to a chair (including a wheelchair)?: A Little Help needed standing up from a chair using your arms (e.g., wheelchair or bedside chair)?: A Lot Help needed to walk in hospital room?: A Lot Help needed climbing 3-5 steps with a railing? : Total 6 Click Score: 14    End of Session   Activity Tolerance: Patient tolerated treatment well (exercises) Patient left: in bed;with call bell/phone within reach;with bed alarm set;with family/visitor present   PT Visit Diagnosis: Unsteadiness on feet (R26.81);Other abnormalities of gait and mobility (R26.89);Muscle weakness (generalized) (M62.81);Difficulty in walking, not  elsewhere classified (R26.2);Pain Pain - Right/Left: Right Pain - part of body: Knee;Leg     Time: 1513-1530 PT Time Calculation (min) (ACUTE ONLY): 17 min  Charges:    $Therapeutic Exercise: 8-22 mins PT General Charges $$ ACUTE PT VISIT: 1 Visit                     Yedidya Duddy, PT  Acute Rehab Dept [facility]) [phone]  09/07/2023    [facility] 09/07/2023, 3:38 PM

## 2023-09-07 NOTE — Consult Note (Signed)
[facility] Initial Consultation Note  UNITY PRUITT ZOX:[id number] DOB: [birth year] DOA: 09/01/2023  PCP: Donita Brooks, MD   Requesting Physician: Dr. Linna Caprice and Clint Bolder, PA-C   Reason for Consultation: Medical Management of Hyponatremia  HPI: Heather Hoover is a 77 y.o. female with medical history significant for hypertension, hyperlipidemia, osteoarthritis who underwent right total knee replacement with Dr. Linna Caprice on 9/11 without complication.  Since surgery, she has been recovering well with physical therapy, but over the last 48 hours she has developed some nausea, dizziness with ambulation.  Hospitalist consultation was requested today due to following sodium.  Review of the chart shows that sodium has dropped over the last few days from 134, 131, 129, 124, 122 this morning.  Hemoglobin has also been gradually drifting downwards.  Patient admits to very low appetite the last few days, she has been taking oral narcotics mainly on empty stomach.  Has also been drinking continue fluid because her mouth has been dry, but has eaten very little.  Yesterday, she ate a couple bites of oatmeal, most of a blueberry muffin, and one chicken tender the entire day.  Patient states pain control has been good.  She is not dizzy when resting in bed, but gets dizzy when she gets up to work with physical therapy.  She was feeling constipated a couple days ago, got a suppository and since that time has been having loose watery stools, denies any blood, feels like she needs to pass gas he is feeling bloated.  Does not have much of an appetite stable.   Review of Systems: Please see HPI for pertinent positives and negatives. A complete 10 system review of systems are otherwise negative.  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 10/28/2010   echo   Osteopenia 12/2018   T score -1.6 FRAX 10% / 1.5%   Past Surgical History:  Procedure Laterality Date   ABDOMINAL HYSTERECTOMY  02/07/1981   RSO, left one ovary   APPENDECTOMY     BREAST SURGERY     CARDIAC CATHETERIZATION     when in 3rd grade and age 40   CARPAL TUNNEL RELEASE Left 02/06/2021   Procedure: LEFT CARPAL TUNNEL RELEASE;  Surgeon: Cindee Salt, MD;  Location: [facility];  Service: Orthopedics;  Laterality: Left;  IV REGIONAL FOREARM BLOCK 45 MINUTES   CARPAL TUNNEL RELEASE Right 06/03/2021   Procedure: RIGHT CARPAL TUNNEL RELEASE;  Surgeon: Cindee Salt, MD;  Location: [facility];  Service: Orthopedics;  Laterality: Right;  45 MIN   COSMETIC SURGERY     forehead plastic surgery     MOH's surgery   FRACTURE SURGERY     right arm   KNEE ARTHROPLASTY Right 09/01/2023   Procedure: COMPUTER ASSISTED TOTAL KNEE ARTHROPLASTY;  Surgeon: Samson Frederic, MD;  Location: [facility];  Service: Orthopedics;  Laterality: Right;  160   KNEE ARTHROSCOPY     right    Social History:  reports that she has never smoked. She has never used smokeless tobacco. She reports current alcohol use. She reports that she does not use drugs.  Allergies  Allergen Reactions   Augmentin [  Amoxicillin-Pot Clavulanate] Diarrhea    Did it involve swelling of the face/tongue/throat, SOB, or low BP? yes Did it involve sudden or severe rash/hives, skin peeling, or any reaction on the inside of your mouth or nose? no Did you need to seek medical attention at a hospital or doctor's office? no When did it last happen?      2000 If all above answers are "NO", may proceed with cephalosporin use.    Niacin-Lovastatin Er     Flushing,itching,syncope   Kenalog [Triamcinolone] Palpitations    Family History  Problem Relation Age of Onset   Hypertension Mother    Breast cancer Mother 49        Breast    Cancer Mother        uterine thye think   Cancer Father 16       prostate metastized to bone   Hypertension Father    Arthritis Father    Hypertension Sister    Breast cancer Sister 34   Cancer Sister 28       uterine   Heart failure Sister    Muscular dystrophy Brother      Prior to Admission medications   Medication Sig Start Date End Date Taking? Authorizing Provider  acetaminophen (TYLENOL) 325 MG tablet Take 650 mg by mouth every 6 (six) hours as needed (pain.).   Yes [provider]  alendronate (FOSAMAX) 70 MG tablet Take 1 tablet (70 mg total) by mouth every 7 (seven) days. Take with a full glass of water on an empty stomach. Patient taking differently: Take 70 mg by mouth every Thursday. Take with a full glass of water on an empty stomach. 02/02/23  Yes Donita Brooks, MD  aspirin (ASPIRIN CHILDRENS) 81 MG chewable tablet Chew 1 tablet (81 mg total) by mouth 2 (two) times daily with a meal. 09/02/23 10/17/23 Yes Hill, Alain Honey, PA-C  bismuth subsalicylate (PEPTO BISMOL) 262 MG chewable tablet Chew 262-524 mg by mouth 4 (four) times daily as needed for diarrhea or loose stools or indigestion.   Yes [provider]  bismuth subsalicylate (PEPTO BISMOL) 262 MG/15ML suspension Take 30 mLs by mouth every 6 (six) hours as needed for diarrhea or loose stools or indigestion.   Yes [provider]  carboxymethylcellulose (REFRESH PLUS) 0.5 % SOLN Place 1 drop into both eyes 3 (three) times daily as needed (dry/irritated eyes.).   Yes [provider]  carvedilol (COREG) 6.25 MG tablet TAKE 1 TABLET(6.25 MG) BY MOUTH TWICE DAILY WITH A MEAL 05/04/23  Yes Chilton Si, MD  Cholecalciferol (VITAMIN D-3 PO) Take 2,000 Units by mouth in the morning.   Yes [provider]  clotrimazole (LOTRIMIN) 1 % external solution Apply 1 Application topically 2 (two) times daily as needed (toe nail fungus).   Yes [provider]   diclofenac Sodium (VOLTAREN) 1 % GEL Apply 1 Application topically 4 (four) times daily as needed (pain).   Yes [provider]  docusate sodium (COLACE) 100 MG capsule Take 1 capsule (100 mg total) by mouth 2 (two) times daily. 09/02/23 10/02/23 Yes Hill, Alain Honey, PA-C  Famotidine (PEPCID PO) Take 10 mg by mouth in the morning.   Yes [provider]  meloxicam (MOBIC) 15 MG tablet Take 1 tablet (15 mg total) by mouth daily. 08/13/23  Yes Donita Brooks, MD  Menthol-Methyl Salicylate (SALONPAS PAIN RELIEF PATCH) 3-10 % PTCH Apply topically.   Yes [provider]  ondansetron (ZOFRAN) 4 MG tablet  Take 1 tablet (4 mg total) by mouth every 8 (eight) hours as needed for nausea or vomiting. 09/02/23 09/01/24 Yes Hill, Alain Honey, PA-C  oxyCODONE (ROXICODONE) 5 MG immediate release tablet Take 1 tablet (5 mg total) by mouth every 4 (four) hours as needed for severe pain. 09/02/23  Yes Hill, Alain Honey, PA-C  polyethylene glycol (MIRALAX) 17 g packet Take 17 g by mouth daily as needed for mild constipation or moderate constipation. 09/02/23 10/02/23 Yes Hill, Alain Honey, PA-C  potassium chloride (KLOR-CON) 10 MEQ tablet TAKE 1 TABLET(10 MEQ) BY MOUTH DAILY Patient not taking: Reported on 08/19/2023 05/04/23  Yes Chilton Si, MD  PREVIDENT 5000 DRY MOUTH 1.1 % GEL dental gel Place 1 Application onto teeth in the morning. 02/20/22  Yes [provider]  rosuvastatin (CRESTOR) 20 MG tablet TAKE 1 TABLET(20 MG) BY MOUTH DAILY Patient taking differently: Take 20 mg by mouth at bedtime. 08/02/23  Yes Chilton Si, MD  senna (SENOKOT) 8.6 MG TABS tablet Take 2 tablets (17.2 mg total) by mouth at bedtime for 15 days. 09/02/23 09/17/23 Yes Hill, Alain Honey, PA-C  valsartan (DIOVAN) 320 MG tablet Take 1 tablet (320 mg total) by mouth daily. Patient taking differently: Take 320 mg by mouth at bedtime. 07/15/23  Yes Chilton Si, MD    Physical Exam: BP 131/69 (BP Location: Right Arm)    Pulse 89   Temp 98 F (36.7 C) (Oral)   Resp 14   Ht 5\' 4"  (1.626 m)   Wt 82.6 kg   SpO2 97%   BMI 31.26 kg/m   General:  Alert, oriented, calm, in no acute distress, resting comfortably with her husband at the bedside.  Overall on exam she looks dry.  Mucous membranes are dry. Eyes: EOMI, clear conjuctivae, white sclerea Neck: supple, no masses, trachea mildline  Cardiovascular: RRR, no murmurs or rubs, no peripheral edema  Respiratory: clear to auscultation bilaterally, no wheezes, no crackles  Abdomen: soft, palpable stool and tenderness in the left lower quadrant, no obvious distention, no rebound or guarding Skin: dry, no rashes  Musculoskeletal: no joint effusions, normal range of motion  Psychiatric: appropriate affect, normal speech  Neurologic: extraocular muscles intact, clear speech, moving all extremities with intact sensorium         Recent Labs and Imaging Reviewed:  Basic Metabolic Panel: Recent Labs  Lab 09/02/23 0303 09/03/23 0308 09/06/23 0818 09/07/23 0322  NA 131* 129* 124* 122*  K 3.7 3.6 3.6 3.8  CL 97* 97* 91* 89*  CO2 27 26 25 23   GLUCOSE 121* 124* 115* 116*  BUN 11 8 9 10   CREATININE 0.56 0.54 0.56 0.46  CALCIUM 8.3* 7.9* 7.9* 7.8*   Liver Function Tests: No results for input(s): "AST", "ALT", "ALKPHOS", "BILITOT", "PROT", "ALBUMIN" in the last 168 hours. No results for input(s): "LIPASE", "AMYLASE" in the last 168 hours. No results for input(s): "AMMONIA" in the last 168 hours. CBC: Recent Labs  Lab 09/02/23 0303 09/03/23 0308 09/06/23 0818 09/07/23 0322  WBC 5.1 7.3 7.9 9.6  HGB 9.7* 8.3* 8.2* 7.8*  HCT 29.9* 25.5* 24.9* 23.3*  MCV 99.0 97.7 96.5 95.5  PLT 116* 108* 204 225   Cardiac Enzymes: No results for input(s): "CKTOTAL", "CKMB", "CKMBINDEX", "TROPONINI" in the last 168 hours.  BNP (last 3 results) No results for input(s): "BNP" in the last 8760 hours.  ProBNP (last 3 results) No results for input(s): "PROBNP" in the  last 8760 hours.  CBG: No results for input(s): "  GLUCAP" in the last 168 hours.  Radiological Exams on Admission: No results found.  Summary and Recommendations: This is a pleasant 77 year old female with a history of hypertension, hyperlipidemia, osteoarthritis who underwent uncomplicated right total knee replacement with Dr. Linna Caprice on 9/11 and has been since dealing with some nausea, dizziness with ambulation, and now gradually worsening hyponatremia for which hospitalist consultation is requested.  Hyponatremia-I suspect this is a hypovolemic hyponatremia, exacerbated by some mild polydipsia.  She has had IV fluids ordered since the surgery, but patient and primary team confirmed that she has not consistently been receiving IV fluids.  She did receive a fluid bolus yesterday. -Check urinalysis, urine awesome, serum awesome -Start normal saline infusion at 75 cc/h (sent secure chat request to RN) -fluid restriction 1500 CC per 24 hours -Monitor sodium level closely, with labs every 6 hours for the next 24 hours  Dizziness-patient with completely normal mental status, I think her dizziness is likely orthostatic hypotension, due to hypovolemia.  I doubt that dizziness is caused by her hyponatremia. -Continue normal saline infusion as above -Check orthostatics every shift  Nausea and abdominal discomfort-nausea is I think most likely due to oral narcotics on an empty stomach.  I think she also may still be constipated based on her exam, and this is contributing to nausea.  She may also have developed an ileus from narcotic use. -Discussed with patient trying to minimize narcotics -Check 1 view abdomen XR -Due to watery diarrhea will discontinue stimulant senna, continue docusate -Hydration as above may also help help  Hypertension-continue Coreg, aspirin  Hyperlipidemia-continue Crestor  Thank you for involving [place] in the care of your patient.  [facility] will continue to  follow along with you.  Time spent: 59 minutes  Rigley Niess Sharlette Dense MD [facility] Pager [phone]  If 7PM-7AM, please contact night-coverage [email or web address] Password [email or web address]  09/07/2023, 8:59 AM

## 2023-09-08 LAB — CBC
HCT: 23.9 % — ABNORMAL LOW (ref 36.0–46.0)
Hemoglobin: 7.8 g/dL — ABNORMAL LOW (ref 12.0–15.0)
MCH: 31.6 pg (ref 26.0–34.0)
MCHC: 32.6 g/dL (ref 30.0–36.0)
MCV: 96.8 fL (ref 80.0–100.0)
Platelets: 251 10*3/uL (ref 150–400)
RBC: 2.47 MIL/uL — ABNORMAL LOW (ref 3.87–5.11)
RDW: 12.6 % (ref 11.5–15.5)
WBC: 7.7 10*3/uL (ref 4.0–10.5)
nRBC: 0 % (ref 0.0–0.2)

## 2023-09-08 NOTE — Plan of Care (Signed)
Problem: Education: Goal: Knowledge of the prescribed therapeutic regimen will improve Outcome: Progressing   Problem: Activity: Goal: Ability to avoid complications of mobility impairment will improve Outcome: Progressing Goal: Range of joint motion will improve Outcome: Progressing   Problem: Clinical Measurements: Goal: Postoperative complications will be avoided or minimized Outcome: Progressing

## 2023-09-08 NOTE — Progress Notes (Addendum)
Physical Therapy Treatment Patient Details Name: Heather Hoover MRN: [id number] DOB: [birth year] Today's Date: 09/08/2023   History of Present Illness 77 yo female presents to therapy s/p R TKA on 09/01/2023 pt with post op anemia, hyponatremia, nausea. Hospitalist consulted 09/07/23. 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  Feeling "a little better".  Still "not eating much" due to nausea (improving).  Pain meds changed from OXY to [facility] showing improvement in decreased c/o dizziness/nausea. Assisted OOB.  General bed mobility comments: demonstarted and instructed on how to use belt to self guide LE off bed.  Able with increased time.  General transfer comment: cues for hand placement, safety with turns and increased time due to mild dizziness.  General Gait Details: assisted with amb to and from bathroom only this session 15 feet x 2 due to max c/o fatigue and increased dizziness with activity.  (not as severe as days prior/improving)  Required extended rest break while seated on toilet.  Assisted with peri care as pt was unable to stand/balance safely without letting go B hands on walker.  Presented with one posterior LOB during activity of pulling depends up/Therapist recovered.  Pt had to sit back down on toilet to rest before attempting amb back to recliner.  Mild dizziness with c/o "fuzzy vision" with exerted activity.   Upon seated in recliner, pt present with Ocular Nystagmus.  Apparently this has happened before with her eyes as she stated "there goes my vision again".  Positioned in recliner and performed a few TKR TE's followed by ICE. Will see pt again this afternoon in hopes to get her amb in hallway and practice stairs.    If plan is  discharge home, recommend the following: A little help with walking and/or transfers;A little help with bathing/dressing/bathroom;Assistance with cooking/housework;Assist for transportation;Help with stairs or ramp for entrance   Can travel by private vehicle        Equipment Recommendations  None recommended by PT    Recommendations for Other Services       Precautions / Restrictions Precautions Precautions: Knee;Fall Precaution Comments: no pillow under knee Restrictions Weight Bearing Restrictions: No RLE Weight Bearing: Weight bearing as tolerated     Mobility  Bed Mobility Overal bed mobility: Needs Assistance Bed Mobility: Supine to Sit Rolling: Contact guard assist, Supervision         General bed mobility comments: demonstarted and instructed on how to use belt to self guide LE off bed.  Able with increased time.    Transfers Overall transfer level: Needs assistance Equipment used: Rolling walker (2 wheels) Transfers: Sit to/from Stand Sit to Stand: Contact guard assist   Step pivot transfers: Contact guard assist       General transfer comment: cues for hand placement, safety with turns and increased time due to mild dizziness    Ambulation/Gait Ambulation/Gait assistance: Contact guard assist Gait Distance (Feet): 30 Feet (15 feet x 2) Assistive device: Rolling walker (2 wheels) Gait Pattern/deviations: Step-to pattern, Decreased step length - right, Decreased step length - left, Shuffle, Trunk flexed Gait velocity: decreased     General Gait Details: assisted with amb to and from bathroom only this session 15 feet x 2 due to max c/o  fatigue and increased dizziness with activity.  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  Feeling "a little better".  Still "not eating much" due to nausea (improving).  Pain meds changed from OXY to [facility] showing improvement in decreased c/o dizziness/nausea.        Exercises  10 reps B LE AP and knee presses 10 reps R LE knee flex 0 - 70 degrees with min pain     General Comments        Pertinent Vitals/Pain Pain Assessment Pain Assessment: 0-10 Pain Score: 2  Pain Location: R knee Pain Descriptors / Indicators: Discomfort, Guarding, Grimacing Pain Intervention(s): Monitored during session, Premedicated before session, Repositioned, Ice applied    Home Living                          Prior Function            PT Goals (current goals can now be found in the care plan section) Progress towards PT goals: Progressing toward goals    Frequency    7X/week      PT Plan      Co-evaluation              AM-PAC PT "6 Clicks" Mobility   Outcome Measure  Help needed turning from your back to your side while in a flat bed without using bedrails?: A Little Help needed moving from lying on your back to sitting on the side of a flat bed without using bedrails?: A Little Help needed moving to and from a bed to a chair (including a wheelchair)?: A Little Help needed standing up from a chair using your arms (e.g., wheelchair or bedside chair)?: A Little Help needed to walk in hospital room?: A Lot Help needed climbing 3-5 steps with a railing? : A Lot 6 Click Score: 16    End of Session Equipment Utilized During Treatment: Gait belt Activity Tolerance: Patient limited by fatigue;Other (comment) (dizziness) Patient left: in chair;with call bell/phone within reach;with chair alarm set;with family/visitor  present Nurse Communication: Mobility status PT Visit Diagnosis: Unsteadiness on feet (R26.81);Other abnormalities of gait and mobility (R26.89);Muscle weakness (generalized) (M62.81);Difficulty in walking, not elsewhere classified (R26.2);Pain Pain - Right/Left: Right Pain - part of body: Knee;Leg     Time: 4098-1191 PT Time Calculation (min) (ACUTE ONLY): 50 min  Charges:    $Gait Training: 8-22 mins $Therapeutic Exercise: 8-22 mins $Therapeutic Activity: 8-22 mins PT General Charges $$ ACUTE PT VISIT: 1 Visit                     Felecia Shelling  PTA Acute  Rehabilitation Services Office M-F          [phone]

## 2023-09-08 NOTE — Care Management Important Message (Signed)
Important Message  Patient Details IM Letter given. Name: Heather Hoover MRN: [id number] Date of Birth: Sep 23, 1946   Medicare Important Message Given:  Yes     Caren Macadam 09/08/2023, 1:24 PM

## 2023-09-08 NOTE — Progress Notes (Signed)
    Subjective:  Patient reports pain as mild to moderate.  Denies N/V/CP/SOB. She reports that she is feeling a little better today. Abd discomfort improved slightly today.  Still having some issues with orthostatic hypotension with PT. Continue to push oral intake.   Objective:   VITALS:   Vitals:   09/06/23 2146 09/07/23 1355 09/07/23 2305 09/08/23 0616  BP: 131/69 130/68 123/70 (!) 144/68  Pulse: 89 88 89 91  Resp: 14 16 15 16   Temp: 98 F (36.7 C) 98.2 F (36.8 C) 98.8 F (37.1 C) 98.9 F (37.2 C)  TempSrc: Oral Oral  Oral  SpO2: 97% 100% 97% 99%  Weight:      Height:        Patient is lying in bed. NAD.  Neurologically intact ABD soft Neurovascular intact Sensation intact distally Intact pulses distally Dorsiflexion/Plantar flexion intact Incision: dressing C/D/I No cellulitis present Compartment soft   Lab Results  Component Value Date   WBC 9.6 09/07/2023   HGB 7.8 (L) 09/07/2023   HCT 23.3 (L) 09/07/2023   MCV 95.5 09/07/2023   PLT 225 09/07/2023   BMET    Component Value Date/Time   NA 123 (L) 09/08/2023 0305   NA 137 10/23/2022 1017   K 4.2 09/08/2023 0305   CL 90 (L) 09/08/2023 0305   CO2 27 09/08/2023 0305   GLUCOSE 109 (H) 09/08/2023 0305   BUN 12 09/08/2023 0305   BUN 16 10/23/2022 1017   CREATININE 0.39 (L) 09/08/2023 0305   CREATININE 0.62 07/20/2023 0816   CALCIUM 7.7 (L) 09/08/2023 0305   EGFR 95 07/08/2023 1249   EGFR 92 10/23/2022 1017   GFRNONAA >60 09/08/2023 0305   GFRNONAA 89 04/13/2017 0820     Assessment/Plan: 7 Days Post-Op   Principal Problem:   Degenerative arthritis of right knee Active Problems:   S/P total knee arthroplasty, right    WBAT with walker DVT ppx: Aspirin, SCDs, TEDS PO pain control PT/OT: Slow progression with PT due to orthostatic hypotension. Continue PT.  Dispo:  - Hospitalist following, appreciate their recommendations.  - D/c pending medical clearance and PT clearance.    Clois Dupes, PA-C 09/08/2023, 8:20 AM   [facility]  [facility] [address], [address], [place], [place] [address] Phone: [phone] [email or web address] Facebook  [employer]

## 2023-09-08 NOTE — Progress Notes (Signed)
Physical Therapy Treatment Patient Details Name: Heather Hoover MRN: [id number] DOB: Jul 20, 1946 Today's Date: 09/08/2023   History of Present Illness 77 yo female presents to therapy s/p R TKA on 09/01/2023 pt with post op anemia, hyponatremia, nausea. Hospitalist consulted 09/07/23. 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  Feeling "a little better".  Still "not eating much" due to nausea (improving).  Pain meds changed from OXY to [facility] showing improvement in decreased c/o dizziness/nausea.   PT Comments  POD # 7 pm session Pt improving.  Encouraged "no pure wick during day". Pt has amb a total of 4 times today (2 with PT/2 with NT) to bathroom to increase her activity/mobility.  Fatigue is her biggest complaint.  Pain is well controlled.  Dizziness is decreasing.   Feeling "better".   General Gait Details: assisted with amb to and from bathroom only this session 15 feet x 2 due to max c/o fatigue and increased dizziness with activity.  (not as severe as days prior/improving) improving.  VC's to "breath" during activity as pt tends to hold her breath. General bed mobility comments: demonstarted and instructed on how to use belt to self guide LE back up onto bed.  Able with increased time.  Spouse observed. Pt has NOT met her mobility goals to D/C today.   Have arranged to meet Spouse and Son in morning for "Family Training" on all mobility including stair training as well as HEP in anticipation for D/C to home tomorrow.      If plan is discharge home, recommend the following: A little help with walking and/or transfers;A little help with bathing/dressing/bathroom;Assistance with cooking/housework;Assist for transportation;Help with stairs or ramp for entrance   Can travel by  private vehicle        Equipment Recommendations  None recommended by PT    Recommendations for Other Services       Precautions / Restrictions Precautions Precautions: Knee;Fall Precaution Comments: no pillow under knee Restrictions Weight Bearing Restrictions: No RLE Weight Bearing: Weight bearing as tolerated     Mobility  Bed Mobility Overal bed mobility: Needs Assistance Bed Mobility: Sit to Supine Rolling: Contact guard assist, Supervision     Sit to supine: Supervision, Contact guard assist   General bed mobility comments: demonstarted and instructed on how to use belt to self guide LE back up onto bed.  Able with increased time.  Spouse observed.    Transfers Overall transfer level: Needs assistance Equipment used: Rolling walker (2 wheels) Transfers: Sit to/from Stand Sit to Stand: Contact guard assist, Supervision   Step pivot transfers: Contact guard assist       General transfer comment: cues for hand placement, safety with turns and increased time due to mild dizziness.  asissted with a toilet transfer    Ambulation/Gait Ambulation/Gait assistance: Contact guard assist Gait Distance (Feet): 30 Feet Assistive device: Rolling walker (2 wheels) Gait Pattern/deviations: Step-to pattern, Decreased step length - right, Decreased step length - left, Shuffle, Trunk flexed Gait velocity: decreased     General Gait Details: assisted with amb to and from bathroom only this session 15 feet x 2 due to max c/o fatigue and increased dizziness with activity.  (not as severe as days prior/improving) improving.  VC's to "breath" during activity as pt tends  to hold her breath.   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  Feeling "a little better".  Still "not eating much" due to nausea (improving).  Pain meds changed from OXY to [facility] showing improvement in decreased c/o dizziness/nausea.        Exercises      General Comments        Pertinent Vitals/Pain Pain Assessment Pain Assessment: 0-10 Pain Score: 3  Pain Location: R knee Pain Descriptors / Indicators: Discomfort, Guarding, Grimacing Pain Intervention(s): Monitored during session, Repositioned    Home Living                          Prior Function            PT Goals (current goals can now be found in the care plan section) Progress towards PT goals: Progressing toward goals    Frequency    7X/week      PT Plan      Co-evaluation              AM-PAC PT "6 Clicks" Mobility   Outcome Measure  Help needed turning from your back to your side while in a flat bed without using bedrails?: A Little Help needed moving from lying on your back to sitting on the side of a flat bed without using bedrails?: A Little Help needed moving to and from a bed to a chair (including a wheelchair)?: A Little Help needed standing up from a chair using your arms (e.g., wheelchair or bedside chair)?: A Little Help needed to walk in hospital room?: A Little Help needed climbing 3-5 steps with a railing? : A Lot 6 Click Score: 17    End of Session Equipment Utilized During Treatment: Gait belt Activity Tolerance: Patient limited by fatigue Patient left: in bed;with call bell/phone within reach;with bed alarm set;with family/visitor present Nurse Communication: Mobility status PT Visit Diagnosis: Unsteadiness on feet (R26.81);Other abnormalities of gait and mobility (R26.89);Muscle weakness (generalized) (M62.81);Difficulty in walking, not elsewhere  classified (R26.2);Pain Pain - Right/Left: Right Pain - part of body: Knee     Time: 6440-3474 PT Time Calculation (min) (ACUTE ONLY): 41 min  Charges:    $Gait Training: 8-22 mins $Therapeutic Exercise: 8-22 mins $Therapeutic Activity: 23-37 mins PT General Charges $$ ACUTE PT VISIT: 1 Visit                     Felecia Shelling  PTA Acute  Rehabilitation Services Office M-F          [phone]

## 2023-09-09 DIAGNOSIS — I951 Orthostatic hypotension: Secondary | ICD-10-CM | POA: Diagnosis not present

## 2023-09-09 LAB — URINALYSIS, ROUTINE W REFLEX MICROSCOPIC
Bilirubin Urine: NEGATIVE
Glucose, UA: NEGATIVE mg/dL
Hgb urine dipstick: NEGATIVE
Ketones, ur: NEGATIVE mg/dL
Leukocytes,Ua: NEGATIVE
Nitrite: NEGATIVE
Protein, ur: NEGATIVE mg/dL
Specific Gravity, Urine: 1.014 (ref 1.005–1.030)
pH: 6 (ref 5.0–8.0)

## 2023-09-09 LAB — CBC
HCT: 24.4 % — ABNORMAL LOW (ref 36.0–46.0)
Hemoglobin: 7.8 g/dL — ABNORMAL LOW (ref 12.0–15.0)
MCH: 31.1 pg (ref 26.0–34.0)
MCHC: 32 g/dL (ref 30.0–36.0)
MCV: 97.2 fL (ref 80.0–100.0)
Platelets: 302 10*3/uL (ref 150–400)
RBC: 2.51 MIL/uL — ABNORMAL LOW (ref 3.87–5.11)
RDW: 12.8 % (ref 11.5–15.5)
WBC: 7.2 10*3/uL (ref 4.0–10.5)
nRBC: 0 % (ref 0.0–0.2)

## 2023-09-09 LAB — BASIC METABOLIC PANEL
Anion gap: 10 (ref 5–15)
BUN: 12 mg/dL (ref 8–23)
CO2: 25 mmol/L (ref 22–32)
Calcium: 8 mg/dL — ABNORMAL LOW (ref 8.9–10.3)
Chloride: 92 mmol/L — ABNORMAL LOW (ref 98–111)
Creatinine, Ser: 0.45 mg/dL (ref 0.44–1.00)
GFR, Estimated: >60 mL/min (ref >60)
Glucose, Bld: 111 mg/dL — ABNORMAL HIGH (ref 70–99)
Potassium: 4 mmol/L (ref 3.5–5.1)
Sodium: 127 mmol/L — ABNORMAL LOW (ref 135–145)

## 2023-09-09 LAB — SODIUM, URINE, RANDOM: Sodium, Ur: 32 mmol/L

## 2023-09-09 LAB — OSMOLALITY, URINE: Osmolality, Ur: 453 mOsm/kg (ref 300–900)

## 2023-09-09 MED ORDER — SODIUM CHLORIDE 0.9 % IV SOLN: INTRAVENOUS | Status: DC

## 2023-09-09 MED ORDER — BISMUTH SUBSALICYLATE 262 MG/15ML PO SUSP
30.0000 mL | ORAL | Status: DC | PRN
  Filled 2023-09-09: qty 236

## 2023-09-09 MED ORDER — ACETAMINOPHEN 500 MG PO TABS
1000.0000 mg | ORAL_TABLET | Freq: Three times a day (TID) | ORAL | Status: DC
  Administered 2023-09-09 – 2023-09-13 (×13): 1000 mg via ORAL
  Filled 2023-09-09 (×13): qty 2

## 2023-09-09 NOTE — Progress Notes (Signed)
Physical Therapy Treatment Patient Details Name: Heather Hoover MRN: [id number] DOB: [birth year] Today's Date: 09/09/2023   History of Present Illness 77 yo female presents to therapy s/p R TKA on 09/01/2023 pt with post op anemia, hyponatremia, nausea. Hospitalist consulted 09/07/23. 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    PT Comments  POD # 8 am session Pt reported a "good night".  This morning, pt got dizzy in bathroom with NT.    Near Syncope episode while amb with Physical Therapy only after 11 feet. BP in recliner 128/74  HR 88 Attempted standing BP but pt was unable to tolerate with MAX c/o dizziness and knees buckling.  BP seated in recliner 89/46  HR 76 Reported to RN   Pt returned to room in recliner.  Recovery BP post min in recliner  BP 103/59 HR 77   Pt did NOT meet her mobility goals to D/C to home today.     If plan is discharge home, recommend the following: A little help with walking and/or transfers;A little help with bathing/dressing/bathroom;Assistance with cooking/housework;Assist for transportation;Help with stairs or ramp for entrance   Can travel by private vehicle        Equipment Recommendations  None recommended by PT    Recommendations for Other Services       Precautions / Restrictions Precautions Precautions: Knee;Fall Precaution Comments: no pillow under knee Restrictions Weight Bearing Restrictions: No RLE Weight Bearing: Weight bearing as tolerated     Mobility  Bed Mobility Overal bed mobility: Needs Assistance Bed Mobility: Supine to Sit Rolling: Supervision         General bed mobility comments: demonstarted and instructed on how to use belt to self guide LE.  Able with increased time.  Spouse and Son observed.    Transfers Overall transfer level: Needs assistance Equipment used: Rolling  walker (2 wheels) Transfers: Sit to/from Stand Sit to Stand: Contact guard assist, Supervision   Step pivot transfers: Contact guard assist       General transfer comment: cues for hand placement, safety with turns and increased time due to mild dizziness.    Ambulation/Gait Ambulation/Gait assistance: Contact guard assist Gait Distance (Feet): 11 Feet Assistive device: Rolling walker (2 wheels) Gait Pattern/deviations: Step-to pattern, Decreased step length - right, Decreased step length - left, Shuffle, Trunk flexed       General Gait Details: limited amb distance of 11 fett before pt c/o "dizziness".  Recliner following.  attempted but unable to receive a standing BP due to near syncopal episode.  "going black" reported pt.   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  Pt reported a "good night" but got "dizzy" when she went to the bathroom with the NT        Exercises      General Comments        Pertinent Vitals/Pain Pain Assessment Pain Assessment: 0-10 Pain Score: 3  Pain Location: R knee Pain Descriptors / Indicators: Discomfort, Guarding, Grimacing Pain Intervention(s): Monitored during session, Premedicated before session, Repositioned, Ice applied    Home Living                          Prior Function            PT Goals (current goals can now be found in the care plan section) Progress towards PT goals: Progressing toward goals    Frequency    7X/week      PT Plan      Co-evaluation              AM-PAC PT "6 Clicks" Mobility   Outcome Measure  Help needed  turning from your back to your side while in a flat bed without using bedrails?: A Little Help needed moving from lying on your back to sitting on the side of a flat bed without using bedrails?: A Little Help needed moving to and from a bed to a chair (including a wheelchair)?: A Little Help needed standing up from a chair using your arms (e.g., wheelchair or bedside chair)?: A Little Help needed to walk in hospital room?: A Little Help needed climbing 3-5 steps with a railing? : A Lot 6 Click Score: 17    End of Session Equipment Utilized During Treatment: Gait belt Activity Tolerance: Patient limited by fatigue Patient left: in chair;with call bell/phone within reach;with chair alarm set;with family/visitor present Nurse Communication: Mobility status PT Visit Diagnosis: Unsteadiness on feet (R26.81);Other abnormalities of gait and mobility (R26.89);Muscle weakness (generalized) (M62.81);Difficulty in walking, not elsewhere classified (R26.2);Pain Pain - Right/Left: Right Pain - part of body: Knee     Time: 1610-9604 PT Time Calculation (min) (ACUTE ONLY): 29 min  Charges:    $Gait Training: 8-22 mins $Therapeutic Activity: 8-22 mins PT General Charges $$ ACUTE PT VISIT: 1 Visit                     Felecia Shelling  PTA Acute  Rehabilitation Services Office M-F          [phone]

## 2023-09-09 NOTE — Plan of Care (Signed)
  Problem: Activity: Goal: Ability to avoid complications of mobility impairment will improve Outcome: Progressing Goal: Range of joint motion will improve Outcome: Progressing   Problem: Clinical Measurements: Goal: Postoperative complications will be avoided or minimized Outcome: Progressing   Problem: Clinical Measurements: Goal: Ability to maintain clinical measurements within normal limits will improve Outcome: Progressing Goal: Will remain free from infection Outcome: Progressing Goal: Diagnostic test results will improve Outcome: Progressing

## 2023-09-09 NOTE — Progress Notes (Signed)
Physical Therapy  Pt c/o dizziness when amb to bathroom with NT earlier.  Near Syncope episode while amb with Physical Therapy only after 11 feet. BP in recliner 128/74  HR 88 Attempted standing BP but pt was unable to tolerate with MAX c/o dizziness and knees buckling.  BP seated in recliner 89/46  HR 76 Reported to RN  Pt returned to room in recliner.  Recovery BP post min in recliner  BP 103/59 HR 77  Pt did NOT meet her mobility goals to D/C to home today.  Felecia Shelling  PTA Acute  Rehabilitation Services Office M-F          [phone] .

## 2023-09-09 NOTE — Progress Notes (Signed)
    Subjective:  Patient reports pain as mild to moderate.  Denies N/V/CP/SOB. ABD pain improving. She reports improvement of nausea yesterday. She was able to eat and drink more. Felt better with PT yesterday.   Objective:   VITALS:   Vitals:   09/08/23 0616 09/08/23 1340 09/08/23 2117 09/09/23 0531  BP: (!) 144/68 116/61 113/62 133/70  Pulse: 91 92 90 93  Resp: 16 18 15 14   Temp: 98.9 F (37.2 C) 97.9 F (36.6 C) 98.4 F (36.9 C) 98.8 F (37.1 C)  TempSrc: Oral     SpO2: 99% 96% 97% 98%  Weight:      Height:        Patient lying in bed. NAD Neurologically intact ABD soft Neurovascular intact Sensation intact distally Intact pulses distally Dorsiflexion/Plantar flexion intact Incision: dressing C/D/I No cellulitis present Compartment soft   Lab Results  Component Value Date   WBC 7.7 09/08/2023   HGB 7.8 (L) 09/08/2023   HCT 23.9 (L) 09/08/2023   MCV 96.8 09/08/2023   PLT 251 09/08/2023   BMET    Component Value Date/Time   NA 123 (L) 09/08/2023 0305   NA 137 10/23/2022 1017   K 4.2 09/08/2023 0305   CL 90 (L) 09/08/2023 0305   CO2 27 09/08/2023 0305   GLUCOSE 109 (H) 09/08/2023 0305   BUN 12 09/08/2023 0305   BUN 16 10/23/2022 1017   CREATININE 0.39 (L) 09/08/2023 0305   CREATININE 0.62 07/20/2023 0816   CALCIUM 7.7 (L) 09/08/2023 0305   EGFR 95 07/08/2023 1249   EGFR 92 10/23/2022 1017   GFRNONAA >60 09/08/2023 0305   GFRNONAA 89 04/13/2017 0820     Assessment/Plan: 8 Days Post-Op   Principal Problem:   Degenerative arthritis of right knee Active Problems:   S/P total knee arthroplasty, right  ABLA. Hemoglobin 7.8 yesterday. Continue to monitor.   Hyponatremia. Suspected due to hypovolemia. Continue hydration and oral intake.   WBAT with walker DVT ppx: Aspirin, SCDs, TEDS PO pain control PT/OT: Patient improved with PT yesterday. Continue PT today.  Dispo: D/c home once cleared with PT.    Clois Dupes, PA-C 09/09/2023, 7:13  AM   [facility]  [place] [address], [address], [place], [place] [address] Phone: [phone] [email or web address] Facebook  [employer]

## 2023-09-09 NOTE — Progress Notes (Signed)
Physical Therapy Treatment Patient Details Name: Heather Hoover MRN: [id number] DOB: 1945-12-25 Today's Date: 09/09/2023   History of Present Illness 77 yo female presents to therapy s/p R TKA on 09/01/2023 pt with post op anemia, hyponatremia, nausea. Hospitalist consulted 09/07/23. 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  Pt stated Hospitalist came to see her and did some med changes, ordered labs and now has a foley for retention. Pt was back in bed.  Performed bed level TKR TE's followed by ICE.    If plan is discharge home, recommend the following: A little help with walking and/or transfers;A little help with bathing/dressing/bathroom;Assistance with cooking/housework;Assist for transportation;Help with stairs or ramp for entrance   Can travel by private vehicle        Equipment Recommendations  None recommended by PT    Recommendations for Other Services       Precautions / Restrictions Precautions Precautions: Knee;Fall Precaution Comments: no pillow under knee Restrictions Weight Bearing Restrictions: No RLE Weight Bearing: Weight bearing as tolerated        Balance                                            Cognition Arousal: Alert Behavior During Therapy: WFL for tasks assessed/performed Overall Cognitive Status: Within Functional Limits for tasks assessed                                 General Comments: AxO x 3 pleasant Retired Geologist, 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  Pt stated Hospitalist came to see her and did some med changes, ordered labs and now has a foley for retention.        Exercises  Total Knee Replacement TE's following HEP handout 10 reps B LE ankle pumps 10  reps towel squeezes 10 reps knee presses 10 reps heel slides  10 reps SAQ's 10 reps SLR's 10 reps ABD Educated on use of gait belt to assist with TE's Followed by ICE     General Comments        Pertinent Vitals/Pain Pain Assessment Pain Assessment: Faces Pain Score: 3  Faces Pain Scale: Hurts a little bit Pain Location: R knee Pain Descriptors / Indicators: Discomfort, Guarding Pain Intervention(s): Monitored during session, Repositioned, Ice applied    Home Living                          Prior Function            PT Goals (current goals can now be found in the care plan section) Progress towards PT goals: Progressing toward goals    Frequency    7X/week      PT Plan      Co-evaluation              AM-PAC PT "6 Clicks" Mobility   Outcome Measure  Help needed turning from your back to your side while in a flat bed without using bedrails?: A Little Help needed moving from lying on your back  to sitting on the side of a flat bed without using bedrails?: A Little Help needed moving to and from a bed to a chair (including a wheelchair)?: A Little Help needed standing up from a chair using your arms (e.g., wheelchair or bedside chair)?: A Little Help needed to walk in hospital room?: A Little Help needed climbing 3-5 steps with a railing? : A Little 6 Click Score: 18    End of Session Equipment Utilized During Treatment: Gait belt Activity Tolerance: Patient limited by fatigue Patient left: in bed;with call bell/phone within reach;with bed alarm set Nurse Communication: Mobility status PT Visit Diagnosis: Unsteadiness on feet (R26.81);Other abnormalities of gait and mobility (R26.89);Muscle weakness (generalized) (M62.81);Difficulty in walking, not elsewhere classified (R26.2);Pain Pain - Right/Left: Right Pain - part of body: Knee     Time: 1600-1620 PT Time Calculation (min) (ACUTE ONLY): 20 min  Charges:    $Gait Training: 8-22  mins $Therapeutic Exercise: 8-22 mins $Therapeutic Activity: 8-22 mins PT General Charges $$ ACUTE PT VISIT: 1 Visit                     {Heather Hoover  PTA Acute  Rehabilitation Services Office M-F          [phone]

## 2023-09-09 NOTE — Progress Notes (Signed)
PROGRESS NOTE  Heather Hoover  DOB: 07/07/1946  PCP: Donita Brooks, MD DGU:[id number]  DOA: 09/01/2023  LOS: 4 days  Hospital Day: 9  Brief narrative: Heather Hoover is a 76 y.o. female with PMH significant for HTN, HLD, osteoarthritis 9/11, underwent right total knee replacement with Dr. Linna Caprice without complication.  Postop, she recovered well and started working with physical therapy.  But she developed some nausea, dizziness with ambulation and was noted to have orthostatic hypotension.  Labs also showed gradual drop in sodium level to as low as 122.  Her symptoms did not improve and has hospital service was consulted on 9/17.  Subjective: Patient was seen and examined this afternoon. Pleasant elderly Caucasian female.  Propped up in bed.  Not in distress.  Son and husband at bedside. Was noted to have more than 1 L of urine retention this afternoon just prior to my evaluation. Chart reviewed Blood pressure at rest seems to be normal range.  However on working with physical therapy today, patient got dizzy and had a near syncopal episode on ambulating about 10 feet.  Blood pressure prior to PT evaluation was 128/74.  Blood pressure after symptoms was 89/46.  It improved when she was put back on the recliner. Labs from this morning with WBC count 7.2, hemoglobin 7.8, sodium 127  Assessment and plan: Orthostatic hypotension H/o hypertension Postop, patient is having orthostatic hypotension.  Probably because of poor intake and continuation of antihypertensives.   PTA meds- carvedilol 6.25 mg twice daily, valsartan 325 mg daily I notice that she has been receiving Coreg.  I would hold it.  I would continue to hold valsartan. I will start normal saline at 100 mL/h. Minimize use of narcotics. Patient does not have any prior neurological history of neuropathy.  I would obtain vitamin B12, folate and TSH level. Last echo from a year ago showed EF of 60 to 65%, no regional  WMA. Continue to monitor blood pressure and orthostatics.  Hyponatremia It seems she has normal baseline sodium level. Sodium trend as below showing acute steady drop to the lowest of 121.  Serum osmolality was low at 264.  Obtain urine osmolality, urine sodium level Gradually improving sodium level.  127 today.  Expect improvement with further hydration. Recent Labs  Lab 09/03/23 0308 09/06/23 0818 09/07/23 0322 09/07/23 0849 09/07/23 1455 09/07/23 2058 09/08/23 0305 09/09/23 0716  NA 129* 124* 122* 124* 123* 121* 123* 127*   Acute urinary retention Chronic urinary incontinence Patient states she has chronic urinary urgency incontinence but manageable with pads. This afternoon, she was noted to have distended abdomen and subsequently drained 1.1 L of urine. Not constipated.  On Norco as needed and using about 2 times a day. I would avoid opioids  S/p Right TKR Osteoarthritis Followed by orthopedics. For pain management, I would keep her on scheduled Tylenol for now and avoid opioids  Hyperlipidemia Continue Crestor   Mobility: PT following but patient unable to ambulate because of orthostatic blood pressure drop  Goals of care   Code Status: Full Code     DVT prophylaxis:  SCDs Start: 09/01/23 1258 Place TED hose Start: 09/01/23 1258   Antimicrobials: None Fluid: NS at 100 mL/h Family Communication: Husband and son at bedside  Status: Inpatient Level of care:  Med-Surg   Patient is from: Home Needs to continue in-hospital care: Remains orthostatic Anticipated d/c to: Home if able to tolerate PT    Diet:  Diet Order  Diet regular Room service appropriate? Yes; Fluid consistency: Thin  Diet effective now           Diet - low sodium heart healthy                   Scheduled Meds:  acetaminophen  1,000 mg Oral TID   aspirin  81 mg Oral BID   pantoprazole  40 mg Oral Daily   rosuvastatin  20 mg Oral QHS    PRN meds: alum & mag  hydroxide-simeth, bismuth subsalicylate, diphenhydrAMINE, menthol-cetylpyridinium **OR** phenol, metoCLOPramide **OR** metoCLOPramide (REGLAN) injection, ondansetron **OR** ondansetron (ZOFRAN) IV, polyvinyl alcohol   Infusions:   sodium chloride Stopped (09/08/23 1225)   sodium chloride 100 mL/hr at 09/09/23 1305    Antimicrobials: Anti-infectives (From admission, onward)    Start     Dose/Rate Route Frequency Ordered Stop   09/01/23 1400  ceFAZolin (ANCEF) IVPB 2g/100 mL premix        2 g 200 mL/hr over 30 Minutes Intravenous Every 6 hours 09/01/23 1257 09/01/23 2129   09/01/23 0645  ceFAZolin (ANCEF) IVPB 2g/100 mL premix        2 g 200 mL/hr over 30 Minutes Intravenous On call to O.R. 09/01/23 0633 09/01/23 0905       Objective: Vitals:   09/09/23 1457 09/09/23 1458  BP: (!) 89/72 134/73  Pulse: (!) 102 99  Resp: 20   Temp: 98.3 F (36.8 C)   SpO2: 98% 90%    Intake/Output Summary (Last 24 hours) at 09/09/2023 1532 Last data filed at 09/09/2023 1440 Gross per 24 hour  Intake 1402.13 ml  Output 1650 ml  Net -247.87 ml   Filed Weights   09/01/23 0704 09/01/23 1257  Weight: 82.6 kg 82.6 kg   Weight change:  Body mass index is 31.26 kg/m.   Physical Exam: General exam: Pleasant elderly Caucasian female.  Not in pain Skin: No rashes, lesions or ulcers. 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  F/u labs ordered Unresulted Labs (From admission, onward)     Start     Ordered   09/10/23 0500  TSH  Tomorrow morning,   R        09/09/23 1517    09/10/23 0500  Vitamin B12  Tomorrow morning,   R        09/09/23 1517   09/10/23 0500  Folate  Tomorrow morning,   R        09/09/23 1517   09/10/23 0500  Basic metabolic panel  Tomorrow morning,   R        09/09/23 1532   09/10/23 0500  CBC with Differential/Platelet  Tomorrow morning,   R        09/09/23 1532   09/09/23 1345  Protein Electro, Random Urine  Once,   R        09/09/23 1344   09/09/23 1345  Sodium, urine, random  Once,   R        09/09/23 1344   09/09/23 1344  Osmolality, urine  Once,   R        09/09/23 1343   09/07/23 0841  Sodium, urine,  random  Once,   R        09/07/23 0840   09/07/23 0840  Osmolality, urine  Once,   R        09/07/23 0840            Total time spent in review of labs and imaging, patient evaluation, formulation of plan, documentation and communication with family: 55 minutes  Signed, Lorin Glass, MD [facility] 09/09/2023

## 2023-09-10 DIAGNOSIS — I951 Orthostatic hypotension: Secondary | ICD-10-CM | POA: Diagnosis not present

## 2023-09-10 LAB — CBC WITH DIFFERENTIAL/PLATELET
Abs Immature Granulocytes: 0.23 10*3/uL — ABNORMAL HIGH (ref 0.00–0.07)
Basophils Absolute: 0 10*3/uL (ref 0.0–0.1)
Basophils Relative: 1 %
Eosinophils Absolute: 0.1 10*3/uL (ref 0.0–0.5)
Eosinophils Relative: 2 %
HCT: 20.5 % — ABNORMAL LOW (ref 36.0–46.0)
Hemoglobin: 7.1 g/dL — ABNORMAL LOW (ref 12.0–15.0)
Immature Granulocytes: 3 %
Lymphocytes Relative: 15 %
Lymphs Abs: 1 10*3/uL (ref 0.7–4.0)
MCH: 31.8 pg (ref 26.0–34.0)
MCHC: 34.6 g/dL (ref 30.0–36.0)
MCV: 91.9 fL (ref 80.0–100.0)
Monocytes Absolute: 0.6 10*3/uL (ref 0.1–1.0)
Monocytes Relative: 9 %
Neutro Abs: 5 10*3/uL (ref 1.7–7.7)
Neutrophils Relative %: 70 %
Platelets: UNDETERMINED 10*3/uL (ref 150–400)
RBC: 2.23 MIL/uL — ABNORMAL LOW (ref 3.87–5.11)
RDW: 12.8 % (ref 11.5–15.5)
WBC: 7 10*3/uL (ref 4.0–10.5)
nRBC: 0 % (ref 0.0–0.2)

## 2023-09-10 LAB — PREPARE RBC (CROSSMATCH)

## 2023-09-10 LAB — TSH: TSH: 2.768 u[IU]/mL (ref 0.350–4.500)

## 2023-09-10 LAB — BASIC METABOLIC PANEL
Anion gap: 9 (ref 5–15)
BUN: 10 mg/dL (ref 8–23)
CO2: 21 mmol/L — ABNORMAL LOW (ref 22–32)
Calcium: 7.6 mg/dL — ABNORMAL LOW (ref 8.9–10.3)
Chloride: 97 mmol/L — ABNORMAL LOW (ref 98–111)
Creatinine, Ser: 0.34 mg/dL — ABNORMAL LOW (ref 0.44–1.00)
GFR, Estimated: >60 mL/min (ref >60)
Glucose, Bld: 95 mg/dL (ref 70–99)
Potassium: 4.3 mmol/L (ref 3.5–5.1)
Sodium: 127 mmol/L — ABNORMAL LOW (ref 135–145)

## 2023-09-10 LAB — RETICULOCYTES
Immature Retic Fract: 25.9 % — ABNORMAL HIGH (ref 2.3–15.9)
RBC.: 2.41 MIL/uL — ABNORMAL LOW (ref 3.87–5.11)
Retic Count, Absolute: 134.2 10*3/uL (ref 19.0–186.0)
Retic Ct Pct: 5.6 % — ABNORMAL HIGH (ref 0.4–3.1)

## 2023-09-10 LAB — IRON AND TIBC
Iron: 41 ug/dL (ref 28–170)
Saturation Ratios: 18 % (ref 10.4–31.8)
TIBC: 224 ug/dL — ABNORMAL LOW (ref 250–450)
UIBC: 183 ug/dL

## 2023-09-10 LAB — OCCULT BLOOD X 1 CARD TO LAB, STOOL: Fecal Occult Bld: POSITIVE — AB

## 2023-09-10 LAB — ABO/RH: ABO/RH(D): O POS

## 2023-09-10 LAB — VITAMIN B12: Vitamin B-12: 297 pg/mL (ref 180–914)

## 2023-09-10 LAB — FERRITIN: Ferritin: 142 ng/mL (ref 11–307)

## 2023-09-10 LAB — FOLATE: Folate: 11.6 ng/mL (ref >5.9)

## 2023-09-10 MED ORDER — SODIUM CHLORIDE 1 G PO TABS
1.0000 g | ORAL_TABLET | Freq: Three times a day (TID) | ORAL | Status: DC
  Administered 2023-09-10 – 2023-09-12 (×6): 1 g via ORAL
  Filled 2023-09-10 (×7): qty 1

## 2023-09-10 MED ORDER — ENOXAPARIN SODIUM 40 MG/0.4ML IJ SOSY: 40.0000 mg | PREFILLED_SYRINGE | INTRAMUSCULAR | Status: DC

## 2023-09-10 MED ORDER — KETOROLAC TROMETHAMINE 15 MG/ML IJ SOLN
15.0000 mg | Freq: Once | INTRAMUSCULAR | Status: AC
  Administered 2023-09-10: 15 mg via INTRAVENOUS
  Filled 2023-09-10: qty 1

## 2023-09-10 MED ORDER — SODIUM CHLORIDE 0.9% IV SOLUTION: Freq: Once | INTRAVENOUS | Status: AC

## 2023-09-10 NOTE — Progress Notes (Signed)
Physical Therapy Treatment Patient Details Name: Heather Hoover MRN: [id number] DOB: [birth year] Today's Date: 09/10/2023   History of Present Illness 77 yo female presents to therapy s/p R TKA on 09/01/2023 pt with post op anemia, hyponatremia, nausea. Hospitalist consulted 09/07/23. 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    PT Comments  POD # 9 extended stay due to post op orthostasis, urine retention and drop in her HgB. Am session performed bed level TKR TE's only.  Pt is doing well with her knee.  Pain is controlled and ROM approx 0 - 70 degrees. Will attempt OOB activity after blood transfusion.    If plan is discharge home, recommend the following: A little help with walking and/or transfers;A little help with bathing/dressing/bathroom;Assistance with cooking/housework;Assist for transportation;Help with stairs or ramp for entrance   Can travel by private vehicle        Equipment Recommendations  None recommended by PT    Recommendations for Other Services       Precautions / Restrictions Precautions Precautions: Knee;Fall Precaution Comments: no pillow under knee Restrictions Weight Bearing Restrictions: No RLE Weight Bearing: Weight bearing as tolerated           Cognition Arousal: Alert Behavior During Therapy: WFL for tasks assessed/performed Overall Cognitive Status: Within Functional Limits for tasks assessed                                 General Comments: AxO x 3 pleasant Retired Geologist, 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  HgB decreased to 7.1 and scheduled to receive one unit.        Exercises  Total Knee Replacement TE's following HEP handout 10 reps B LE ankle pumps 05 reps towel squeezes 05 reps knee presses 05 reps heel slides  05 reps SAQ's 05 reps SLR's 05 reps ABD Educated on use of gait belt to  assist with TE's Followed by ICE     General Comments        Pertinent Vitals/Pain Pain Assessment Pain Assessment: Faces Faces Pain Scale: Hurts a little bit Pain Location: R knee Pain Descriptors / Indicators: Aching Pain Intervention(s): Monitored during session, Repositioned    Home Living                          Prior Function            PT Goals (current goals can now be found in the care plan section) Progress towards PT goals: Progressing toward goals    Frequency    7X/week      PT Plan      Co-evaluation              AM-PAC PT "6 Clicks" Mobility   Outcome Measure  Help needed turning from your back to your side while in a flat bed without using bedrails?: A Little Help needed moving from lying on your back to sitting on the side of a flat bed without using bedrails?: A Little Help needed moving to and from a bed to a chair (including a wheelchair)?: A Little Help needed standing up from a chair using your arms (e.g., wheelchair or bedside chair)?: A Little Help needed to walk in hospital room?: A Little Help needed climbing 3-5 steps  with a railing? : A Little 6 Click Score: 18    End of Session Equipment Utilized During Treatment: Gait belt Activity Tolerance: Patient limited by fatigue Patient left: in bed;with call bell/phone within reach;with bed alarm set Nurse Communication: Mobility status PT Visit Diagnosis: Unsteadiness on feet (R26.81);Other abnormalities of gait and mobility (R26.89);Muscle weakness (generalized) (M62.81);Difficulty in walking, not elsewhere classified (R26.2);Pain Pain - Right/Left: Right Pain - part of body: Knee     Time: 1050-1105 PT Time Calculation (min) (ACUTE ONLY): 15 min  Charges:    $Therapeutic Exercise: 8-22 mins PT General Charges $$ ACUTE PT VISIT: 1 Visit                     Felecia Shelling  PTA Acute  Rehabilitation Services Office M-F          [phone]

## 2023-09-10 NOTE — Progress Notes (Signed)
Physical Therapy Treatment Patient Details Name: Heather Hoover MRN: [id number] DOB: 1946-04-21 Today's Date: 09/10/2023   History of Present Illness 77 yo female presents to therapy s/p R TKA on 09/01/2023 pt with post op anemia, hyponatremia, nausea. Hospitalist consulted 09/07/23. 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  Completed one unit bood.  Feeling "better"  ate a [facility].  Assisted OOB while taking Orthostatic vitals: Supine                   BP 157/76(101)  HR 105 irregular  RA 99% EOB                       BP 166/95(110)  HR 109 irregular  RA 99% Standing                 BP 163/90(105)  HR 106 irregular  RA 99% 3 min amb 55 feet   BP 170/89(110)  HR 111 irregular  RA 99% Pt feeling "better" NO dizziness  asymptomatic  amb another 55 feet back to her room.  Pain 4/10 well controlled.  Pt left in recliner and applied ICE to knee.     If plan is discharge home, recommend the following: A little help with walking and/or transfers;A little help with bathing/dressing/bathroom;Assistance with cooking/housework;Assist for transportation;Help with stairs or ramp for entrance   Can travel by private vehicle        Equipment Recommendations  None recommended by PT    Recommendations for Other Services       Precautions / Restrictions       Mobility  Bed Mobility Overal bed mobility: Needs Assistance       Supine to sit: Supervision     General bed mobility comments: pt was able to self guide R LE off bed without need for strap.  progress.    Transfers Overall transfer level: Needs assistance Equipment used: Rolling walker (2 wheels) Transfers: Sit to/from Stand Sit to Stand: Supervision            General transfer comment: pt self able to rise from EOB to standing with good use of hands to steady self.  progress    Ambulation/Gait Ambulation/Gait assistance: Supervision, Contact guard assist Gait Distance (Feet): 110 Feet (55 feet x 2) Assistive device: Rolling walker (2 wheels) Gait Pattern/deviations: Step-to pattern, Decreased step length - right, Decreased step length - left, Shuffle, Trunk flexed Gait velocity: decreased     General Gait Details: pt tolerated a functional distance of 55 feet twice with NO c/o dizziness.  No nausea.  Slow but steady.  Equal weight shift.  Alternating gait.  MUCH improved after blood transfusion and Medical medication change.  Noted hypertensive of 170/89(110) with 3 min walk plus irregular HR 111.  Pt asymptomatic. "Just a little tired", stated pt.   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  Completed one unit bood.  Feeling "better"        Exercises      General Comments        Pertinent Vitals/Pain Pain Assessment Pain Assessment: 0-10 Pain Score: 4  Faces Pain Scale: Hurts a little bit Pain Location: R knee Pain Descriptors / Indicators: Aching Pain Intervention(s): Monitored during session, Premedicated before session, Repositioned, Ice applied    Home Living                          Prior Function            PT Goals (current goals can now be found in the care plan section) Progress towards PT goals: Progressing toward goals    Frequency    7X/week      PT Plan       Co-evaluation              AM-PAC PT "6 Clicks" Mobility   Outcome Measure  Help needed turning from your back to your side while in a flat bed without using bedrails?: None Help needed moving from lying on your back to sitting on the side of a flat bed without using bedrails?: None Help needed moving to and from a bed to a chair (including a wheelchair)?: None Help needed standing up from a chair using your arms (e.g., wheelchair or bedside chair)?: None Help needed to walk in hospital room?: A Little Help needed climbing 3-5 steps with a railing? : A Little 6 Click Score: 22    End of Session Equipment Utilized During Treatment: Gait belt Activity Tolerance: Patient limited by fatigue;Patient tolerated treatment well Patient left: in chair;with call bell/phone within reach;with chair alarm set;with nursing/sitter in room Nurse Communication: Mobility status PT Visit Diagnosis: Unsteadiness on feet (R26.81);Other abnormalities of gait and mobility (R26.89);Muscle weakness (generalized) (M62.81);Difficulty in walking, not elsewhere classified (R26.2);Pain Pain - Right/Left: Right Pain - part of body: Knee     Time: 5409-8119 PT Time Calculation (min) (ACUTE ONLY): 32 min  Charges:    $Gait Training: 8-22 mins $Therapeutic Activity: 8-22 mins PT General Charges $$ ACUTE PT VISIT: 1 Visit                     {Brynleigh Sequeira  PTA Acute  Rehabilitation Services Office M-F          [phone]

## 2023-09-10 NOTE — Plan of Care (Signed)
Problem: Clinical Measurements: Goal: Ability to maintain clinical measurements within normal limits will improve Outcome: Progressing Goal: Will remain free from infection Outcome: Progressing Goal: Diagnostic test results will improve Outcome: Progressing   Problem: Pain Managment: Goal: General experience of comfort will improve Outcome: Progressing   Problem: Safety: Goal: Ability to remain free from injury will improve Outcome: Progressing

## 2023-09-10 NOTE — Progress Notes (Signed)
    Subjective:  Patient reports pain as mild.  Denies N/V/CP/SOB/Abd pain. She reports requiring in and out cath x2 yesterday due to urinary retention. She states she does not feel the urge to need to urinate.   She states he knee is doing great. She is eager for discharge home. We discussed barriers to discharge.   Objective:   VITALS:   Vitals:   09/09/23 1457 09/09/23 1458 09/09/23 2158 09/10/23 0541  BP: (!) 89/72 134/73 (!) 148/79 (!) 148/83  Pulse: (!) 102 99 99 93  Resp: 20  16 17   Temp: 98.3 F (36.8 C)  98.4 F (36.9 C) 98 F (36.7 C)  TempSrc:   Oral Oral  SpO2: 98% 90% 98% 98%  Weight:      Height:        Patient is sitting up in bed. NAD.  Neurologically intact ABD soft Neurovascular intact Sensation intact distally Intact pulses distally Dorsiflexion/Plantar flexion intact Incision: dressing C/D/I No cellulitis present Compartment soft Doing well with knee ROM.   Lab Results  Component Value Date   WBC 7.0 09/10/2023   HGB 7.1 (L) 09/10/2023   HCT 20.5 (L) 09/10/2023   MCV 91.9 09/10/2023   PLT PLATELET CLUMPS NOTED ON SMEAR, UNABLE TO ESTIMATE 09/10/2023   BMET    Component Value Date/Time   NA 127 (L) 09/10/2023 0336   NA 137 10/23/2022 1017   K 4.3 09/10/2023 0336   CL 97 (L) 09/10/2023 0336   CO2 21 (L) 09/10/2023 0336   GLUCOSE 95 09/10/2023 0336   BUN 10 09/10/2023 0336   BUN 16 10/23/2022 1017   CREATININE 0.34 (L) 09/10/2023 0336   CREATININE 0.62 07/20/2023 0816   CALCIUM 7.6 (L) 09/10/2023 0336   EGFR 95 07/08/2023 1249   EGFR 92 10/23/2022 1017   GFRNONAA >60 09/10/2023 0336   GFRNONAA 89 04/13/2017 0820     Assessment/Plan: 9 Days Post-Op   Principal Problem:   Degenerative arthritis of right knee Active Problems:   S/P total knee arthroplasty, right  ABLA. Hemoglobin 7.1 (7.8 yesterday). Continue to monitor.   WBAT with walker DVT ppx: Aspirin, SCDs, TEDS PO pain control PT/OT: Slow progression with ambulation  with PT due to orthostatic hypotension. She is doing well with exercises and ROM.  Dispo:  - Hospitalist following, appreciate their recommendations.  - Continue to work with PT.  - D/c pending PT clearance.    Alain Honey [facility] 09/10/2023, 8:48 AM   [facility]  [facility] [address], [place], [place] [address] Phone: [phone] [email or web address] Facebook  [employer]

## 2023-09-10 NOTE — TOC Progression Note (Signed)
Transition of Care [facility]) - Progression Note   Patient Details  Name: Heather Hoover MRN: [id number] Date of Birth: May 31, 1946  Transition of [facility]) CM/SW Contact  Ewing Schlein, LCSW Phone Number: 09/10/2023, 1:06 PM  Clinical Narrative: Patient requested private duty resources. CSW provided patient with private duty care list.  Barriers to Discharge: No Barriers Identified  Expected Discharge Plan and Services Expected Discharge Date: 09/03/23               DME Arranged: N/A DME Agency: NA  Social Determinants of Health (SDOH) Interventions SDOH Screenings   Food Insecurity: No Food Insecurity (09/01/2023)  Housing: Low Risk  (09/01/2023)  Transportation Needs: No Transportation Needs (09/01/2023)  Utilities: Not At Risk (09/01/2023)  Alcohol Screen: Low Risk  (07/18/2023)  Depression (PHQ2-9): Low Risk  (07/22/2023)  Financial Resource Strain: Low Risk  (07/18/2023)  Physical Activity: Insufficiently Active (07/18/2023)  Social Connections: Moderately Integrated (07/18/2023)  Stress: No Stress Concern Present (07/18/2023)  Tobacco Use: Low Risk  (09/01/2023)  Health Literacy: Adequate Health Literacy (07/22/2023)   Readmission Risk Interventions     No data to display

## 2023-09-10 NOTE — Progress Notes (Signed)
PROGRESS NOTE  [name]  DOB: 02-24-1946  PCP: Donita Brooks, MD NGE:[id number]  DOA: 09/01/2023  LOS: 5 days  Hospital Day: 10  Brief narrative: Heather Hoover is a 77 y.o. female with PMH significant for HTN, HLD, osteoarthritis 9/11, underwent right total knee replacement with Dr. Linna Caprice without complication.  Postop, she recovered well and started working with physical therapy.  But she developed some nausea, dizziness with ambulation and was noted to have orthostatic hypotension.  Labs also showed gradual drop in sodium level to as low as 122.  Her symptoms did not improve and has hospital service was consulted on 9/17.  Subjective: Patient was seen and examined this morning. Pleasant elderly Caucasian female.  Propped up in bed. Husband at bedside. No pain. Nurse tech notified that patient had fresh blood in stool.  Patient states she has hemorrhoids with intermittent bleeding history. Retaining urine.  Required in and out 3 times last 24 hours Labs from this morning with hemoglobin low at 7.1, sodium low but stable at 127.  Assessment and plan: Orthostatic hypotension H/o hypertension Postop, patient is having orthostatic hypotension.  Probably because of poor intake and continuation of antihypertensives.   PTA meds- carvedilol 6.25 mg twice daily, valsartan 325 mg daily both are currently on hold.  She has had adequate hydration.  We have stopped narcotics. Vitamin B12, folic acid level normal. Based on her drop in hemoglobin, I think anemia is the cause of orthostatic hypotension. Last echo from a year ago showed EF of 60 to 65%, no regional WMA. Continue to monitor blood pressure and orthostatics.  Acute blood loss anemia H/o hemorrhoids Baseline hemoglobin of 11.  Since surgery, hemoglobin has been downtrending.  Down to 7.1 today.  Transfuse 1 unit of PRBC. Reports bright red blood in stool today, likely from hemorrhoids. Ferritin level is not  low so I have low suspicion of chronic indolent bleeding source. Last colonoscopy was with Dr. Loreta Ave in 2021. Patient is on aspirin 81 mg twice daily as part of the DVT prophylaxis.  I will stop that.  Continue Protonix. Recent Labs    09/06/23 0818 09/07/23 0322 09/08/23 0848 09/09/23 0716 09/10/23 0335 09/10/23 0336 09/10/23 1019  HGB 8.2* 7.8* 7.8* 7.8*  --  7.1*  --   MCV 96.5 95.5 96.8 97.2  --  91.9  --   VITAMINB12  --   --   --   --   --  297  --   FOLATE  --   --   --   --   --  11.6  --   FERRITIN  --   --   --   --  142  --   --   TIBC  --   --   --   --  224*  --   --   IRON  --   --   --   --  41  --   --   RETICCTPCT  --   --   --   --   --   --  5.6*   Hyponatremia It seems she has normal baseline sodium level. Sodium trend as below showing acute steady drop to the lowest of 121.  Serum osmolality was low at 264.  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  Continue to  monitor Recent Labs  Lab 09/06/23 0818 09/07/23 0322 09/07/23 0849 09/07/23 1455 09/07/23 2058 09/08/23 0305 09/09/23 0716 09/10/23 0336  NA 124* 122* 124* 123* 121* 123* 127* 127*   Acute urinary retention Chronic urinary incontinence Patient states she has chronic urinary urgency incontinence but manageable with pads. 9/19, she was noted to have distended abdomen and subsequently drained 1.1 L of urine.  Overnight, she required another and out catheterizing. Insert Foley catheter. I would avoid opioids  S/p Right TKR Osteoarthritis Followed by orthopedics. For pain management, I would keep her on scheduled Tylenol for now and avoid opioids For DVT prophylaxis, patient was getting aspirin 81 mg twice daily.  With possible GI bleeding, I would hold it for now.  Continue SCDs  Hyperlipidemia Continue Crestor   Mobility: Mobility is impaired.  PT is  following but patient unable to ambulate because of orthostatic blood pressure drop  Goals of care   Code Status: Full Code     DVT prophylaxis:  enoxaparin (LOVENOX) injection 40 mg Start: 09/11/23 1200 SCDs Start: 09/01/23 1258 Place TED hose Start: 09/01/23 1258   Antimicrobials: None Fluid: Stop IV fluid today Family Communication: Husband at bedside  Status: Inpatient Level of care:  Med-Surg   Patient is from: Home Needs to continue in-hospital care: Remains orthostatic, anemic Anticipated d/c to: Home if able to tolerate PT    Diet:  Diet Order             Diet regular Room service appropriate? Yes; Fluid consistency: Thin  Diet effective now           Diet - low sodium heart healthy                   Scheduled Meds:  acetaminophen  1,000 mg Oral TID   [START ON 09/11/2023] enoxaparin (LOVENOX) injection  40 mg Subcutaneous Q24H   pantoprazole  40 mg Oral Daily   rosuvastatin  20 mg Oral QHS   sodium chloride  1 g Oral TID WC    PRN meds: alum & mag hydroxide-simeth, bismuth subsalicylate, diphenhydrAMINE, menthol-cetylpyridinium **OR** phenol, metoCLOPramide **OR** metoCLOPramide (REGLAN) injection, ondansetron **OR** ondansetron (ZOFRAN) IV, polyvinyl alcohol   Infusions:     Antimicrobials: Anti-infectives (From admission, onward)    Start     Dose/Rate Route Frequency Ordered Stop   09/01/23 1400  ceFAZolin (ANCEF) IVPB 2g/100 mL premix        2 g 200 mL/hr over 30 Minutes Intravenous Every 6 hours 09/01/23 1257 09/01/23 2129   09/01/23 0645  ceFAZolin (ANCEF) IVPB 2g/100 mL premix        2 g 200 mL/hr over 30 Minutes Intravenous On call to O.R. 09/01/23 0633 09/01/23 0905       Objective: Vitals:   09/10/23 0541 09/10/23 1257  BP: (!) 148/83 139/73  Pulse: 93 (!) 102  Resp: 17 16  Temp: 98 F (36.7 C) 98.1 F (36.7 C)  SpO2: 98% 98%    Intake/Output Summary (Last 24 hours) at 09/10/2023 1313 Last data filed at 09/10/2023  1000 Gross per 24 hour  Intake 2859.64 ml  Output 3350 ml  Net -490.36 ml   Filed Weights   09/01/23 0704 09/01/23 1257  Weight: 82.6 kg 82.6 kg   Weight change:  Body mass index is 31.26 kg/m.   Physical Exam: General exam: Pleasant elderly Caucasian female.  Not in pain.  Looks puffy overall. Skin: No rashes, lesions or ulcers. 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  F/u labs ordered Unresulted Labs (From admission, onward)     Start     Ordered   09/10/23 1247  Occult blood card to lab, stool  Once,   R        09/10/23 1246   09/09/23 1345  Protein Electro, Random Urine  Once,   R        09/09/23 1344   09/07/23 0841  Sodium, urine, random  Once,   R        09/07/23 0840   09/07/23 0840  Osmolality, urine  Once,   R        09/07/23 0840            Total time spent in review of labs and imaging, patient evaluation, formulation of plan, documentation and communication with family: 55 minutes  Signed, Lorin Glass, MD [facility] 09/10/2023

## 2023-09-10 NOTE — Plan of Care (Signed)
Problem: Education: Goal: Knowledge of the prescribed therapeutic regimen will improve Outcome: Progressing   Problem: Activity: Goal: Risk for activity intolerance will decrease Outcome: Progressing   Problem: Pain Managment: Goal: General experience of comfort will improve Outcome: Progressing   Problem: Safety: Goal: Ability to remain free from injury will improve Outcome: Progressing

## 2023-09-11 DIAGNOSIS — M1711 Unilateral primary osteoarthritis, right knee: Secondary | ICD-10-CM | POA: Diagnosis not present

## 2023-09-11 LAB — CBC WITH DIFFERENTIAL/PLATELET
Abs Immature Granulocytes: 0.55 10*3/uL — ABNORMAL HIGH (ref 0.00–0.07)
Basophils Absolute: 0.1 10*3/uL (ref 0.0–0.1)
Basophils Relative: 1 %
Eosinophils Absolute: 0.2 10*3/uL (ref 0.0–0.5)
Eosinophils Relative: 2 %
HCT: 26.7 % — ABNORMAL LOW (ref 36.0–46.0)
Hemoglobin: 8.8 g/dL — ABNORMAL LOW (ref 12.0–15.0)
Immature Granulocytes: 6 %
Lymphocytes Relative: 14 %
Lymphs Abs: 1.2 10*3/uL (ref 0.7–4.0)
MCH: 30.9 pg (ref 26.0–34.0)
MCHC: 33 g/dL (ref 30.0–36.0)
MCV: 93.7 fL (ref 80.0–100.0)
Monocytes Absolute: 0.7 10*3/uL (ref 0.1–1.0)
Monocytes Relative: 8 %
Neutro Abs: 6.3 10*3/uL (ref 1.7–7.7)
Neutrophils Relative %: 69 %
Platelets: 336 10*3/uL (ref 150–400)
RBC: 2.85 MIL/uL — ABNORMAL LOW (ref 3.87–5.11)
RDW: 14.6 % (ref 11.5–15.5)
WBC: 8.9 10*3/uL (ref 4.0–10.5)
nRBC: 0 % (ref 0.0–0.2)

## 2023-09-11 LAB — BASIC METABOLIC PANEL
Anion gap: 7 (ref 5–15)
BUN: 10 mg/dL (ref 8–23)
CO2: 27 mmol/L (ref 22–32)
Calcium: 7.9 mg/dL — ABNORMAL LOW (ref 8.9–10.3)
Chloride: 98 mmol/L (ref 98–111)
Creatinine, Ser: 0.37 mg/dL — ABNORMAL LOW (ref 0.44–1.00)
GFR, Estimated: >60 mL/min (ref >60)
Glucose, Bld: 112 mg/dL — ABNORMAL HIGH (ref 70–99)
Potassium: 3.6 mmol/L (ref 3.5–5.1)
Sodium: 132 mmol/L — ABNORMAL LOW (ref 135–145)

## 2023-09-11 MED ORDER — ASPIRIN 81 MG PO CHEW
81.0000 mg | CHEWABLE_TABLET | Freq: Two times a day (BID) | ORAL | Status: DC
  Administered 2023-09-11 – 2023-09-13 (×5): 81 mg via ORAL
  Filled 2023-09-11 (×5): qty 1

## 2023-09-11 NOTE — Progress Notes (Signed)
PT TX NOTE  09/11/23 1500  PT Visit Information  Last PT Received On 09/11/23  Assistance Needed +1--chair follow for safety  Continued steady progress. 130' total distance with 2 seated rest breaks d/t fatigue/knee soreness, no dizziness;  BP 181/78 after amb Continue PT in acute setting  History of Present Illness 77 yo female presents to therapy s/p R TKA on 09/01/2023 pt with post op anemia, hyponatremia, nausea. Hospitalist consulted 09/07/23. 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  Precautions  Precautions Knee;Fall  Precaution Comments no pillow under knee  Restrictions  Weight Bearing Restrictions No  RLE Weight Bearing WBAT  Pain Assessment  Pain Assessment 0-10  Pain Score 5  Pain Location R knee  Pain Descriptors / Indicators Aching;Sore  Pain Intervention(s) Limited activity within patient's tolerance;Monitored during session;Premedicated before session;Repositioned;Ice applied;RN gave pain meds during session  Cognition  Arousal Alert  Behavior During Therapy WFL for tasks assessed/performed  Overall Cognitive Status Within Functional Limits for tasks assessed  Bed Mobility  Overal bed mobility Needs Assistance  Supine to sit Supervision  Sit to supine Supervision  General bed mobility comments no physical assist, able to progress RLE off bed without leg lifter  Transfers  Overall transfer level Needs assistance  Equipment used Rolling walker (2 wheels)  Transfers Sit to/from Stand  Sit to Stand Supervision  General transfer comment cues for hand placement  Ambulation/Gait  Ambulation/Gait assistance Supervision;Contact guard assist  Gait Distance (Feet) 70 Feet (60' more)  Assistive device Rolling walker (2 wheels)  Gait Pattern/deviations Step-through pattern;Decreased stride length;Decreased weight shift to right  General Gait  Details 130' total distance with 2 seated rest breaks d/t fatigu/knee soreness, no dizziness; BP 181/78 after amb  Total Joint Exercises  Ankle Circles/Pumps AROM;Both;10 reps  Quad Sets AROM;Both;10 reps;Supine  Short Arc Quad AROM;Strengthening;Right;10 reps  Heel Slides AAROM;Right;10 reps  Straight Leg Raises AROM;Strengthening;Right;10 reps  Hip ABduction/ADduction AROM;Right;10 reps  General Exercises - Lower Extremity  Heel Raises AROM;Both;5 reps;Standing  PT - End of Session  Equipment Utilized During Treatment Gait belt  Activity Tolerance Patient tolerated treatment well  Patient left in bed;with call bell/phone within reach;with bed alarm set;with nursing/sitter in room   PT - Assessment/Plan  PT Visit Diagnosis Unsteadiness on feet (R26.81);Other abnormalities of gait and mobility (R26.89);Muscle weakness (generalized) (M62.81);Difficulty in walking, not elsewhere classified (R26.2);Pain  Pain - Right/Left Right  Pain - part of body Knee  PT Frequency (ACUTE ONLY) 7X/week  Follow Up Recommendations Follow physician's recommendations for discharge plan and follow up therapies  Patient can return home with the following A little help with walking and/or transfers;A little help with bathing/dressing/bathroom;Assistance with cooking/housework;Assist for transportation;Help with stairs or ramp for entrance  PT equipment None recommended by PT;Other (comment) (?w/c)  AM-PAC PT "6 Clicks" Mobility Outcome Measure (Version 2)  Help needed turning from your back to your side while in a flat bed without using bedrails? 4  Help needed moving from lying on your back to sitting on the side of a flat bed without using bedrails? 4  Help needed moving to and from a bed to a chair (including a wheelchair)? 3  Help needed standing up from a chair using your arms (e.g., wheelchair or bedside chair)? 3  Help needed to walk in hospital room? 3  Help needed climbing 3-5 steps with a railing?  3   6 Click Score 20  Consider Recommendation of Discharge To: Home with no services  PT Goal Progression  Progress towards PT goals Progressing toward goals  Acute Rehab PT Goals  PT Goal Formulation With patient  Time For Goal Achievement 09/15/23  PT Time Calculation  PT Start Time (ACUTE ONLY) 1505  PT Stop Time (ACUTE ONLY) 1545  PT Time Calculation (min) (ACUTE ONLY) 40 min  PT General Charges  $$ ACUTE PT VISIT 1 Visit  PT Treatments  $Gait Training 23-37 mins  $Therapeutic Exercise 8-22 mins

## 2023-09-11 NOTE — Progress Notes (Signed)
Discussed foley care process with patient since patient may have to go home with foley catheter. Patient verbalized understanding. Also discussed with floor techs in regards to educating patient how to clean foley cath to prevent infection.

## 2023-09-11 NOTE — Progress Notes (Signed)
PROGRESS NOTE  Heather Hoover  DOB: [birth year]  PCP: Donita Brooks, MD ZOX:[id number]  DOA: 09/01/2023  LOS: 6 days  Hospital Day: 11  Brief narrative: Heather Hoover is a 77 y.o. female with PMH significant for HTN, HLD, osteoarthritis 9/11, underwent right total knee replacement with Dr. Linna Caprice without complication.  Postop, she recovered well and started working with physical therapy.  But she developed some nausea, dizziness with ambulation and was noted to have orthostatic hypotension.  Labs also showed gradual drop in sodium level to as low as 122.  Her symptoms did not improve and has hospital service was consulted on 9/17.  Subjective: Patient was seen and examined this morning. Pleasant elderly Caucasian female.  Propped up in bed. Husband at bedside.  Patient feels better.  She was able to walk yesterday without drop in blood pressure.  Felt tired though. He still continues to have mild intermittent bleeding with the hemorrhoids.   Hemoglobin improved with transfusion.  Sodium level improved to 132 today.  Assessment and plan: Orthostatic hypotension H/o hypertension Postop, patient's mobility was limited because of orthostatic hypotension which is probably due to anemia and continues on her blood pressure medicines. PTA meds- carvedilol 6.25 mg twice daily, valsartan 325 mg daily both are currently on hold.   She has been adequately hydrated.  Blood transfusion given. Ortho static vital signs improved. Blood pressure is consistently over 140s for the last 24 hours.  If remains elevated, will plan to resume valsartan tomorrow. Last echo from a year ago showed EF of 60 to 65%, no regional WMA.  Acute blood loss anemia Bleeding hemorrhoids Baseline hemoglobin of 11, patient had intermittently bleeding hemorrhoids for a long time.   With fracture and surgery, hemoglobin gradually dropped to the lowest of 7.1 on 9/20.  1 unit of PRBC transfusion was given.   Hemoglobin improved to 8.8 today. Intermittent hemorrhoidal bleeding continues. However ferritin level is normal ruling out any possible chronic bleeding source. Last colonoscopy was with Dr. Loreta Ave in 2021. I would resumeaspirin 81 mg twice daily as part of the DVT prophylaxis. Continue Protonix. Recent Labs    09/07/23 0322 09/08/23 0848 09/09/23 0716 09/10/23 0335 09/10/23 0336 09/10/23 1019 09/11/23 0345  HGB 7.8* 7.8* 7.8*  --  7.1*  --  8.8*  MCV 95.5 96.8 97.2  --  91.9  --  93.7  VITAMINB12  --   --   --   --  297  --   --   FOLATE  --   --   --   --  11.6  --   --   FERRITIN  --   --   --  142  --   --   --   TIBC  --   --   --  224*  --   --   --   IRON  --   --   --  41  --   --   --   RETICCTPCT  --   --   --   --   --  5.6*  --    Hyponatremia It seems she has normal baseline sodium level. Sodium trend as below.  She had an acute steady drop to the lowest of 121.  Serum osmolality was low at 264.  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 Sodium level gradually improved to 132 today.  I will  continue salt tabs for today.  Continue to monitor. Recent Labs  Lab 09/06/23 0818 09/07/23 0322 09/07/23 0849 09/07/23 1455 09/07/23 2058 09/08/23 0305 09/09/23 0716 09/10/23 0336 09/11/23 0345  NA 124* 122* 124* 123* 121* 123* 127* 127* 132*   Acute urinary retention Chronic urinary incontinence Patient states she has chronic urinary urgency incontinence but manageable with pads. 9/19, she was noted to have distended abdomen and subsequently drained 1.1 L of urine.  Overnight, she required another and out catheterizing. Foley catheter was inserted.  Needs urology follow-up as an outpatient. I would avoid opioids  S/p Right TKR Osteoarthritis Followed by orthopedics. For pain management, I would keep her on scheduled Tylenol for now and avoid opioids  Hyperlipidemia Continue  Crestor   Mobility: Mobility is impaired.  PT following.  Goals of care   Code Status: Full Code     DVT prophylaxis:  SCDs Start: 09/01/23 1258 Place TED hose Start: 09/01/23 1258   Antimicrobials: None Fluid: Not on IV fluid Family Communication: Husband at bedside  Status: Inpatient Level of care:  Med-Surg   Patient is from: Home Needs to continue in-hospital care: Improving orthostatics.  Improving anemia Anticipated d/c to: If able to tolerate ambulation today, and labs stable tomorrow, plan to discharge home tomorrow   Diet:  Diet Order             Diet regular Room service appropriate? Yes; Fluid consistency: Thin  Diet effective now           Diet - low sodium heart healthy                   Scheduled Meds:  acetaminophen  1,000 mg Oral TID   aspirin  81 mg Oral BID   pantoprazole  40 mg Oral Daily   rosuvastatin  20 mg Oral QHS   sodium chloride  1 g Oral TID WC    PRN meds: alum & mag hydroxide-simeth, bismuth subsalicylate, diphenhydrAMINE, menthol-cetylpyridinium **OR** phenol, metoCLOPramide **OR** metoCLOPramide (REGLAN) injection, ondansetron **OR** ondansetron (ZOFRAN) IV, polyvinyl alcohol   Infusions:     Antimicrobials: Anti-infectives (From admission, onward)    Start     Dose/Rate Route Frequency Ordered Stop   09/01/23 1400  ceFAZolin (ANCEF) IVPB 2g/100 mL premix        2 g 200 mL/hr over 30 Minutes Intravenous Every 6 hours 09/01/23 1257 09/01/23 2129   09/01/23 0645  ceFAZolin (ANCEF) IVPB 2g/100 mL premix        2 g 200 mL/hr over 30 Minutes Intravenous On call to O.R. 09/01/23 0633 09/01/23 0905       Objective: Vitals:   09/11/23 0529 09/11/23 0542  BP: (!) 169/98 (!) 167/92  Pulse: 98 92  Resp: 16 18  Temp: 98.2 F (36.8 C)   SpO2: 99% 100%    Intake/Output Summary (Last 24 hours) at 09/11/2023 1123 Last data filed at 09/11/2023 0950 Gross per 24 hour  Intake 1050 ml  Output 3050 ml  Net -2000 ml    Filed Weights   09/01/23 0704 09/01/23 1257  Weight: 82.6 kg 82.6 kg   Weight change:  Body mass index is 31.26 kg/m.   Physical Exam: General exam: Pleasant elderly Caucasian female.  Not in pain.  Not in pain Skin: No rashes, lesions or ulcers. 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  F/u labs ordered Unresulted Labs (From admission, onward)     Start     Ordered   09/11/23 0500  CBC with Differential/Platelet  Daily,   R      09/10/23 1747   09/11/23 0500  Basic metabolic panel  Daily,   R      09/10/23 1747   09/09/23 1345  Protein Electro, Random Urine  Once,   R        09/09/23 1344   09/07/23 0841  Sodium, urine, random  Once,   R        09/07/23 0840   09/07/23 0840  Osmolality, urine  Once,   R        09/07/23 0840            Total time spent in review of labs and imaging, patient evaluation, formulation of plan, documentation and communication with family: 45 minutes  Signed, Lorin Glass, MD [facility] 09/11/2023

## 2023-09-11 NOTE — Progress Notes (Signed)
Physical Therapy Treatment Patient Details Name: Heather Hoover MRN: [id number] DOB: [birth year] Today's Date: 09/11/2023   History of Present Illness 77 yo female presents to therapy s/p R TKA on 09/01/2023 pt with post op anemia, hyponatremia, nausea. Hospitalist consulted 09/07/23. 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    PT Comments  Pt is progressing this date however continues to have some dizziness requiring seated rests; BP supine   137/76  HR 95 BP sitting    153/92  HR  97 BP standing 95/82   HR  103 Pt was able to sit, recover and amb 40' x2 with RW and supervision and close chair follow.  Pt has 1 step x 2, to get into the house, once she has done these steps can stay in main living area, sleep in recliner, use BSC. We did discuss option of w/c if needed.  Husband will be with pt however he provides limited support per pt.  Continue PT POC   If plan is discharge home, recommend the following: A little help with walking and/or transfers;A little help with bathing/dressing/bathroom;Assistance with cooking/housework;Assist for transportation;Help with stairs or ramp for entrance   Can travel by private vehicle        Equipment Recommendations  None recommended by PT;Other (comment) (?w/c)    Recommendations for Other Services       Precautions / Restrictions Precautions Precautions: Knee;Fall Precaution Comments: no pillow under knee Restrictions Weight Bearing Restrictions: No RLE Weight Bearing: Weight bearing as tolerated     Mobility  Bed Mobility Overal bed mobility: Needs Assistance       Supine to sit: Supervision     General bed mobility comments: no physical assist, able to progress RLE off bed without let lifter    Transfers Overall transfer level: Needs assistance Equipment used: Rolling walker (2  wheels) Transfers: Sit to/from Stand Sit to Stand: Supervision           General transfer comment: cues for hand placement    Ambulation/Gait Ambulation/Gait assistance: Supervision, Contact guard assist Gait Distance (Feet): 40 Feet (x2) Assistive device: Rolling walker (2 wheels) Gait Pattern/deviations: Step-to pattern, Step-through pattern, Decreased stride length, Decreased weight shift to right       General Gait Details: pt able to amb 40' x2 however requring seated rest each time with c/o fatigue and slight "lightheadedness"; BP 123/93 after initial 40'. gait is improving with near equal wt shift and incr stride length   Stairs             Wheelchair Mobility     Tilt Bed    Modified Rankin (Stroke Patients Only)       Balance                                            Cognition Arousal: Alert Behavior During Therapy: WFL for tasks assessed/performed Overall Cognitive Status: Within Functional Limits for tasks assessed                                          Exercises Total Joint Exercises Ankle Circles/Pumps: AROM, Both, 10 reps General Exercises -  Lower Extremity Heel Raises: AROM, Both, 5 reps, Standing    General Comments        Pertinent Vitals/Pain Pain Assessment Pain Assessment: 0-10 Pain Score: 4  Pain Location: R knee Pain Descriptors / Indicators: Aching, Sore Pain Intervention(s): Limited activity within patient's tolerance, Monitored during session, Premedicated before session, Repositioned    Home Living                          Prior Function            PT Goals (current goals can now be found in the care plan section) Acute Rehab PT Goals PT Goal Formulation: With patient Time For Goal Achievement: 09/15/23 Progress towards PT goals: Progressing toward goals    Frequency    7X/week      PT Plan      Co-evaluation              AM-PAC PT "6 Clicks"  Mobility   Outcome Measure  Help needed turning from your back to your side while in a flat bed without using bedrails?: None Help needed moving from lying on your back to sitting on the side of a flat bed without using bedrails?: None Help needed moving to and from a bed to a chair (including a wheelchair)?: A Little Help needed standing up from a chair using your arms (e.g., wheelchair or bedside chair)?: A Little Help needed to walk in hospital room?: A Little Help needed climbing 3-5 steps with a railing? : A Little 6 Click Score: 20    End of Session Equipment Utilized During Treatment: Gait belt Activity Tolerance: Patient tolerated treatment well     PT Visit Diagnosis: Unsteadiness on feet (R26.81);Other abnormalities of gait and mobility (R26.89);Muscle weakness (generalized) (M62.81);Difficulty in walking, not elsewhere classified (R26.2);Pain Pain - Right/Left: Right Pain - part of body: Knee     Time: 1132-1201 PT Time Calculation (min) (ACUTE ONLY): 29 min  Charges:    $Gait Training: 23-37 mins PT General Charges $$ ACUTE PT VISIT: 1 Visit                     Susette Seminara, PT  Acute Rehab Dept [facility]) [phone]  09/11/2023    [facility] 09/11/2023, 12:39 PM

## 2023-09-11 NOTE — Plan of Care (Signed)
  Problem: Clinical Measurements: Goal: Ability to maintain clinical measurements within normal limits will improve Outcome: Progressing Goal: Will remain free from infection Outcome: Progressing Goal: Diagnostic test results will improve Outcome: Progressing   Problem: Clinical Measurements: Goal: Diagnostic test results will improve Outcome: Progressing   Problem: Activity: Goal: Risk for activity intolerance will decrease Outcome: Progressing   Problem: Coping: Goal: Level of anxiety will decrease Outcome: Progressing

## 2023-09-11 NOTE — Progress Notes (Signed)
    Subjective:  Patient reports pain as mild.  Denies N/V/CP/SOB/Abd pain.  Has foley now, bright red blood in stool , followed by hospitalist.   She states he knee is doing great. She is eager for discharge home. We discussed barriers to discharge.   Objective:   VITALS:   Vitals:   09/10/23 1633 09/10/23 2210 09/11/23 0529 09/11/23 0542  BP: (!) 165/81 (!) 153/79 (!) 169/98 (!) 167/92  Pulse: 100 98 98 92  Resp: 18 17 16 18   Temp: 98.3 F (36.8 C) 98.2 F (36.8 C) 98.2 F (36.8 C)   TempSrc:  Oral Oral   SpO2: 98%  99% 100%  Weight:      Height:        Patient is sitting up in bed. NAD.  Neurologically intact ABD soft Neurovascular intact Sensation intact distally Intact pulses distally Dorsiflexion/Plantar flexion intact Incision: dressing C/D/I No cellulitis present Compartment soft Doing well with knee ROM.   Lab Results  Component Value Date   WBC 8.9 09/11/2023   HGB 8.8 (L) 09/11/2023   HCT 26.7 (L) 09/11/2023   MCV 93.7 09/11/2023   PLT 336 09/11/2023   BMET    Component Value Date/Time   NA 132 (L) 09/11/2023 0345   NA 137 10/23/2022 1017   K 3.6 09/11/2023 0345   CL 98 09/11/2023 0345   CO2 27 09/11/2023 0345   GLUCOSE 112 (H) 09/11/2023 0345   BUN 10 09/11/2023 0345   BUN 16 10/23/2022 1017   CREATININE 0.37 (L) 09/11/2023 0345   CREATININE 0.62 07/20/2023 0816   CALCIUM 7.9 (L) 09/11/2023 0345   EGFR 95 07/08/2023 1249   EGFR 92 10/23/2022 1017   GFRNONAA >60 09/11/2023 0345   GFRNONAA 89 04/13/2017 0820     Assessment/Plan: 10 Days Post-Op   Principal Problem:   Degenerative arthritis of right knee Active Problems:   S/P total knee arthroplasty, right  ABLA. Hemoglobin 7.1 (7.8 yesterday). Continue to monitor.   WBAT with walker DVT ppx: Aspirin, SCDs, TEDS (aspirin on hold per hospitalist) PO pain control PT/OT: Doing better with PT, orthostatic hypotension improving. She is doing well with exercises and ROM.  Dispo:  -  Hospitalist following, appreciate their recommendations.  - Continue to work with PT.  - D/c pending PT clearance and hospitalist clearance with blood in stools and urinary retention. Hypotension improving.    Oretha Caprice Demetres Prochnow,PA-C 09/11/2023, 8:19 AM   [facility]  [place] [address], [place], [place] [address] Phone: [phone] [email or web address] Facebook  [employer]

## 2023-09-12 DIAGNOSIS — M1711 Unilateral primary osteoarthritis, right knee: Secondary | ICD-10-CM | POA: Diagnosis not present

## 2023-09-12 LAB — CBC WITH DIFFERENTIAL/PLATELET
Abs Immature Granulocytes: 0.75 10*3/uL — ABNORMAL HIGH (ref 0.00–0.07)
Basophils Absolute: 0.1 10*3/uL (ref 0.0–0.1)
Basophils Relative: 1 %
Eosinophils Absolute: 0.2 10*3/uL (ref 0.0–0.5)
Eosinophils Relative: 2 %
HCT: 29.6 % — ABNORMAL LOW (ref 36.0–46.0)
Hemoglobin: 9.2 g/dL — ABNORMAL LOW (ref 12.0–15.0)
Immature Granulocytes: 9 %
Lymphocytes Relative: 16 %
Lymphs Abs: 1.3 10*3/uL (ref 0.7–4.0)
MCH: 30.7 pg (ref 26.0–34.0)
MCHC: 31.1 g/dL (ref 30.0–36.0)
MCV: 98.7 fL (ref 80.0–100.0)
Monocytes Absolute: 0.8 10*3/uL (ref 0.1–1.0)
Monocytes Relative: 9 %
Neutro Abs: 5.1 10*3/uL (ref 1.7–7.7)
Neutrophils Relative %: 63 %
Platelets: 348 10*3/uL (ref 150–400)
RBC: 3 MIL/uL — ABNORMAL LOW (ref 3.87–5.11)
RDW: 14.4 % (ref 11.5–15.5)
WBC: 8.2 10*3/uL (ref 4.0–10.5)
nRBC: 0 % (ref 0.0–0.2)

## 2023-09-12 LAB — BASIC METABOLIC PANEL
Anion gap: 9 (ref 5–15)
BUN: 7 mg/dL — ABNORMAL LOW (ref 8–23)
CO2: 26 mmol/L (ref 22–32)
Calcium: 8 mg/dL — ABNORMAL LOW (ref 8.9–10.3)
Chloride: 97 mmol/L — ABNORMAL LOW (ref 98–111)
Creatinine, Ser: 0.37 mg/dL — ABNORMAL LOW (ref 0.44–1.00)
GFR, Estimated: >60 mL/min (ref >60)
Glucose, Bld: 109 mg/dL — ABNORMAL HIGH (ref 70–99)
Potassium: 3.6 mmol/L (ref 3.5–5.1)
Sodium: 132 mmol/L — ABNORMAL LOW (ref 135–145)

## 2023-09-12 MED ORDER — CHLORHEXIDINE GLUCONATE CLOTH 2 % EX PADS
6.0000 | MEDICATED_PAD | Freq: Every day | CUTANEOUS | Status: DC
  Administered 2023-09-13: 6 via TOPICAL

## 2023-09-12 NOTE — Plan of Care (Signed)
  Problem: Education: Goal: Knowledge of the prescribed therapeutic regimen will improve Outcome: Progressing   Problem: Activity: Goal: Risk for activity intolerance will decrease Outcome: Progressing   Problem: Coping: Goal: Level of anxiety will decrease Outcome: Progressing   Problem: Pain Managment: Goal: General experience of comfort will improve Outcome: Progressing   Problem: Safety: Goal: Ability to remain free from injury will improve Outcome: Progressing

## 2023-09-12 NOTE — Progress Notes (Signed)
Physical Therapy Treatment Patient Details Name: Heather Hoover MRN: [id number] DOB: Feb 11, 1946 Today's Date: 09/12/2023   History of Present Illness 77 yo female presents to therapy s/p R TKA on 09/01/2023 pt with post op anemia, hyponatremia, nausea. Hospitalist consulted 09/07/23. 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    PT Comments  Pt progressing, continues to report dizziness with ambulation, amb  35' x2  requiring seated rest after each distance; BP 143/86 In sitting; after seated rest pt able to amb ~20' and up/down single step;  Pt is amb short household distances and is able to verbalize symptoms, sit and recover; In afternoons pt seems to have less dizziness overall.  I think she is also dealing with some generalized deconditioning at this point. Pt is anxious regarding d/c d/t lack of assist from spouse; we have discussed some household modifications for safety regarding orthostatics/precautions;   Pt may benefit from HHPT initially - and progress to OPPT after a wk or so if OK with MD/PA (to get her home)  qur If plan is discharge home, recommend the following: A little help with walking and/or transfers;A little help with bathing/dressing/bathroom;Assistance with cooking/housework;Assist for transportation;Help with stairs or ramp for entrance   Can travel by private vehicle        Equipment Recommendations  None recommended by PT;Other (comment)    Recommendations for Other Services       Precautions / Restrictions Precautions Precautions: Knee;Fall Precaution Booklet Issued: No Precaution Comments: no pillow under knee Restrictions Weight Bearing Restrictions: No RLE Weight Bearing: Weight bearing as tolerated (Simultaneous filing. User may not have seen previous data.)     Mobility  Bed Mobility Overal bed mobility: Modified  Independent                  Transfers Overall transfer level: Needs assistance Equipment used: Rolling walker (2 wheels) Transfers: Sit to/from Stand Sit to Stand: Supervision           General transfer comment: cues for hand placement, supervision for safety; reports mild dizziness with initial sitting; orthostatics done by nursing earlier    Ambulation/Gait Ambulation/Gait assistance: Supervision Gait Distance (Feet): 35 Feet (x2) Assistive device: Rolling walker (2 wheels) Gait Pattern/deviations: Step-through pattern, Decreased stride length, Decreased weight shift to right       General Gait Details: supervision for safety, chair follow; pt requiring sitting rest -1st d/t pain, 2nd d/t dizziness--BP in sitting 143/86   Stairs Stairs: Yes Stairs assistance: Min assist Stair Management: No rails, Step to pattern, Forwards, With walker Number of Stairs: 1 General stair comments: single step with cues for sequence   Wheelchair Mobility     Tilt Bed    Modified Rankin (Stroke Patients Only)       Balance                                            Cognition Arousal: Alert Behavior During Therapy: WFL for tasks assessed/performed Overall Cognitive Status: Within Functional Limits for tasks assessed  Exercises Total Joint Exercises Ankle Circles/Pumps: AROM, Both, 10 reps Heel Slides:  (pt doing on her own)    General Comments        Pertinent Vitals/Pain Pain Assessment Pain Assessment: 0-10 Pain Score: 4  Pain Location: R knee Pain Descriptors / Indicators: Aching, Sore Pain Intervention(s): Limited activity within patient's tolerance, Monitored during session, Repositioned, Premedicated before session, Ice applied    Home Living                          Prior Function            PT Goals (current goals can now be found in the care plan section)  Acute Rehab PT Goals Patient Stated Goal: to be able to walk in the neighborhood, keep up and go out with my friends PT Goal Formulation: With patient Time For Goal Achievement: 09/15/23 Progress towards PT goals: Progressing toward goals    Frequency    7X/week      PT Plan      Co-evaluation              AM-PAC PT "6 Clicks" Mobility   Outcome Measure  Help needed turning from your back to your side while in a flat bed without using bedrails?: None Help needed moving from lying on your back to sitting on the side of a flat bed without using bedrails?: None Help needed moving to and from a bed to a chair (including a wheelchair)?: A Little Help needed standing up from a chair using your arms (e.g., wheelchair or bedside chair)?: A Little Help needed to walk in hospital room?: A Little Help needed climbing 3-5 steps with a railing? : A Little 6 Click Score: 20    End of Session Equipment Utilized During Treatment: Gait belt Activity Tolerance: Patient tolerated treatment well;Other (comment) (limited by dizziness) Patient left: in chair;with call bell/phone within reach;with chair alarm set   PT Visit Diagnosis: Unsteadiness on feet (R26.81);Other abnormalities of gait and mobility (R26.89);Muscle weakness (generalized) (M62.81);Difficulty in walking, not elsewhere classified (R26.2);Pain Pain - Right/Left: Right Pain - part of body: Knee     Time: 7253-6644 PT Time Calculation (min) (ACUTE ONLY): 29 min  Charges:    $Gait Training: 23-37 mins PT General Charges $$ ACUTE PT VISIT: 1 Visit                     Brycelynn Stampley, PT  Acute Rehab Dept [facility]) [phone]  09/12/2023    [facility] 09/12/2023, 11:39 AM

## 2023-09-12 NOTE — Progress Notes (Signed)
   Subjective: 11 Days Post-Op Procedure(s) (LRB): COMPUTER ASSISTED TOTAL KNEE ARTHROPLASTY (Right) Patient reports pain as mild.   Patient seen in rounds for Dr. Linna Caprice. Patient is resting in recliner mid-PT session during rounds. Has had further issues with dizziness when standing. Knee pain well controlled. Plan is to go Home after hospital stay.  Objective: Vital signs in last 24 hours: Temp:  [97.7 F (36.5 C)-98.5 F (36.9 C)] 98.5 F (36.9 C) (09/22 1007) Pulse Rate:  [94-102] 102 (09/22 1007) Resp:  [16-19] 19 (09/22 1007) BP: (143-182)/(77-91) 143/91 (09/22 1007) SpO2:  [96 %-99 %] 99 % (09/22 1007)  Intake/Output from previous day:  Intake/Output Summary (Last 24 hours) at 09/12/2023 1106 Last data filed at 09/12/2023 0930 Gross per 24 hour  Intake 600 ml  Output 4525 ml  Net -3925 ml    Intake/Output this shift: Total I/O In: 240 [P.O.:240] Out: 1100 [Urine:1100]  Labs: Recent Labs    09/10/23 0336 09/11/23 0345 09/12/23 0328  HGB 7.1* 8.8* 9.2*   Recent Labs    09/11/23 0345 09/12/23 0328  WBC 8.9 8.2  RBC 2.85* 3.00*  HCT 26.7* 29.6*  PLT 336 348   Recent Labs    09/11/23 0345 09/12/23 0328  NA 132* 132*  K 3.6 3.6  CL 98 97*  CO2 27 26  BUN 10 7*  CREATININE 0.37* 0.37*  GLUCOSE 112* 109*  CALCIUM 7.9* 8.0*   No results for input(s): "LABPT", "INR" in the last 72 hours.  Exam: General - Patient is Alert and Oriented Extremity - Neurologically intact Neurovascular intact Sensation intact distally Dorsiflexion/Plantar flexion intact Dressing/Incision - clean, dry, no drainage Motor Function - intact, moving foot and toes well on exam.   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   Weight as of this encounter: 82.6 kg. Up with therapy  DVT Prophylaxis - Aspirin Weight-bearing as tolerated  Further issues with dizziness this AM when ambulating with therapy.  Hemoglobin 9.2 this AM.   Arther Abbott, PA-C Orthopedic Surgery [phone] 09/12/2023, 11:06 AM

## 2023-09-12 NOTE — Plan of Care (Signed)
  Problem: Activity: Goal: Ability to avoid complications of mobility impairment will improve Outcome: Progressing Goal: Range of joint motion will improve Outcome: Progressing   Problem: Clinical Measurements: Goal: Ability to maintain clinical measurements within normal limits will improve Outcome: Progressing Goal: Will remain free from infection Outcome: Progressing Goal: Diagnostic test results will improve Outcome: Progressing

## 2023-09-12 NOTE — Progress Notes (Signed)
PROGRESS NOTE  Heather Hoover  DOB: 1946-05-23  PCP: Heather Brooks, MD JJK:[id number]  DOA: 09/01/2023  LOS: 7 days  Hospital Day: 12  Brief narrative: Heather Hoover is a 77 y.o. female with PMH significant for HTN, HLD, osteoarthritis 9/11, underwent right total knee replacement with Dr. Linna Hoover without complication.  Postop, she recovered well and started working with physical therapy.  But she developed some nausea, dizziness with ambulation and was noted to have orthostatic hypotension.  Labs also showed gradual drop in sodium level to as low as 122.  Her symptoms did not improve and has hospital service was consulted on 9/17.  Subjective: Patient was seen and examined this morning. Pleasant elderly Caucasian female.  Propped up in bed.  Husband available on the phone today. Patient feels better overall.  Blood pressure has been running elevated in 160s.  However, she still had orthostatic drop.  She still feels lightheaded and tired on ambulation.  Mild to moderate bleeding persist.  Hemoglobin improving. Sodium level stable at 132.  Assessment and plan: Orthostatic hypotension H/o hypertension Postop, patient's mobility was limited because of orthostatic hypotension which is probably due to anemia and continues on her blood pressure medicines. PTA meds- carvedilol 6.25 mg twice daily, valsartan 325 mg daily both are currently on hold.   She has been adequately hydrated.  Blood transfusion given. Ortho static vital signs still present but with less severe symptoms. Blood pressure is consistently over 140s for the last 48 hours.  If remains elevated, will plan to resume valsartan tomorrow. Last echo from a year ago showed EF of 60 to 65%, no regional WMA.  Acute blood loss anemia Bleeding hemorrhoids Baseline hemoglobin of 11, patient had intermittently bleeding hemorrhoids for a long time.   With fracture and surgery, hemoglobin gradually dropped to the lowest  of 7.1 on 9/20.  1 unit of PRBC transfusion was given.   Patient also suffered intermittent hemorrhoidal bleeding with the constipation.  Improving now.. Ferritin level is normal ruling out any possible chronic bleeding source. Last colonoscopy was with Dr. Loreta Hoover in 2021. Aspirin 81 mg twice daily as part of the DVT prophylaxis has been resumed. Continue Protonix. Recent Labs    09/08/23 0848 09/09/23 0716 09/10/23 0335 09/10/23 0336 09/10/23 1019 09/11/23 0345 09/12/23 0328  HGB 7.8* 7.8*  --  7.1*  --  8.8* 9.2*  MCV 96.8 97.2  --  91.9  --  93.7 98.7  VITAMINB12  --   --   --  297  --   --   --   FOLATE  --   --   --  11.6  --   --   --   FERRITIN  --   --  142  --   --   --   --   TIBC  --   --  224*  --   --   --   --   IRON  --   --  41  --   --   --   --   RETICCTPCT  --   --   --   --  5.6*  --   --    Hyponatremia It seems she has normal baseline sodium level. Sodium trend as below.  She had an acute steady drop to the lowest of 121.  Serum osmolality was low at 264.  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 Sodium level gradually improved to 132.  With stable sodium and rising blood pressure, I would hold salt tablets today. Recent Labs  Lab 09/06/23 0818 09/07/23 0322 09/07/23 0849 09/07/23 1455 09/07/23 2058 09/08/23 0305 09/09/23 0716 09/10/23 0336 09/11/23 0345 09/12/23 0328  NA 124* 122* 124* 123* 121* 123* 127* 127* 132* 132*   Acute urinary retention Chronic urinary incontinence Patient states she has chronic urinary urgency incontinence but manageable with pads. 9/19, she was noted to have distended abdomen and subsequently drained 1.1 L of urine.  Overnight, she required another and out catheterizing. Foley catheter was inserted.  Needs urology follow-up as an outpatient. I would avoid opioids  S/p Right TKR Osteoarthritis Followed by orthopedics. For pain  management, I would keep her on scheduled Tylenol for now and avoid opioids  Hyperlipidemia Continue Crestor   Mobility: Mobility is impaired.  PT eval obtained.  Home ultimately recommended  Goals of care   Code Status: Full Code     DVT prophylaxis:  SCDs Start: 09/01/23 1258 Place TED hose Start: 09/01/23 1258   Antimicrobials: None Fluid: Not on IV fluid Family Communication: Husband at bedside  Status: Inpatient Level of care:  Med-Surg   Patient is from: Home Needs to continue in-hospital care: Improving orthostatics but still with symptoms.  Stable hemoglobin. Anticipated d/c to: Hopefully home tomorrow with home health PT   Diet:  Diet Order             Diet regular Room service appropriate? Yes; Fluid consistency: Thin  Diet effective now           Diet - low sodium heart healthy                   Scheduled Meds:  acetaminophen  1,000 mg Oral TID   aspirin  81 mg Oral BID   pantoprazole  40 mg Oral Daily   rosuvastatin  20 mg Oral QHS    PRN meds: alum & mag hydroxide-simeth, bismuth subsalicylate, diphenhydrAMINE, menthol-cetylpyridinium **OR** phenol, metoCLOPramide **OR** metoCLOPramide (REGLAN) injection, ondansetron **OR** ondansetron (ZOFRAN) IV, polyvinyl alcohol   Infusions:     Antimicrobials: Anti-infectives (From admission, onward)    Start     Dose/Rate Route Frequency Ordered Stop   09/01/23 1400  ceFAZolin (ANCEF) IVPB 2g/100 mL premix        2 g 200 mL/hr over 30 Minutes Intravenous Every 6 hours 09/01/23 1257 09/01/23 2129   09/01/23 0645  ceFAZolin (ANCEF) IVPB 2g/100 mL premix        2 g 200 mL/hr over 30 Minutes Intravenous On call to O.R. 09/01/23 0633 09/01/23 0905       Objective: Vitals:   09/12/23 1007 09/12/23 1347  BP: (!) 143/91 (!) 146/79  Pulse: (!) 102 96  Resp: 19 15  Temp: 98.5 F (36.9 C) 98.2 F (36.8 C)  SpO2: 99% 99%    Intake/Output Summary (Last 24 hours) at 09/12/2023 1501 Last data  filed at 09/12/2023 1428 Gross per 24 hour  Intake 840 ml  Output 4475 ml  Net -3635 ml   Filed Weights   09/01/23 0704 09/01/23 1257  Weight: 82.6 kg 82.6 kg   Weight change:  Body mass index is 31.26 kg/m.   Physical Exam: General exam: Pleasant elderly Caucasian female.  Not in pain.  Not in pain Skin: No rashes, lesions or ulcers. 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  F/u labs ordered Unresulted Labs (From admission, onward)     Start     Ordered   09/11/23 0500  CBC with Differential/Platelet  Daily,   R      09/10/23 1747   09/11/23 0500  Basic metabolic panel  Daily,   R      09/10/23 1747   09/09/23 1345  Protein Electro, Random Urine  Once,   R        09/09/23 1344   09/07/23 0841  Sodium, urine, random  Once,   R        09/07/23 0840   09/07/23 0840  Osmolality, urine  Once,   R        09/07/23 0840            Total time spent in review of labs and imaging, patient evaluation, formulation of plan, documentation and communication with family: 45 minutes  Signed, Lorin Glass, MD [facility] 09/12/2023

## 2023-09-12 NOTE — Progress Notes (Addendum)
PT TX NOTE   09/12/23 1500  PT Visit Information  Last PT Received On 09/12/23  Assistance Needed Pt with some dizziness in am with amb, no dizziness in pm, endorses fatigue.  Pt is amb short household distances and is able to verbalize symptoms, sit and recover if needed;  In afternoons pt seems to have less dizziness overall.  Pt  also appears to be dealing with some generalized deconditioning at this point. Pt is anxious regarding d/c home d/t lack of assist from spouse; we have discussed some household modifications for safety regarding orthostatics/precautions;  Pt should be ready to d/c tomorrow, would benefit from HHPT initially if MD agreeable.  History of Present Illness 77 yo female presents to therapy s/p R TKA on 09/01/2023 pt with post op anemia, hyponatremia, nausea. Hospitalist consulted 09/07/23. 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  Precautions  Precautions Knee;Fall  Precaution Booklet Issued No  Precaution Comments no pillow under knee  Restrictions  Weight Bearing Restrictions No  RLE Weight Bearing WBAT  Pain Assessment  Pain Assessment 0-10  Pain Score 6  Pain Location R knee with AAROM and exercises  Pain Descriptors / Indicators Aching;Sore;Tightness  Pain Intervention(s) Limited activity within patient's tolerance;Monitored during session;Premedicated before session;Repositioned  Cognition  Arousal Alert  Behavior During Therapy WFL for tasks assessed/performed  Overall Cognitive Status Within Functional Limits for tasks assessed  Bed Mobility  Overal bed mobility Needs Assistance  Supine to sit Supervision;Modified independent (Device/Increase time)  General bed mobility comments no physical assist, able to progress RLE off bed without leg lifter  Transfers  Overall transfer level Needs assistance  Equipment used Rolling  walker (2 wheels)  Transfers Sit to/from Stand  Sit to Stand Supervision  General transfer comment cues for hand placement  Ambulation/Gait  Ambulation/Gait assistance Supervision;Contact guard assist  Gait Distance (Feet) 40 Feet  Assistive device Rolling walker (2 wheels)  Gait Pattern/deviations Step-through pattern;Decreased stride length;Decreased weight shift to right  General Gait Details denies dizziness, endorses fatigue and knee pain. seated rest after above distance  Balance  Sitting balance-Leahy Scale Good  Standing balance-Leahy Scale Fair  Total Joint Exercises  Ankle Circles/Pumps AROM;Both;10 reps  Quad Sets AROM;Both;10 reps;Supine  Short Arc Quad AROM;Strengthening;Right;10 reps  Heel Slides AAROM;Right;10 reps  Straight Leg Raises AROM;Strengthening;Right;10 reps  Hip ABduction/ADduction AROM;Right;10 reps  Knee Flexion AAROM;Right;5 reps;Seated  Goniometric ROM grossly 5 to 80 degrees AAROM  General Exercises - Lower Extremity  Heel Raises AROM;Both;5 reps;Standing  PT - End of Session  Equipment Utilized During Treatment Gait belt  Activity Tolerance Patient tolerated treatment well  Patient left with call bell/phone within reach;in chair;with chair alarm set   PT - Assessment/Plan  PT Visit Diagnosis Unsteadiness on feet (R26.81);Other abnormalities of gait and mobility (R26.89);Muscle weakness (generalized) (M62.81);Difficulty in walking, not elsewhere classified (R26.2);Pain  Pain - Right/Left Right  Pain - part of body Knee  PT Frequency (ACUTE ONLY) 7X/week  Follow Up Recommendations Follow physician's recommendations for discharge plan and follow up therapies  Patient can return home with the following A little help with walking and/or transfers;A little help with bathing/dressing/bathroom;Assistance with cooking/housework;Assist for transportation;Help with stairs or ramp for entrance  PT equipment None recommended by PT;Other (comment) (?w/c)  AM-PAC  PT "6 Clicks" Mobility Outcome Measure (Version 2)  Help needed turning from your back to your  side while in a flat bed without using bedrails? 4  Help needed moving from lying on your back to sitting on the side of a flat bed without using bedrails? 4  Help needed moving to and from a bed to a chair (including a wheelchair)? 3  Help needed standing up from a chair using your arms (e.g., wheelchair or bedside chair)? 3  Help needed to walk in hospital room? 3  Help needed climbing 3-5 steps with a railing?  3  6 Click Score 20  Consider Recommendation of Discharge To: Home with no services  PT Goal Progression  Progress towards PT goals Progressing toward goals  Acute Rehab PT Goals  PT Goal Formulation With patient  Time For Goal Achievement 09/15/23  PT Time Calculation  PT Start Time (ACUTE ONLY) 1501  PT Stop Time (ACUTE ONLY) 1529  PT Time Calculation (min) (ACUTE ONLY) 28 min  PT General Charges  $$ ACUTE PT VISIT 1 Visit  PT Treatments  $Gait Training 8-22 mins  $Therapeutic Exercise 8-22 mins

## 2023-09-13 DIAGNOSIS — M1711 Unilateral primary osteoarthritis, right knee: Secondary | ICD-10-CM | POA: Diagnosis not present

## 2023-09-13 LAB — BASIC METABOLIC PANEL
Anion gap: 10 (ref 5–15)
BUN: 8 mg/dL (ref 8–23)
CO2: 26 mmol/L (ref 22–32)
Calcium: 8.4 mg/dL — ABNORMAL LOW (ref 8.9–10.3)
Chloride: 96 mmol/L — ABNORMAL LOW (ref 98–111)
Creatinine, Ser: 0.32 mg/dL — ABNORMAL LOW (ref 0.44–1.00)
GFR, Estimated: >60 mL/min (ref >60)
Glucose, Bld: 109 mg/dL — ABNORMAL HIGH (ref 70–99)
Potassium: 3.8 mmol/L (ref 3.5–5.1)
Sodium: 132 mmol/L — ABNORMAL LOW (ref 135–145)

## 2023-09-13 LAB — CBC WITH DIFFERENTIAL/PLATELET
Abs Immature Granulocytes: 0.64 10*3/uL — ABNORMAL HIGH (ref 0.00–0.07)
Basophils Absolute: 0.1 10*3/uL (ref 0.0–0.1)
Basophils Relative: 1 %
Eosinophils Absolute: 0.2 10*3/uL (ref 0.0–0.5)
Eosinophils Relative: 2 %
HCT: 29.7 % — ABNORMAL LOW (ref 36.0–46.0)
Hemoglobin: 9.6 g/dL — ABNORMAL LOW (ref 12.0–15.0)
Immature Granulocytes: 7 %
Lymphocytes Relative: 15 %
Lymphs Abs: 1.3 10*3/uL (ref 0.7–4.0)
MCH: 31.2 pg (ref 26.0–34.0)
MCHC: 32.3 g/dL (ref 30.0–36.0)
MCV: 96.4 fL (ref 80.0–100.0)
Monocytes Absolute: 0.8 10*3/uL (ref 0.1–1.0)
Monocytes Relative: 9 %
Neutro Abs: 5.7 10*3/uL (ref 1.7–7.7)
Neutrophils Relative %: 66 %
Platelets: 336 10*3/uL (ref 150–400)
RBC: 3.08 MIL/uL — ABNORMAL LOW (ref 3.87–5.11)
RDW: 14.4 % (ref 11.5–15.5)
WBC: 8.7 10*3/uL (ref 4.0–10.5)
nRBC: 0 % (ref 0.0–0.2)

## 2023-09-13 LAB — BPAM RBC
Blood Product Expiration Date: 202410202359
ISSUE DATE / TIME: 202409201255
Unit Type and Rh: 5100

## 2023-09-13 LAB — TYPE AND SCREEN
ABO/RH(D): O POS
Antibody Screen: NEGATIVE
Unit division: 0

## 2023-09-13 MED ORDER — SENNOSIDES-DOCUSATE SODIUM 8.6-50 MG PO TABS: 1.0000 | ORAL_TABLET | Freq: Every day | ORAL | Status: AC

## 2023-09-13 MED ORDER — VALSARTAN 160 MG PO TABS: 160.0000 mg | ORAL_TABLET | Freq: Every day | ORAL | 0 refills | Status: DC

## 2023-09-13 MED ORDER — ASPIRIN 81 MG PO CHEW: 81.0000 mg | CHEWABLE_TABLET | Freq: Two times a day (BID) | ORAL | 0 refills | Status: AC

## 2023-09-13 MED ORDER — PANTOPRAZOLE SODIUM 40 MG PO TBEC
40.0000 mg | DELAYED_RELEASE_TABLET | Freq: Every day | ORAL | 0 refills | Status: DC
Start: 2023-09-13 — End: 2023-12-29

## 2023-09-13 NOTE — Progress Notes (Addendum)
    Subjective:  Patient reports pain as mild.  Denies N/V/CP/SOB/Abd pain. She reports her pain is well controlled. She states she did well with PT yesterday. She is a little behind in terms of knee motion, we discussed importance of OPPT vs HHPT, patient in agreement with this plan. She is very eager for d/c home today.   Objective:   VITALS:   Vitals:   09/12/23 1007 09/12/23 1347 09/12/23 2101 09/13/23 0612  BP: (!) 143/91 (!) 146/79 (!) 158/93 (!) 169/83  Pulse: (!) 102 96 94 96  Resp: 19 15 17 16   Temp: 98.5 F (36.9 C) 98.2 F (36.8 C) 98.4 F (36.9 C) 98.4 F (36.9 C)  TempSrc: Oral  Oral Oral  SpO2: 99% 99% 100% 97%  Weight:      Height:        Patient sitting up in bed. NAD.  Neurologically intact ABD soft Neurovascular intact Sensation intact distally Intact pulses distally Dorsiflexion/Plantar flexion intact Incision: dressing C/D/I No cellulitis present Compartment soft   Lab Results  Component Value Date   WBC 8.7 09/13/2023   HGB 9.6 (L) 09/13/2023   HCT 29.7 (L) 09/13/2023   MCV 96.4 09/13/2023   PLT 336 09/13/2023   BMET    Component Value Date/Time   NA 132 (L) 09/13/2023 0327   NA 137 10/23/2022 1017   K 3.8 09/13/2023 0327   CL 96 (L) 09/13/2023 0327   CO2 26 09/13/2023 0327   GLUCOSE 109 (H) 09/13/2023 0327   BUN 8 09/13/2023 0327   BUN 16 10/23/2022 1017   CREATININE 0.32 (L) 09/13/2023 0327   CREATININE 0.62 07/20/2023 0816   CALCIUM 8.4 (L) 09/13/2023 0327   EGFR 95 07/08/2023 1249   EGFR 92 10/23/2022 1017   GFRNONAA >60 09/13/2023 0327   GFRNONAA 89 04/13/2017 0820     Assessment/Plan: 12 Days Post-Op   Principal Problem:   Degenerative arthritis of right knee Active Problems:   S/P total knee arthroplasty, right  ABLA. Hemoglobin improving 9.6.  Hyponatremia - sodium improving 132 today.   WBAT with walker DVT ppx: Aspirin, SCDs, TEDS PO pain control PT/OT Dispo:  - Hospitalist following, appreciate their  recommendations.  - D/c home with OPPT once cleared with PT.    Clois Dupes, PA-C 09/13/2023, 11:29 AM   [facility]  [place] [address], [address], [place], [place] [address] Phone: [phone] [email or web address] Facebook  [employer]

## 2023-09-13 NOTE — Discharge Summary (Signed)
Physician Discharge Summary  Heather Hoover NWG:[id number] DOB: 01-25-1946 DOA: 09/01/2023  PCP: Donita Brooks, MD  Admit date: 09/01/2023 Discharge date: 09/13/2023  Admitted From: home Discharge disposition: home  Recommendations at discharge:  Post discharge, resume valsartan at 160 mg daily.  Continue to hold carvedilol.  Continue to monitor blood pressure at home.  Follow-up with PCP/cardiologist for further adjustment. Continue aspirin 81 mg twice daily for next 2 weeks for DVT prophylaxis.  Continue Protonix while on it. Call and make appointment with colorectal surgeon at [facility] for treatment of bleeding hemorrhoids Cautious use of pain medicines Follow-up with PCP in 1 week to check sodium level [facility]up with [facility] for [facility]ing [facility]  Brief narrative: Heather Hoover is a 77 y.o. female with PMH significant for HTN, HLD, osteoarthritis 9/11, underwent right total knee replacement with Dr. Linna Caprice without complication.  Postop, she recovered well and started working with physical therapy.  But she developed some nausea, dizziness with ambulation and was noted to have orthostatic hypotension.  Labs also showed gradual drop in sodium level to as low as 122.  Her symptoms did not improve and hence hospitalist service was consulted on 9/17.  Subjective: Patient was seen and examined this morning. Pleasant elderly Caucasian female.  Propped up in bed.  Feels better.  Wants to go home today.  Assessment and plan: Orthostatic hypotension H/o hypertension Postop, patient's mobility was limited because of orthostatic hypotension which was probably due to blood loss and continuous enough her blood pressure medicines. She was adequately hydrated.  Blood transfusion given. Orthostatic symptoms and blood pressure change improving.   Improving ambulation with PT for the last 2 days. PTA meds- carvedilol 6.25 mg twice daily, valsartan 320 mg  daily.  Patient states valsartan dose was recently increased from 160 mg daily.  Currently both are on hold. Blood pressure is consistently over 140s for the last 48 hours.  At discharge, I would resume valsartan at half dose 160 mg daily.  Continue to hold carvedilol. Continue to monitor blood pressure at home.  Patient follows up with PCP and cardiology Dr. Duke Salvia.  Acute blood loss anemia Bleeding hemorrhoids Baseline hemoglobin of 11, patient had intermittently bleeding hemorrhoids for a long time.   With fracture and surgery, hemoglobin gradually dropped to the lowest of 7.1 on 9/20.  1 unit of PRBC transfusion was given.   Patient also suffered intermittent hemorrhoidal bleeding with the constipation.  Improving now.. Ferritin level is normal ruling out any possible chronic bleeding source. Last colonoscopy was with Dr. Loreta Ave in 2021. For the next 2 weeks, patient to remain on aspirin 81 mg twice daily for DVT prophylaxis. Continue Protonix in the interval. Encouraged to visit colorectal surgeon as an outpatient for hemorrhoid treatment Recent Labs    09/09/23 0716 09/10/23 0335 09/10/23 0336 09/10/23 1019 09/11/23 0345 09/12/23 0328 09/13/23 0327  HGB 7.8*  --  7.1*  --  8.8* 9.2* 9.6*  MCV 97.2  --  91.9  --  93.7 98.7 96.4  VITAMINB12  --   --  297  --   --   --   --   FOLATE  --   --  11.6  --   --   --   --   FERRITIN  --  142  --   --   --   --   --   TIBC  --  224*  --   --   --   --   --  IRON  --  41  --   --   --   --   --   RETICCTPCT  --   --   --  5.6*  --   --   --    Hyponatremia It seems she has normal baseline sodium level. Sodium trend as below.  She had an acute steady drop to the lowest of 121.  Serum osmolality was low at 264.  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 9/20, started on salt tablets 1 g 3 times daily.  Gradually improved sodium level and has salt tablets were stopped.  For  the last 3 days, sodium level is stable.  Follow-up with PCP for further monitoring. Recent Labs  Lab 09/07/23 0322 09/07/23 0849 09/07/23 1455 09/07/23 2058 09/08/23 0305 09/09/23 0716 09/10/23 0336 09/11/23 0345 09/12/23 0328 09/13/23 0327  NA 122* 124* 123* 121* 123* 127* 127* 132* 132* 132*   Acute urinary retention Chronic urinary incontinence Patient states she has chronic urinary urgency incontinence but manageable with pads. 9/19, she was noted to have distended abdomen and subsequently drained 1.1 L of urine.  Overnight, she required another and out catheterizing. Foley catheter was inserted.  I would maintain Foley catheter at discharge.  Needs urology follow-up as an outpatient for voiding trial  S/p Right TKR Osteoarthritis Followed by orthopedics. For pain management, continue scheduled Tylenol and PRN opioids  Hyperlipidemia Continue Crestor  Impaired mobility Seen by PT.  Home with PT recommended   Goals of care   Code Status: Full Code   Wounds:  - Incision (Closed) 09/01/23 Knee Right (Active)  Date First Assessed/Time First Assessed: 09/01/23 0931   Location: Knee  Location Orientation: Right    Assessments 09/01/2023 11:00 AM 09/13/2023  8:00 AM  Dressing Type Silver hydrofiber Silver hydrofiber  Dressing Clean, Dry, Intact Clean, Dry, Intact  Site / Wound Assessment Dressing in place / Unable to assess Dressing in place / Unable to assess  Drainage Amount None --     No associated orders.    Discharge Exam:   Vitals:   09/12/23 1007 09/12/23 1347 09/12/23 2101 09/13/23 0612  BP: (!) 143/91 (!) 146/79 (!) 158/93 (!) 169/83  Pulse: (!) 102 96 94 96  Resp: 19 15 17 16   Temp: 98.5 F (36.9 C) 98.2 F (36.8 C) 98.4 F (36.9 C) 98.4 F (36.9 C)  TempSrc: Oral  Oral Oral  SpO2: 99% 99% 100% 97%  Weight:      Height:        Body mass index is 31.26 kg/m.   General exam: Pleasant elderly Caucasian female.  Not in pain.  Not in  pain Skin: No rashes, lesions or ulcers. 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 Schedule an appointment as soon as possible for a visit in 2 week(s).   Specialty: Orthopedic Surgery Why: For suture removal, For wound re-check Contact information: [address] [place] [address] [phone]         Donita Brooks, MD Follow up.   Specialty: Family Medicine Contact information: [phone] [address] [place]  [place] [address] [phone]         Chilton Si, MD Follow up.   Specialty: Cardiology Contact information: [address] [place] [address] [phone]         [facility] Follow up.   Contact information: [address] [place] [address] [phone]        [facility], [place] Follow up.   Specialty: General Surgery Why: Colorectal surgeon for hemorrhoids Contact information: [address] [place] [address] [phone]                 Discharge Instructions:   Discharge Instructions     Call MD / Call 911   Complete by: As directed    If you experience chest pain or shortness of breath, CALL 911 and be transported to the hospital emergency room.  If you develope a fever above 101 F, pus (white drainage) or increased drainage or redness at the wound, or calf pain, call your surgeon's office.   Call MD for:  difficulty breathing, headache or visual disturbances   Complete by: As directed    Call MD for:  extreme fatigue   Complete by: As directed    Call MD for:   hives   Complete by: As directed    Call MD for:  persistant dizziness or light-headedness   Complete by: As directed    Call MD for:  persistant nausea and vomiting   Complete by: As directed    Call MD for:  severe uncontrolled pain   Complete by: As directed    Call MD for:  temperature >100.4   Complete by: As directed    Change dressing   Complete by: As directed    Do not remove your dressing.   Constipation Prevention   Complete by: As directed    Drink plenty of fluids.  Prune juice may be helpful.  You may use a stool softener, such as Colace (over the counter) 100 mg twice a day.  Use MiraLax (over the counter) for constipation as needed.   Diet - low sodium heart healthy   Complete by: As directed    Diet general   Complete by: As directed    Discharge instructions   Complete by: As directed    Elevate toes above nose. Use cryotherapy as needed for pain and swelling.   Discharge instructions   Complete by: As directed    Recommendations at discharge:   Post discharge, resume valsartan at 160 mg daily.  Continue to hold carvedilol.  Continue to monitor blood pressure at home.  Follow-up with PCP/cardiologist for further adjustment.  Continue aspirin 81 mg twice daily for next 2 weeks for DVT prophylaxis.  Continue Protonix while on it.  Call and make appointment with colorectal surgeon at  [facility] for treatment of bleeding hemorrhoids  Cautious use of pain medicines  Follow-up with PCP in 1 week to check sodium level  [facility] for [facility]ing [facility]  General discharge instructions: Follow with Primary MD Donita Brooks, MD in 7 days  Please request your PCP  to go over your hospital tests, procedures, radiology results at the follow up. Please get your medicines reviewed and adjusted.  Your PCP may decide to repeat certain labs or tests as needed. Do not drive, operate heavy machinery, perform activities at heights, swimming or  participation in water activities or provide baby sitting services if your were  admitted for syncope or siezures until you have seen by Primary MD or a Neurologist and advised to do so again. [place] Controlled Substance Reporting System database was reviewed. Do not drive, operate heavy machinery, perform activities at heights, swim, participate in water activities or provide baby-sitting services while on medications for pain, sleep and mood until your outpatient physician has reevaluated you and advised to do so again.  You are strongly recommended to comply with the dose, frequency and duration of prescribed medications. Activity: As tolerated with Full fall precautions use walker/cane & assistance as needed Avoid using any recreational substances like cigarette, tobacco, alcohol, or non-prescribed drug. If you experience worsening of your admission symptoms, develop shortness of breath, life threatening emergency, suicidal or homicidal thoughts you must seek medical attention immediately by calling 911 or calling your MD immediately  if symptoms less severe. You must read complete instructions/literature along with all the possible adverse reactions/side effects for all the medicines you take and that have been prescribed to you. Take any new medicine only after you have completely understood and accepted all the possible adverse reactions/side effects.  Wear Seat belts while driving. You were cared for by a hospitalist during your hospital stay. If you have any questions about your discharge medications or the care you received while you were in the hospital after you are discharged, you can call the unit and ask to speak with the hospitalist or the covering physician. Once you are discharged, your primary care physician will handle any further medical issues. Please note that NO REFILLS for any discharge medications will be authorized once you are discharged, as it is imperative that you return  to your primary care physician (or establish a relationship with a primary care physician if you do not have one).   Discharge patient   Complete by: As directed    Discharge disposition: 01-Home or Self Care   Discharge patient date: 09/03/2023   Discharge wound care:   Complete by: As directed    Do not put a pillow under the knee. Place it under the heel.   Complete by: As directed    Driving restrictions   Complete by: As directed    No driving for 6 weeks   Increase activity slowly   Complete by: As directed    Increase activity slowly as tolerated   Complete by: As directed    Lifting restrictions   Complete by: As directed    No lifting for 6 weeks   Post-operative opioid taper instructions:   Complete by: As directed    POST-OPERATIVE OPIOID TAPER INSTRUCTIONS: It is important to wean off of your opioid medication as soon as possible. If you do not need pain medication after your surgery it is ok to stop day one. Opioids include: Codeine, Hydrocodone(Norco, Vicodin), Oxycodone(Percocet, oxycontin) and hydromorphone amongst others.  Long term and even short term use of opiods can cause: Increased pain response Dependence Constipation Depression Respiratory depression And more.  Withdrawal symptoms can include Flu like symptoms Nausea, vomiting And more Techniques to manage these symptoms Hydrate well Eat regular healthy meals Stay active Use relaxation techniques(deep breathing, meditating, yoga) Do Not substitute Alcohol to help with tapering If you have been on opioids for less than two weeks and do not have pain than it is ok to stop all together.  Plan to wean off of opioids This plan should start within one week post op of your joint replacement. Maintain the same interval or  time between taking each dose and first decrease the dose.  Cut the total daily intake of opioids by one tablet each day Next start to increase the time between doses. The last dose  that should be eliminated is the evening dose.      TED hose   Complete by: As directed    Use stockings (TED hose) for 2 weeks on both leg(s).  You may remove them at night for sleeping.   Weight bearing as tolerated   Complete by: As directed        Discharge Medications:   Allergies as of 09/13/2023       Reactions   Augmentin [amoxicillin-pot Clavulanate] Diarrhea   Did it involve swelling of the face/tongue/throat, SOB, or low BP? yes Did it involve sudden or severe rash/hives, skin peeling, or any reaction on the inside of your mouth or nose? no Did you need to seek medical attention at a hospital or doctor's office? no When did it last happen?      2000 If all above answers are "NO", may proceed with cephalosporin use.   Niacin-lovastatin Er    Flushing,itching,syncope   Kenalog [triamcinolone] Palpitations        Medication List     STOP taking these medications    carvedilol 6.25 MG tablet Commonly known as: COREG   meloxicam 15 MG tablet Commonly known as: MOBIC   potassium chloride 10 MEQ tablet Commonly known as: KLOR-CON   Salonpas Pain Relief Patch 3-10 % Ptch   Voltaren 1 % Gel Generic drug: diclofenac Sodium       TAKE these medications    acetaminophen 325 MG tablet Commonly known as: TYLENOL Take 650 mg by mouth every 6 (six) hours as needed (pain.).   alendronate 70 MG tablet Commonly known as: FOSAMAX Take 1 tablet (70 mg total) by mouth every 7 (seven) days. Take with a full glass of water on an empty stomach. What changed: when to take this   aspirin 81 MG chewable tablet Commonly known as: Aspirin Childrens Chew 1 tablet (81 mg total) by mouth 2 (two) times daily with a meal for 14 days.   bismuth subsalicylate 262 MG/15ML suspension Commonly known as: PEPTO BISMOL Take 30 mLs by mouth every 6 (six) hours as needed for diarrhea or loose stools or indigestion.   bismuth subsalicylate 262 MG chewable tablet Commonly known as:  PEPTO BISMOL Chew 262-524 mg by mouth 4 (four) times daily as needed for diarrhea or loose stools or indigestion.   carboxymethylcellulose 0.5 % Soln Commonly known as: REFRESH PLUS Place 1 drop into both eyes 3 (three) times daily as needed (dry/irritated eyes.).   clotrimazole 1 % external solution Commonly known as: LOTRIMIN Apply 1 Application topically 2 (two) times daily as needed (toe nail fungus).   oxyCODONE 5 MG immediate release tablet Commonly known as: Roxicodone Take 1 tablet (5 mg total) by mouth every 4 (four) hours as needed for severe pain.   pantoprazole 40 MG tablet Commonly known as: Protonix Take 1 tablet (40 mg total) by mouth daily for 14 days.   PEPCID PO Take 10 mg by mouth in the morning.   PreviDent 5000 Dry Mouth 1.1 % Gel dental gel Generic drug: sodium fluoride Place 1 Application onto teeth in the morning.   rosuvastatin 20 MG tablet Commonly known as: CRESTOR TAKE 1 TABLET(20 MG) BY MOUTH DAILY What changed: See the new instructions.   senna-docusate 8.6-50 MG tablet Commonly known as:  Senokot-S Take 1 tablet by mouth daily.   valsartan 160 MG tablet Commonly known as: DIOVAN Take 1 tablet (160 mg total) by mouth daily. What changed:  medication strength how much to take   VITAMIN D-3 PO Take 2,000 Units by mouth in the morning.               Discharge Care Instructions  (From admission, onward)           Start     Ordered   09/13/23 0000  Discharge wound care:        09/13/23 1245   09/02/23 0000  Weight bearing as tolerated        09/02/23 0736   09/02/23 0000  Change dressing       Comments: Do not remove your dressing.   09/02/23 0736             The results of significant diagnostics from this hospitalization (including imaging, microbiology, ancillary and laboratory) are listed below for reference.    Procedures and Diagnostic Studies:   DG Knee Right Port  Result Date: 09/01/2023 CLINICAL DATA:   Status post knee replacement EXAM: PORTABLE RIGHT KNEE - 1-2 VIEW COMPARISON:  Knee radiographs 07/10/2021 FINDINGS: Postsurgical changes reflecting right knee arthroplasty are seen. Hardware alignment is within expected limits, without evidence of complication. There is expected surrounding postoperative soft tissue gas. IMPRESSION: Status post right knee arthroplasty without evidence of complication. Electronically Signed   By: Lesia Hausen M.D.   On: 09/01/2023 12:43     Labs:   Basic Metabolic Panel: Recent Labs  Lab 09/09/23 0716 09/10/23 0336 09/11/23 0345 09/12/23 0328 09/13/23 0327  NA 127* 127* 132* 132* 132*  K 4.0 4.3 3.6 3.6 3.8  CL 92* 97* 98 97* 96*  CO2 25 21* 27 26 26   GLUCOSE 111* 95 112* 109* 109*  BUN 12 10 10  7* 8  CREATININE 0.45 0.34* 0.37* 0.37* 0.32*  CALCIUM 8.0* 7.6* 7.9* 8.0* 8.4*   GFR Estimated Creatinine Clearance: 62.2 mL/min (A) (by C-G formula based on SCr of 0.32 mg/dL (L)). Liver Function Tests: No results for input(s): "AST", "ALT", "ALKPHOS", "BILITOT", "PROT", "ALBUMIN" in the last 168 hours. No results for input(s): "LIPASE", "AMYLASE" in the last 168 hours. No results for input(s): "AMMONIA" in the last 168 hours. Coagulation profile No results for input(s): "INR", "PROTIME" in the last 168 hours.  CBC: Recent Labs  Lab 09/09/23 0716 09/10/23 0336 09/11/23 0345 09/12/23 0328 09/13/23 0327  WBC 7.2 7.0 8.9 8.2 8.7  NEUTROABS  --  5.0 6.3 5.1 5.7  HGB 7.8* 7.1* 8.8* 9.2* 9.6*  HCT 24.4* 20.5* 26.7* 29.6* 29.7*  MCV 97.2 91.9 93.7 98.7 96.4  PLT 302 PLATELET CLUMPS NOTED ON SMEAR, UNABLE TO ESTIMATE 336 348 336   Cardiac Enzymes: No results for input(s): "CKTOTAL", "CKMB", "CKMBINDEX", "TROPONINI" in the last 168 hours. BNP: Invalid input(s): "POCBNP" CBG: No results for input(s): "GLUCAP" in the last 168 hours. D-Dimer No results for input(s): "DDIMER" in the last 72 hours. Hgb A1c No results for input(s): "HGBA1C" in the  last 72 hours. Lipid Profile No results for input(s): "CHOL", "HDL", "LDLCALC", "TRIG", "CHOLHDL", "LDLDIRECT" in the last 72 hours. Thyroid function studies No results for input(s): "TSH", "T4TOTAL", "T3FREE", "THYROIDAB" in the last 72 hours.  Invalid input(s): "FREET3" Anemia work up No results for input(s): "VITAMINB12", "FOLATE", "FERRITIN", "TIBC", "IRON", "RETICCTPCT" in the last 72 hours. Microbiology No results found for this or any previous  visit (from the past 240 hour(s)).  Time coordinating discharge: 45 minutes  Signed: Ruthel Martine  [facility] 09/13/2023, 3:25 PM

## 2023-09-13 NOTE — Plan of Care (Signed)
  Problem: Coping: Goal: Level of anxiety will decrease 09/13/2023 0401 by Kizzie Bane, RN Outcome: Progressing 09/13/2023 0400 by Kizzie Bane, RN Outcome: Progressing   Problem: Pain Managment: Goal: General experience of comfort will improve 09/13/2023 0401 by Kizzie Bane, RN Outcome: Progressing 09/13/2023 0400 by Kizzie Bane, RN Outcome: Progressing   Problem: Safety: Goal: Ability to remain free from injury will improve 09/13/2023 0401 by Kizzie Bane, RN Outcome: Progressing 09/13/2023 0400 by Kizzie Bane, RN Outcome: Progressing

## 2023-09-13 NOTE — Plan of Care (Signed)
  Problem: Coping: Goal: Level of anxiety will decrease Outcome: Progressing   Problem: Pain Managment: Goal: General experience of comfort will improve Outcome: Progressing   

## 2023-09-13 NOTE — Progress Notes (Signed)
Physical Therapy Treatment Patient Details Name: Heather Hoover MRN: [id number] DOB: Oct 15, 1946 Today's Date: 09/13/2023   History of Present Illness 77 yo female presents to therapy s/p R TKA on 09/01/2023 pt with post op anemia, hyponatremia, nausea. Hospitalist consulted 09/07/23. 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    PT Comments  Pt continues to steadily progress. Amb ~40' x2 with RW, reporting fatigue, denies dizziness; reviewed up/down single step with pt and husband; safety precautions in place at home per husband.  Emphasized importance of knee flexion/HEP at home as pt has pushed her OPPT out for a wk  Pt is ready to d/c from PT standpoint with family assist.     If plan is discharge home, recommend the following: A little help with walking and/or transfers;A little help with bathing/dressing/bathroom;Assistance with cooking/housework;Assist for transportation;Help with stairs or ramp for entrance   Can travel by private vehicle        Equipment Recommendations  None recommended by PT    Recommendations for Other Services       Precautions / Restrictions Precautions Precautions: Knee;Fall Precaution Booklet Issued: No Precaution Comments: no pillow under knee Restrictions Weight Bearing Restrictions: No RLE Weight Bearing: Weight bearing as tolerated     Mobility  Bed Mobility Overal bed mobility: Needs Assistance       Supine to sit: Modified independent (Device/Increase time) Sit to supine: Modified independent (Device/Increase time)   General bed mobility comments: no physical assist, no use of rails    Transfers Overall transfer level: Needs assistance Equipment used: Rolling walker (2 wheels) Transfers: Sit to/from Stand Sit to Stand: Supervision, Modified independent (Device/Increase time)           General  transfer comment: cues for hand placement    Ambulation/Gait Ambulation/Gait assistance: Supervision Gait Distance (Feet): 40 Feet (x2) Assistive device: Rolling walker (2 wheels) Gait Pattern/deviations: Step-through pattern       General Gait Details: denies dizziness, endorses fatigue and knee pain. seated rest after above distance   Stairs Stairs: Yes Stairs assistance: Contact guard assist Stair Management: No rails, Step to pattern, Forwards, With walker Number of Stairs: 1 (x2) General stair comments: single step with cues for sequence, CGA  for safety; husband present for session and able toprovide CGA   Wheelchair Mobility     Tilt Bed    Modified Rankin (Stroke Patients Only)       Balance     Sitting balance-Leahy Scale: Good       Standing balance-Leahy Scale: Fair                              Cognition Arousal: Alert Behavior During Therapy: WFL for tasks assessed/performed Overall Cognitive Status: Within Functional Limits for tasks assessed                                          Exercises Total Joint Exercises Quad Sets: AROM, Both, 10 reps, Supine Heel Slides: AAROM, Right, 10 reps    General Comments        Pertinent Vitals/Pain Pain Assessment Pain Assessment: 0-10 Pain Score: 3  Pain Location: R knee with AAROM and exercises Pain Descriptors / Indicators: Aching,  Sore, Tightness Pain Intervention(s): Limited activity within patient's tolerance, Monitored during session, Premedicated before session, Ice applied, Repositioned    Home Living                          Prior Function            PT Goals (current goals can now be found in the care plan section) Acute Rehab PT Goals PT Goal Formulation: With patient Time For Goal Achievement: 09/15/23 Progress towards PT goals: Progressing toward goals    Frequency    7X/week      PT Plan      Co-evaluation               AM-PAC PT "6 Clicks" Mobility   Outcome Measure  Help needed turning from your back to your side while in a flat bed without using bedrails?: None Help needed moving from lying on your back to sitting on the side of a flat bed without using bedrails?: None Help needed moving to and from a bed to a chair (including a wheelchair)?: None Help needed standing up from a chair using your arms (e.g., wheelchair or bedside chair)?: A Little Help needed to walk in hospital room?: A Little Help needed climbing 3-5 steps with a railing? : A Little 6 Click Score: 21    End of Session Equipment Utilized During Treatment: Gait belt Activity Tolerance: Patient tolerated treatment well Patient left: with call bell/phone within reach;in chair;with chair alarm set;with family/visitor present   PT Visit Diagnosis: Unsteadiness on feet (R26.81);Other abnormalities of gait and mobility (R26.89);Muscle weakness (generalized) (M62.81);Difficulty in walking, not elsewhere classified (R26.2) Pain - part of body: Knee     Time: 1205-1225 PT Time Calculation (min) (ACUTE ONLY): 20 min  Charges:    $Gait Training: 8-22 mins PT General Charges $$ ACUTE PT VISIT: 1 Visit                     Damiean Lukes, PT  Acute Rehab Dept [facility]) [phone]  09/13/2023    [facility] 09/13/2023, 12:43 PM

## 2023-09-13 NOTE — Progress Notes (Signed)
Patient discharged to home w/ husband. Given all belongings, instructions. Patient able to demonstrate foley and pericare, bag change and verbalized understanding of follow up.  Provided patient w/ foley care materials and bags, bag changed to leg bag prior to d/c. Husband present for all teaching. Escorted to pov via w/c.

## 2023-09-14 ENCOUNTER — Encounter: Payer: Self-pay | Admitting: Family Medicine

## 2023-09-14 ENCOUNTER — Encounter (HOSPITAL_BASED_OUTPATIENT_CLINIC_OR_DEPARTMENT_OTHER): Payer: Self-pay | Admitting: Cardiovascular Disease

## 2023-09-14 ENCOUNTER — Telehealth: Payer: Self-pay

## 2023-09-14 NOTE — Transitions of Care (Post Inpatient/ED Visit) (Signed)
09/14/2023  Name: Heather Hoover MRN: [id number] DOB: 14-Feb-1946  Today's TOC FU Call Status: Today's TOC FU Call Status:: Successful TOC FU Call Completed TOC FU Call Complete Date: 09/14/23 Patient's Name and Date of Birth confirmed.  Transition Care Management Follow-up Telephone Call Date of Discharge: 09/13/23 Discharge Facility: [facility]) Type of Discharge: Inpatient Admission Primary Inpatient Discharge Diagnosis:: knee replacement How have you been since you were released from the hospital?: Better Any questions or concerns?: No  Items Reviewed: Did you receive and understand the discharge instructions provided?: Yes Medications obtained,verified, and reconciled?: Yes (Medications Reviewed) Any new allergies since your discharge?: No Dietary orders reviewed?: Yes Do you have support at home?: Yes People in Home: spouse  Medications Reviewed Today: Medications Reviewed Today     Reviewed by Karena Addison, LPN (Licensed Practical Nurse) on 09/14/23 at [phone]  Med List Status: <None>   Medication Order Taking? Sig Documenting Provider Last Dose Status Informant  acetaminophen (TYLENOL) 325 MG tablet [id number] No Take 650 mg by mouth every 6 (six) hours as needed (pain.). [provider] 08/31/2023 Active Self  alendronate (FOSAMAX) 70 MG tablet [id number] No Take 1 tablet (70 mg total) by mouth every 7 (seven) days. Take with a full glass of water on an empty stomach.  Patient taking differently: Take 70 mg by mouth every Thursday. Take with a full glass of water on an empty stomach.   Donita Brooks, MD Past Week Active Self  aspirin (ASPIRIN CHILDRENS) 81 MG chewable tablet [id number]  Chew 1 tablet (81 mg total) by mouth 2 (two) times daily with a meal for 14 days. Lorin Glass, MD  Active   bismuth subsalicylate (PEPTO BISMOL) 262 MG chewable tablet [id number]  Chew 262-524 mg by mouth 4 (four) times daily as needed for diarrhea or loose stools or  indigestion. [provider]  Active Self  bismuth subsalicylate (PEPTO BISMOL) 262 MG/15ML suspension [id number]  Take 30 mLs by mouth every 6 (six) hours as needed for diarrhea or loose stools or indigestion. [provider]  Active Self  carboxymethylcellulose (REFRESH PLUS) 0.5 % SOLN [id number]  Place 1 drop into both eyes 3 (three) times daily as needed (dry/irritated eyes.). [provider]  Active Self  Cholecalciferol (VITAMIN D-3 PO) [id number] No Take 2,000 Units by mouth in the morning. [provider] Past Week Active Self  clotrimazole (LOTRIMIN) 1 % external solution [id number]  Apply 1 Application topically 2 (two) times daily as needed (toe nail fungus). [provider]  Active Self  Famotidine (PEPCID PO) [id number] No Take 10 mg by mouth in the morning. [provider] 09/01/2023 Active Self  oxyCODONE (ROXICODONE) 5 MG immediate release tablet [id number]  Take 1 tablet (5 mg total) by mouth every 4 (four) hours as needed for severe pain. [address], [place]  Active   pantoprazole (PROTONIX) 40 MG tablet [id number]  Take 1 tablet (40 mg total) by mouth daily for 14 days. Lorin Glass, MD  Active   PREVIDENT 5000 DRY MOUTH 1.1 % GEL dental gel [id number] No Place 1 Application onto teeth in the morning. [provider] Taking Active Self  rosuvastatin (CRESTOR) 20 MG tablet [id number] No TAKE 1 TABLET(20 MG) BY MOUTH DAILY  Patient taking differently: Take 20 mg by mouth at bedtime.   Chilton Si, MD 08/31/2023 Active Self  senna-docusate (SENOKOT-S) 8.6-50 MG tablet [id number]  Take 1 tablet by mouth daily. Lorin Glass, MD  Active   valsartan (  DIOVAN) 160 MG tablet [id number]  Take 1 tablet (160 mg total) by mouth daily. Lorin Glass, MD  Active             Home Care and Equipment/Supplies: Were Home Health Services Ordered?: NA Any new equipment or medical supplies ordered?: Yes Name of Medical supply agency?:  unknown Were you able to get the equipment/medical supplies?: Yes Do you have any questions related to the use of the equipment/supplies?: No  Functional Questionnaire: Do you need assistance with bathing/showering or dressing?: No Do you need assistance with meal preparation?: No Do you need assistance with eating?: No Do you have difficulty maintaining continence: Yes Do you need assistance with getting out of bed/getting out of a chair/moving?: No Do you have difficulty managing or taking your medications?: No  Follow up appointments reviewed: PCP Follow-up appointment confirmed?: Yes Date of PCP follow-up appointment?: 09/20/23 Follow-up Provider: [facility] Follow-up appointment confirmed?: No Reason Specialist Follow-Up Not Confirmed: Patient has Specialist Provider Number and will Call for Appointment Do you need transportation to your follow-up appointment?: No Do you understand care options if your condition(s) worsen?: Yes-patient verbalized understanding    SIGNATURE Karena Addison, LPN [facility] Nurse Health Advisor Direct Dial [phone]

## 2023-09-14 NOTE — Telephone Encounter (Signed)
Pt has a TOC appointment with you on Friday, 09/17/23. Pt asks if there is any way her foley catheter can be removed at that visit. The pt states that the earliest urology appointment she can get is Mid October. Thanks.

## 2023-09-17 ENCOUNTER — Encounter: Payer: Self-pay | Admitting: Family Medicine

## 2023-09-17 ENCOUNTER — Ambulatory Visit: Payer: Medicare PPO | Admitting: Family Medicine

## 2023-09-17 VITALS — BP 120/72 | HR 83 | Temp 98.3°F | Ht 64.0 in | Wt 173.0 lb

## 2023-09-17 DIAGNOSIS — D649 Anemia, unspecified: Secondary | ICD-10-CM

## 2023-09-17 DIAGNOSIS — E871 Hypo-osmolality and hyponatremia: Secondary | ICD-10-CM

## 2023-09-17 LAB — PROTEIN ELECTRO, RANDOM URINE
Albumin ELP, Urine: 8.7 %
Alpha-1-Globulin, U: 4.1 %
Alpha-2-Globulin, U: 20.4 %
Beta Globulin, U: 31.5 %
Gamma Globulin, U: 35.3 %
Total Protein, Urine: 43.7 mg/dL

## 2023-09-17 MED ORDER — SULFAMETHOXAZOLE-TRIMETHOPRIM 800-160 MG PO TABS: 1.0000 | ORAL_TABLET | Freq: Two times a day (BID) | ORAL | 0 refills | Status: DC

## 2023-09-17 MED ORDER — OXYCODONE-ACETAMINOPHEN 5-325 MG PO TABS
1.0000 | ORAL_TABLET | ORAL | 0 refills | Status: AC | PRN
Start: 2023-09-17 — End: 2023-09-22

## 2023-09-17 NOTE — Progress Notes (Signed)
Subjective:    Patient ID: Heather Hoover, female    DOB: 18-May-1946, 77 y.o.   MRN: [id number]  HPI Admit date: 09/01/2023 Discharge date: 09/13/2023   Admitted From: home Discharge disposition: home   Recommendations at discharge:  Post discharge, resume valsartan at 160 mg daily.  Continue to hold carvedilol.  Continue to monitor blood pressure at home.  Follow-up with PCP/cardiologist for further adjustment. Continue aspirin 81 mg twice daily for next 2 weeks for DVT prophylaxis.  Continue Protonix while on it. Call and make appointment with colorectal surgeon at [facility] for treatment of bleeding hemorrhoids Cautious use of pain medicines Follow-up with PCP in 1 week to check sodium level Follow-up with [facility] for voiding trial   Brief narrative: Heather Hoover is a 77 y.o. female with PMH significant for HTN, HLD, osteoarthritis 9/11, underwent right total knee replacement with Dr. Linna Caprice without complication.  Postop, she recovered well and started working with physical therapy.  But she developed some nausea, dizziness with ambulation and was noted to have orthostatic hypotension.  Labs also showed gradual drop in sodium level to as low as 122.  Her symptoms did not improve and hence hospitalist service was consulted on 9/17.   Subjective: Patient was seen and examined this morning. Pleasant elderly Caucasian female.  Propped up in bed.  Feels better.  Wants to go home today.   Assessment and plan: Orthostatic hypotension H/o hypertension Postop, patient's mobility was limited because of orthostatic hypotension which was probably due to blood loss and continuous enough her blood pressure medicines. She was adequately hydrated.  Blood transfusion given. Orthostatic symptoms and blood pressure change improving.   Improving ambulation with PT for the last 2 days. PTA meds- carvedilol 6.25 mg twice daily, valsartan 320 mg daily.  Patient  states valsartan dose was recently increased from 160 mg daily.  Currently both are on hold. Blood pressure is consistently over 140s for the last 48 hours.  At discharge, I would resume valsartan at half dose 160 mg daily.  Continue to hold carvedilol. Continue to monitor blood pressure at home.  Patient follows up with PCP and cardiology Dr. Duke Salvia.   Acute blood loss anemia Bleeding hemorrhoids Baseline hemoglobin of 11, patient had intermittently bleeding hemorrhoids for a long time.   With fracture and surgery, hemoglobin gradually dropped to the lowest of 7.1 on 9/20.  1 unit of PRBC transfusion was given.   Patient also suffered intermittent hemorrhoidal bleeding with the constipation.  Improving now.. Ferritin level is normal ruling out any possible chronic bleeding source. Last colonoscopy was with Dr. Loreta Ave in 2021. For the next 2 weeks, patient to remain on aspirin 81 mg twice daily for DVT prophylaxis. Continue Protonix in the interval. Encouraged to visit colorectal surgeon as an outpatient for hemorrhoid treatment Recent Labs (within last 365 days)           Recent Labs    09/09/23 0716 09/10/23 0335 09/10/23 0336 09/10/23 1019 09/11/23 0345 09/12/23 0328 09/13/23 0327  HGB 7.8*  --  7.1*  --  8.8* 9.2* 9.6*  MCV 97.2  --  91.9  --  93.7 98.7 96.4  VITAMINB12  --   --  297  --   --   --   --   FOLATE  --   --  11.6  --   --   --   --   FERRITIN  --  142  --   --   --   --   --  TIBC  --  224*  --   --   --   --   --   IRON  --  41  --   --   --   --   --   RETICCTPCT  --   --   --  5.6*  --   --   --       Hyponatremia It seems she has normal baseline sodium level. Sodium trend as below.  She had an acute steady drop to the lowest of 121.  Serum osmolality was low at 264.  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 9/20, started on salt tablets 1 g 3 times daily.  Gradually improved sodium level and  has salt tablets were stopped.  For the last 3 days, sodium level is stable.  Follow-up with PCP for further monitoring. Last Labs              Recent Labs  Lab 09/07/23 0322 09/07/23 0849 09/07/23 1455 09/07/23 2058 09/08/23 0305 09/09/23 0716 09/10/23 0336 09/11/23 0345 09/12/23 0328 09/13/23 0327  NA 122* 124* 123* 121* 123* 127* 127* 132* 132* 132*      Acute urinary retention Chronic urinary incontinence Patient states she has chronic urinary urgency incontinence but manageable with pads. 9/19, she was noted to have distended abdomen and subsequently drained 1.1 L of urine.  Overnight, she required another and out catheterizing. Foley catheter was inserted.  I would maintain Foley catheter at discharge.  Needs urology follow-up as an outpatient for voiding trial   S/p Right TKR Osteoarthritis Followed by orthopedics. For pain management, continue scheduled Tylenol and PRN opioids   Hyperlipidemia Continue Crestor   Impaired mobility Seen by PT.  Home with PT recommended   Patient is here today for follow-up.  She has a Foley catheter in place.  She complains of bladder spasms.  She feels a discomfort in her bladder.  She feels like the Foley is irritating it.  She denies any fevers or chills.  She denies any shortness of breath.  She is walking and with a walker.  She is in a fair amount of pain.  She denies seeing any more blood per rectum.  She is not taking any iron.  Her blood pressure today is excellent.  She is still on 160 mg a day of valsartan.   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/2010   echo   Osteopenia 12/2018   T score -1.6 FRAX 10% / 1.5%   Past Surgical History:  Procedure Laterality Date   ABDOMINAL  HYSTERECTOMY  02/07/1981   RSO, left one ovary   APPENDECTOMY     BREAST SURGERY     CARDIAC CATHETERIZATION     when in 3rd grade and age 52   CARPAL TUNNEL RELEASE Left 02/06/2021   Procedure: LEFT CARPAL TUNNEL RELEASE;  Surgeon: Cindee Salt, MD;  Location: [facility];  Service: Orthopedics;  Laterality: Left;  IV REGIONAL FOREARM BLOCK 45 MINUTES   CARPAL TUNNEL RELEASE Right 06/03/2021   Procedure: RIGHT CARPAL TUNNEL RELEASE;  Surgeon: Cindee Salt, MD;  Location: [facility];  Service: Orthopedics;  Laterality: Right;  45 MIN   COSMETIC SURGERY     forehead plastic surgery     MOH's surgery   FRACTURE SURGERY     right arm   KNEE ARTHROPLASTY Right 09/01/2023   Procedure: COMPUTER ASSISTED TOTAL KNEE ARTHROPLASTY;  Surgeon: Samson Frederic, MD;  Location: [facility];  Service: Orthopedics;  Laterality: Right;  160   KNEE ARTHROSCOPY     right   Current Outpatient Medications on File Prior to Visit  Medication Sig Dispense Refill   acetaminophen (TYLENOL) 325 MG tablet Take 650 mg by mouth every 6 (six) hours as needed (pain.).     alendronate (FOSAMAX) 70 MG tablet Take 1 tablet (70 mg total) by mouth every 7 (seven) days. Take with a full glass of water on an empty stomach. (Patient taking differently: Take 70 mg by mouth every Thursday. Take with a full glass of water on an empty stomach.) 4 tablet 11   aspirin (ASPIRIN CHILDRENS) 81 MG chewable tablet Chew 1 tablet (81 mg total) by mouth 2 (two) times daily with a meal for 14 days. 28 tablet 0   bismuth subsalicylate (PEPTO BISMOL) 262 MG chewable tablet Chew 262-524 mg by mouth 4 (four) times daily as needed for diarrhea or loose stools or indigestion.     bismuth subsalicylate (PEPTO BISMOL) 262 MG/15ML suspension Take 30 mLs by mouth every 6 (six) hours as needed for diarrhea or loose stools or indigestion.     carboxymethylcellulose (REFRESH PLUS) 0.5 % SOLN Place 1 drop into both eyes 3 (three) times  daily as needed (dry/irritated eyes.).     Cholecalciferol (VITAMIN D-3 PO) Take 2,000 Units by mouth in the morning.     clotrimazole (LOTRIMIN) 1 % external solution Apply 1 Application topically 2 (two) times daily as needed (toe nail fungus).     Famotidine (PEPCID PO) Take 10 mg by mouth in the morning.     oxyCODONE (ROXICODONE) 5 MG immediate release tablet Take 1 tablet (5 mg total) by mouth every 4 (four) hours as needed for severe pain. 42 tablet 0   pantoprazole (PROTONIX) 40 MG tablet Take 1 tablet (40 mg total) by mouth daily for 14 days. 14 tablet 0   PREVIDENT 5000 DRY MOUTH 1.1 % GEL dental gel Place 1 Application onto teeth in the morning.     rosuvastatin (CRESTOR) 20 MG tablet TAKE 1 TABLET(20 MG) BY MOUTH DAILY (Patient taking differently: Take 20 mg by mouth at bedtime.) 90 tablet 3   senna-docusate (SENOKOT-S) 8.6-50 MG tablet Take 1 tablet by mouth daily.     valsartan (DIOVAN) 160 MG tablet Take 1 tablet (160 mg total) by mouth daily. 30 tablet 0   No current facility-administered medications on file prior to visit.   Allergies  Allergen Reactions   Augmentin [Amoxicillin-Pot Clavulanate] Diarrhea    Did it involve swelling of the face/tongue/throat, SOB, or low BP? yes Did it involve sudden or severe rash/hives, skin peeling, or any reaction on the inside of your mouth or nose? no Did you need to seek medical attention at a hospital or doctor's office? no When did it last happen?      2000 If all above answers are "NO", may proceed with cephalosporin use.    Niacin-Lovastatin Er     Flushing,itching,syncope   Kenalog [Triamcinolone] Palpitations   Social History   Socioeconomic History   Marital status: Married    Spouse name:  Not on file   Number of children: Not on file   Years of education: Not on file   Highest education level: Bachelor's degree (e.g., BA, AB, BS)  Occupational History   Not on file  Tobacco Use   Smoking status: Never   Smokeless  tobacco: Never  Vaping Use   Vaping status: Never Used  Substance and Sexual Activity   Alcohol use: Yes    Comment: Occas   Drug use: No   Sexual activity: Not Currently    Birth control/protection: Post-menopausal    Comment: 1st intercourse 4 yo-1 partner  Other Topics Concern   Not on file  Social History Narrative   Not on file   Social Determinants of Health   Financial Resource Strain: Low Risk  (07/18/2023)   Overall Financial Resource Strain (CARDIA)    Difficulty of Paying Living Expenses: Not hard at all  Food Insecurity: No Food Insecurity (09/01/2023)   Hunger Vital Sign    Worried About Running Out of Food in the Last Year: Never true    Ran Out of Food in the Last Year: Never true  Transportation Needs: No Transportation Needs (09/01/2023)   PRAPARE - Administrator, Civil Service (Medical): No    Lack of Transportation (Non-Medical): No  Physical Activity: Insufficiently Active (07/18/2023)   Exercise Vital Sign    Days of Exercise per Week: 3 days    Minutes of Exercise per Session: 20 min  Stress: No Stress Concern Present (07/18/2023)   [employer] of Occupational Health - Occupational Stress Questionnaire    Feeling of Stress : Only a little  Social Connections: Moderately Integrated (07/18/2023)   Social Connection and Isolation Panel [NHANES]    Frequency of Communication with Friends and Family: Once a week    Frequency of Social Gatherings with Friends and Family: Twice a week    Attends Religious Services: Never    Database administrator or Organizations: Yes    Attends Engineer, structural: More than 4 times per year    Marital Status: Married  Catering manager Violence: Not At Risk (09/01/2023)   Humiliation, Afraid, Rape, and Kick questionnaire    Fear of Current or Ex-Partner: No    Emotionally Abused: No    Physically Abused: No    Sexually Abused: No   Family History  Problem Relation Age of Onset   Hypertension  Mother    Breast cancer Mother 40       Breast    Cancer Mother        uterine thye think   Cancer Father 83       prostate metastized to bone   Hypertension Father    Arthritis Father    Hypertension Sister    Breast cancer Sister 69   Cancer Sister 40       uterine   Heart failure Sister    Muscular dystrophy Brother       Review of Systems  All other systems reviewed and are negative.      Objective:   Physical Exam Vitals reviewed.  Constitutional:      General: She is not in acute distress.    Appearance: She is well-developed. She is not diaphoretic.  HENT:     Head: Normocephalic and atraumatic.     Right Ear: External ear normal.     Left Ear: External ear normal.     Nose: Nose normal.     Mouth/Throat:  Pharynx: No oropharyngeal exudate.  Eyes:     General: No scleral icterus.       Right eye: No discharge.        Left eye: No discharge.     Conjunctiva/sclera: Conjunctivae normal.     Pupils: Pupils are equal, round, and reactive to light.  Neck:     Thyroid: No thyromegaly.     Vascular: No JVD.     Trachea: No tracheal deviation.  Cardiovascular:     Rate and Rhythm: Normal rate and regular rhythm.     Heart sounds: Murmur heard.     No friction rub. No gallop.  Pulmonary:     Effort: Pulmonary effort is normal. No respiratory distress.     Breath sounds: Normal breath sounds. No stridor. No wheezing or rales.  Chest:     Chest wall: No tenderness.  Abdominal:     General: Bowel sounds are normal. There is no distension.     Palpations: Abdomen is soft. There is no mass.     Tenderness: There is no abdominal tenderness. There is no guarding or rebound.  Musculoskeletal:        General: No tenderness. Normal range of motion.     Cervical back: Normal range of motion and neck supple.  Lymphadenopathy:     Cervical: No cervical adenopathy.  Skin:    General: Skin is warm.     Coloration: Skin is not pale.     Findings: No erythema or  rash.  Neurological:     Mental Status: She is alert and oriented to person, place, and time.     Cranial Nerves: No cranial nerve deficit.     Motor: No abnormal muscle tone.     Coordination: Coordination normal.     Deep Tendon Reflexes: Reflexes are normal and symmetric.  Psychiatric:        Behavior: Behavior normal.        Thought Content: Thought content normal.        Judgment: Judgment normal.    Right knee incision looks clean dry and intact.  There is no evidence of cellulitis.  Lungs are clear to auscultation bilaterally.  The patient does have a Foley catheter in place.       Assessment & Plan:  Hyponatremia - Plan: CBC with Differential/Platelet, COMPLETE METABOLIC PANEL WITH GFR  Postoperative anemia - Plan: CBC with Differential/Platelet, COMPLETE METABOLIC PANEL WITH GFR Regarding her anemia, I recommended that she add iron sulfate 325 mg daily.  I will recheck a CBC today.  Regarding her hyponatremia, I believe that this was due to her postoperative anemia and hypotension.  Hopefully now that her blood pressure is stable, the SIADH will have normalized.  I will recheck sodium level.  If sodium is still low, we will institute a fluid restriction.  Regarding the urinary retention I recommended leaving the Foley in place until Monday in case there is any complications.  She can come back here and we will do the following on Monday.  She can use Azo for bladder spasms over the weekend.  We decided together to go ahead and treat for possible urinary tract infection with Bactrim given the fact the urine from the bag is likely contaminated and would not give an accurate urinalysis.

## 2023-09-18 LAB — COMPLETE METABOLIC PANEL WITH GFR
AG Ratio: 1.5 (calc) (ref 1.0–2.5)
ALT: 15 U/L (ref 6–29)
AST: 20 U/L (ref 10–35)
Albumin: 3.8 g/dL (ref 3.6–5.1)
Alkaline phosphatase (APISO): 88 U/L (ref 37–153)
BUN/Creatinine Ratio: 23 (calc) — ABNORMAL HIGH (ref 6–22)
BUN: 10 mg/dL (ref 7–25)
CO2: 29 mmol/L (ref 20–32)
Calcium: 9.2 mg/dL (ref 8.6–10.4)
Chloride: 95 mmol/L — ABNORMAL LOW (ref 98–110)
Creat: 0.43 mg/dL — ABNORMAL LOW (ref 0.60–1.00)
Globulin: 2.5 g/dL (ref 1.9–3.7)
Glucose, Bld: 102 mg/dL — ABNORMAL HIGH (ref 65–99)
Potassium: 4.5 mmol/L (ref 3.5–5.3)
Sodium: 131 mmol/L — ABNORMAL LOW (ref 135–146)
Total Bilirubin: 0.7 mg/dL (ref 0.2–1.2)
Total Protein: 6.3 g/dL (ref 6.1–8.1)
eGFR: 101 mL/min/{1.73_m2} (ref >=60)

## 2023-09-18 LAB — CBC WITH DIFFERENTIAL/PLATELET
Absolute Monocytes: 569 {cells}/uL (ref 200–950)
Basophils Absolute: 58 {cells}/uL (ref 0–200)
Basophils Relative: 0.8 %
Eosinophils Absolute: 101 {cells}/uL (ref 15–500)
Eosinophils Relative: 1.4 %
HCT: 35.3 % (ref 35.0–45.0)
Hemoglobin: 11.2 g/dL — ABNORMAL LOW (ref 11.7–15.5)
Lymphs Abs: 929 {cells}/uL (ref 850–3900)
MCH: 30.3 pg (ref 27.0–33.0)
MCHC: 31.7 g/dL — ABNORMAL LOW (ref 32.0–36.0)
MCV: 95.4 fL (ref 80.0–100.0)
MPV: 8.7 fL (ref 7.5–12.5)
Monocytes Relative: 7.9 %
Neutro Abs: 5544 {cells}/uL (ref 1500–7800)
Neutrophils Relative %: 77 %
Platelets: 419 10*3/uL — ABNORMAL HIGH (ref 140–400)
RBC: 3.7 10*6/uL — ABNORMAL LOW (ref 3.80–5.10)
RDW: 13.4 % (ref 11.0–15.0)
Total Lymphocyte: 12.9 %
WBC: 7.2 10*3/uL (ref 3.8–10.8)

## 2023-09-20 DIAGNOSIS — Z471 Aftercare following joint replacement surgery: Secondary | ICD-10-CM | POA: Diagnosis not present

## 2023-09-20 DIAGNOSIS — Z96651 Presence of right artificial knee joint: Secondary | ICD-10-CM | POA: Diagnosis not present

## 2023-09-21 ENCOUNTER — Encounter (HOSPITAL_BASED_OUTPATIENT_CLINIC_OR_DEPARTMENT_OTHER): Payer: Self-pay | Admitting: Obstetrics & Gynecology

## 2023-09-21 ENCOUNTER — Encounter: Payer: Self-pay | Admitting: Family Medicine

## 2023-09-21 ENCOUNTER — Ambulatory Visit: Payer: Medicare PPO | Admitting: Family Medicine

## 2023-09-21 VITALS — BP 110/62 | HR 89 | Temp 98.1°F | Ht 64.5 in | Wt 173.0 lb

## 2023-09-21 DIAGNOSIS — R339 Retention of urine, unspecified: Secondary | ICD-10-CM | POA: Diagnosis not present

## 2023-09-21 NOTE — Progress Notes (Addendum)
Subjective:    Patient ID: Heather Hoover, female    DOB: 09/23/1946, 77 y.o.   MRN: [id number]  HPI Admit date: 09/01/2023 Discharge date: 09/13/2023   Admitted From: home Discharge disposition: home   Recommendations at discharge:  Post discharge, resume valsartan at 160 mg daily.  Continue to hold carvedilol.  Continue to monitor blood pressure at home.  Follow-up with PCP/cardiologist for further adjustment. Continue aspirin 81 mg twice daily for next 2 weeks for DVT prophylaxis.  Continue Protonix while on it. Call and make appointment with colorectal surgeon at [facility] for treatment of bleeding hemorrhoids Cautious use of pain medicines Follow-up with PCP in 1 week to check sodium level Follow-up with [facility] for voiding trial   Brief narrative: Heather Hoover is a 77 y.o. female with PMH significant for HTN, HLD, osteoarthritis 9/11, underwent right total knee replacement with Dr. Linna Caprice without complication.  Postop, she recovered well and started working with physical therapy.  But she developed some nausea, dizziness with ambulation and was noted to have orthostatic hypotension.  Labs also showed gradual drop in sodium level to as low as 122.  Her symptoms did not improve and hence hospitalist service was consulted on 9/17.   Subjective: Patient was seen and examined this morning. Pleasant elderly Caucasian female.  Propped up in bed.  Feels better.  Wants to go home today.   Assessment and plan: Orthostatic hypotension H/o hypertension Postop, patient's mobility was limited because of orthostatic hypotension which was probably due to blood loss and continuous enough her blood pressure medicines. She was adequately hydrated.  Blood transfusion given. Orthostatic symptoms and blood pressure change improving.   Improving ambulation with PT for the last 2 days. PTA meds- carvedilol 6.25 mg twice daily, valsartan 320 mg daily.  Patient  states valsartan dose was recently increased from 160 mg daily.  Currently both are on hold. Blood pressure is consistently over 140s for the last 48 hours.  At discharge, I would resume valsartan at half dose 160 mg daily.  Continue to hold carvedilol. Continue to monitor blood pressure at home.  Patient follows up with PCP and cardiology Dr. Duke Salvia.   Acute blood loss anemia Bleeding hemorrhoids Baseline hemoglobin of 11, patient had intermittently bleeding hemorrhoids for a long time.   With fracture and surgery, hemoglobin gradually dropped to the lowest of 7.1 on 9/20.  1 unit of PRBC transfusion was given.   Patient also suffered intermittent hemorrhoidal bleeding with the constipation.  Improving now.. Ferritin level is normal ruling out any possible chronic bleeding source. Last colonoscopy was with Dr. Loreta Ave in 2021. For the next 2 weeks, patient to remain on aspirin 81 mg twice daily for DVT prophylaxis. Continue Protonix in the interval. Encouraged to visit colorectal surgeon as an outpatient for hemorrhoid treatment Recent Labs (within last 365 days)           Recent Labs    09/09/23 0716 09/10/23 0335 09/10/23 0336 09/10/23 1019 09/11/23 0345 09/12/23 0328 09/13/23 0327  HGB 7.8*  --  7.1*  --  8.8* 9.2* 9.6*  MCV 97.2  --  91.9  --  93.7 98.7 96.4  VITAMINB12  --   --  297  --   --   --   --   FOLATE  --   --  11.6  --   --   --   --   FERRITIN  --  142  --   --   --   --   --  TIBC  --  224*  --   --   --   --   --   IRON  --  41  --   --   --   --   --   RETICCTPCT  --   --   --  5.6*  --   --   --       Hyponatremia It seems she has normal baseline sodium level. Sodium trend as below.  She had an acute steady drop to the lowest of 121.  Serum osmolality was low at 264.  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 9/20, started on salt tablets 1 g 3 times daily.  Gradually improved sodium level and  has salt tablets were stopped.  For the last 3 days, sodium level is stable.  Follow-up with PCP for further monitoring. Last Labs              Recent Labs  Lab 09/07/23 0322 09/07/23 0849 09/07/23 1455 09/07/23 2058 09/08/23 0305 09/09/23 0716 09/10/23 0336 09/11/23 0345 09/12/23 0328 09/13/23 0327  NA 122* 124* 123* 121* 123* 127* 127* 132* 132* 132*      Acute urinary retention Chronic urinary incontinence Patient states she has chronic urinary urgency incontinence but manageable with pads. 9/19, she was noted to have distended abdomen and subsequently drained 1.1 L of urine.  Overnight, she required another and out catheterizing. Foley catheter was inserted.  I would maintain Foley catheter at discharge.  Needs urology follow-up as an outpatient for voiding trial   S/p Right TKR Osteoarthritis Followed by orthopedics. For pain management, continue scheduled Tylenol and PRN opioids   Hyperlipidemia Continue Crestor   Impaired mobility Seen by PT.  Home with PT recommended   Patient is here today for follow-up.  She has a Foley catheter in place.  She complains of bladder spasms.  She feels a discomfort in her bladder.  She feels like the Foley is irritating it.  She denies any fevers or chills.  She denies any shortness of breath.  She is walking and with a walker.  She is in a fair amount of pain.  She denies seeing any more blood per rectum.  She is not taking any iron.  Her blood pressure today is excellent.  She is still on 160 mg a day of valsartan.  At that time, my plan was: Regarding her anemia, I recommended that she add iron sulfate 325 mg daily.  I will recheck a CBC today.  Regarding her hyponatremia, I believe that this was due to her postoperative anemia and hypotension.  Hopefully now that her blood pressure is stable, the SIADH will have normalized.  I will recheck sodium level.  If sodium is still low, we will institute a fluid restriction.  Regarding the  urinary retention I recommended leaving the Foley in place until Monday in case there is any complications.  She can come back here and we will do the following on Monday.  She can use Azo for bladder spasms over the weekend.  We decided together to go ahead and treat for possible urinary tract infection with Bactrim given the fact the urine from the bag is likely contaminated and would not give an accurate urinalysis.  09/21/23 Patient feels the urge to pee.  She is requesting that I remove the catheter.  She states that she is miserable with a catheter in place.  Otherwise she  is doing well.  Her sodium was low at 131.  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/2010   echo   Osteopenia 12/2018   T score -1.6 FRAX 10% / 1.5%   Past Surgical History:  Procedure Laterality Date   ABDOMINAL HYSTERECTOMY  02/07/1981   RSO, left one ovary   APPENDECTOMY     BREAST SURGERY     CARDIAC CATHETERIZATION     when in 3rd grade and age 58   CARPAL TUNNEL RELEASE Left 02/06/2021   Procedure: LEFT CARPAL TUNNEL RELEASE;  Surgeon: Cindee Salt, MD;  Location: [facility];  Service: Orthopedics;  Laterality: Left;  IV REGIONAL FOREARM BLOCK 45 MINUTES   CARPAL TUNNEL RELEASE Right 06/03/2021   Procedure: RIGHT CARPAL TUNNEL RELEASE;  Surgeon: Cindee Salt, MD;  Location: [facility];  Service: Orthopedics;  Laterality: Right;  45 MIN   COSMETIC SURGERY     forehead plastic surgery     MOH's surgery   FRACTURE SURGERY     right arm   KNEE ARTHROPLASTY Right 09/01/2023   Procedure: COMPUTER ASSISTED TOTAL KNEE ARTHROPLASTY;  Surgeon: Samson Frederic, MD;  Location: [facility];  Service:  Orthopedics;  Laterality: Right;  160   KNEE ARTHROSCOPY     right   Current Outpatient Medications on File Prior to Visit  Medication Sig Dispense Refill   acetaminophen (TYLENOL) 325 MG tablet Take 650 mg by mouth every 6 (six) hours as needed (pain.).     alendronate (FOSAMAX) 70 MG tablet Take 1 tablet (70 mg total) by mouth every 7 (seven) days. Take with a full glass of water on an empty stomach. (Patient taking differently: Take 70 mg by mouth every Thursday. Take with a full glass of water on an empty stomach.) 4 tablet 11   aspirin (ASPIRIN CHILDRENS) 81 MG chewable tablet Chew 1 tablet (81 mg total) by mouth 2 (two) times daily with a meal for 14 days. 28 tablet 0   bismuth subsalicylate (PEPTO BISMOL) 262 MG chewable tablet Chew 262-524 mg by mouth 4 (four) times daily as needed for diarrhea or loose stools or indigestion.     bismuth subsalicylate (PEPTO BISMOL) 262 MG/15ML suspension Take 30 mLs by mouth every 6 (six) hours as needed for diarrhea or loose stools or indigestion.     carboxymethylcellulose (REFRESH PLUS) 0.5 % SOLN Place 1 drop into both eyes 3 (three) times daily as needed (dry/irritated eyes.).     Cholecalciferol (VITAMIN D-3 PO) Take 2,000 Units by mouth in the morning.     clotrimazole (LOTRIMIN) 1 % external solution Apply 1 Application topically 2 (two) times daily as needed (toe nail fungus).     Famotidine (PEPCID PO) Take 10 mg by mouth in the morning.     oxyCODONE (ROXICODONE) 5 MG immediate release tablet Take 1 tablet (5 mg total) by mouth every 4 (four) hours as needed for severe pain. 42 tablet 0   oxyCODONE-acetaminophen (PERCOCET/ROXICET) 5-325 MG tablet Take 1 tablet by mouth every 4 (four) hours as needed for up to 5 days. 30 tablet  0   pantoprazole (PROTONIX) 40 MG tablet Take 1 tablet (40 mg total) by mouth daily for 14 days. 14 tablet 0   PREVIDENT 5000 DRY MOUTH 1.1 % GEL dental gel Place 1 Application onto teeth in the morning.     rosuvastatin  (CRESTOR) 20 MG tablet TAKE 1 TABLET(20 MG) BY MOUTH DAILY (Patient taking differently: Take 20 mg by mouth at bedtime.) 90 tablet 3   senna-docusate (SENOKOT-S) 8.6-50 MG tablet Take 1 tablet by mouth daily.     sulfamethoxazole-trimethoprim (BACTRIM DS) 800-160 MG tablet Take 1 tablet by mouth 2 (two) times daily. 10 tablet 0   valsartan (DIOVAN) 160 MG tablet Take 1 tablet (160 mg total) by mouth daily. 30 tablet 0   No current facility-administered medications on file prior to visit.   Allergies  Allergen Reactions   Augmentin [Amoxicillin-Pot Clavulanate] Diarrhea    Did it involve swelling of the face/tongue/throat, SOB, or low BP? yes Did it involve sudden or severe rash/hives, skin peeling, or any reaction on the inside of your mouth or nose? no Did you need to seek medical attention at a hospital or doctor's office? no When did it last happen?      2000 If all above answers are "NO", may proceed with cephalosporin use.    Niacin-Lovastatin Er     Flushing,itching,syncope   Kenalog [Triamcinolone] Palpitations   Social History   Socioeconomic History   Marital status: Married    Spouse name: Not on file   Number of children: Not on file   Years of education: Not on file   Highest education level: Bachelor's degree (e.g., BA, AB, BS)  Occupational History   Not on file  Tobacco Use   Smoking status: Never   Smokeless tobacco: Never  Vaping Use   Vaping status: Never Used  Substance and Sexual Activity   Alcohol use: Yes    Comment: Occas   Drug use: No   Sexual activity: Not Currently    Birth control/protection: Post-menopausal    Comment: 1st intercourse 64 yo-1 partner  Other Topics Concern   Not on file  Social History Narrative   Not on file   Social Determinants of Health   Financial Resource Strain: Low Risk  (07/18/2023)   Overall Financial Resource Strain (CARDIA)    Difficulty of Paying Living Expenses: Not hard at all  Food Insecurity: No Food  Insecurity (09/01/2023)   Hunger Vital Sign    Worried About Running Out of Food in the Last Year: Never true    Ran Out of Food in the Last Year: Never true  Transportation Needs: No Transportation Needs (09/01/2023)   PRAPARE - Administrator, Civil Service (Medical): No    Lack of Transportation (Non-Medical): No  Physical Activity: Insufficiently Active (07/18/2023)   Exercise Vital Sign    Days of Exercise per Week: 3 days    Minutes of Exercise per Session: 20 min  Stress: No Stress Concern Present (07/18/2023)   [employer] of Occupational Health - Occupational Stress Questionnaire    Feeling of Stress : Only a little  Social Connections: Moderately Integrated (07/18/2023)   Social Connection and Isolation Panel [NHANES]    Frequency of Communication with Friends and Family: Once a week    Frequency of Social Gatherings with Friends and Family: Twice a week    Attends Religious Services: Never    Database administrator or Organizations: Yes    Attends Banker  Meetings: More than 4 times per year    Marital Status: Married  Catering manager Violence: Not At Risk (09/01/2023)   Humiliation, Afraid, Rape, and Kick questionnaire    Fear of Current or Ex-Partner: No    Emotionally Abused: No    Physically Abused: No    Sexually Abused: No   Family History  Problem Relation Age of Onset   Hypertension Mother    Breast cancer Mother 82       Breast    Cancer Mother        uterine thye think   Cancer Father 68       prostate metastized to bone   Hypertension Father    Arthritis Father    Hypertension Sister    Breast cancer Sister 26   Cancer Sister 92       uterine   Heart failure Sister    Muscular dystrophy Brother       Review of Systems  All other systems reviewed and are negative.      Objective:   Physical Exam Vitals reviewed.  Constitutional:      General: She is not in acute distress.    Appearance: She is well-developed.  She is not diaphoretic.  HENT:     Head: Normocephalic and atraumatic.     Right Ear: External ear normal.     Left Ear: External ear normal.     Nose: Nose normal.     Mouth/Throat:     Pharynx: No oropharyngeal exudate.  Eyes:     General: No scleral icterus.       Right eye: No discharge.        Left eye: No discharge.     Conjunctiva/sclera: Conjunctivae normal.     Pupils: Pupils are equal, round, and reactive to light.  Neck:     Thyroid: No thyromegaly.     Vascular: No JVD.     Trachea: No tracheal deviation.  Cardiovascular:     Rate and Rhythm: Normal rate and regular rhythm.     Heart sounds: Murmur heard.     No friction rub. No gallop.  Pulmonary:     Effort: Pulmonary effort is normal. No respiratory distress.     Breath sounds: Normal breath sounds. No stridor. No wheezing or rales.  Chest:     Chest wall: No tenderness.  Abdominal:     General: Bowel sounds are normal. There is no distension.     Palpations: Abdomen is soft. There is no mass.     Tenderness: There is no abdominal tenderness. There is no guarding or rebound.  Musculoskeletal:        General: No tenderness. Normal range of motion.     Cervical back: Normal range of motion and neck supple.  Lymphadenopathy:     Cervical: No cervical adenopathy.  Skin:    General: Skin is warm.     Coloration: Skin is not pale.     Findings: No erythema or rash.  Neurological:     Mental Status: She is alert and oriented to person, place, and time.     Cranial Nerves: No cranial nerve deficit.     Motor: No abnormal muscle tone.     Coordination: Coordination normal.     Deep Tendon Reflexes: Reflexes are normal and symmetric.  Psychiatric:        Behavior: Behavior normal.        Thought Content: Thought content normal.  Judgment: Judgment normal.    Right knee incision looks clean dry and intact.  There is no evidence of cellulitis.  Lungs are clear to auscultation bilaterally.  The patient does  have a Foley catheter in place.       Assessment & Plan:  Urinary retention I removed the Foley catheter as the patient requested.  I have told the patient that if she has not passed urine by lunchtime, she is to come back so that we can replace the Foley and contact urology.  If the patient is able to urinate on her own, she will pass the voiding trial and no longer requires a Foley catheter  Patient called back to the office at 430 unable to void.  Urology office had closed and switched to on call line.  We replaced foley catheter and drained 250 cc of clear urine.  Unfortunately, we only had a red rocket on site.  This was left in placed and taped to the leg to try to maintain position.  We have ordered a 22F catheter for tomorrow and will replace foley at that time with proper catheter.  We also discussed in and out cath technique and supplies were given to allow this in the event the foley becomes dislodged.

## 2023-09-22 MED ORDER — SULFAMETHOXAZOLE-TRIMETHOPRIM 800-160 MG PO TABS
1.0000 | ORAL_TABLET | Freq: Two times a day (BID) | ORAL | 0 refills | Status: DC
Start: 2023-09-22 — End: 2023-12-29

## 2023-09-22 NOTE — Addendum Note (Signed)
Addended by: Venia Carbon K on: 09/22/2023 11:24 AM   Modules accepted: Orders

## 2023-09-23 ENCOUNTER — Encounter: Payer: Self-pay | Admitting: Family Medicine

## 2023-09-24 DIAGNOSIS — M25561 Pain in right knee: Secondary | ICD-10-CM | POA: Diagnosis not present

## 2023-09-24 DIAGNOSIS — M25661 Stiffness of right knee, not elsewhere classified: Secondary | ICD-10-CM | POA: Diagnosis not present

## 2023-09-27 ENCOUNTER — Encounter: Payer: Self-pay | Admitting: Family Medicine

## 2023-09-27 DIAGNOSIS — R2232 Localized swelling, mass and lump, left upper limb: Secondary | ICD-10-CM | POA: Diagnosis not present

## 2023-09-30 DIAGNOSIS — M25661 Stiffness of right knee, not elsewhere classified: Secondary | ICD-10-CM | POA: Diagnosis not present

## 2023-09-30 DIAGNOSIS — M25561 Pain in right knee: Secondary | ICD-10-CM | POA: Diagnosis not present

## 2023-10-04 ENCOUNTER — Encounter: Payer: Self-pay | Admitting: Pharmacist Clinician (PhC)/ Clinical Pharmacy Specialist

## 2023-10-05 DIAGNOSIS — M25561 Pain in right knee: Secondary | ICD-10-CM | POA: Diagnosis not present

## 2023-10-05 DIAGNOSIS — M25661 Stiffness of right knee, not elsewhere classified: Secondary | ICD-10-CM | POA: Diagnosis not present

## 2023-10-06 DIAGNOSIS — R3915 Urgency of urination: Secondary | ICD-10-CM | POA: Diagnosis not present

## 2023-10-06 DIAGNOSIS — R338 Other retention of urine: Secondary | ICD-10-CM | POA: Diagnosis not present

## 2023-10-07 DIAGNOSIS — M25661 Stiffness of right knee, not elsewhere classified: Secondary | ICD-10-CM | POA: Diagnosis not present

## 2023-10-07 DIAGNOSIS — M25561 Pain in right knee: Secondary | ICD-10-CM | POA: Diagnosis not present

## 2023-10-11 ENCOUNTER — Ambulatory Visit: Payer: Medicare PPO | Attending: Cardiology | Admitting: Pharmacist Clinician (PhC)/ Clinical Pharmacy Specialist

## 2023-10-11 ENCOUNTER — Encounter: Payer: Self-pay | Admitting: Pharmacist Clinician (PhC)/ Clinical Pharmacy Specialist

## 2023-10-11 VITALS — BP 130/85 | HR 84

## 2023-10-11 DIAGNOSIS — Z96651 Presence of right artificial knee joint: Secondary | ICD-10-CM | POA: Diagnosis not present

## 2023-10-11 DIAGNOSIS — Z471 Aftercare following joint replacement surgery: Secondary | ICD-10-CM | POA: Diagnosis not present

## 2023-10-11 DIAGNOSIS — I119 Hypertensive heart disease without heart failure: Secondary | ICD-10-CM

## 2023-10-11 MED ORDER — CARVEDILOL 3.125 MG PO TABS: 3.1250 mg | ORAL_TABLET | Freq: Two times a day (BID) | ORAL | 3 refills | Status: DC

## 2023-10-11 NOTE — Patient Instructions (Signed)
Follow up appointment: Wednesday January 8 at 1:30 pm  Take your BP meds as follows:  Re-start carvedilol at 3.125 mg twice daily (1/2 of the 6.25 mg tablets)  Check your blood pressure at home daily (if able) and keep record of the readings.  Hypertension "High blood pressure"  Hypertension is often called "The Silent Killer." It rarely causes symptoms until it is extremely  high or has done damage to other organs in the body. For this reason, you should have your  blood pressure checked regularly by your physician. We will check your blood pressure  every time you see a provider at one of our offices.   Your blood pressure reading consists of two numbers. Ideally, blood pressure should be  below 120/80. The first ("top") number is called the systolic pressure. It measures the  pressure in your arteries as your heart beats. The second ("bottom") number is called the diastolic pressure. It measures the pressure in your arteries as the heart relaxes between beats.  The benefits of getting your blood pressure under control are enormous. A 10-point  reduction in systolic blood pressure can reduce your risk of stroke by 27% and heart failure by 28%  Your blood pressure goal is 130/80  To check your pressure at home you will need to:  1. Sit up in a chair, with feet flat on the floor and back supported. Do not cross your ankles or legs. 2. Rest your left arm so that the cuff is about heart level. If the cuff goes on your upper arm,  then just relax the arm on the table, arm of the chair or your lap. If you have a wrist cuff, we  suggest relaxing your wrist against your chest (think of it as Pledging the Flag with the  wrong arm).  3. Place the cuff snugly around your arm, about 1 inch above the crook of your elbow. The  cords should be inside the groove of your elbow.  4. Sit quietly, with the cuff in place, for about 5 minutes. After that 5 minutes press the power  button to start a  reading. 5. Do not talk or move while the reading is taking place.  6. Record your readings on a sheet of paper. Although most cuffs have a memory, it is often  easier to see a pattern developing when the numbers are all in front of you.  7. You can repeat the reading after 1-3 minutes if it is recommended  Make sure your bladder is empty and you have not had caffeine or tobacco within the last 30 min  Always bring your blood pressure log with you to your appointments. If you have not brought your monitor in to be double checked for accuracy, please bring it to your next appointment.  You can find a list of quality blood pressure cuffs at validatebp.org

## 2023-10-11 NOTE — Assessment & Plan Note (Signed)
Assessment: BP is uncontrolled in office BP 130/85 mmHg;  above the goal (<130/80). Had stopped carvedilol and cut valsartan to 160 mg at hospitalization - 2/2 OH Tolerates valsartan well without any side effects Denies SOB, palpitation, chest pain, headaches,or swelling Reiterated the importance of regular exercise and low salt diet   Plan:  Start taking carvedilol 3.125 mg (1/2 of the 6.25 mg tablet) twice daily Continue taking valsartan 160 mg once daily Patient to keep record of BP readings with heart rate and report to [place] at the next visit Patient to follow up with PharmD in 3 months  Labs ordered today:  none

## 2023-10-11 NOTE — Telephone Encounter (Signed)
Pt seen in office 10/21.

## 2023-10-11 NOTE — Progress Notes (Signed)
Office Visit    Patient Name: Heather Hoover Date of Encounter: 10/11/2023  Primary Care Provider:  Donita Brooks, MD Primary Cardiologist:  Chilton Si, MD  Chief Complaint    Hypertension - Advanced hypertension clinic  Past Medical History   HLD 7/24 LDL 59 on rosuvastatin 20   Congenital pulmonic stenosis Stable, consistent gradient over past 6 years    Allergies  Allergen Reactions   Augmentin [Amoxicillin-Pot Clavulanate] Diarrhea    Did it involve swelling of the face/tongue/throat, SOB, or low BP? yes Did it involve sudden or severe rash/hives, skin peeling, or any reaction on the inside of your mouth or nose? no Did you need to seek medical attention at a hospital or doctor's office? no When did it last happen?      2000 If all above answers are "NO", may proceed with cephalosporin use.    Niacin-Lovastatin Er     Flushing,itching,syncope   Kenalog [Triamcinolone] Palpitations    History of Present Illness    Heather Hoover is a 77 y.o. female patient of Dr. Duke Salvia, in the office today for hypertension follow up.  She has been seen multiple times in clinic, often with elevated readings, but noting home readings WNL.  She was last seen in August, and scheduled for TKA on September 11.  Her movement was somewhat limited at the time, with her pain level being around 6.  She was using meloxicam daily, in addition to SalonPas and diclofenac cream on bad days.  Her knee surgery was without complication, but post-operatively she developed nausea and dizziness with ambulation and noted to have orthostatic hypotension.  Her sodium level dropped to 122.  OH was thought to be due to blood loss and antihypertensive medication in combination.  She was finally discharged on valsartan 160 mg and told to hold off on the carvedilol  Today she returns for follow up.  Notes that she finally had her catheter removed last Monday and feels much better.  Getting around  with a cane and notes she manages pain with 650 mg APAP 3-4 times per day.   Has been monitoring her BP at home, notes that her heart rate has been higher and her BP readings have been "creeping up".    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;   Diet: more home cooked meals, does some take and bake; Weight watchers plan; plenty of f/v, mostly fresh, steamed or occasionally sauteed; not adding salt, but some processed foods   Exercise: PT for recent TKA  Home BP readings:  18 readings average 136/75 HR 82  (range 120-152/63-87)   Accessory Clinical Findings    Lab Results  Component Value Date   CREATININE 0.43 (L) 09/17/2023   BUN 10 09/17/2023   NA 131 (L) 09/17/2023   K 4.5 09/17/2023   CL 95 (L) 09/17/2023   CO2 29 09/17/2023   Lab Results  Component Value Date   ALT 15 09/17/2023   AST 20 09/17/2023   ALKPHOS 58 02/22/2019   BILITOT 0.7 09/17/2023   No results found for: "HGBA1C"  Screening for Secondary Hypertension:      Relevant Labs/Studies:    Latest Ref Rng & Units 09/17/2023  12:43 PM 09/13/2023    3:27 AM 09/12/2023    3:28 AM  Basic Labs  Sodium 135 - 146 mmol/L 131  132  132   Potassium 3.5 - 5.3 mmol/L 4.5  3.8  3.6   Creatinine 0.60 - 1.00 mg/dL 2.95  6.21  3.08        Latest Ref Rng & Units 09/10/2023    3:36 AM 03/12/2016   11:00 AM  Thyroid   TSH 0.350 - 4.500 uIU/mL 2.768  2.51                  Home Medications    Current Outpatient Medications  Medication Sig Dispense Refill   carvedilol (COREG) 3.125 MG tablet Take 1 tablet (3.125 mg total) by mouth 2 (two) times daily. 180 tablet 3   acetaminophen (TYLENOL) 325 MG tablet Take 650 mg by mouth every 6 (six) hours as  needed (pain.).     alendronate (FOSAMAX) 70 MG tablet Take 1 tablet (70 mg total) by mouth every 7 (seven) days. Take with a full glass of water on an empty stomach. (Patient taking differently: Take 70 mg by mouth every Thursday. Take with a full glass of water on an empty stomach.) 4 tablet 11   bismuth subsalicylate (PEPTO BISMOL) 262 MG chewable tablet Chew 262-524 mg by mouth 4 (four) times daily as needed for diarrhea or loose stools or indigestion.     bismuth subsalicylate (PEPTO BISMOL) 262 MG/15ML suspension Take 30 mLs by mouth every 6 (six) hours as needed for diarrhea or loose stools or indigestion.     carboxymethylcellulose (REFRESH PLUS) 0.5 % SOLN Place 1 drop into both eyes 3 (three) times daily as needed (dry/irritated eyes.).     Cholecalciferol (VITAMIN D-3 PO) Take 2,000 Units by mouth in the morning.     clotrimazole (LOTRIMIN) 1 % external solution Apply 1 Application topically 2 (two) times daily as needed (toe nail fungus).     Famotidine (PEPCID PO) Take 10 mg by mouth in the morning.     pantoprazole (PROTONIX) 40 MG tablet Take 1 tablet (40 mg total) by mouth daily for 14 days. 14 tablet 0   PREVIDENT 5000 DRY MOUTH 1.1 % GEL dental gel Place 1 Application onto teeth in the morning.     rosuvastatin (CRESTOR) 20 MG tablet TAKE 1 TABLET(20 MG) BY MOUTH DAILY (Patient taking differently: Take 20 mg by mouth at bedtime.) 90 tablet 3   senna-docusate (SENOKOT-S) 8.6-50 MG tablet Take 1 tablet by mouth daily.     sulfamethoxazole-trimethoprim (BACTRIM DS) 800-160 MG tablet Take 1 tablet by mouth 2 (two) times daily. 10 tablet 0   valsartan (DIOVAN) 160 MG tablet Take 1 tablet (160 mg total) by mouth daily. 30 tablet 0   No current facility-administered medications for this visit.     Assessment & Plan      Benign hypertensive heart disease without heart failure Assessment: BP is uncontrolled in office BP 130/85 mmHg;  above the goal (<130/80). Had stopped carvedilol  and cut valsartan to 160 mg at hospitalization - 2/2 OH Tolerates valsartan well without any side effects Denies SOB, palpitation, chest pain, headaches,or swelling Reiterated the importance of regular exercise and low salt diet   Plan:  Start taking carvedilol 3.125 mg (1/2 of the 6.25 mg tablet) twice daily Continue taking valsartan 160 mg once daily Patient to keep record of BP readings with heart rate and report to [place] at the next visit Patient  to follow up with PharmD in 3 months  Labs ordered today:  none   Phillips Hay PharmD CPP [facility]  [address] [place], [place] [address] [phone]

## 2023-10-12 DIAGNOSIS — M25561 Pain in right knee: Secondary | ICD-10-CM | POA: Diagnosis not present

## 2023-10-12 DIAGNOSIS — M25661 Stiffness of right knee, not elsewhere classified: Secondary | ICD-10-CM | POA: Diagnosis not present

## 2023-10-14 DIAGNOSIS — M25561 Pain in right knee: Secondary | ICD-10-CM | POA: Diagnosis not present

## 2023-10-14 DIAGNOSIS — M25661 Stiffness of right knee, not elsewhere classified: Secondary | ICD-10-CM | POA: Diagnosis not present

## 2023-10-18 ENCOUNTER — Other Ambulatory Visit: Payer: Self-pay | Admitting: Family Medicine

## 2023-10-18 ENCOUNTER — Encounter: Payer: Self-pay | Admitting: Family Medicine

## 2023-10-18 MED ORDER — MELOXICAM 15 MG PO TABS: 15.0000 mg | ORAL_TABLET | Freq: Every day | ORAL | 3 refills | Status: DC

## 2023-10-19 DIAGNOSIS — M25561 Pain in right knee: Secondary | ICD-10-CM | POA: Diagnosis not present

## 2023-10-19 DIAGNOSIS — M25661 Stiffness of right knee, not elsewhere classified: Secondary | ICD-10-CM | POA: Diagnosis not present

## 2023-10-21 ENCOUNTER — Encounter: Payer: Self-pay | Admitting: Pharmacist Clinician (PhC)/ Clinical Pharmacy Specialist

## 2023-10-22 DIAGNOSIS — M25661 Stiffness of right knee, not elsewhere classified: Secondary | ICD-10-CM | POA: Diagnosis not present

## 2023-10-22 DIAGNOSIS — M25561 Pain in right knee: Secondary | ICD-10-CM | POA: Diagnosis not present

## 2023-10-22 DIAGNOSIS — R3915 Urgency of urination: Secondary | ICD-10-CM | POA: Diagnosis not present

## 2023-10-22 DIAGNOSIS — N8189 Other female genital prolapse: Secondary | ICD-10-CM | POA: Diagnosis not present

## 2023-10-22 DIAGNOSIS — R338 Other retention of urine: Secondary | ICD-10-CM | POA: Diagnosis not present

## 2023-10-27 DIAGNOSIS — M25661 Stiffness of right knee, not elsewhere classified: Secondary | ICD-10-CM | POA: Diagnosis not present

## 2023-10-27 DIAGNOSIS — M25561 Pain in right knee: Secondary | ICD-10-CM | POA: Diagnosis not present

## 2023-11-04 ENCOUNTER — Encounter: Payer: Self-pay | Admitting: Family Medicine

## 2023-11-04 DIAGNOSIS — M25661 Stiffness of right knee, not elsewhere classified: Secondary | ICD-10-CM | POA: Diagnosis not present

## 2023-11-04 DIAGNOSIS — M25561 Pain in right knee: Secondary | ICD-10-CM | POA: Diagnosis not present

## 2023-11-10 ENCOUNTER — Telehealth: Payer: Self-pay

## 2023-11-10 NOTE — Telephone Encounter (Signed)
Copied from CRM [id number]. Topic: Clinical - Request for Lab/Test Order >> Nov 10, 2023  2:19 PM Dennison Nancy wrote: Reason for CRM: patient need a blood work done and cbc panel patient was put on iron pills and want to get off the iron pills , Per Dr. Tanya Nones want patient to do lab work before he will take her off.  Please order blood work for patient  Scheduled pt for blood work on Monday 11/25

## 2023-11-11 DIAGNOSIS — M25661 Stiffness of right knee, not elsewhere classified: Secondary | ICD-10-CM | POA: Diagnosis not present

## 2023-11-11 DIAGNOSIS — M25561 Pain in right knee: Secondary | ICD-10-CM | POA: Diagnosis not present

## 2023-11-15 ENCOUNTER — Other Ambulatory Visit: Payer: Medicare PPO

## 2023-11-15 DIAGNOSIS — D649 Anemia, unspecified: Secondary | ICD-10-CM

## 2023-11-15 LAB — CBC WITH DIFFERENTIAL/PLATELET
Absolute Lymphocytes: 1420 {cells}/uL (ref 850–3900)
Absolute Monocytes: 360 {cells}/uL (ref 200–950)
Basophils Absolute: 21 {cells}/uL (ref 0–200)
Basophils Relative: 0.4 %
Eosinophils Absolute: 111 {cells}/uL (ref 15–500)
Eosinophils Relative: 2.1 %
HCT: 38.7 % (ref 35.0–45.0)
Hemoglobin: 12.5 g/dL (ref 11.7–15.5)
MCH: 30.3 pg (ref 27.0–33.0)
MCHC: 32.3 g/dL (ref 32.0–36.0)
MCV: 93.9 fL (ref 80.0–100.0)
MPV: 9.6 fL (ref 7.5–12.5)
Monocytes Relative: 6.8 %
Neutro Abs: 3387 {cells}/uL (ref 1500–7800)
Neutrophils Relative %: 63.9 %
Platelets: 185 10*3/uL (ref 140–400)
RBC: 4.12 10*6/uL (ref 3.80–5.10)
RDW: 12.5 % (ref 11.0–15.0)
Total Lymphocyte: 26.8 %
WBC: 5.3 10*3/uL (ref 3.8–10.8)

## 2023-11-22 DIAGNOSIS — L738 Other specified follicular disorders: Secondary | ICD-10-CM | POA: Diagnosis not present

## 2023-11-22 DIAGNOSIS — L814 Other melanin hyperpigmentation: Secondary | ICD-10-CM | POA: Diagnosis not present

## 2023-11-22 DIAGNOSIS — D1801 Hemangioma of skin and subcutaneous tissue: Secondary | ICD-10-CM | POA: Diagnosis not present

## 2023-11-22 DIAGNOSIS — Z85828 Personal history of other malignant neoplasm of skin: Secondary | ICD-10-CM | POA: Diagnosis not present

## 2023-11-22 DIAGNOSIS — L821 Other seborrheic keratosis: Secondary | ICD-10-CM | POA: Diagnosis not present

## 2023-11-22 DIAGNOSIS — L570 Actinic keratosis: Secondary | ICD-10-CM | POA: Diagnosis not present

## 2023-11-24 DIAGNOSIS — M25661 Stiffness of right knee, not elsewhere classified: Secondary | ICD-10-CM | POA: Diagnosis not present

## 2023-11-24 DIAGNOSIS — M25561 Pain in right knee: Secondary | ICD-10-CM | POA: Diagnosis not present

## 2023-12-13 DIAGNOSIS — Z1231 Encounter for screening mammogram for malignant neoplasm of breast: Secondary | ICD-10-CM | POA: Diagnosis not present

## 2023-12-29 ENCOUNTER — Ambulatory Visit: Payer: Medicare PPO | Attending: Cardiology | Admitting: Pharmacist Clinician (PhC)/ Clinical Pharmacy Specialist

## 2023-12-29 ENCOUNTER — Encounter: Payer: Self-pay | Admitting: Pharmacist Clinician (PhC)/ Clinical Pharmacy Specialist

## 2023-12-29 VITALS — BP 176/88 | HR 66

## 2023-12-29 DIAGNOSIS — E7800 Pure hypercholesterolemia, unspecified: Secondary | ICD-10-CM

## 2023-12-29 DIAGNOSIS — I119 Hypertensive heart disease without heart failure: Secondary | ICD-10-CM

## 2023-12-29 NOTE — Assessment & Plan Note (Signed)
 Patient has been off rosuvastatin since knee surgery several months ago.  She was never told to restart and would like to know when she can.  Assured her that she is fine to re-start this and she will do so tonight.

## 2023-12-29 NOTE — Progress Notes (Signed)
 Office Visit    Patient Name: Heather Hoover Date of Encounter: 12/29/2023  Primary Care Provider:  Duanne Butler ONEIDA, MD Primary Cardiologist:  Annabella Scarce, MD  Chief Complaint    Hypertension - Advanced hypertension clinic  Past Medical History   HLD 7/24 LDL 59 on rosuvastatin  20   Congenital pulmonic stenosis Stable, consistent gradient over past 6 years    Allergies  Allergen Reactions   Augmentin  [Amoxicillin -Pot Clavulanate] Diarrhea    Did it involve swelling of the face/tongue/throat, SOB, or low BP? yes Did it involve sudden or severe rash/hives, skin peeling, or any reaction on the inside of your mouth or nose? no Did you need to seek medical attention at a hospital or doctor's office? no When did it last happen?      2000 If all above answers are "NO", may proceed with cephalosporin use.    Niacin-Lovastatin Er     Flushing,itching,syncope   Kenalog [Triamcinolone ] Palpitations    History of Present Illness    Heather Hoover is a 78 y.o. female patient of Dr. Scarce, in the office today for hypertension follow up.  She has been seen multiple times in clinic, often with elevated readings, but noting home readings WNL.  She was last seen in August, and scheduled for TKA on September 11.  Her movement was somewhat limited at the time, with her pain level being around 6.  She was using meloxicam  daily, in addition to SalonPas and diclofenac  cream on bad days.  Her knee surgery was without complication, but post-operatively she developed nausea and dizziness with ambulation and noted to have orthostatic hypotension.  Her sodium level dropped to 122.  OH was thought to be due to blood loss and antihypertensive medication in combination.  She was finally discharged on valsartan  160 mg and told to hold off on the carvedilol .  When I saw her in October we restarted the carvedilol , as her home readings had been creeping up as had her heart rate.    Today she  returns for follow up.   She reports feeling well overall, and home readings have looked good since she was last seen.  She does note a week of elevated readings in November, just after getting flu and covid vaccines.  States that her husbands blood pressure was also elevated for several days after vaccination.     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;   Diet: more home cooked meals, does some take and bake; Weight watchers plan; plenty of f/v, mostly fresh, steamed or occasionally sauteed; not adding salt, but some processed foods   Exercise: PT for recent TKA  Home BP readings:  home cuff reads within 5 points of office device  Since her last visit average BP 132/71 (24 readings)   Accessory Clinical Findings    Lab Results  Component Value Date   CREATININE 0.43 (L) 09/17/2023   BUN 10 09/17/2023   NA 131 (L) 09/17/2023   K 4.5 09/17/2023   CL 95 (L) 09/17/2023   CO2 29 09/17/2023   Lab Results  Component Value Date   ALT 15 09/17/2023   AST 20 09/17/2023   ALKPHOS 58 02/22/2019  BILITOT 0.7 09/17/2023   No results found for: HGBA1C  Screening for Secondary Hypertension:      Relevant Labs/Studies:    Latest Ref Rng & Units 09/17/2023   12:43 PM 09/13/2023    3:27 AM 09/12/2023    3:28 AM  Basic Labs  Sodium 135 - 146 mmol/L 131  132  132   Potassium 3.5 - 5.3 mmol/L 4.5  3.8  3.6   Creatinine 0.60 - 1.00 mg/dL 9.56  9.67  9.62        Latest Ref Rng & Units 09/10/2023    3:36 AM 03/12/2016   11:00 AM  Thyroid   TSH 0.350 - 4.500 uIU/mL 2.768  2.51                  Home Medications    Current Outpatient Medications  Medication Sig Dispense Refill    Cholecalciferol (VITAMIN D-3 PO) Take 2,000 Units by mouth in the morning.     acetaminophen  (TYLENOL ) 325 MG tablet Take 650 mg by mouth every 6 (six) hours as needed (pain.).     alendronate  (FOSAMAX ) 70 MG tablet Take 1 tablet (70 mg total) by mouth every 7 (seven) days. Take with a full glass of water  on an empty stomach. (Patient taking differently: Take 70 mg by mouth every Thursday. Take with a full glass of water  on an empty stomach.) 4 tablet 11   carboxymethylcellulose (REFRESH PLUS) 0.5 % SOLN Place 1 drop into both eyes 3 (three) times daily as needed (dry/irritated eyes.).     carvedilol  (COREG ) 3.125 MG tablet Take 1 tablet (3.125 mg total) by mouth 2 (two) times daily. 180 tablet 3   clotrimazole (LOTRIMIN) 1 % external solution Apply 1 Application topically 2 (two) times daily as needed (toe nail fungus).     meloxicam  (MOBIC ) 15 MG tablet Take 1 tablet (15 mg total) by mouth daily. 30 tablet 3   PREVIDENT 5000 DRY MOUTH 1.1 % GEL dental gel Place 1 Application onto teeth in the morning.     rosuvastatin  (CRESTOR ) 20 MG tablet TAKE 1 TABLET(20 MG) BY MOUTH DAILY (Patient taking differently: Take 20 mg by mouth at bedtime.) 90 tablet 3   valsartan  (DIOVAN ) 160 MG tablet Take 1 tablet (160 mg total) by mouth daily. 30 tablet 0   No current facility-administered medications for this visit.     Assessment & Plan   HYPERTENSION CONTROL Vitals:   12/29/23 1347 12/29/23 1351  BP: (!) 181/86 (!) 176/88    The patient's blood pressure is elevated above target today.  In order to address the patient's elevated BP: Blood pressure will be monitored at home to determine if medication changes need to be made.; The blood pressure is usually elevated in clinic.  Blood pressures monitored at home have been optimal.     Benign hypertensive heart disease without heart failure Assessment: BP is uncontrolled in office BP 176/88 mmHg;  above the goal (<130/80). Home cuff validated in office,  home average over past 2 months is 132/71 Tolerates valsartan  and carvedilol  well without any side effects Denies SOB, palpitation, chest pain, headaches,or swelling Reiterated the importance of regular exercise and low salt diet   Plan:  Continue taking valsartan  160 mg every day,  carvedilol  3.125 mg bid Patient to keep record of BP readings with heart rate and report to us  at the next visit Patient to follow up with Dr. Raford in June  Labs ordered today: none   Hypercholesterolemia  Patient has been off rosuvastatin  since knee surgery several months ago.  She was never told to restart and would like to know when she can.  Assured her that she is fine to re-start this and she will do so tonight.    Ghina Bittinger PharmD CPP [facility]  [address] [place], [place] [address] [phone]

## 2023-12-29 NOTE — Assessment & Plan Note (Signed)
 Assessment: BP is uncontrolled in office BP 176/88 mmHg;  above the goal (<130/80). Home cuff validated in office, home average over past 2 months is 132/71 Tolerates valsartan  and carvedilol  well without any side effects Denies SOB, palpitation, chest pain, headaches,or swelling Reiterated the importance of regular exercise and low salt diet   Plan:  Continue taking valsartan  160 mg every day,  carvedilol  3.125 mg bid Patient to keep record of BP readings with heart rate and report to us  at the next visit Patient to follow up with Dr. Raford in June  Labs ordered today: none

## 2023-12-29 NOTE — Patient Instructions (Signed)
 Follow up appointment: with Dr. Raford in late June  Take your BP meds as follows:  Continue with your current medications  Check your blood pressure at home daily (if able) and keep record of the readings.  Your blood pressure goal is < 1300/80  To check your pressure at home you will need to:  1. Sit up in a chair, with feet flat on the floor and back supported. Do not cross your ankles or legs. 2. Rest your left arm so that the cuff is about heart level. If the cuff goes on your upper arm,  then just relax the arm on the table, arm of the chair or your lap. If you have a wrist cuff, we  suggest relaxing your wrist against your chest (think of it as Pledging the Flag with the  wrong arm).  3. Place the cuff snugly around your arm, about 1 inch above the crook of your elbow. The  cords should be inside the groove of your elbow.  4. Sit quietly, with the cuff in place, for about 5 minutes. After that 5 minutes press the power  button to start a reading. 5. Do not talk or move while the reading is taking place.  6. Record your readings on a sheet of paper. Although most cuffs have a memory, it is often  easier to see a pattern developing when the numbers are all in front of you.  7. You can repeat the reading after 1-3 minutes if it is recommended  Make sure your bladder is empty and you have not had caffeine or tobacco within the last 30 min  Always bring your blood pressure log with you to your appointments. If you have not brought your monitor in to be double checked for accuracy, please bring it to your next appointment.  You can find a list of quality blood pressure cuffs at [email or web address][employer]no  Important lifestyle changes to control high blood pressure  Intervention  Effect on the BP  Lose extra pounds and watch your waistline Weight loss is one of the most effective lifestyle changes for controlling blood pressure. If you're overweight or obese, losing even a small amount of  weight can help reduce blood pressure. Blood pressure might go down by about 1 millimeter of mercury (mm Hg) with each kilogram (about 2.2 pounds) of weight lost.  Exercise regularly As a general goal, aim for at least 30 minutes of moderate physical activity every day. Regular physical activity can lower high blood pressure by about 5 to 8 mm Hg.  Eat a healthy diet Eating a diet rich in whole grains, fruits, vegetables, and low-fat dairy products and low in saturated fat and cholesterol. A healthy diet can lower high blood pressure by up to 11 mm Hg.  Reduce salt (sodium) in your diet Even a small reduction of sodium in the diet can improve heart health and reduce high blood pressure by about 5 to 6 mm Hg.  Limit alcohol  One drink equals 12 ounces of beer, 5 ounces of wine, or 1.5 ounces of 80-proof liquor.  Limiting alcohol  to less than one drink a day for women or two drinks a day for men can help lower blood pressure by about 4 mm Hg.   If you have any questions or concerns please use My Chart to send questions or call the office at [phone]

## 2023-12-30 ENCOUNTER — Encounter (HOSPITAL_BASED_OUTPATIENT_CLINIC_OR_DEPARTMENT_OTHER): Payer: Self-pay

## 2024-02-08 ENCOUNTER — Encounter: Payer: Self-pay | Admitting: Pharmacist Clinician (PhC)/ Clinical Pharmacy Specialist

## 2024-02-08 MED ORDER — VALSARTAN 160 MG PO TABS: 160.0000 mg | ORAL_TABLET | Freq: Every day | ORAL | 3 refills | Status: AC

## 2024-02-16 DIAGNOSIS — H2511 Age-related nuclear cataract, right eye: Secondary | ICD-10-CM | POA: Diagnosis not present

## 2024-02-16 DIAGNOSIS — G43B0 Ophthalmoplegic migraine, not intractable: Secondary | ICD-10-CM | POA: Diagnosis not present

## 2024-02-16 DIAGNOSIS — H04123 Dry eye syndrome of bilateral lacrimal glands: Secondary | ICD-10-CM | POA: Diagnosis not present

## 2024-02-16 DIAGNOSIS — H43813 Vitreous degeneration, bilateral: Secondary | ICD-10-CM | POA: Diagnosis not present

## 2024-02-16 DIAGNOSIS — H10413 Chronic giant papillary conjunctivitis, bilateral: Secondary | ICD-10-CM | POA: Diagnosis not present

## 2024-02-16 DIAGNOSIS — D3132 Benign neoplasm of left choroid: Secondary | ICD-10-CM | POA: Diagnosis not present

## 2024-03-02 DIAGNOSIS — H2511 Age-related nuclear cataract, right eye: Secondary | ICD-10-CM | POA: Diagnosis not present

## 2024-03-13 DIAGNOSIS — H2511 Age-related nuclear cataract, right eye: Secondary | ICD-10-CM | POA: Diagnosis not present

## 2024-03-14 DIAGNOSIS — H2512 Age-related nuclear cataract, left eye: Secondary | ICD-10-CM | POA: Diagnosis not present

## 2024-04-03 DIAGNOSIS — H903 Sensorineural hearing loss, bilateral: Secondary | ICD-10-CM | POA: Diagnosis not present

## 2024-06-06 ENCOUNTER — Ambulatory Visit (HOSPITAL_BASED_OUTPATIENT_CLINIC_OR_DEPARTMENT_OTHER): Payer: Medicare PPO | Admitting: Cardiovascular Disease

## 2024-06-06 VITALS — BP 160/64 | HR 69 | Ht 64.0 in | Wt 171.2 lb

## 2024-06-06 DIAGNOSIS — R0609 Other forms of dyspnea: Secondary | ICD-10-CM | POA: Diagnosis not present

## 2024-06-06 DIAGNOSIS — Q221 Congenital pulmonary valve stenosis: Secondary | ICD-10-CM

## 2024-06-06 DIAGNOSIS — I119 Hypertensive heart disease without heart failure: Secondary | ICD-10-CM

## 2024-06-06 NOTE — Progress Notes (Signed)
 Cardiology Office Note:  .   Date:  06/21/2024  ID:  Heather Hoover, DOB 10-11-1946, MRN [id number] PCP: Duanne Butler ONEIDA, MD  [facility] Providers Cardiologist:  Annabella Scarce, MD    History of Present Illness: .    Heather Hoover is a 78 y.o. female with a history of hypertension, hyperlipidemia, and congenital pulmonic stenosis, who presents for follow up.  Heather Hoover was previously a patient of Dr. Dominick.  She had heart catheterizations at ages 61 and 56.  Each time she reportedly had mild pulmonic stenosis.  She saw Katheryn Jude 65/2017 and reported chest heaviness after the death of her sister.  She also reported increased shortness of breath.  She had an echo 05/19/16 revealed LVEF 55-60% with mild mitral regurgitation, mild aortic regurgitation and mild pulmonary stenosis. The peak gradient is 20 mmHg.  HCTZ was reduced to 12.5mg  due to low BP.  This was in the setting of losing 50lb. She had a repeat echocardiogram 02/2019 that revealed LVEF 60 to 65% with grade 2 diastolic function.  Pulmonary pressure was mildly elevated at 9.8 mmHg.   Her BP was running low so carvedilol  was reduced. Hydrochlorothiazide  was also reduced due to dry mouth and eyes.  She was tested for Sjogren's and it was negative. 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 Her BP was poorly controlled in the office but she thought it had been controlled at home. She was referred to the prep program at the [facility] and she followed up with our pharmacist where Valsartan  was increased with improvement in her blood pressures.   At her visit 09/2022, blood pressures were elevated in the office but averaging 120-130s/70-80s at home. She noted significant stress; she had been eating more and decreased activity. She was encouraged to work on lifestyle changes. Also complained of non-exertional dyspnea and weight gain. We ordered coronary CTA 10/2022 revealing a coronary calcium  score of 0.  There was bi-atrial enlargement with severe RAE. Pulmonary arteries were dilated particularly on the left. Thickened, tri leaflet pulmonary valve with some doming in systole. She followed up with Reche Finder, NP 01/19/2023 and they discussed her CTA results. Home blood pressures were routinely less than 130/80. She had recently received an injection in her knee and hoped to increase her physical activity. She was provided information on the Right Start program at [facility].  At her visit 06/2023 she was preparing for R knee replacement.  She had occasional high blood pressure that she attributed to knee pain.  Valsartan  was increased.    Discussed the use of AI scribe software for clinical note transcription with the patient, who gave verbal consent to proceed.  History of Present Illness Heather Hoover recently underwent knee replacement surgery, which was complicated by a prolonged hospital stay of nearly thirteen days due to low blood counts requiring a transfusion, and electrolyte imbalances such as low potassium and sodium levels. Bladder issues necessitated a catheter, which resolved after her bladder function returned. Despite these complications, her knee is now doing well, and physical therapy was beneficial.  She has experienced weight gain after previously losing fifty pounds five to ten years ago. She attributes the weight gain to poor dietary habits, including rewarding herself with food. She acknowledges the need to improve her diet and exercise routine. She has been riding a recumbent bike at [employer] but feels she needs to increase her physical activity.  She has experienced additional acid reflux, which she initially  attributed to overeating. Despite reducing her food intake, the reflux persists.  Her blood pressure readings at home have been variable, with the lowest being 104 mmHg and the highest 144 mmHg. She feels lightheaded and has difficulty getting going in the morning when  her blood pressure is low. She takes her blood pressure medications, including carvedilol  and valsartan , at night.  She is currently taking rosuvastatin  for cholesterol management. She has a history of cataract surgery and uses hearing aids. She is involved in social activities such as [employer] and a book club, and has a mutual friend with Parkinson's disease.  ROS:  As per HPI  Studies Reviewed: SABRA   EKG 06/06/24: Sinus rhythm.  Rate 69 bpm.  Echo 08/21/2022:  1. Left ventricular ejection fraction, by estimation, is 60 to 65%. The  left ventricle has normal function. The left ventricle has no regional  wall motion abnormalities. Left ventricular diastolic parameters were  normal.   2. Right ventricular systolic function is normal. The right ventricular  size is normal.   3. Mild mitral valve regurgitation.   4. The aortic valve is tricuspid. Aortic valve regurgitation is mild.  Aortic valve sclerosis is present, with no evidence of aortic valve  stenosis.   5. PV is thickened, difficult to see. Peak and mean gradients through the  valve are 15 and 8 mm Hg respectively. No significant change from echo  report in 2020.    Risk Assessment/Calculations:         Physical Exam:   VS:  BP (!) 160/64   Pulse 69   Ht 5' 4 (1.626 m)   Wt 171 lb 3.2 oz (77.7 kg)   SpO2 95%   BMI 29.39 kg/m  , BMI Body mass index is 29.39 kg/m. GENERAL:  Well appearing HEENT: Pupils equal round and reactive, fundi not visualized, oral mucosa unremarkable NECK:  No jugular venous distention, waveform within normal limits, carotid upstroke brisk and symmetric, no bruits, no thyromegaly LUNGS:  Clear to auscultation bilaterally HEART:  RRR.  PMI not displaced or sustained,S1 and S2 within normal limits, no S3, no S4, no clicks, no rubs, no murmurs ABD:  Flat, positive bowel sounds normal in frequency in pitch, no bruits, no rebound, no guarding, no midline pulsatile mass, no hepatomegaly, no  splenomegaly EXT:  2 plus pulses throughout, no edema, no cyanosis no clubbing SKIN:  No rashes no nodules NEURO:  Cranial nerves II through XII grossly intact, motor grossly intact throughout PSYCH:  Cognitively intact, oriented to person place and time   ASSESSMENT AND PLAN: .    Assessment & Plan # Pulmonic stenosis:  She is stable.  No symptoms.  Pulmonic valve was thickened on echo 08/2022 but stable.  Mean gradient 8 mmHg.  We will repeat her echo next year.   # Hypertension Blood pressure well-controlled with occasional low readings. On lower doses of carvedilol  and valsartan . - Continue carvedilol  and valsartan . - Monitor blood pressure regularly at home.  # Hyperlipidemia Due for cholesterol check. On rosuvastatin . - Schedule cholesterol check during upcoming primary care visit. - Continue rosuvastatin  therapy.  # Obesity Weight gain post-surgery. Struggling with diet and exercise. Aware of need for lifestyle changes. - Encourage resumption of exercise routine, including recumbent bike and [employer] program. - Advise on dietary modifications, emphasizing moderation and healthy choices 80% of the time. - Discuss potential benefits of Weight Watchers for weight management.       Dispo: f/u 1 year  Signed, Annabella Scarce, MD

## 2024-06-06 NOTE — Patient Instructions (Addendum)
 Medication Instructions:  Your physician recommends that you continue on your current medications as directed. Please refer to the Current Medication list given to you today.  *If you need a refill on your cardiac medications before your next appointment, please call your pharmacy*  Lab Work: NONE    Testing/Procedures: Your physician has requested that you have an echocardiogram. Echocardiography is a painless test that uses sound waves to create images of your heart. It provides your doctor with information about the size and shape of your heart and how well your heart's chambers and valves are working. This procedure takes approximately one hour. There are no restrictions for this procedure. Please do NOT wear cologne, perfume, aftershave, or lotions (deodorant is allowed). Please arrive 15 minutes prior to your appointment time.  Please note: We ask at that you not bring children with you during ultrasound (echo/ vascular) testing. Due to room size and safety concerns, children are not allowed in the ultrasound rooms during exams. Our front office staff cannot provide observation of children in our lobby area while testing is being conducted. An adult accompanying a patient to their appointment will only be allowed in the ultrasound room at the discretion of the ultrasound technician under special circumstances. We apologize for any inconvenience. TO BE DONE IN 1 YEAR   Follow-Up: At [facility], you and your health needs are our priority.  As part of our continuing mission to provide you with exceptional heart care, our providers are all part of one team.  This team includes your primary Cardiologist (physician) and Advanced Practice Providers or APPs (Physician Assistants and Nurse Practitioners) who all work together to provide you with the care you need, when you need it.  Your next appointment:   12 month(s) AFTER ECHO   Provider:   Maudine Sos, MD, Charlton Cooler NP, OR  Creed Dodrill NP   We recommend signing up for the patient portal called MyChart.  Sign up information is provided on this After Visit Summary.  MyChart is used to connect with patients for Virtual Visits (Telemedicine).  Patients are able to view lab/test results, encounter notes, upcoming appointments, etc.  Non-urgent messages can be sent to your provider as well.   To learn more about what you can do with MyChart, go to [email or web address].

## 2024-06-21 ENCOUNTER — Encounter (HOSPITAL_BASED_OUTPATIENT_CLINIC_OR_DEPARTMENT_OTHER): Payer: Self-pay | Admitting: Cardiovascular Disease

## 2024-07-12 ENCOUNTER — Telehealth: Payer: Self-pay

## 2024-07-12 NOTE — Telephone Encounter (Signed)
 Copied from CRM #[id number]. Topic: Clinical - Request for Lab/Test Order >> Jul 12, 2024 11:00 AM Tobias CROME wrote: Reason for CRM: Patient requesting to have physical labs done before physical appointment with Dr. Duanne on 08/10/24.   Patient requesting orders be placed and a call back to schedule, [phone]

## 2024-07-27 ENCOUNTER — Ambulatory Visit: Payer: Medicare PPO

## 2024-08-03 ENCOUNTER — Other Ambulatory Visit

## 2024-08-03 DIAGNOSIS — E7800 Pure hypercholesterolemia, unspecified: Secondary | ICD-10-CM | POA: Diagnosis not present

## 2024-08-03 DIAGNOSIS — D649 Anemia, unspecified: Secondary | ICD-10-CM | POA: Diagnosis not present

## 2024-08-03 DIAGNOSIS — I10 Essential (primary) hypertension: Secondary | ICD-10-CM

## 2024-08-03 DIAGNOSIS — E871 Hypo-osmolality and hyponatremia: Secondary | ICD-10-CM

## 2024-08-04 ENCOUNTER — Ambulatory Visit: Payer: Self-pay | Admitting: Family Medicine

## 2024-08-04 LAB — CBC WITH DIFFERENTIAL/PLATELET
Absolute Lymphocytes: 1230 {cells}/uL (ref 850–3900)
Absolute Monocytes: 344 {cells}/uL (ref 200–950)
Basophils Absolute: 29 {cells}/uL (ref 0–200)
Basophils Relative: 0.7 %
Eosinophils Absolute: 103 {cells}/uL (ref 15–500)
Eosinophils Relative: 2.5 %
HCT: 42 % (ref 35.0–45.0)
Hemoglobin: 13.9 g/dL (ref 11.7–15.5)
MCH: 31.9 pg (ref 27.0–33.0)
MCHC: 33.1 g/dL (ref 32.0–36.0)
MCV: 96.3 fL (ref 80.0–100.0)
MPV: 9.6 fL (ref 7.5–12.5)
Monocytes Relative: 8.4 %
Neutro Abs: 2394 {cells}/uL (ref 1500–7800)
Neutrophils Relative %: 58.4 %
Platelets: 173 Thousand/uL (ref 140–400)
RBC: 4.36 Million/uL (ref 3.80–5.10)
RDW: 11.8 % (ref 11.0–15.0)
Total Lymphocyte: 30 %
WBC: 4.1 Thousand/uL (ref 3.8–10.8)

## 2024-08-04 LAB — LIPID PANEL
Cholesterol: 160 mg/dL (ref <200)
HDL: 67 mg/dL (ref >=50)
LDL Cholesterol (Calc): 74 mg/dL
Non-HDL Cholesterol (Calc): 93 mg/dL (ref <130)
Total CHOL/HDL Ratio: 2.4 (calc) (ref <5.0)
Triglycerides: 104 mg/dL (ref <150)

## 2024-08-04 LAB — COMPLETE METABOLIC PANEL WITHOUT GFR
AG Ratio: 2 (calc) (ref 1.0–2.5)
ALT: 13 U/L (ref 6–29)
AST: 20 U/L (ref 10–35)
Albumin: 4.5 g/dL (ref 3.6–5.1)
Alkaline phosphatase (APISO): 63 U/L (ref 37–153)
BUN/Creatinine Ratio: 24 (calc) — ABNORMAL HIGH (ref 6–22)
BUN: 13 mg/dL (ref 7–25)
CO2: 32 mmol/L (ref 20–32)
Calcium: 9.6 mg/dL (ref 8.6–10.4)
Chloride: 101 mmol/L (ref 98–110)
Creat: 0.55 mg/dL — ABNORMAL LOW (ref 0.60–1.00)
Globulin: 2.3 g/dL (ref 1.9–3.7)
Glucose, Bld: 95 mg/dL (ref 65–99)
Potassium: 4.3 mmol/L (ref 3.5–5.3)
Sodium: 138 mmol/L (ref 135–146)
Total Bilirubin: 0.4 mg/dL (ref 0.2–1.2)
Total Protein: 6.8 g/dL (ref 6.1–8.1)

## 2024-08-10 ENCOUNTER — Ambulatory Visit: Admitting: Family Medicine

## 2024-08-10 ENCOUNTER — Encounter: Payer: Self-pay | Admitting: Family Medicine

## 2024-08-10 VITALS — BP 132/76 | HR 69 | Temp 97.8°F | Ht 64.0 in | Wt 176.4 lb

## 2024-08-10 DIAGNOSIS — Z0000 Encounter for general adult medical examination without abnormal findings: Secondary | ICD-10-CM

## 2024-08-10 DIAGNOSIS — M810 Age-related osteoporosis without current pathological fracture: Secondary | ICD-10-CM

## 2024-08-10 DIAGNOSIS — Z0001 Encounter for general adult medical examination with abnormal findings: Secondary | ICD-10-CM

## 2024-08-10 DIAGNOSIS — I10 Essential (primary) hypertension: Secondary | ICD-10-CM

## 2024-08-10 MED ORDER — ALENDRONATE SODIUM 70 MG PO TABS: 70.0000 mg | ORAL_TABLET | ORAL | 11 refills | Status: DC

## 2024-08-10 NOTE — Progress Notes (Signed)
 Subjective:    Patient ID: Heather Hoover, female    DOB: 1946-01-02, 78 y.o.   MRN: [id number]  HPI Patient is a very pleasant 78 year old Caucasian female here today for complete physical exam.    Her most recent lab work is shown below and is outstanding.  Since I last saw the patient, she had her right knee replaced.  She also had cataract surgery and has had hearing aids.  She jokes that she is a new woman.  Immunizations are up-to-date except for the RSV vaccine, the flu shot, and a COVID shot.  Her last bone density showed osteoporosis in 2024.  This is due to be repeated in 2027.  Her colonoscopy was in 2021 and she does not require a repeat colonoscopy.  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  Otherwise her preventative care is up-to-date.  Her mammogram is scheduled for December.  Last year she had urinary retention postoperatively.  This has resolved. Lab on 08/03/2024  Component Date Value Ref Range Status   WBC 08/03/2024 4.1  3.8 - 10.8 Thousand/uL Final   RBC 08/03/2024 4.36  3.80 - 5.10 Million/uL Final   Hemoglobin 08/03/2024 13.9  11.7 - 15.5 g/dL Final   HCT 91/85/7974 42.0  35.0 - 45.0 % Final   MCV 08/03/2024 96.3  80.0 - 100.0 fL Final   MCH 08/03/2024 31.9  27.0 - 33.0 pg Final   MCHC 08/03/2024 33.1  32.0 - 36.0 g/dL Final   Comment: For adults, a slight decrease in the calculated MCHC value (in the range of 30 to 32 g/dL) is most likely not clinically significant; however, it should be interpreted with caution in correlation with other red cell parameters and the patient's clinical condition.    RDW 08/03/2024 11.8  11.0 - 15.0 % Final   Platelets 08/03/2024 173  140 - 400 Thousand/uL Final   MPV 08/03/2024 9.6  7.5 - 12.5 fL Final   Neutro Abs 08/03/2024 2,394  1,500 - 7,800 cells/uL Final   Absolute Lymphocytes 08/03/2024 1,230  850 - 3,900 cells/uL Final   Absolute Monocytes 08/03/2024 344  200 - 950 cells/uL  Final   Eosinophils Absolute 08/03/2024 103  15 - 500 cells/uL Final   Basophils Absolute 08/03/2024 29  0 - 200 cells/uL Final   Neutrophils Relative % 08/03/2024 58.4  % Final   Total Lymphocyte 08/03/2024 30.0  % Final   Monocytes Relative 08/03/2024 8.4  % Final   Eosinophils Relative 08/03/2024 2.5  % Final   Basophils Relative 08/03/2024 0.7  % Final   Glucose, Bld 08/03/2024 95  65 - 99 mg/dL Final   Comment: .            Fasting reference interval .    BUN 08/03/2024 13  7 - 25 mg/dL Final   Creat 91/85/7974 0.55 (L)  0.60 - 1.00 mg/dL Final   BUN/Creatinine Ratio 08/03/2024 24 (H)  6 - 22 (calc) Final   Sodium 08/03/2024 138  135 - 146 mmol/L Final   Potassium 08/03/2024 4.3  3.5 - 5.3 mmol/L Final   Chloride 08/03/2024 101  98 - 110 mmol/L Final   CO2 08/03/2024 32  20 - 32 mmol/L Final   Calcium  08/03/2024 9.6  8.6 - 10.4 mg/dL Final   Total Protein 91/85/7974 6.8  6.1 - 8.1 g/dL Final   Albumin 91/85/7974 4.5  3.6 - 5.1 g/dL Final   Globulin 91/85/7974 2.3  1.9 - 3.7 g/dL (calc) Final   AG Ratio 08/03/2024 2.0  1.0 - 2.5 (calc) Final   Total Bilirubin 08/03/2024 0.4  0.2 - 1.2 mg/dL Final   Alkaline phosphatase (APISO) 08/03/2024 63  37 - 153 U/L Final   AST 08/03/2024 20  10 - 35 U/L Final   ALT 08/03/2024 13  6 - 29 U/L Final   Cholesterol 08/03/2024 160  <200 mg/dL Final   HDL 91/85/7974 67  > OR = 50 mg/dL Final   Triglycerides 91/85/7974 104  <150 mg/dL Final   LDL Cholesterol (Calc) 08/03/2024 74  mg/dL (calc) Final   Comment: Reference range: <100 . Desirable range <100 mg/dL for primary prevention;   <70 mg/dL for patients with CHD or diabetic patients  with > or = 2 CHD risk factors. SABRA LDL-C is now calculated using the Martin-Hopkins  calculation, which is a validated novel method providing  better accuracy than the Friedewald equation in the  estimation of LDL-C.  Gladis APPLETHWAITE et al. SANDREA. 7986;689(80): 2061-2068   (http://education.[employer].com/faq/FAQ164)    Total CHOL/HDL Ratio 08/03/2024 2.4  <4.9 (calc) Final   Non-HDL Cholesterol (Calc) 08/03/2024 93  <130 mg/dL (calc) Final   Comment: For patients with diabetes plus 1 major ASCVD risk  factor, treating to a non-HDL-C goal of <100 mg/dL  (LDL-C of <29 mg/dL) is considered a therapeutic  option.    She does complain of some right knee pain and is requesting a cortisone shot in her right knee. Immunization History  Administered Date(s) Administered   Fluad Quad(high Dose 65+) 08/14/2019, 10/02/2020   Influenza, High Dose Seasonal PF 09/17/2017, 10/03/2018   Influenza-Unspecified 09/30/2011, 10/22/2015, 09/17/2017, 09/20/2018, 08/14/2019, 09/20/2021   PFIZER Comirnaty(Gray Top)Covid-19 Tri-Sucrose Vaccine 03/22/2021   PFIZER(Purple Top)SARS-COV-2 Vaccination 01/27/2020, 02/20/2020, 10/21/2020, 08/25/2022   Pneumococcal Conjugate-13 05/21/2014   Pneumococcal Polysaccharide-23 04/06/2017   Td 06/06/2012   Tdap 06/06/2012, 02/11/2019   Zoster Recombinant(Shingrix) 07/28/2021, 11/23/2022   Zoster, Live 10/09/2013    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/2010   echo   Osteopenia 12/2018   T score -1.6 FRAX 10% / 1.5%   Past Surgical History:  Procedure Laterality Date   ABDOMINAL HYSTERECTOMY  02/07/1981   RSO, left one ovary   APPENDECTOMY     BREAST SURGERY     CARDIAC CATHETERIZATION     when in 3rd grade and age 72   CARPAL TUNNEL RELEASE Left 02/06/2021   Procedure: LEFT CARPAL TUNNEL RELEASE;  Surgeon: Murrell Kuba, MD;  Location: [facility];  Service: Orthopedics;  Laterality: Left;  IV REGIONAL FOREARM BLOCK 45 MINUTES   CARPAL TUNNEL RELEASE Right 06/03/2021    Procedure: RIGHT CARPAL TUNNEL RELEASE;  Surgeon: Murrell Kuba, MD;  Location: [facility];  Service: Orthopedics;  Laterality: Right;  45 MIN   COSMETIC SURGERY     forehead plastic surgery     MOH's surgery   FRACTURE SURGERY     right arm   KNEE ARTHROPLASTY Right 09/01/2023   Procedure: COMPUTER ASSISTED TOTAL KNEE ARTHROPLASTY;  Surgeon: Fidel Rogue, MD;  Location: [facility];  Service: Orthopedics;  Laterality: Right;  160  KNEE ARTHROSCOPY     right   Current Outpatient Medications on File Prior to Visit  Medication Sig Dispense Refill   acetaminophen  (TYLENOL ) 325 MG tablet Take 650 mg by mouth every 6 (six) hours as needed (pain.).     carboxymethylcellulose (REFRESH PLUS) 0.5 % SOLN Place 1 drop into both eyes 3 (three) times daily as needed (dry/irritated eyes.).     Cholecalciferol (VITAMIN D-3 PO) Take 2,000 Units by mouth in the morning.     clotrimazole (LOTRIMIN) 1 % external solution Apply 1 Application topically 2 (two) times daily as needed (toe nail fungus).     meloxicam  (MOBIC ) 15 MG tablet Take 1 tablet (15 mg total) by mouth daily. 30 tablet 3   PREVIDENT 5000 DRY MOUTH 1.1 % GEL dental gel Place 1 Application onto teeth in the morning.     raNITIdine HCl (RANITIDINE 75 PO) Take 75 mg by mouth daily.     rosuvastatin  (CRESTOR ) 20 MG tablet TAKE 1 TABLET(20 MG) BY MOUTH DAILY 90 tablet 3   valsartan  (DIOVAN ) 160 MG tablet Take 1 tablet (160 mg total) by mouth daily. 90 tablet 3   amoxicillin  (AMOXIL ) 500 MG tablet Take 2,000 mg by mouth as directed. Take four (4) tablets by mouth 2000 mg 1 hour prior to dental procedures (Patient not taking: Reported on 08/10/2024)     carvedilol  (COREG ) 3.125 MG tablet Take 1 tablet (3.125 mg total) by mouth 2 (two) times daily. 180 tablet 3   No current facility-administered medications on file prior to visit.   Allergies  Allergen Reactions   Augmentin  [Amoxicillin -Pot Clavulanate] Diarrhea    Did it involve  swelling of the face/tongue/throat, SOB, or low BP? yes Did it involve sudden or severe rash/hives, skin peeling, or any reaction on the inside of your mouth or nose? no Did you need to seek medical attention at a hospital or doctor's office? no When did it last happen?      2000 If all above answers are "NO", may proceed with cephalosporin use.    Niacin-Lovastatin Er     Flushing,itching,syncope   Kenalog [Triamcinolone ] Palpitations   Social History   Socioeconomic History   Marital status: Married    Spouse name: Not on file   Number of children: Not on file   Years of education: Not on file   Highest education level: Bachelor's degree (e.g., BA, AB, BS)  Occupational History   Not on file  Tobacco Use   Smoking status: Never   Smokeless tobacco: Never  Vaping Use   Vaping status: Never Used  Substance and Sexual Activity   Alcohol  use: Yes    Comment: Occas   Drug use: No   Sexual activity: Not Currently    Birth control/protection: Post-menopausal    Comment: 1st intercourse 3 yo-1 partner  Other Topics Concern   Not on file  Social History Narrative   Not on file   Social Drivers of Health   Financial Resource Strain: Low Risk  (08/06/2024)   Overall Financial Resource Strain (CARDIA)    Difficulty of Paying Living Expenses: Not hard at all  Food Insecurity: No Food Insecurity (08/06/2024)   Hunger Vital Sign    Worried About Running Out of Food in the Last Year: Never true    Ran Out of Food in the Last Year: Never true  Transportation Needs: No Transportation Needs (08/06/2024)   PRAPARE - Administrator, Civil Service (Medical): No  Lack of Transportation (Non-Medical): No  Physical Activity: Insufficiently Active (08/06/2024)   Exercise Vital Sign    Days of Exercise per Week: 3 days    Minutes of Exercise per Session: 20 min  Stress: No Stress Concern Present (08/06/2024)   [employer] of Occupational Health - Occupational Stress  Questionnaire    Feeling of Stress: Only a little  Social Connections: Moderately Integrated (08/06/2024)   Social Connection and Isolation Panel    Frequency of Communication with Friends and Family: Three times a week    Frequency of Social Gatherings with Friends and Family: Twice a week    Attends Religious Services: Never    Database administrator or Organizations: Yes    Attends Engineer, structural: More than 4 times per year    Marital Status: Married  Catering manager Violence: Not At Risk (09/01/2023)   Humiliation, Afraid, Rape, and Kick questionnaire    Fear of Current or Ex-Partner: No    Emotionally Abused: No    Physically Abused: No    Sexually Abused: No   Family History  Problem Relation Age of Onset   Hypertension Mother    Breast cancer Mother 18       Breast    Cancer Mother        uterine thye think   Cancer Father 57       prostate metastized to bone   Hypertension Father    Arthritis Father    Hypertension Sister    Breast cancer Sister 20   Cancer Sister 77       uterine   Heart failure Sister    Muscular dystrophy Brother       Review of Systems  All other systems reviewed and are negative.      Objective:   Physical Exam Vitals reviewed.  Constitutional:      General: She is not in acute distress.    Appearance: She is well-developed. She is not diaphoretic.  HENT:     Head: Normocephalic and atraumatic.     Right Ear: External ear normal.     Left Ear: External ear normal.     Nose: Nose normal.     Mouth/Throat:     Pharynx: No oropharyngeal exudate.  Eyes:     General: No scleral icterus.       Right eye: No discharge.        Left eye: No discharge.     Conjunctiva/sclera: Conjunctivae normal.     Pupils: Pupils are equal, round, and reactive to light.  Neck:     Thyroid: No thyromegaly.     Vascular: No JVD.     Trachea: No tracheal deviation.  Cardiovascular:     Rate and Rhythm: Normal rate and regular rhythm.      Heart sounds: Murmur heard.     No friction rub. No gallop.  Pulmonary:     Effort: Pulmonary effort is normal. No respiratory distress.     Breath sounds: Normal breath sounds. No stridor. No wheezing or rales.  Chest:     Chest wall: No tenderness.  Abdominal:     General: Bowel sounds are normal. There is no distension.     Palpations: Abdomen is soft. There is no mass.     Tenderness: There is no abdominal tenderness. There is no guarding or rebound.  Musculoskeletal:        General: No tenderness. Normal range of motion.     Cervical back:  Normal range of motion and neck supple.  Lymphadenopathy:     Cervical: No cervical adenopathy.  Skin:    General: Skin is warm.     Coloration: Skin is not pale.     Findings: No erythema or rash.  Neurological:     Mental Status: She is alert and oriented to person, place, and time.     Cranial Nerves: No cranial nerve deficit.     Motor: No abnormal muscle tone.     Coordination: Coordination normal.     Deep Tendon Reflexes: Reflexes are normal and symmetric.  Psychiatric:        Behavior: Behavior normal.        Thought Content: Thought content normal.        Judgment: Judgment normal.           Assessment & Plan:  General medical exam  Benign essential HTN  Osteoporosis, unspecified osteoporosis type, unspecified pathological fracture presence Patient has already scheduled her mammogram.  Her colonoscopy is up-to-date.  She will schedule her Pap smear.  Bone density test is due again in 2027.  She is on Fosamax .  Immunizations are up-to-date except for the flu shot, COVID shot, and RSV vaccine.  Lab work is outstanding.  Blood pressure is excellent.

## 2024-08-16 ENCOUNTER — Other Ambulatory Visit: Payer: Self-pay | Admitting: Cardiovascular Disease

## 2024-08-22 DIAGNOSIS — Z85828 Personal history of other malignant neoplasm of skin: Secondary | ICD-10-CM | POA: Diagnosis not present

## 2024-08-22 DIAGNOSIS — D2262 Melanocytic nevi of left upper limb, including shoulder: Secondary | ICD-10-CM | POA: Diagnosis not present

## 2024-08-22 DIAGNOSIS — L821 Other seborrheic keratosis: Secondary | ICD-10-CM | POA: Diagnosis not present

## 2024-08-22 DIAGNOSIS — L814 Other melanin hyperpigmentation: Secondary | ICD-10-CM | POA: Diagnosis not present

## 2024-08-24 ENCOUNTER — Ambulatory Visit: Admitting: Family Medicine

## 2024-08-24 ENCOUNTER — Encounter: Payer: Self-pay | Admitting: Family Medicine

## 2024-08-24 VITALS — BP 120/72 | HR 68 | Temp 98.6°F | Ht 64.0 in | Wt 176.0 lb

## 2024-08-24 DIAGNOSIS — Z124 Encounter for screening for malignant neoplasm of cervix: Secondary | ICD-10-CM

## 2024-08-24 NOTE — Progress Notes (Signed)
 Subjective:    Patient ID: Heather Hoover, female    DOB: 1946-08-06, 78 y.o.   MRN: [id number]  HPI Here for pap smear.  DES exposure in utero.  Has history of hysterectomy.    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/2010   echo   Osteopenia 12/2018   T score -1.6 FRAX 10% / 1.5%   Past Surgical History:  Procedure Laterality Date   ABDOMINAL HYSTERECTOMY  02/07/1981   RSO, left one ovary   APPENDECTOMY     BREAST SURGERY     CARDIAC CATHETERIZATION     when in 3rd grade and age 33   CARPAL TUNNEL RELEASE Left 02/06/2021   Procedure: LEFT CARPAL TUNNEL RELEASE;  Surgeon: Murrell Kuba, MD;  Location: [facility];  Service: Orthopedics;  Laterality: Left;  IV REGIONAL FOREARM BLOCK 45 MINUTES   CARPAL TUNNEL RELEASE Right 06/03/2021   Procedure: RIGHT CARPAL TUNNEL RELEASE;  Surgeon: Murrell Kuba, MD;  Location: [facility];  Service: Orthopedics;  Laterality: Right;  45 MIN   COSMETIC SURGERY     forehead plastic surgery     MOH's surgery   FRACTURE SURGERY     right arm   KNEE ARTHROPLASTY Right 09/01/2023   Procedure: COMPUTER ASSISTED TOTAL KNEE ARTHROPLASTY;  Surgeon: Fidel Rogue, MD;  Location: [facility];  Service: Orthopedics;  Laterality: Right;  160   KNEE ARTHROSCOPY     right   Current Outpatient Medications on File Prior to Visit  Medication Sig Dispense Refill   acetaminophen  (TYLENOL ) 325 MG tablet Take 650 mg by mouth every 6 (six) hours as needed (pain.).     alendronate  (FOSAMAX ) 70 MG tablet Take 1 tablet (70 mg total) by mouth every 7 (seven) days. Take with a full glass of water  on an empty stomach. 4 tablet 11   amoxicillin  (AMOXIL ) 500 MG tablet Take 2,000 mg by mouth as directed. Take  four (4) tablets by mouth 2000 mg 1 hour prior to dental procedures (Patient not taking: Reported on 08/10/2024)     carboxymethylcellulose (REFRESH PLUS) 0.5 % SOLN Place 1 drop into both eyes 3 (three) times daily as needed (dry/irritated eyes.).     carvedilol  (COREG ) 3.125 MG tablet Take 1 tablet (3.125 mg total) by mouth 2 (two) times daily. 180 tablet 3   Cholecalciferol (VITAMIN D-3 PO) Take 2,000 Units by mouth in the morning.     clotrimazole (LOTRIMIN) 1 % external solution Apply 1 Application topically 2 (two) times daily as needed (toe nail fungus).     meloxicam  (MOBIC ) 15 MG tablet Take 1 tablet (15 mg total) by mouth daily. 30 tablet 3   PREVIDENT 5000 DRY MOUTH 1.1 % GEL dental gel Place 1 Application onto teeth in the morning.     raNITIdine HCl (RANITIDINE 75 PO) Take 75 mg by mouth daily.     rosuvastatin  (CRESTOR ) 20 MG tablet TAKE 1 TABLET(20 MG) BY MOUTH DAILY 90 tablet 3   valsartan  (DIOVAN ) 160 MG tablet Take 1 tablet (160 mg total) by mouth daily. 90 tablet 3   No current facility-administered medications on file prior to visit.  Allergies  Allergen Reactions   Augmentin  [Amoxicillin -Pot Clavulanate] Diarrhea    Did it involve swelling of the face/tongue/throat, SOB, or low BP? yes Did it involve sudden or severe rash/hives, skin peeling, or any reaction on the inside of your mouth or nose? no Did you need to seek medical attention at a hospital or doctor's office? no When did it last happen?      2000 If all above answers are "NO", may proceed with cephalosporin use.    Niacin-Lovastatin Er     Flushing,itching,syncope   Kenalog [Triamcinolone ] Palpitations   Social History   Socioeconomic History   Marital status: Married    Spouse name: Not on file   Number of children: Not on file   Years of education: Not on file   Highest education level: Bachelor's degree (e.g., BA, AB, BS)  Occupational History   Not on file  Tobacco Use   Smoking status: Never    Smokeless tobacco: Never  Vaping Use   Vaping status: Never Used  Substance and Sexual Activity   Alcohol  use: Yes    Comment: Occas   Drug use: No   Sexual activity: Not Currently    Birth control/protection: Post-menopausal    Comment: 1st intercourse 9 yo-1 partner  Other Topics Concern   Not on file  Social History Narrative   Not on file   Social Drivers of Health   Financial Resource Strain: Low Risk  (08/06/2024)   Overall Financial Resource Strain (CARDIA)    Difficulty of Paying Living Expenses: Not hard at all  Food Insecurity: No Food Insecurity (08/06/2024)   Hunger Vital Sign    Worried About Running Out of Food in the Last Year: Never true    Ran Out of Food in the Last Year: Never true  Transportation Needs: No Transportation Needs (08/06/2024)   PRAPARE - Administrator, Civil Service (Medical): No    Lack of Transportation (Non-Medical): No  Physical Activity: Insufficiently Active (08/06/2024)   Exercise Vital Sign    Days of Exercise per Week: 3 days    Minutes of Exercise per Session: 20 min  Stress: No Stress Concern Present (08/06/2024)   [employer] of Occupational Health - Occupational Stress Questionnaire    Feeling of Stress: Only a little  Social Connections: Moderately Integrated (08/06/2024)   Social Connection and Isolation Panel    Frequency of Communication with Friends and Family: Three times a week    Frequency of Social Gatherings with Friends and Family: Twice a week    Attends Religious Services: Never    Database administrator or Organizations: Yes    Attends Engineer, structural: More than 4 times per year    Marital Status: Married  Catering manager Violence: Not At Risk (09/01/2023)   Humiliation, Afraid, Rape, and Kick questionnaire    Fear of Current or Ex-Partner: No    Emotionally Abused: No    Physically Abused: No    Sexually Abused: No   Family History  Problem Relation Age of Onset   Hypertension  Mother    Breast cancer Mother 26       Breast    Cancer Mother        uterine thye think   Cancer Father 47       prostate metastized to bone   Hypertension Father    Arthritis Father    Hypertension Sister    Breast cancer Sister 21   Cancer Sister  55       uterine   Heart failure Sister    Muscular dystrophy Brother       Review of Systems  All other systems reviewed and are negative.      Objective:   Physical Exam Vitals reviewed. Exam conducted with a chaperone present.  Constitutional:      General: She is not in acute distress.    Appearance: She is well-developed. She is not diaphoretic.  HENT:     Head: Normocephalic and atraumatic.  Neck:     Thyroid: No thyromegaly.     Vascular: No JVD.     Trachea: No tracheal deviation.  Cardiovascular:     Rate and Rhythm: Normal rate and regular rhythm.     Heart sounds: Murmur heard.     No friction rub. No gallop.  Pulmonary:     Effort: Pulmonary effort is normal. No respiratory distress.     Breath sounds: Normal breath sounds. No stridor. No wheezing or rales.  Chest:     Chest wall: No tenderness.  Abdominal:     General: Bowel sounds are normal. There is no distension.     Palpations: Abdomen is soft. There is no mass.     Tenderness: There is no abdominal tenderness. There is no guarding or rebound.  Genitourinary:    Exam position: Lithotomy position.     Vagina: Normal. No vaginal discharge, tenderness or lesions.     Uterus: Absent.      Adnexa:        Right: No mass or tenderness.         Left: No mass or tenderness.    Musculoskeletal:        General: Normal range of motion.  Neurological:     Mental Status: She is alert.     Motor: No abnormal muscle tone.     Deep Tendon Reflexes: Reflexes are normal and symmetric.        Assessment & Plan:  Cervical cancer screening - Plan: Pap IG, CT/NG NAA, and HPV (high risk) [facility] Pap sent to pathology in labeled container.

## 2024-08-29 ENCOUNTER — Ambulatory Visit: Payer: Self-pay | Admitting: Family Medicine

## 2024-08-29 LAB — PAP IG, CT-NG NAA, HPV HIGH-RISK
C. trachomatis RNA, TMA: NOT DETECTED
HPV DNA High Risk: NOT DETECTED
N. gonorrhoeae RNA, TMA: NOT DETECTED

## 2024-10-11 ENCOUNTER — Other Ambulatory Visit (HOSPITAL_BASED_OUTPATIENT_CLINIC_OR_DEPARTMENT_OTHER): Payer: Medicare PPO

## 2024-10-20 DIAGNOSIS — H43813 Vitreous degeneration, bilateral: Secondary | ICD-10-CM | POA: Diagnosis not present

## 2024-10-20 DIAGNOSIS — H10413 Chronic giant papillary conjunctivitis, bilateral: Secondary | ICD-10-CM | POA: Diagnosis not present

## 2024-10-20 DIAGNOSIS — D3132 Benign neoplasm of left choroid: Secondary | ICD-10-CM | POA: Diagnosis not present

## 2024-10-20 DIAGNOSIS — G43B0 Ophthalmoplegic migraine, not intractable: Secondary | ICD-10-CM | POA: Diagnosis not present

## 2024-11-12 ENCOUNTER — Other Ambulatory Visit: Payer: Self-pay | Admitting: Family Medicine

## 2024-11-13 NOTE — Telephone Encounter (Signed)
 Requested Prescriptions  Pending Prescriptions Disp Refills   meloxicam  (MOBIC ) 15 MG tablet [Pharmacy Med Name: MELOXICAM  15MG  TABLETS] 30 tablet 1    Sig: TAKE 1 TABLET(15 MG) BY MOUTH DAILY     Analgesics:  COX2 Inhibitors Failed - 11/13/2024  5:40 PM      Failed - Manual Review: Labs are only required if the patient has taken medication for more than 8 weeks.      Failed - Cr in normal range and within 360 days    Creat  Date Value Ref Range Status  08/03/2024 0.55 (L) 0.60 - 1.00 mg/dL Final         Failed - eGFR is 30 or above and within 360 days    GFR, Est African American  Date Value Ref Range Status  04/13/2017 >89 >=60 mL/min Final   GFR calc Af Amer  Date Value Ref Range Status  02/22/2019 103 >59 mL/min/1.73 Final   GFR, Est Non African American  Date Value Ref Range Status  04/13/2017 89 >=60 mL/min Final   GFR, Estimated  Date Value Ref Range Status  09/13/2023 >60 >60 mL/min Final    Comment:    (NOTE) Calculated using the CKD-EPI Creatinine Equation (2021)    GFR  Date Value Ref Range Status  04/04/2015 84.31 >60.00 mL/min Final   eGFR  Date Value Ref Range Status  09/17/2023 101 > OR = 60 mL/min/1.66m2 Final  10/23/2022 92 >59 mL/min/1.73 Final         Passed - HGB in normal range and within 360 days    Hemoglobin  Date Value Ref Range Status  08/03/2024 13.9 11.7 - 15.5 g/dL Final         Passed - HCT in normal range and within 360 days    HCT  Date Value Ref Range Status  08/03/2024 42.0 35.0 - 45.0 % Final         Passed - AST in normal range and within 360 days    AST  Date Value Ref Range Status  08/03/2024 20 10 - 35 U/L Final         Passed - ALT in normal range and within 360 days    ALT  Date Value Ref Range Status  08/03/2024 13 6 - 29 U/L Final         Passed - Patient is not pregnant      Passed - Valid encounter within last 12 months    Recent Outpatient Visits           2 months ago Cervical cancer screening    [facility] Duanne Butler DASEN, MD   3 months ago General medical exam   [facility] Duanne Butler DASEN, MD   1 year ago Urinary retention   [facility] Duanne Butler DASEN, MD   1 year ago Hyponatremia   [facility] Duanne Butler DASEN, MD   1 year ago Encounter for Medicare annual wellness exam   [facility] Pickard, Butler DASEN, MD

## 2024-11-21 ENCOUNTER — Other Ambulatory Visit: Payer: Self-pay | Admitting: Cardiovascular Disease

## 2024-11-26 ENCOUNTER — Encounter (HOSPITAL_BASED_OUTPATIENT_CLINIC_OR_DEPARTMENT_OTHER): Payer: Self-pay | Admitting: Cardiovascular Disease

## 2024-11-27 MED ORDER — CARVEDILOL 3.125 MG PO TABS: 3.1250 mg | ORAL_TABLET | Freq: Two times a day (BID) | ORAL | 2 refills | Status: AC

## 2024-11-29 DIAGNOSIS — Z91B Personal risk factor of exposure to diethylstilbestrol: Secondary | ICD-10-CM | POA: Insufficient documentation

## 2024-12-18 LAB — HM MAMMOGRAPHY

## 2024-12-26 ENCOUNTER — Encounter: Payer: Self-pay | Admitting: Family Medicine

## 2024-12-26 ENCOUNTER — Ambulatory Visit (HOSPITAL_BASED_OUTPATIENT_CLINIC_OR_DEPARTMENT_OTHER): Admitting: Obstetrics & Gynecology

## 2024-12-26 ENCOUNTER — Ambulatory Visit: Admitting: Family Medicine

## 2024-12-26 ENCOUNTER — Other Ambulatory Visit: Payer: Self-pay | Admitting: Family Medicine

## 2024-12-26 ENCOUNTER — Telehealth: Payer: Self-pay

## 2024-12-26 VITALS — BP 122/81 | HR 67 | Temp 97.4°F | Ht 64.0 in | Wt 174.2 lb

## 2024-12-26 DIAGNOSIS — N3000 Acute cystitis without hematuria: Secondary | ICD-10-CM | POA: Insufficient documentation

## 2024-12-26 DIAGNOSIS — R300 Dysuria: Secondary | ICD-10-CM

## 2024-12-26 LAB — URINALYSIS, ROUTINE W REFLEX MICROSCOPIC
Bilirubin Urine: NEGATIVE
Glucose, UA: NEGATIVE
Ketones, ur: NEGATIVE
Nitrite: POSITIVE — AB
Specific Gravity, Urine: 1.025 (ref 1.001–1.035)
pH: 5.5 (ref 5.0–8.0)

## 2024-12-26 LAB — MICROSCOPIC MESSAGE

## 2024-12-26 MED ORDER — ALENDRONATE SODIUM 70 MG PO TABS: 70.0000 mg | ORAL_TABLET | ORAL | 3 refills | Status: AC

## 2024-12-26 MED ORDER — MELOXICAM 15 MG PO TABS: 15.0000 mg | ORAL_TABLET | Freq: Every day | ORAL | 1 refills | Status: AC

## 2024-12-26 MED ORDER — SULFAMETHOXAZOLE-TRIMETHOPRIM 800-160 MG PO TABS: 1.0000 | ORAL_TABLET | Freq: Two times a day (BID) | ORAL | 0 refills | Status: DC

## 2024-12-26 NOTE — Progress Notes (Signed)
 "  Acute Office Visit  Patient ID: Heather Hoover, female    DOB: 07-29-1946, 79 y.o.   MRN: [id number]  PCP: Duanne Butler ONEIDA, MD  Chief Complaint  Patient presents with   Acute Visit    Possible UTI: frequency, Burning, Urgency, cloudy urine  discomfort, trouble making urine, painful voiding x 10 days      Subjective:     HPI  Discussed the use of AI scribe software for clinical note transcription with the patient, who gave verbal consent to proceed.  History of Present Illness Heather Hoover is a 79 year old female who presents with symptoms of a urinary tract infection.  She has been experiencing symptoms for approximately ten days, including dysuria, particularly towards the end of micturition, and increased urinary frequency. The urine is described as cloudy but not dark. No fever, chills, body aches, or hematuria are reported.  She has a history of bladder spasms and questions whether she is fully emptying her bladder, noting discomfort after urination at night. She currently wears a pad due to urinary frequency, which she believes may contribute to irritation and urinary tract infections.  Her past medical history includes knee surgery, during which she experienced bladder issues requiring catheterization. She has a history of pneumonic stenosis and has taken amoxicillin  in the past without issues, although she is not allergic to penicillin.   Review of Systems  All other systems reviewed and are negative.   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 10/28/2010   echo   Neuromuscular  disorder (HCC) 2020   Carpal tunnel surgery 01/2021 (left)05/2021(rt)   Osteopenia 12/2018   T score -1.6 FRAX 10% / 1.5%    Past Surgical History:  Procedure Laterality Date   ABDOMINAL HYSTERECTOMY  02/07/1981   RSO, left one ovary   APPENDECTOMY     BREAST SURGERY     CARDIAC CATHETERIZATION     when in 3rd grade and age 49   CARPAL TUNNEL RELEASE Left 02/06/2021   Procedure: LEFT CARPAL TUNNEL RELEASE;  Surgeon: Murrell Kuba, MD;  Location: [facility];  Service: Orthopedics;  Laterality: Left;  IV REGIONAL FOREARM BLOCK 45 MINUTES   CARPAL TUNNEL RELEASE Right 06/03/2021   Procedure: RIGHT CARPAL TUNNEL RELEASE;  Surgeon: Murrell Kuba, MD;  Location: [facility];  Service: Orthopedics;  Laterality: Right;  45 MIN   COSMETIC SURGERY     forehead plastic surgery     MOH's surgery   FRACTURE SURGERY     right arm   KNEE ARTHROPLASTY Right 09/01/2023   Procedure: COMPUTER ASSISTED TOTAL KNEE ARTHROPLASTY;  Surgeon: Fidel Rogue, MD;  Location: [facility];  Service: Orthopedics;  Laterality: Right;  160   KNEE ARTHROSCOPY     right    Outpatient Medications Prior to Visit  Medication Sig Dispense Refill   acetaminophen  (TYLENOL ) 325 MG tablet Take 650 mg by mouth every 6 (six) hours as needed (pain.).     alendronate  (FOSAMAX ) 70 MG tablet  Take 1 tablet (70 mg total) by mouth every 7 (seven) days. Take with a full glass of water  on an empty stomach. 4 tablet 11   carboxymethylcellulose (REFRESH PLUS) 0.5 % SOLN Place 1 drop into both eyes 3 (three) times daily as needed (dry/irritated eyes.).     carvedilol  (COREG ) 3.125 MG tablet Take 1 tablet (3.125 mg total) by mouth 2 (two) times daily. 180 tablet 2   Cholecalciferol (VITAMIN D-3 PO) Take 2,000 Units by mouth in the morning.     clotrimazole (LOTRIMIN) 1 % external solution Apply 1 Application topically 2 (two) times daily as needed (toe nail fungus).     cycloSPORINE (RESTASIS) 0.05 % ophthalmic  emulsion Place 1 drop into both eyes 2 (two) times daily.     meloxicam  (MOBIC ) 15 MG tablet TAKE 1 TABLET(15 MG) BY MOUTH DAILY 30 tablet 1   PREVIDENT 5000 DRY MOUTH 1.1 % GEL dental gel Place 1 Application onto teeth in the morning.     raNITIdine HCl (RANITIDINE 75 PO) Take 75 mg by mouth daily.     rosuvastatin  (CRESTOR ) 20 MG tablet TAKE 1 TABLET(20 MG) BY MOUTH DAILY 90 tablet 3   valsartan  (DIOVAN ) 160 MG tablet Take 1 tablet (160 mg total) by mouth daily. 90 tablet 3   amoxicillin  (AMOXIL ) 500 MG tablet Take 2,000 mg by mouth as directed. Take four (4) tablets by mouth 2000 mg 1 hour prior to dental procedures (Patient not taking: Reported on 12/26/2024)     No facility-administered medications prior to visit.    Allergies[1]     Objective:    BP 122/81   Pulse 67   Temp (!) 97.4 F (36.3 C)   Ht 5' 4 (1.626 m)   Wt 174 lb 3.2 oz (79 kg)   SpO2 97%   BMI 29.90 kg/m    Physical Exam Vitals and nursing note reviewed.  Constitutional:      Appearance: Normal appearance. She is normal weight.  HENT:     Head: Normocephalic and atraumatic.  Abdominal:     General: Bowel sounds are normal. There is no distension.     Palpations: Abdomen is soft.     Tenderness: There is no abdominal tenderness. There is no right CVA tenderness or left CVA tenderness.  Skin:    General: Skin is warm and dry.  Neurological:     General: No focal deficit present.     Mental Status: She is alert and oriented to person, place, and time. Mental status is at baseline.  Psychiatric:        Mood and Affect: Mood normal.        Behavior: Behavior normal.        Thought Content: Thought content normal.        Judgment: Judgment normal.       Results for orders placed or performed in visit on 12/26/24  Urinalysis, Routine w reflex microscopic  Result Value Ref Range   Color, Urine YELLOW YELLOW   APPearance CLOUDY (A) CLEAR   Specific Gravity, Urine 1.025 1.001 - 1.035   pH 5.5 5.0 -  8.0   Glucose, UA NEGATIVE NEGATIVE   Bilirubin Urine NEGATIVE NEGATIVE   Ketones, ur NEGATIVE NEGATIVE   Hgb urine dipstick 2+ (A) NEGATIVE   Protein, ur 2+ (A) NEGATIVE   Nitrite POSITIVE (A) NEGATIVE   Leukocytes,Ua 3+ (A) NEGATIVE   WBC, UA CANCELED   Microscopic Message  Result Value Ref Range   Note  Assessment & Plan:   Problem List Items Addressed This Visit       Genitourinary   Acute cystitis without hematuria - Primary   Relevant Orders   Urinalysis, Complete   Urine Culture   Other Visit Diagnoses       Dysuria       Relevant Orders   Urinalysis, Complete   Urine Culture   Urinalysis, Routine w reflex microscopic (Completed)       Assessment and Plan Assessment & Plan Acute cystitis Symptoms of dysuria, frequency, and cloudy urine for ten days. No fever, chills, or hematuria. No pyelonephritis signs.  - Prescribed Bactrim  for five days, twice daily. - Sent urine for culture to confirm antibiotic coverage. - Advised increased water  intake. - Instructed to report persistent or worsening symptoms.    Meds ordered this encounter  Medications   sulfamethoxazole -trimethoprim  (BACTRIM  DS) 800-160 MG tablet    Sig: Take 1 tablet by mouth 2 (two) times daily.    Dispense:  10 tablet    Refill:  0    Supervising Provider:   DUANNE LOWERS T [[id number]]    No follow-ups on file.  Jeoffrey GORMAN Barrio, FNP [facility]      [1]  Allergies Allergen Reactions   Augmentin  [Amoxicillin -Pot Clavulanate] Diarrhea    Did it involve swelling of the face/tongue/throat, SOB, or low BP? yes Did it involve sudden or severe rash/hives, skin peeling, or any reaction on the inside of your mouth or nose? no Did you need to seek medical attention at a hospital or doctor's office? no When did it last happen?      2000 If all above answers are NO, may proceed with cephalosporin use.    Niacin-Lovastatin Er      Flushing,itching,syncope   Kenalog [Triamcinolone ] Palpitations   "

## 2024-12-28 NOTE — Telephone Encounter (Signed)
 Message sent to provider for review

## 2024-12-29 LAB — URINE CULTURE
MICRO NUMBER:: 17431688
SPECIMEN QUALITY:: ADEQUATE

## 2025-01-02 ENCOUNTER — Encounter: Payer: Self-pay | Admitting: Family Medicine

## 2025-01-02 ENCOUNTER — Other Ambulatory Visit: Payer: Self-pay | Admitting: Family Medicine

## 2025-01-02 MED ORDER — SULFAMETHOXAZOLE-TRIMETHOPRIM 800-160 MG PO TABS: 1.0000 | ORAL_TABLET | Freq: Two times a day (BID) | ORAL | 0 refills | Status: DC

## 2025-01-18 ENCOUNTER — Ambulatory Visit

## 2025-01-18 VITALS — Ht 64.0 in | Wt 174.0 lb

## 2025-01-18 DIAGNOSIS — Z0000 Encounter for general adult medical examination without abnormal findings: Secondary | ICD-10-CM

## 2025-01-18 DIAGNOSIS — Z780 Asymptomatic menopausal state: Secondary | ICD-10-CM

## 2025-01-18 NOTE — Patient Instructions (Addendum)
 Heather Hoover,  Thank you for taking the time for your Medicare Wellness Visit. I appreciate your continued commitment to your health goals. Please review the care plan we discussed, and feel free to reach out if I can assist you further.  Please note that Annual Wellness Visits do not include a physical exam. Some assessments may be limited, especially if the visit was conducted virtually. If needed, we may recommend an in-person follow-up with your provider.  Ongoing Care Seeing your primary care provider every 3 to 6 months helps us  monitor your health and provide consistent, personalized care.   Referrals If a referral was made during today's visit and you haven't received any updates within two weeks, please contact the referred provider directly to check on the status.  You have an order for:  []   2D Mammogram  []   3D Mammogram  [x]   Bone Density     Please call for appointment:   [facility] Bone Density [address] [place], [place] [address] [phone]   Make sure to wear two-piece clothing.  No lotions, powders, or deodorants the day of the appointment. Make sure to bring picture ID and insurance card.  Bring list of medications you are currently taking including any supplements.   Recommended Screenings:  Health Maintenance  Topic Date Due   COVID-19 Vaccine (7 - 2025-26 season) 04/03/2025   Breast Cancer Screening  12/18/2025   Medicare Annual Wellness Visit  01/18/2026   DTaP/Tdap/Td vaccine (4 - Td or Tdap) 02/11/2029   Pneumococcal Vaccine for age over 55  Completed   Flu Shot  Completed   Osteoporosis screening with Bone Density Scan  Completed   Hepatitis C Screening  Completed   Zoster (Shingles) Vaccine  Completed   Meningitis B Vaccine  Aged Out   Colon Cancer Screening  Discontinued       01/18/2025   11:38 AM  Advanced Directives  Does Patient Have a Medical Advance Directive? No  Would patient like information on creating a  medical advance directive? Yes (MAU/Ambulatory/Procedural Areas - Information given)   Information on Advanced Care Planning can be found at [place]  Secret[facility] Advance Health Care Directives Advance Health Care Directives ([email or web address]   Vision: Annual vision screenings are recommended for early detection of glaucoma, cataracts, and diabetic retinopathy. These exams can also reveal signs of chronic conditions such as diabetes and high blood pressure.  Dental: Annual dental screenings help detect early signs of oral cancer, gum disease, and other conditions linked to overall health, including heart disease and diabetes.  Please see the attached documents for additional preventive care recommendations.

## 2025-01-18 NOTE — Progress Notes (Signed)
 "  Chief Complaint  Patient presents with   Medicare Wellness     Subjective:   Heather Hoover is a 79 y.o. female who presents for a Medicare Annual Wellness Visit.  Visit info / Clinical Intake: Medicare Wellness Visit Type:: Subsequent Annual Wellness Visit Persons participating in visit and providing information:: patient Medicare Wellness Visit Mode:: Telephone If telephone:: video declined Since this visit was completed virtually, some vitals may be partially provided or unavailable. Missing vitals are due to the limitations of the virtual format.: Documented vitals are patient reported If Telephone or Video please confirm:: I connected with patient using audio/video enable telemedicine. I verified patient identity with two identifiers, discussed telehealth limitations, and patient agreed to proceed. Patient Location:: home Provider Location:: home office Interpreter Needed?: No Pre-visit prep was completed: yes AWV questionnaire completed by patient prior to visit?: yes (SDOH) Date:: 01/14/25 Living arrangements:: lives with spouse/significant other Patient's Overall Health Status Rating: very good Typical amount of pain: some Does pain affect daily life?: no Are you currently prescribed opioids?: no  Dietary Habits and Nutritional Risks How many meals a day?: 3 Eats fruit and vegetables daily?: yes Most meals are obtained by: preparing own meals In the last 2 weeks, have you had any of the following?: none Diabetic:: no  Functional Status Activities of Daily Living (to include ambulation/medication): Independent Ambulation: Independent Medication Administration: Independent Home Management (perform basic housework or laundry): Independent Manage your own finances?: yes Primary transportation is: driving Concerns about vision?: no *vision screening is required for WTM* Concerns about hearing?: no Uses hearing aids?: (!) yes (followed by audiology)  Fall  Screening Falls in the past year?: 0 Number of falls in past year: 0 Was there an injury with Fall?: 0 Fall Risk Category Calculator: 0 Patient Fall Risk Level: Low Fall Risk  Fall Risk Patient at Risk for Falls Due to: No Fall Risks Fall risk Follow up: Falls evaluation completed; Education provided; Falls prevention discussed  Home and Transportation Safety: All rugs have non-skid backing?: yes All stairs or steps have railings?: yes Grab bars in the bathtub or shower?: yes Have non-skid surface in bathtub or shower?: yes Good home lighting?: yes Regular seat belt use?: yes Hospital stays in the last year:: no  Cognitive Assessment Difficulty concentrating, remembering, or making decisions? : no Will 6CIT or Mini Cog be Completed: no 6CIT or Mini Cog Declined: patient alert, oriented, able to answer questions appropriately and recall recent events  Advance Directives (For Healthcare) Does Patient Have a Medical Advance Directive?: No Would patient like information on creating a medical advance directive?: Yes (MAU/Ambulatory/Procedural Areas - Information given)  Reviewed/Updated  Reviewed/Updated: Reviewed All (Medical, Surgical, Family, Medications, Allergies, Care Teams, Patient Goals)    Allergies (verified) Augmentin  [amoxicillin -pot clavulanate], Niacin-lovastatin er, and Kenalog [triamcinolone ]   Current Medications (verified) Outpatient Encounter Medications as of 01/18/2025  Medication Sig   acetaminophen  (TYLENOL ) 325 MG tablet Take 650 mg by mouth every 6 (six) hours as needed (pain.).   alendronate  (FOSAMAX ) 70 MG tablet Take 1 tablet (70 mg total) by mouth every 7 (seven) days. Take with a full glass of water  on an empty stomach.   amoxicillin  (AMOXIL ) 500 MG tablet Take 2,000 mg by mouth as directed. Take four (4) tablets by mouth 2000 mg 1 hour prior to dental procedures   carboxymethylcellulose (REFRESH PLUS) 0.5 % SOLN Place 1 drop into both eyes 3 (three)  times daily as needed (dry/irritated eyes.).   carvedilol  (COREG )  3.125 MG tablet Take 1 tablet (3.125 mg total) by mouth 2 (two) times daily.   Cholecalciferol (VITAMIN D-3 PO) Take 2,000 Units by mouth in the morning.   clotrimazole (LOTRIMIN) 1 % external solution Apply 1 Application topically 2 (two) times daily as needed (toe nail fungus).   cycloSPORINE (RESTASIS) 0.05 % ophthalmic emulsion Place 1 drop into both eyes 2 (two) times daily.   meloxicam  (MOBIC ) 15 MG tablet Take 1 tablet (15 mg total) by mouth daily.   PREVIDENT 5000 DRY MOUTH 1.1 % GEL dental gel Place 1 Application onto teeth in the morning.   raNITIdine HCl (RANITIDINE 75 PO) Take 75 mg by mouth daily.   rosuvastatin  (CRESTOR ) 20 MG tablet TAKE 1 TABLET(20 MG) BY MOUTH DAILY   valsartan  (DIOVAN ) 160 MG tablet Take 1 tablet (160 mg total) by mouth daily.   [DISCONTINUED] sulfamethoxazole -trimethoprim  (BACTRIM  DS) 800-160 MG tablet Take 1 tablet by mouth 2 (two) times daily.   No facility-administered encounter medications on file as of 01/18/2025.    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 10/28/2010   echo   Neuromuscular disorder (HCC) 2020   Carpal tunnel surgery 01/2021 (left)05/2021(rt)   Osteopenia 12/2018   T score -1.6 FRAX 10% / 1.5%   Past Surgical History:  Procedure Laterality Date   ABDOMINAL HYSTERECTOMY  02/07/1981   RSO, left one ovary   APPENDECTOMY     BREAST SURGERY     CARDIAC CATHETERIZATION     when in 3rd grade and age 14   CARPAL TUNNEL RELEASE Left 02/06/2021   Procedure: LEFT CARPAL TUNNEL RELEASE;   Surgeon: Murrell Kuba, MD;  Location: [facility];  Service: Orthopedics;  Laterality: Left;  IV REGIONAL FOREARM BLOCK 45 MINUTES   CARPAL TUNNEL RELEASE Right 06/03/2021   Procedure: RIGHT CARPAL TUNNEL RELEASE;  Surgeon: Murrell Kuba, MD;  Location: [facility];  Service: Orthopedics;  Laterality: Right;  45 MIN   COSMETIC SURGERY     EYE SURGERY     forehead plastic surgery     MOH's surgery   FRACTURE SURGERY     right arm   JOINT REPLACEMENT     KNEE ARTHROPLASTY Right 09/01/2023   Procedure: COMPUTER ASSISTED TOTAL KNEE ARTHROPLASTY;  Surgeon: Fidel Rogue, MD;  Location: [facility];  Service: Orthopedics;  Laterality: Right;  160   KNEE ARTHROSCOPY     right   Family History  Problem Relation Age of Onset   Hypertension Mother    Breast cancer Mother 28       Breast    Cancer Mother        uterine thye think   Hearing loss Mother    Varicose Veins Mother    Cancer Father 56       prostate metastized to bone   Hypertension Father    Arthritis Father    Hypertension Sister    Breast cancer Sister 62   Cancer Sister 30       uterine  Heart failure Sister    Cancer Brother    Muscular dystrophy Brother    Miscarriages / Stillbirths Daughter    Social History   Occupational History   Not on file  Tobacco Use   Smoking status: Never   Smokeless tobacco: Never  Vaping Use   Vaping status: Never Used  Substance and Sexual Activity   Alcohol  use: Yes    Comment: Occas   Drug use: Never   Sexual activity: Not Currently    Birth control/protection: Post-menopausal    Comment: 1st intercourse 24 yo-1 partner   Tobacco Counseling Counseling given: Not Answered  SDOH Screenings   Food Insecurity: No Food Insecurity (01/14/2025)  Housing: Low Risk (01/14/2025)  Transportation Needs: No Transportation Needs (01/14/2025)  Utilities: Not At Risk (01/18/2025)  Alcohol  Screen: Low Risk (01/14/2025)  Depression (PHQ2-9): Low Risk (01/18/2025)   Financial Resource Strain: Low Risk (01/14/2025)  Physical Activity: Insufficiently Active (01/14/2025)  Social Connections: Moderately Integrated (01/14/2025)  Stress: Stress Concern Present (01/14/2025)  Tobacco Use: Low Risk (01/18/2025)  Health Literacy: Adequate Health Literacy (01/18/2025)   See flowsheets for full screening details  Depression Screen PHQ 2 & 9 Depression Scale- Over the past 2 weeks, how often have you been bothered by any of the following problems? Little interest or pleasure in doing things: 0 Feeling down, depressed, or hopeless (PHQ Adolescent also includes...irritable): 0 PHQ-2 Total Score: 0     Goals Addressed             This Visit's Progress    Remain active and independent   On track            Objective:    Today's Vitals   01/18/25 1129  Weight: 174 lb (78.9 kg)  Height: 5' 4 (1.626 m)   Body mass index is 29.87 kg/m.  Hearing/Vision screen No results found. Immunizations and Health Maintenance Health Maintenance  Topic Date Due   COVID-19 Vaccine (7 - 2025-26 season) 04/03/2025   Mammogram  12/18/2025   Medicare Annual Wellness (AWV)  01/18/2026   DTaP/Tdap/Td (4 - Td or Tdap) 02/11/2029   Pneumococcal Vaccine: 50+ Years  Completed   Influenza Vaccine  Completed   Bone Density Scan  Completed   Hepatitis C Screening  Completed   Zoster Vaccines- Shingrix  Completed   Meningococcal B Vaccine  Aged Out   Colonoscopy  Discontinued        Assessment/Plan:  This is a routine wellness examination for Heather Hoover.  Patient Care Team: Duanne Butler DASEN, MD as PCP - General (Family Medicine) Raford Riggs, MD as PCP - Cardiology (Cardiology) Duanne Butler DASEN, MD (Family Medicine) [facility] Cleotilde Ronal RAMAN, MD as Consulting Physician (Obstetrics and Gynecology) Fidel Rogue, MD as Consulting Physician (Orthopedic Surgery) Court Pulling, MD as Referring Physician (Dermatology) Jinx Grate,  AUD (Audiology)  I have personally reviewed and noted the following in the patients chart:   Medical and social history Use of alcohol , tobacco or illicit drugs  Current medications and supplements including opioid prescriptions. Functional ability and status Nutritional status Physical activity Advanced directives List of other physicians Hospitalizations, surgeries, and ER visits in previous 12 months Vitals Screenings to include cognitive, depression, and falls Referrals and appointments  Orders Placed This Encounter  Procedures   DG Bone Density    Standing Status:   Future    Expiration Date:   01/18/2026    Reason for Exam (SYMPTOM  OR DIAGNOSIS REQUIRED):   post menopausal  Preferred imaging location?:   [facility]   In addition, I have reviewed and discussed with patient certain preventive protocols, quality metrics, and best practice recommendations. A written personalized care plan for preventive services as well as general preventive health recommendations were provided to patient.   Lavelle Charmaine Browner, LPN   [id number]/[id number]   Return in 1 year (on 01/18/2026).  After Visit Summary: (MyChart) Due to this being a telephonic visit, the after visit summary with patients personalized plan was offered to patient via MyChart   Nurse Notes: No voiced or noted concerns at this time Patient advised to keep follow-up appointment with PCP (08/13/25)  "

## 2025-05-23 ENCOUNTER — Other Ambulatory Visit (HOSPITAL_BASED_OUTPATIENT_CLINIC_OR_DEPARTMENT_OTHER)

## 2025-08-06 ENCOUNTER — Other Ambulatory Visit

## 2025-08-13 ENCOUNTER — Encounter: Admitting: Family Medicine

## 2025-08-29 ENCOUNTER — Other Ambulatory Visit (HOSPITAL_BASED_OUTPATIENT_CLINIC_OR_DEPARTMENT_OTHER)
